# Patient Record
Sex: Male | Born: 1950 | Race: White | Hispanic: No | Marital: Married | Smoking: Former smoker
Health system: Southern US, Community
[De-identification: ages and names within clinical notes are randomized; demographics above are authoritative.]

## PROBLEM LIST (undated history)

## (undated) DIAGNOSIS — J439 Emphysema, unspecified: Secondary | ICD-10-CM

## (undated) DIAGNOSIS — I714 Abdominal aortic aneurysm, without rupture, unspecified: Secondary | ICD-10-CM

## (undated) DIAGNOSIS — H269 Unspecified cataract: Secondary | ICD-10-CM

## (undated) DIAGNOSIS — G610 Guillain-Barre syndrome: Secondary | ICD-10-CM

## (undated) HISTORY — PX: FOOT SURGERY: SHX648

## (undated) HISTORY — DX: Emphysema, unspecified: J43.9

## (undated) HISTORY — PX: KNEE ARTHROSCOPY: SHX127

## (undated) HISTORY — DX: Guillain-Barre syndrome: G61.0

## (undated) HISTORY — DX: Unspecified cataract: H26.9

## (undated) HISTORY — DX: Abdominal aortic aneurysm, without rupture, unspecified: I71.40

## (undated) HISTORY — PX: KNEE SURGERY: SHX244

## (undated) HISTORY — PX: WISDOM TOOTH EXTRACTION: SHX21

## (undated) HISTORY — PX: HERNIA REPAIR: SHX51

---

## 2020-09-01 ENCOUNTER — Other Ambulatory Visit: Payer: Self-pay

## 2020-09-02 ENCOUNTER — Ambulatory Visit (INDEPENDENT_AMBULATORY_CARE_PROVIDER_SITE_OTHER): Payer: Medicare PPO | Admitting: Family Medicine

## 2020-09-02 ENCOUNTER — Encounter: Payer: Self-pay | Admitting: Family Medicine

## 2020-09-02 VITALS — BP 144/86 | HR 69 | Temp 97.9°F | Ht 70.0 in | Wt 212.0 lb

## 2020-09-02 DIAGNOSIS — Z1211 Encounter for screening for malignant neoplasm of colon: Secondary | ICD-10-CM | POA: Insufficient documentation

## 2020-09-02 DIAGNOSIS — Z720 Tobacco use: Secondary | ICD-10-CM | POA: Diagnosis not present

## 2020-09-02 DIAGNOSIS — F17201 Nicotine dependence, unspecified, in remission: Secondary | ICD-10-CM | POA: Insufficient documentation

## 2020-09-02 DIAGNOSIS — Z0000 Encounter for general adult medical examination without abnormal findings: Secondary | ICD-10-CM

## 2020-09-02 DIAGNOSIS — Z23 Encounter for immunization: Secondary | ICD-10-CM | POA: Diagnosis not present

## 2020-09-02 DIAGNOSIS — R030 Elevated blood-pressure reading, without diagnosis of hypertension: Secondary | ICD-10-CM

## 2020-09-02 DIAGNOSIS — F17200 Nicotine dependence, unspecified, uncomplicated: Secondary | ICD-10-CM | POA: Insufficient documentation

## 2020-09-02 DIAGNOSIS — Z122 Encounter for screening for malignant neoplasm of respiratory organs: Secondary | ICD-10-CM

## 2020-09-02 LAB — URINALYSIS, ROUTINE W REFLEX MICROSCOPIC
Bilirubin Urine: NEGATIVE
Hgb urine dipstick: NEGATIVE
Ketones, ur: NEGATIVE
Leukocytes,Ua: NEGATIVE
Nitrite: NEGATIVE
RBC / HPF: NONE SEEN (ref 0–?)
Specific Gravity, Urine: 1.025 (ref 1.000–1.030)
Total Protein, Urine: NEGATIVE
Urine Glucose: NEGATIVE
Urobilinogen, UA: 0.2 (ref 0.0–1.0)
pH: 6 (ref 5.0–8.0)

## 2020-09-02 LAB — LIPID PANEL
Cholesterol: 185 mg/dL (ref 0–200)
HDL: 32.3 mg/dL — ABNORMAL LOW (ref >39.00)
NonHDL: 153.09
Total CHOL/HDL Ratio: 6
Triglycerides: 301 mg/dL — ABNORMAL HIGH (ref 0.0–149.0)
VLDL: 60.2 mg/dL — ABNORMAL HIGH (ref 0.0–40.0)

## 2020-09-02 LAB — COMPREHENSIVE METABOLIC PANEL
ALT: 29 U/L (ref 0–53)
AST: 18 U/L (ref 0–37)
Albumin: 4.4 g/dL (ref 3.5–5.2)
Alkaline Phosphatase: 51 U/L (ref 39–117)
BUN: 18 mg/dL (ref 6–23)
CO2: 25 mEq/L (ref 19–32)
Calcium: 9.2 mg/dL (ref 8.4–10.5)
Chloride: 103 mEq/L (ref 96–112)
Creatinine, Ser: 0.85 mg/dL (ref 0.40–1.50)
GFR: 88.52 mL/min (ref >60.00)
Glucose, Bld: 107 mg/dL — ABNORMAL HIGH (ref 70–99)
Potassium: 4.5 mEq/L (ref 3.5–5.1)
Sodium: 136 mEq/L (ref 135–145)
Total Bilirubin: 0.7 mg/dL (ref 0.2–1.2)
Total Protein: 6.6 g/dL (ref 6.0–8.3)

## 2020-09-02 LAB — CBC
HCT: 47.5 % (ref 39.0–52.0)
Hemoglobin: 16.1 g/dL (ref 13.0–17.0)
MCHC: 33.9 g/dL (ref 30.0–36.0)
MCV: 97.2 fl (ref 78.0–100.0)
Platelets: 231 10*3/uL (ref 150.0–400.0)
RBC: 4.89 Mil/uL (ref 4.22–5.81)
RDW: 13.5 % (ref 11.5–15.5)
WBC: 7.2 10*3/uL (ref 4.0–10.5)

## 2020-09-02 LAB — LDL CHOLESTEROL, DIRECT: Direct LDL: 104 mg/dL

## 2020-09-02 LAB — PSA: PSA: 0.8 ng/mL (ref 0.10–4.00)

## 2020-09-02 NOTE — Patient Instructions (Addendum)
Coping with Quitting Smoking  Quitting smoking is a physical and mental challenge. You will face cravings, withdrawal symptoms, and temptation. Before quitting, work with your health care provider to make a plan that can help you cope. Preparation can help you quit and keep you from giving in. How can I cope with cravings? Cravings usually last for 5-10 minutes. If you get through it, the craving will pass. Consider taking the following actions to help you cope with cravings:  Keep your mouth busy: ? Chew sugar-free gum. ? Suck on hard candies or a straw. ? Brush your teeth.  Keep your hands and body busy: ? Immediately change to a different activity when you feel a craving. ? Squeeze or play with a ball. ? Do an activity or a hobby, like making bead jewelry, practicing needlepoint, or working with wood. ? Mix up your normal routine. ? Take a short exercise break. Go for a quick walk or run up and down stairs. ? Spend time in public places where smoking is not allowed.  Focus on doing something kind or helpful for someone else.  Call a friend or family member to talk during a craving.  Join a support group.  Call a quit line, such as 1-800-QUIT-NOW.  Talk with your health care provider about medicines that might help you cope with cravings and make quitting easier for you. How can I deal with withdrawal symptoms? Your body may experience negative effects as it tries to get used to not having nicotine in the system. These effects are called withdrawal symptoms. They may include:  Feeling hungrier than normal.  Trouble concentrating.  Irritability.  Trouble sleeping.  Feeling depressed.  Restlessness and agitation.  Craving a cigarette. To manage withdrawal symptoms:  Avoid places, people, and activities that trigger your cravings.  Remember why you want to quit.  Get plenty of sleep.  Avoid coffee and other caffeinated drinks. These may worsen some of your  symptoms. How can I handle social situations? Social situations can be difficult when you are quitting smoking, especially in the first few weeks. To manage this, you can:  Avoid parties, bars, and other social situations where people might be smoking.  Avoid alcohol.  Leave right away if you have the urge to smoke.  Explain to your family and friends that you are quitting smoking. Ask for understanding and support.  Plan activities with friends or family where smoking is not an option. What are some ways I can cope with stress? Wanting to smoke may cause stress, and stress can make you want to smoke. Find ways to manage your stress. Relaxation techniques can help. For example:  Breathe slowly and deeply, in through your nose and out through your mouth.  Listen to soothing, relaxing music.  Talk with a family member or friend about your stress.  Light a candle.  Soak in a bath or take a shower.  Think about a peaceful place. What are some ways I can prevent weight gain? Be aware that many people gain weight after they quit smoking. However, not everyone does. To keep from gaining weight, have a plan in place before you quit and stick to the plan after you quit. Your plan should include:  Having healthy snacks. When you have a craving, it may help to: ? Eat plain popcorn, crunchy carrots, celery, or other cut vegetables. ? Chew sugar-free gum.  Changing how you eat: ? Eat small portion sizes at meals. ? Eat 4-6 small meals  throughout the day instead of 1-2 large meals a day. ? Be mindful when you eat. Do not watch television or do other things that might distract you as you eat.  Exercising regularly: ? Make time to exercise each day. If you do not have time for a long workout, do short bouts of exercise for 5-10 minutes several times a day. ? Do some form of strengthening exercise, like weight lifting, and some form of aerobic exercise, like running or swimming.  Drinking  plenty of water or other low-calorie or no-calorie drinks. Drink 6-8 glasses of water daily, or as much as instructed by your health care provider. Summary  Quitting smoking is a physical and mental challenge. You will face cravings, withdrawal symptoms, and temptation to smoke again. Preparation can help you as you go through these challenges.  You can cope with cravings by keeping your mouth busy (such as by chewing gum), keeping your body and hands busy, and making calls to family, friends, or a helpline for people who want to quit smoking.  You can cope with withdrawal symptoms by avoiding places where people smoke, avoiding drinks with caffeine, and getting plenty of rest.  Ask your health care provider about the different ways to prevent weight gain, avoid stress, and handle social situations. This information is not intended to replace advice given to you by your health care provider. Make sure you discuss any questions you have with your health care provider. Document Revised: 10/11/2017 Document Reviewed: 10/26/2016 Elsevier Patient Education  2020 [employer].  Health Maintenance After Age 71 After age 22, you are at a higher risk for certain long-term diseases and infections as well as injuries from falls. Falls are a major cause of broken bones and head injuries in people who are older than age 14. Getting regular preventive care can help to keep you healthy and well. Preventive care includes getting regular testing and making lifestyle changes as recommended by your health care provider. Talk with your health care provider about:  Which screenings and tests you should have. A screening is a test that checks for a disease when you have no symptoms.  A diet and exercise plan that is right for you. What should I know about screenings and tests to prevent falls? Screening and testing are the best ways to find a health problem early. Early diagnosis and treatment give you the best  chance of managing medical conditions that are common after age 3. Certain conditions and lifestyle choices may make you more likely to have a fall. Your health care provider may recommend:  Regular vision checks. Poor vision and conditions such as cataracts can make you more likely to have a fall. If you wear glasses, make sure to get your prescription updated if your vision changes.  Medicine review. Work with your health care provider to regularly review all of the medicines you are taking, including over-the-counter medicines. Ask your health care provider about any side effects that may make you more likely to have a fall. Tell your health care provider if any medicines that you take make you feel dizzy or sleepy.  Osteoporosis screening. Osteoporosis is a condition that causes the bones to get weaker. This can make the bones weak and cause them to break more easily.  Blood pressure screening. Blood pressure changes and medicines to control blood pressure can make you feel dizzy.  Strength and balance checks. Your health care provider may recommend certain tests to check your strength and  balance while standing, walking, or changing positions.  Foot health exam. Foot pain and numbness, as well as not wearing proper footwear, can make you more likely to have a fall.  Depression screening. You may be more likely to have a fall if you have a fear of falling, feel emotionally low, or feel unable to do activities that you used to do.  Alcohol use screening. Using too much alcohol can affect your balance and may make you more likely to have a fall. What actions can I take to lower my risk of falls? General instructions  Talk with your health care provider about your risks for falling. Tell your health care provider if: ? You fall. Be sure to tell your health care provider about all falls, even ones that seem minor. ? You feel dizzy, sleepy, or off-balance.  Take over-the-counter and  prescription medicines only as told by your health care provider. These include any supplements.  Eat a healthy diet and maintain a healthy weight. A healthy diet includes low-fat dairy products, low-fat (lean) meats, and fiber from whole grains, beans, and lots of fruits and vegetables. Home safety  Remove any tripping hazards, such as rugs, cords, and clutter.  Install safety equipment such as grab bars in bathrooms and safety rails on stairs.  Keep rooms and walkways well-lit. Activity   Follow a regular exercise program to stay fit. This will help you maintain your balance. Ask your health care provider what types of exercise are appropriate for you.  If you need a cane or walker, use it as recommended by your health care provider.  Wear supportive shoes that have nonskid soles. Lifestyle  Do not drink alcohol if your health care provider tells you not to drink.  If you drink alcohol, limit how much you have: ? 0-1 drink a day for women. ? 0-2 drinks a day for men.  Be aware of how much alcohol is in your drink. In the [place] one drink equals one typical bottle of beer (12 oz), one-half glass of wine (5 oz), or one shot of hard liquor (1 oz).  Do not use any products that contain nicotine or tobacco, such as cigarettes and e-cigarettes. If you need help quitting, ask your health care provider. Summary  Having a healthy lifestyle and getting preventive care can help to protect your health and wellness after age 59.  Screening and testing are the best way to find a health problem early and help you avoid having a fall. Early diagnosis and treatment give you the best chance for managing medical conditions that are more common for people who are older than age 72.  Falls are a major cause of broken bones and head injuries in people who are older than age 67. Take precautions to prevent a fall at home.  Work with your health care provider to learn what changes you can make to  improve your health and wellness and to prevent falls. This information is not intended to replace advice given to you by your health care provider. Make sure you discuss any questions you have with your health care provider. Document Revised: 02/19/2019 Document Reviewed: 09/11/2017 Elsevier Patient Education  [employer].  Preventing Hypertension Hypertension, commonly called high blood pressure, is when the force of blood pumping through the arteries is too strong. Arteries are blood vessels that carry blood from the heart throughout the body. Over time, hypertension can damage the arteries and decrease blood flow to important parts of  the body, including the brain, heart, and kidneys. Often, hypertension does not cause symptoms until blood pressure is very high. For this reason, it is important to have your blood pressure checked on a regular basis. Hypertension can often be prevented with diet and lifestyle changes. If you already have hypertension, you can control it with diet and lifestyle changes, as well as medicine. What nutrition changes can be made? Maintain a healthy diet. This includes:  Eating less salt (sodium). Ask your health care provider how much sodium is safe for you to have. The general recommendation is to consume less than 1 tsp (2,300 mg) of sodium a day. ? Do not add salt to your food. ? Choose low-sodium options when grocery shopping and eating out.  Limiting fats in your diet. You can do this by eating low-fat or fat-free dairy products and by eating less red meat.  Eating more fruits, vegetables, and whole grains. Make a goal to eat: ? 1-2 cups of fresh fruits and vegetables each day. ? 3-4 servings of whole grains each day.  Avoiding foods and beverages that have added sugars.  Eating fish that contain healthy fats (omega-3 fatty acids), such as mackerel or salmon. If you need help putting together a healthy eating plan, try the DASH diet. This diet is  high in fruits, vegetables, and whole grains. It is low in sodium, red meat, and added sugars. DASH stands for Dietary Approaches to Stop Hypertension. What lifestyle changes can be made?   Lose weight if you are overweight. Losing just 3?5% of your body weight can help prevent or control hypertension. ? For example, if your present weight is 200 lb (91 kg), a loss of 3-5% of your weight means losing 6-10 lb (2.7-4.5 kg). ? Ask your health care provider to help you with a diet and exercise plan to safely lose weight.  Get enough exercise. Do at least 150 minutes of moderate-intensity exercise each week. ? You could do this in short exercise sessions several times a day, or you could do longer exercise sessions a few times a week. For example, you could take a brisk 10-minute walk or bike ride, 3 times a day, for 5 days a week.  Find ways to reduce stress, such as exercising, meditating, listening to music, or taking a yoga class. If you need help reducing stress, ask your health care provider.  Do not smoke. This includes e-cigarettes. Chemicals in tobacco and nicotine products raise your blood pressure each time you smoke. If you need help quitting, ask your health care provider.  Avoid alcohol. If you drink alcohol, limit alcohol intake to no more than 1 drink a day for nonpregnant women and 2 drinks a day for men. One drink equals 12 oz of beer, 5 oz of wine, or 1 oz of hard liquor. Why are these changes important? Diet and lifestyle changes can help you prevent hypertension, and they may make you feel better overall and improve your quality of life. If you have hypertension, making these changes will help you control it and help prevent major complications, such as:  Hardening and narrowing of arteries that supply blood to: ? Your heart. This can cause a heart attack. ? Your brain. This can cause a stroke. ? Your kidneys. This can cause kidney failure.  Stress on your heart muscle, which  can cause heart failure. What can I do to lower my risk?  Work with your health care provider to make a hypertension prevention  plan that works for you. Follow your plan and keep all follow-up visits as told by your health care provider.  Learn how to check your blood pressure at home. Make sure that you know your personal target blood pressure, as told by your health care provider. How is this treated? In addition to diet and lifestyle changes, your health care provider may recommend medicines to help lower your blood pressure. You may need to try a few different medicines to find what works best for you. You also may need to take more than one medicine. Take over-the-counter and prescription medicines only as told by your health care provider. Where to find support Your health care provider can help you prevent hypertension and help you keep your blood pressure at a healthy level. Your local hospital or your community may also provide support services and prevention programs. The American Heart Association offers an online support network at: CheapBootlegs.com.cy Where to find more information Learn more about hypertension from:  [place], [facility]: ElectronicHangman.is  Centers for Disease Control and Prevention: https://ingram.com/  American Academy of Family Physicians: http://familydoctor.org/familydoctor/en/diseases-conditions/high-blood-pressure.printerview.all.html Learn more about the DASH diet from:  [place], and [place]: https://www.reyes.com/ Contact a health care provider if:  You think you are having a reaction to medicines you have taken.  You have recurrent headaches or feel dizzy.  You have swelling in your ankles.  You have trouble with your vision. Summary  Hypertension often does not cause any symptoms until blood pressure is  very high. It is important to get your blood pressure checked regularly.  Diet and lifestyle changes are the most important steps in preventing hypertension.  By keeping your blood pressure in a healthy range, you can prevent complications like heart attack, heart failure, stroke, and kidney failure.  Work with your health care provider to make a hypertension prevention plan that works for you. This information is not intended to replace advice given to you by your health care provider. Make sure you discuss any questions you have with your health care provider. Document Revised: 02/20/2019 Document Reviewed: 07/09/2016 Elsevier Patient Education  [place] 65 Years and Older, Male Preventive care refers to lifestyle choices and visits with your health care provider that can promote health and wellness. This includes:  A yearly physical exam. This is also called an annual well check.  Regular dental and eye exams.  Immunizations.  Screening for certain conditions.  Healthy lifestyle choices, such as diet and exercise. What can I expect for my preventive care visit? Physical exam Your health care provider will check:  Height and weight. These may be used to calculate body mass index (BMI), which is a measurement that tells if you are at a healthy weight.  Heart rate and blood pressure.  Your skin for abnormal spots. Counseling Your health care provider may ask you questions about:  Alcohol, tobacco, and drug use.  Emotional well-being.  Home and relationship well-being.  Sexual activity.  Eating habits.  History of falls.  Memory and ability to understand (cognition).  Work and work Statistician. What immunizations do I need?  Influenza (flu) vaccine  This is recommended every year. Tetanus, diphtheria, and pertussis (Tdap) vaccine  You may need a Td booster every 10 years. Varicella (chickenpox) vaccine  You may need this vaccine  if you have not already been vaccinated. Zoster (shingles) vaccine  You may need this after age 48. Pneumococcal conjugate (PCV13) vaccine  One  dose is recommended after age 38. Pneumococcal polysaccharide (PPSV23) vaccine  One dose is recommended after age 21. Measles, mumps, and rubella (MMR) vaccine  You may need at least one dose of MMR if you were born in 1957 or later. You may also need a second dose. Meningococcal conjugate (MenACWY) vaccine  You may need this if you have certain conditions. Hepatitis A vaccine  You may need this if you have certain conditions or if you travel or work in places where you may be exposed to hepatitis A. Hepatitis B vaccine  You may need this if you have certain conditions or if you travel or work in places where you may be exposed to hepatitis B. Haemophilus influenzae type b (Hib) vaccine  You may need this if you have certain conditions. You may receive vaccines as individual doses or as more than one vaccine together in one shot (combination vaccines). Talk with your health care provider about the risks and benefits of combination vaccines. What tests do I need? Blood tests  Lipid and cholesterol levels. These may be checked every 5 years, or more frequently depending on your overall health.  Hepatitis C test.  Hepatitis B test. Screening  Lung cancer screening. You may have this screening every year starting at age 68 if you have a 30-pack-year history of smoking and currently smoke or have quit within the past 15 years.  Colorectal cancer screening. All adults should have this screening starting at age 34 and continuing until age 89. Your health care provider may recommend screening at age 99 if you are at increased risk. You will have tests every 1-10 years, depending on your results and the type of screening test.  Prostate cancer screening. Recommendations will vary depending on your family history and other risks.  Diabetes  screening. This is done by checking your blood sugar (glucose) after you have not eaten for a while (fasting). You may have this done every 1-3 years.  Abdominal aortic aneurysm (AAA) screening. You may need this if you are a current or former smoker.  Sexually transmitted disease (STD) testing. Follow these instructions at home: Eating and drinking  Eat a diet that includes fresh fruits and vegetables, whole grains, lean protein, and low-fat dairy products. Limit your intake of foods with high amounts of sugar, saturated fats, and salt.  Take vitamin and mineral supplements as recommended by your health care provider.  Do not drink alcohol if your health care provider tells you not to drink.  If you drink alcohol: ? Limit how much you have to 0-2 drinks a day. ? Be aware of how much alcohol is in your drink. In the [place] one drink equals one 12 oz bottle of beer (355 mL), one 5 oz glass of wine (148 mL), or one 1 oz glass of hard liquor (44 mL). Lifestyle  Take daily care of your teeth and gums.  Stay active. Exercise for at least 30 minutes on 5 or more days each week.  Do not use any products that contain nicotine or tobacco, such as cigarettes, e-cigarettes, and chewing tobacco. If you need help quitting, ask your health care provider.  If you are sexually active, practice safe sex. Use a condom or other form of protection to prevent STIs (sexually transmitted infections).  Talk with your health care provider about taking a low-dose aspirin or statin. What's next?  Visit your health care provider once a year for a well check visit.  Ask your health care  provider how often you should have your eyes and teeth checked.  Stay up to date on all vaccines. This information is not intended to replace advice given to you by your health care provider. Make sure you discuss any questions you have with your health care provider. Document Revised: 10/23/2018 Document Reviewed:  10/23/2018 Elsevier Patient Education  2020 [employer].

## 2020-09-02 NOTE — Progress Notes (Signed)
New Patient Office Visit  Subjective:  Patient ID: Joe Greene, male    DOB: October 04, 1951  Age: 69 y.o. MRN: [id number]  CC:  Chief Complaint  Patient presents with  . Establish Care    New patient, CPE no concerns.     HPI Joe Greene presents for establishment of care and follow-up on health maintenance.  He is smoked for all his adult life.  He has tried to quit but has been unable to do so.  He has never had a colonoscopy.  He has no history of hypertension.  Urine flow is okay but does experience nocturia x1.  No problems moving bowels.  He and his wife recently moved into the area from [place].  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  History reviewed. No pertinent past medical history.  Past Surgical History:  Procedure Laterality Date  . FOOT SURGERY Right   . HERNIA REPAIR    . KNEE SURGERY      History reviewed. No pertinent family history.  Social History   Socioeconomic History  . Marital status: Married    Spouse name: Not on file  . Number of children: Not on file  . Years of education: Not on file  . Highest education level: Not on file  Occupational History  . Not on file  Tobacco Use  . Smoking status: Current Every Day Smoker  . Smokeless tobacco: Never Used  Vaping Use  . Vaping Use: Never used  Substance and Sexual Activity  . Alcohol use: Yes    Alcohol/week: 1.0 standard drink    Types: 1 Standard drinks or equivalent per week    Comment: occ  . Drug use: Never  . Sexual activity: Yes  Other Topics Concern  . Not on file  Social History Narrative  . Not on file   Social Determinants of Health   Financial Resource Strain:   . Difficulty of Paying Living Expenses: Not on file  Food Insecurity:   . Worried About Programme researcher, broadcasting/film/video in the Last Year: Not on file  . Ran Out of Food in the Last Year: Not on file  Transportation Needs:   . Lack of Transportation (Medical): Not on file  . Lack of Transportation (Non-Medical): Not on  file  Physical Activity:   . Days of Exercise per Week: Not on file  . Minutes of Exercise per Session: Not on file  Stress:   . Feeling of Stress : Not on file  Social Connections:   . Frequency of Communication with Friends and Family: Not on file  . Frequency of Social Gatherings with Friends and Family: Not on file  . Attends Religious Services: Not on file  . Active Member of Clubs or Organizations: Not on file  . Attends Banker Meetings: Not on file  . Marital Status: Not on file  Intimate Partner Violence:   . Fear of Current or Ex-Partner: Not on file  . Emotionally Abused: Not on file  . Physically Abused: Not on file  . Sexually Abused: Not on file    ROS Review of Systems  Constitutional: Negative.   HENT: Negative.   Eyes: Negative for photophobia and visual disturbance.  Respiratory: Negative for cough and shortness of breath.   Cardiovascular: Negative.   Gastrointestinal: Negative for anal bleeding and blood in stool.  Endocrine: Negative for polyphagia and polyuria.  Genitourinary: Negative for difficulty urinating, frequency and urgency.  Musculoskeletal: Negative for gait  problem and joint swelling.  Skin: Negative for pallor.  Allergic/Immunologic: Negative for immunocompromised state.  Neurological: Negative for speech difficulty and light-headedness.  Hematological: Does not bruise/bleed easily.  Psychiatric/Behavioral: Negative.     Objective:   Today's Vitals: BP (!) 144/86   Pulse 69   Temp 97.9 F (36.6 C) (Tympanic)   Ht 5\' 10"  (1.778 m)   Wt 212 lb (96.2 kg)   SpO2 94%   BMI 30.42 kg/m   Physical Exam Vitals and nursing note reviewed.  Constitutional:      General: He is not in acute distress.    Appearance: Normal appearance. He is not ill-appearing, toxic-appearing or diaphoretic.  HENT:     Head: Normocephalic and atraumatic.     Right Ear: Tympanic membrane, ear canal and external ear normal.     Left Ear: Tympanic  membrane, ear canal and external ear normal.     Mouth/Throat:     Mouth: Mucous membranes are moist.     Pharynx: Oropharynx is clear. No oropharyngeal exudate or posterior oropharyngeal erythema.  Eyes:     General: No scleral icterus.       Right eye: No discharge.        Left eye: No discharge.     Extraocular Movements: Extraocular movements intact.     Conjunctiva/sclera: Conjunctivae normal.     Pupils: Pupils are equal, round, and reactive to light.  Cardiovascular:     Rate and Rhythm: Normal rate and regular rhythm.  Pulmonary:     Effort: Pulmonary effort is normal. No respiratory distress.     Breath sounds: Normal breath sounds. No stridor. No wheezing or rhonchi.  Abdominal:     General: Bowel sounds are normal. There is no distension.     Palpations: Abdomen is soft. There is no mass.     Tenderness: There is no abdominal tenderness. There is no guarding or rebound.     Hernia: No hernia is present. There is no hernia in the left inguinal area or right inguinal area.  Genitourinary:    Penis: Circumcised. No hypospadias, erythema, tenderness, discharge, swelling or lesions.      Testes:        Right: Mass, tenderness or swelling not present. Right testis is descended.        Left: Mass, tenderness or swelling not present. Left testis is descended.     Epididymis:     Right: Not inflamed or enlarged.     Left: Not inflamed or enlarged.     Prostate: Not enlarged, not tender and no nodules present.     Rectum: Guaiac result negative. No mass, tenderness, anal fissure, external hemorrhoid or internal hemorrhoid. Normal anal tone.  Musculoskeletal:     Cervical back: No rigidity or tenderness.     Right lower leg: No edema.     Left lower leg: No edema.  Lymphadenopathy:     Cervical: No cervical adenopathy.     Lower Body: No right inguinal adenopathy. No left inguinal adenopathy.  Skin:    General: Skin is warm and dry.     Coloration: Skin is not jaundiced.   Neurological:     Mental Status: He is alert and oriented to person, place, and time.  Psychiatric:        Mood and Affect: Mood normal.        Behavior: Behavior normal.     Assessment & Plan:   Problem List Items Addressed This Visit  Other   Screen for colon cancer   Relevant Orders   Ambulatory referral to Gastroenterology   Elevated BP without diagnosis of hypertension   Relevant Orders   CBC   Comprehensive metabolic panel   Urinalysis, Routine w reflex microscopic   Tobacco use   Relevant Orders   Ambulatory Referral for Lung Cancer Scre   Need for Tdap vaccination - Primary   Relevant Orders   Tdap vaccine greater than or equal to 7yo IM (Completed)      No outpatient encounter medications on file as of 09/02/2020.   No facility-administered encounter medications on file as of 09/02/2020.    Follow-up: Return in about 3 months (around 12/03/2020), or check and record blood pressures..   Patient was given information on health maintenance and disease prevention.  He was also given information on coping with quitting smoking and encouraged to do so.  Agrees to go for screening CT of his chest.  Given information on preventing high blood pressure and asked to check and record his blood pressures periodically over the next 3 months and follow-up.  Advised patient to fluid restrict 3 to 4 hours before retiring at night.  Discussed using tamsulosin and he would like to hold off for now.  Mliss Sax, MD

## 2020-09-12 ENCOUNTER — Telehealth: Payer: Self-pay | Admitting: Family Medicine

## 2020-09-12 NOTE — Telephone Encounter (Signed)
Left message for patient to schedule Annual Wellness Visit.  Please schedule with Nurse Health Advisor Martha Stanley, RN at [facility]  °

## 2020-09-15 ENCOUNTER — Ambulatory Visit (INDEPENDENT_AMBULATORY_CARE_PROVIDER_SITE_OTHER): Payer: Medicare PPO

## 2020-09-15 VITALS — Ht 70.0 in | Wt 212.0 lb

## 2020-09-15 DIAGNOSIS — Z1211 Encounter for screening for malignant neoplasm of colon: Secondary | ICD-10-CM | POA: Diagnosis not present

## 2020-09-15 DIAGNOSIS — Z122 Encounter for screening for malignant neoplasm of respiratory organs: Secondary | ICD-10-CM

## 2020-09-15 DIAGNOSIS — Z0000 Encounter for general adult medical examination without abnormal findings: Secondary | ICD-10-CM | POA: Diagnosis not present

## 2020-09-15 NOTE — Progress Notes (Signed)
Subjective:   Joe Greene is a 69 y.o. male who presents for an Initial Medicare Annual Wellness Visit.  I connected with Joe Greene today by telephone and verified that I am speaking with the correct person using two identifiers. Location patient: home  Location provider: work Persons participating in the virtual visit: patient, Engineer, civil (consulting).    I discussed the limitations, risks, security and privacy concerns of performing an evaluation and management service by telephone and the availability of in person appointments. I also discussed with the patient that there may be a patient responsible charge related to this service. The patient expressed understanding and verbally consented to this telephonic visit.    Interactive audio and video telecommunications were attempted between this provider and patient, however failed, due to patient having technical difficulties OR patient did not have access to video capability.  We continued and completed visit with audio only.  Some vital signs may be absent or patient reported.   Time Spent with patient on telephone encounter: 20 minutes  Review of Systems     Cardiac Risk Factors include: advanced age (>74men, >7 women);male gender;obesity (BMI >30kg/m2);smoking/ tobacco exposure     Objective:    Today's Vitals   09/15/20 1245  Weight: 212 lb (96.2 kg)  Height: 5\' 10"  (1.778 m)   Body mass index is 30.42 kg/m.  Advanced Directives 09/15/2020  Does Patient Have a Medical Advance Directive? Yes  Type of 13/02/2020 of [place];Living will  Copy of Healthcare Power of Attorney in Chart? No - copy requested    Current Medications (verified) No outpatient encounter medications on file as of 09/15/2020.   No facility-administered encounter medications on file as of 09/15/2020.    Allergies (verified) Patient has no allergy information on record.   History: History reviewed. No pertinent past medical history. Past Surgical  History:  Procedure Laterality Date  . FOOT SURGERY Right   . HERNIA REPAIR    . KNEE SURGERY     History reviewed. No pertinent family history. Social History   Socioeconomic History  . Marital status: Married    Spouse name: Not on file  . Number of children: Not on file  . Years of education: Not on file  . Highest education level: Not on file  Occupational History  . Not on file  Tobacco Use  . Smoking status: Current Every Day Smoker  . Smokeless tobacco: Never Used  Vaping Use  . Vaping Use: Never used  Substance and Sexual Activity  . Alcohol use: Yes    Alcohol/week: 1.0 standard drink    Types: 1 Standard drinks or equivalent per week    Comment: occ  . Drug use: Never  . Sexual activity: Yes  Other Topics Concern  . Not on file  Social History Narrative  . Not on file   Social Determinants of Health   Financial Resource Strain: Low Risk   . Difficulty of Paying Living Expenses: Not hard at all  Food Insecurity: No Food Insecurity  . Worried About 13/02/2020 in the Last Year: Never true  . Ran Out of Food in the Last Year: Never true  Transportation Needs: No Transportation Needs  . Lack of Transportation (Medical): No  . Lack of Transportation (Non-Medical): No  Physical Activity: Sufficiently Active  . Days of Exercise per Week: 7 days  . Minutes of Exercise per Session: 60 min  Stress: No Stress Concern Present  . Feeling of Stress : Not  at all  Social Connections: Socially Integrated  . Frequency of Communication with Friends and Family: More than three times a week  . Frequency of Social Gatherings with Friends and Family: Once a week  . Attends Religious Services: More than 4 times per year  . Active Member of Clubs or Organizations: Yes  . Attends Banker Meetings: 1 to 4 times per year  . Marital Status: Married    Tobacco Counseling Ready to quit: Not Answered Counseling given: Not Answered   Clinical  Intake:  Pre-visit preparation completed: Yes  Pain : No/denies pain     Nutritional Status: BMI > 30  Obese Nutritional Risks: None Diabetes: No  How often do you need to have someone help you when you read instructions, pamphlets, or other written materials from your doctor or pharmacy?: 1 - Never What is the last grade level you completed in school?: PHD  Diabetic?No Interpreter Needed?: No  Information entered by :: Joe Sales LPN   Activities of Daily Living In your present state of health, do you have any difficulty performing the following activities: 09/15/2020  Hearing? N  Vision? N  Difficulty concentrating or making decisions? N  Walking or climbing stairs? N  Dressing or bathing? N  Doing errands, shopping? N  Preparing Food and eating ? N  Using the Toilet? N  In the past six months, have you accidently leaked urine? N  Do you have problems with loss of bowel control? N  Managing your Medications? N  Managing your Finances? N  Housekeeping or managing your Housekeeping? N    Patient Care Team: Mliss Sax, MD as PCP - General (Family Medicine)  Indicate any recent Medical Services you may have received from other than [facility] providers in the past year (date may be approximate).     Assessment:   This is a routine wellness examination for Joe Greene.  Hearing/Vision screen  Hearing Screening   125Hz  250Hz  500Hz  1000Hz  2000Hz  3000Hz  4000Hz  6000Hz  8000Hz   Right ear:           Left ear:           Comments: Mild hearing loss  Vision Screening Comments: Wears glasses Last eye exam-08/2020- Dr.  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 Patient Stated     Lose some weight      Depression Screen PHQ 2/9 Scores 09/15/2020 09/02/2020 09/02/2020  PHQ - 2 Score 0 0 0  PHQ- 9 Score - - 0    Fall Risk Fall Risk  09/15/2020 09/02/2020  Falls in the past year? 0 0  Number falls in past yr: 0 -  Injury with Fall? 0 -    Any stairs in or around the home? Yes  If so, are there any without handrails? No  Home free of loose throw rugs in walkways, pet beds, electrical cords, etc? Yes  Adequate lighting in your home to reduce risk of falls? Yes   ASSISTIVE DEVICES UTILIZED TO PREVENT FALLS:  Life alert? No  Use of a cane, walker or w/c? No  Grab bars in the bathroom? Yes  Shower chair or bench in shower? No  Elevated toilet seat or a  handicapped toilet? No   TIMED UP AND GO:  Was the test performed? No . Phone visit   Cognitive Function:No cognitive impairment noted        Immunizations Immunization History  Administered Date(s) Administered  . Influenza Whole 08/09/2020  . PFIZER SARS-COV-2 Vaccination 12/14/2019, 01/09/2020, 08/14/2020  . Pneumococcal Polysaccharide-23 09/02/2020  . Tdap 09/02/2020    TDAP status: Up to date   Flu Vaccine status: Up to date   Pneumococcal vaccine status: Up to date   Covid-19 vaccine status: Completed vaccines  Qualifies for Shingles Vaccine? Yes   Zostavax completed No   Shingrix Completed?: No.    Education has been provided regarding the importance of this vaccine. Patient has been advised to call insurance company to determine out of pocket expense if they have not yet received this vaccine. Advised may also receive vaccine at local pharmacy or Health Dept. Verbalized acceptance and understanding.  Screening Tests Health Maintenance  Topic Date Due  . Hepatitis C Screening  Never done  . COLONOSCOPY  Never done  . PNA vac Low Risk Adult (2 of 2 - PCV13) 09/02/2021  . TETANUS/TDAP  09/02/2030  . INFLUENZA VACCINE  Completed  .  COVID-19 Vaccine  Completed    Health Maintenance  Health Maintenance Due  Topic Date Due  . Hepatitis C Screening  Never done  . COLONOSCOPY  Never done    Colorectal cancer screening: Referral to GI placed today. Pt aware the office will call re: appt.  Lung Cancer Screening: (Low Dose CT Chest recommended if Age 40-80 years, 30 pack-year currently smoking OR have quit w/in 15years.) does qualify.   Lung Cancer Screening Referral: placed today  Additional Screening:  Hepatitis C Screening: does qualify; Discuss with PCP  Vision Screening: Recommended annual ophthalmology exams for early detection of glaucoma and other disorders of the eye. Is the patient up to date with their annual eye exam?  Yes  Who is the provider or what is the name of the office in which the patient attends annual eye exams? Dr. Dan Humphreys   Dental Screening: Recommended annual dental exams for proper oral hygiene  Community Resource Referral / Chronic Care Management: CRR required this visit?  No   CCM required this visit?  No      Plan:     I have personally reviewed and noted the following in the patient's chart:   . Medical and social history . Use of alcohol, tobacco or illicit drugs  . Current medications and supplements . Functional ability and status . Nutritional status . Physical activity . Advanced directives . List of other physicians . Hospitalizations, surgeries, and ER visits in previous 12 months . Vitals . Screenings to include cognitive, depression, and falls . Referrals and appointments  In addition, I have reviewed and discussed with patient certain preventive protocols, quality metrics, and best practice recommendations. A written personalized care plan for preventive services as well as general preventive health recommendations were provided to patient.   Due to this being a telephonic visit, the after visit summary with patients personalized plan was offered to patient  via mail or my-chart. per request, patient was mailed a copy of AVS.   Roanna Raider, LPN   [id number]/07/[id number]  Nurse Health Advisor  Nurse Notes: None

## 2020-09-15 NOTE — Patient Instructions (Signed)
[name] , Thank you for taking time to complete your Medicare Wellness Visit. I appreciate your ongoing commitment to your health goals. Please review the following plan we discussed and let me know if I can assist you in the future.   Screening recommendations/referrals: Colonoscopy: Referral ordered today. Someone will be calling you to schedule Recommended yearly ophthalmology/optometry visit for glaucoma screening and checkup Recommended yearly dental visit for hygiene and checkup  Vaccinations: Influenza vaccine: Up to date Pneumococcal vaccine:Up to date Prevnar-13 due 09/02/21 Tdap vaccine: Up to date- Due-09/02/2030 Shingles vaccine: Discuss with pharmacy  Covid-19: Completed vaccines  Advanced directives: Please bring a copy for your chart  Conditions/risks identified: See problem list  Next appointment: Follow up in one year for your annual wellness visit. 09/19/2021 @ 1:30pm  Preventive Care 65 Years and Older, Male Preventive care refers to lifestyle choices and visits with your health care provider that can promote health and wellness. What does preventive care include?  A yearly physical exam. This is also called an annual well check.  Dental exams once or twice a year.  Routine eye exams. Ask your health care provider how often you should have your eyes checked.  Personal lifestyle choices, including:  Daily care of your teeth and gums.  Regular physical activity.  Eating a healthy diet.  Avoiding tobacco and drug use.  Limiting alcohol use.  Practicing safe sex.  Taking low doses of aspirin every day.  Taking vitamin and mineral supplements as recommended by your health care provider. What happens during an annual well check? The services and screenings done by your health care provider during your annual well check will depend on your age, overall health, lifestyle risk factors, and family history of disease. Counseling  Your health care provider  may ask you questions about your:  Alcohol use.  Tobacco use.  Drug use.  Emotional well-being.  Home and relationship well-being.  Sexual activity.  Eating habits.  History of falls.  Memory and ability to understand (cognition).  Work and work Astronomer. Screening  You may have the following tests or measurements:  Height, weight, and BMI.  Blood pressure.  Lipid and cholesterol levels. These may be checked every 5 years, or more frequently if you are over 59 years old.  Skin check.  Lung cancer screening. You may have this screening every year starting at age 32 if you have a 30-pack-year history of smoking and currently smoke or have quit within the past 15 years.  Fecal occult blood test (FOBT) of the stool. You may have this test every year starting at age 81.  Flexible sigmoidoscopy or colonoscopy. You may have a sigmoidoscopy every 5 years or a colonoscopy every 10 years starting at age 92.  Prostate cancer screening. Recommendations will vary depending on your family history and other risks.  Hepatitis C blood test.  Hepatitis B blood test.  Sexually transmitted disease (STD) testing.  Diabetes screening. This is done by checking your blood sugar (glucose) after you have not eaten for a while (fasting). You may have this done every 1-3 years.  Abdominal aortic aneurysm (AAA) screening. You may need this if you are a current or former smoker.  Osteoporosis. You may be screened starting at age 29 if you are at high risk. Talk with your health care provider about your test results, treatment options, and if necessary, the need for more tests. Vaccines  Your health care provider may recommend certain vaccines, such as:  Influenza vaccine.  This is recommended every year.  Tetanus, diphtheria, and acellular pertussis (Tdap, Td) vaccine. You may need a Td booster every 10 years.  Zoster vaccine. You may need this after age 58.  Pneumococcal 13-valent  conjugate (PCV13) vaccine. One dose is recommended after age 4.  Pneumococcal polysaccharide (PPSV23) vaccine. One dose is recommended after age 62. Talk to your health care provider about which screenings and vaccines you need and how often you need them. This information is not intended to replace advice given to you by your health care provider. Make sure you discuss any questions you have with your health care provider. Document Released: 11/25/2015 Document Revised: 07/18/2016 Document Reviewed: 08/30/2015 Elsevier Interactive Patient Education  2017 [employer] Prevention in the Home Falls can cause injuries. They can happen to people of all ages. There are many things you can do to make your home safe and to help prevent falls. What can I do on the outside of my home?  Regularly fix the edges of walkways and driveways and fix any cracks.  Remove anything that might make you trip as you walk through a door, such as a raised step or threshold.  Trim any bushes or trees on the path to your home.  Use bright outdoor lighting.  Clear any walking paths of anything that might make someone trip, such as rocks or tools.  Regularly check to see if handrails are loose or broken. Make sure that both sides of any steps have handrails.  Any raised decks and porches should have guardrails on the edges.  Have any leaves, snow, or ice cleared regularly.  Use sand or salt on walking paths during winter.  Clean up any spills in your garage right away. This includes oil or grease spills. What can I do in the bathroom?  Use night lights.  Install grab bars by the toilet and in the tub and shower. Do not use towel bars as grab bars.  Use non-skid mats or decals in the tub or shower.  If you need to sit down in the shower, use a plastic, non-slip stool.  Keep the floor dry. Clean up any water that spills on the floor as soon as it happens.  Remove soap buildup in the tub or  shower regularly.  Attach bath mats securely with double-sided non-slip rug tape.  Do not have throw rugs and other things on the floor that can make you trip. What can I do in the bedroom?  Use night lights.  Make sure that you have a light by your bed that is easy to reach.  Do not use any sheets or blankets that are too big for your bed. They should not hang down onto the floor.  Have a firm chair that has side arms. You can use this for support while you get dressed.  Do not have throw rugs and other things on the floor that can make you trip. What can I do in the kitchen?  Clean up any spills right away.  Avoid walking on wet floors.  Keep items that you use a lot in easy-to-reach places.  If you need to reach something above you, use a strong step stool that has a grab bar.  Keep electrical cords out of the way.  Do not use floor polish or wax that makes floors slippery. If you must use wax, use non-skid floor wax.  Do not have throw rugs and other things on the floor that can make  you trip. What can I do with my stairs?  Do not leave any items on the stairs.  Make sure that there are handrails on both sides of the stairs and use them. Fix handrails that are broken or loose. Make sure that handrails are as long as the stairways.  Check any carpeting to make sure that it is firmly attached to the stairs. Fix any carpet that is loose or worn.  Avoid having throw rugs at the top or bottom of the stairs. If you do have throw rugs, attach them to the floor with carpet tape.  Make sure that you have a light switch at the top of the stairs and the bottom of the stairs. If you do not have them, ask someone to add them for you. What else can I do to help prevent falls?  Wear shoes that:  Do not have high heels.  Have rubber bottoms.  Are comfortable and fit you well.  Are closed at the toe. Do not wear sandals.  If you use a stepladder:  Make sure that it is fully  opened. Do not climb a closed stepladder.  Make sure that both sides of the stepladder are locked into place.  Ask someone to hold it for you, if possible.  Clearly mark and make sure that you can see:  Any grab bars or handrails.  First and last steps.  Where the edge of each step is.  Use tools that help you move around (mobility aids) if they are needed. These include:  Canes.  Walkers.  Scooters.  Crutches.  Turn on the lights when you go into a dark area. Replace any light bulbs as soon as they burn out.  Set up your furniture so you have a clear path. Avoid moving your furniture around.  If any of your floors are uneven, fix them.  If there are any pets around you, be aware of where they are.  Review your medicines with your doctor. Some medicines can make you feel dizzy. This can increase your chance of falling. Ask your doctor what other things that you can do to help prevent falls. This information is not intended to replace advice given to you by your health care provider. Make sure you discuss any questions you have with your health care provider. Document Released: 08/25/2009 Document Revised: 04/05/2016 Document Reviewed: 12/03/2014 Elsevier Interactive Patient Education  2017 [employer].

## 2020-10-10 ENCOUNTER — Other Ambulatory Visit: Payer: Self-pay | Admitting: *Deleted

## 2020-10-10 DIAGNOSIS — F17210 Nicotine dependence, cigarettes, uncomplicated: Secondary | ICD-10-CM

## 2020-10-10 DIAGNOSIS — Z87891 Personal history of nicotine dependence: Secondary | ICD-10-CM

## 2020-10-10 NOTE — Progress Notes (Signed)
Chest  

## 2020-11-16 ENCOUNTER — Other Ambulatory Visit: Payer: Self-pay

## 2020-11-16 ENCOUNTER — Ambulatory Visit
Admission: RE | Admit: 2020-11-16 | Discharge: 2020-11-16 | Disposition: A | Discharge status: Home / Self Care | Payer: Medicare PPO | Source: Ambulatory Visit | Attending: Acute Care | Admitting: Acute Care

## 2020-11-16 ENCOUNTER — Ambulatory Visit (INDEPENDENT_AMBULATORY_CARE_PROVIDER_SITE_OTHER): Payer: Medicare PPO | Admitting: Acute Care

## 2020-11-16 ENCOUNTER — Encounter: Payer: Self-pay | Admitting: Acute Care

## 2020-11-16 VITALS — BP 118/70 | HR 67 | Temp 98.8°F | Ht 70.0 in | Wt 212.4 lb

## 2020-11-16 DIAGNOSIS — I2510 Atherosclerotic heart disease of native coronary artery without angina pectoris: Secondary | ICD-10-CM | POA: Diagnosis not present

## 2020-11-16 DIAGNOSIS — Z87891 Personal history of nicotine dependence: Secondary | ICD-10-CM

## 2020-11-16 DIAGNOSIS — F17210 Nicotine dependence, cigarettes, uncomplicated: Secondary | ICD-10-CM

## 2020-11-16 DIAGNOSIS — I700 Atherosclerosis of aorta: Secondary | ICD-10-CM | POA: Diagnosis not present

## 2020-11-16 DIAGNOSIS — Z122 Encounter for screening for malignant neoplasm of respiratory organs: Secondary | ICD-10-CM

## 2020-11-16 DIAGNOSIS — J432 Centrilobular emphysema: Secondary | ICD-10-CM | POA: Diagnosis not present

## 2020-11-16 NOTE — Patient Instructions (Signed)
Thank you for participating in the [facility]. It was our pleasure to meet you today. We will call you with the results of your scan within the next few days. Your scan will be assigned a Lung RADS category score by the physicians reading the scans.  This Lung RADS score determines follow up scanning.  See below for description of categories, and follow up screening recommendations. We will be in touch to schedule your follow up screening annually or based on recommendations of our providers. We will fax a copy of your scan results to your Primary Care Physician, or the physician who referred you to the program, to ensure they have the results. Please call the office if you have any questions or concerns regarding your scanning experience or results.  Our office number is [phone]. Please speak with Denise Phelps, RN. She is our Lung Cancer Screening RN. If she is unavailable when you call, please have the office staff send her a message. She will return your call at her earliest convenience. Remember, if your scan is normal, we will scan you annually as long as you continue to meet the criteria for the program. (Age 55-77, Current smoker or smoker who has quit within the last 15 years). If you are a smoker, remember, quitting is the single most powerful action that you can take to decrease your risk of lung cancer and other pulmonary, breathing related problems. We know quitting is hard, and we are here to help.  Please let us know if there is anything we can do to help you meet your goal of quitting. If you are a former smoker, congratulations. We are proud of you! Remain smoke free! Remember you can refer friends or family members through the number above.  We will screen them to make sure they meet criteria for the program. Thank you for helping us take better care of you by participating in Lung Screening.  Lung RADS Categories:  Lung RADS 1: no nodules  or definitely non-concerning nodules.  Recommendation is for a repeat annual scan in 12 months.  Lung RADS 2:  nodules that are non-concerning in appearance and behavior with a very low likelihood of becoming an active cancer. Recommendation is for a repeat annual scan in 12 months.  Lung RADS 3: nodules that are probably non-concerning , includes nodules with a low likelihood of becoming an active cancer.  Recommendation is for a 6-month repeat screening scan. Often noted after an upper respiratory illness. We will be in touch to make sure you have no questions, and to schedule your 6-month scan.  Lung RADS 4 A: nodules with concerning findings, recommendation is most often for a follow up scan in 3 months or additional testing based on our provider's assessment of the scan. We will be in touch to make sure you have no questions and to schedule the recommended 3 month follow up scan.  Lung RADS 4 B:  indicates findings that are concerning. We will be in touch with you to schedule additional diagnostic testing based on our provider's  assessment of the scan.   

## 2020-11-16 NOTE — Progress Notes (Signed)
Shared Decision Making Visit Lung Cancer Screening Program [phone])   Eligibility:  Age 70 y.o.  Pack Years Smoking History Calculation 98 pack year smoking history (# packs/per year x # years smoked)  Recent History of coughing up blood  no  Unexplained weight loss? no ( >Than 15 pounds within the last 6 months )  Prior History Lung / other cancer no (Diagnosis within the last 5 years already requiring surveillance chest CT Scans).  Smoking Status Current Smoker  Former Smokers: Years since quit: NA  Quit Date: NA  Visit Components:  Discussion included one or more decision making aids. yes  Discussion included risk/benefits of screening. yes  Discussion included potential follow up diagnostic testing for abnormal scans. yes  Discussion included meaning and risk of over diagnosis. yes  Discussion included meaning and risk of False Positives. yes  Discussion included meaning of total radiation exposure. yes  Counseling Included:  Importance of adherence to annual lung cancer LDCT screening. yes  Impact of comorbidities on ability to participate in the program. yes  Ability and willingness to under diagnostic treatment. yes  Smoking Cessation Counseling:  Current Smokers:   Discussed importance of smoking cessation. yes  Information about tobacco cessation classes and interventions provided to patient. yes  Patient provided with "ticket" for LDCT Scan. yes  Symptomatic Patient. no  Counseling NA  Diagnosis Code: Tobacco Use Z72.0  Asymptomatic Patient yes  Counseling (Intermediate counseling: > three minutes counseling) F8182  Former Smokers:   Discussed the importance of maintaining cigarette abstinence. yes  Diagnosis Code: Personal History of Nicotine Dependence. X93.716  Information about tobacco cessation classes and interventions provided to patient. Yes  Patient provided with "ticket" for LDCT Scan. yes  Written Order for Lung Cancer  Screening with LDCT placed in Epic. Yes (CT Chest Lung Cancer Screening Low Dose W/O CM) [id number] Z12.2-Screening of respiratory organs Z87.891-Personal history of nicotine dependence  BP 118/70 (BP Location: Left Arm, Cuff Size: Normal)   Pulse 67   Temp 98.8 F (37.1 C) (Oral)   Ht 5\' 10"  (1.778 m)   Wt 212 lb 6.4 oz (96.3 kg)   SpO2 96%   BMI 30.48 kg/m    I have spent 25 minutes of face to face time with Joe Greene discussing the risks and benefits of lung cancer screening. We viewed a power point together that explained in detail the above noted topics. We paused at intervals to allow for questions to be asked and answered to ensure understanding.We discussed that the single most powerful action that he can take to decrease his risk of developing lung cancer is to quit smoking. We discussed whether or not he is ready to commit to setting a quit date. We discussed options for tools to aid in quitting smoking including nicotine replacement therapy, non-nicotine medications, support groups, Quit Smart classes, and behavior modification. We discussed that often times setting smaller, more achievable goals, such as eliminating 1 cigarette a day for a week and then 2 cigarettes a day for a week can be helpful in slowly decreasing the number of cigarettes smoked. This allows for a sense of accomplishment as well as providing a clinical benefit. I gave him the " Be Stronger Than Your Excuses" card with contact information for community resources, classes, free nicotine replacement therapy, and access to mobile apps, text messaging, and on-line smoking cessation help. I have also given him my card and contact information in the event he needs to contact me. We discussed  the time and location of the scan, and that either Joe Miyamoto RN or I will call with the results within 24-48 hours of receiving them. I have offered him  a copy of the power point we viewed  as a resource in the event they need  reinforcement of the concepts we discussed today in the office. The patient verbalized understanding of all of  the above and had no further questions upon leaving the office. They have my contact information in the event they have any further questions.  I spent 4 minutes counseling on smoking cessation and the health risks of continued tobacco abuse.  I explained to the patient that there has been a high incidence of coronary artery disease noted on these exams. I explained that this is a non-gated exam therefore degree or severity cannot be determined. This patient is not on statin therapy. I have asked the patient to follow-up with their PCP regarding any incidental finding of coronary artery disease and management with diet or medication as their PCP  feels is clinically indicated. The patient verbalized understanding of the above and had no further questions upon completion of the visit.      Bevelyn Ngo, NP 11/16/2020

## 2020-11-17 ENCOUNTER — Other Ambulatory Visit: Payer: Self-pay | Admitting: *Deleted

## 2020-11-17 DIAGNOSIS — Z87891 Personal history of nicotine dependence: Secondary | ICD-10-CM

## 2020-11-17 DIAGNOSIS — F17210 Nicotine dependence, cigarettes, uncomplicated: Secondary | ICD-10-CM

## 2020-11-17 NOTE — Progress Notes (Signed)
Please call patient and let them  know their  low dose Ct was read as a Lung RADS 2: nodules that are benign in appearance and behavior with a very low likelihood of becoming a clinically active cancer due to size or lack of growth. Recommendation per radiology is for a repeat LDCT in 12 months. .Please let them  know we will order and schedule their  annual screening scan for 11/2021. Please let them  know there was notation of CAD on their  scan.  Please remind the patient  that this is a non-gated exam therefore degree or severity of disease  cannot be determined. Please have them  follow up with their PCP regarding potential risk factor modification, dietary therapy or pharmacologic therapy if clinically indicated. Pt.  is not  currently on statin therapy. Please place order for annual  screening scan for  11/2021 and fax results to PCP. Thanks so much.  Please let patient know there is CAD noted on scan, and to follow up with PCP regarding need for cards consult. Thanks so much

## 2020-11-24 ENCOUNTER — Other Ambulatory Visit: Payer: Self-pay

## 2020-11-24 ENCOUNTER — Ambulatory Visit (AMBULATORY_SURGERY_CENTER): Payer: Medicare PPO | Admitting: *Deleted

## 2020-11-24 VITALS — Ht 70.0 in | Wt 205.0 lb

## 2020-11-24 DIAGNOSIS — Z1211 Encounter for screening for malignant neoplasm of colon: Secondary | ICD-10-CM

## 2020-11-24 MED ORDER — SUPREP BOWEL PREP KIT 17.5-3.13-1.6 GM/177ML PO SOLN: 1.0000 | Freq: Once | ORAL | 0 refills | Status: AC

## 2020-11-24 NOTE — Progress Notes (Signed)
Pt verified name, DOB, address and insurance during PV today. Pt mailed instruction packet to included paper to complete and mail back to [facility] with addressed and stamped envelope, Emmi video, copy of consent form to read and not return, and instructions.  PV completed over the phone. Pt encouraged to call with questions or issues   No egg or soy allergy known to patient  No issues with past sedation with any surgeries or procedures No intubation problems in the past  No FH of Malignant Hyperthermia No diet pills per patient No home 02 use per patient  No blood thinners per patient  Pt denies issues with constipation  No A fib or A flutter  EMMI video to pt or via MyChart  COVID 19 guidelines implemented in PV today with Pt and RN  Pt is fully vaccinated  for Covid   Due to the COVID-19 pandemic we are asking patients to follow certain guidelines.  Pt aware of COVID protocols and [facility] guidelines   

## 2020-12-02 ENCOUNTER — Encounter: Payer: Self-pay | Admitting: Internal Medicine

## 2020-12-05 ENCOUNTER — Ambulatory Visit: Payer: Medicare PPO | Admitting: Family Medicine

## 2020-12-08 ENCOUNTER — Other Ambulatory Visit: Payer: Self-pay

## 2020-12-08 ENCOUNTER — Ambulatory Visit (AMBULATORY_SURGERY_CENTER): Payer: Medicare PPO | Admitting: Internal Medicine

## 2020-12-08 ENCOUNTER — Encounter: Payer: Self-pay | Admitting: Internal Medicine

## 2020-12-08 VITALS — BP 130/79 | HR 57 | Temp 98.2°F | Resp 21 | Ht 70.0 in | Wt 205.0 lb

## 2020-12-08 DIAGNOSIS — Z1211 Encounter for screening for malignant neoplasm of colon: Secondary | ICD-10-CM | POA: Diagnosis not present

## 2020-12-08 MED ORDER — SODIUM CHLORIDE 0.9 % IV SOLN: 500.0000 mL | INTRAVENOUS | Status: DC

## 2020-12-08 NOTE — Progress Notes (Signed)
Pt's states no medical or surgical changes since previsit or office visit. 

## 2020-12-08 NOTE — Progress Notes (Signed)
PT taken to PACU. Monitors in place. VSS. Report given to RN. 

## 2020-12-08 NOTE — Op Note (Signed)
[facility] Patient Name: Joe Greene Procedure Date: 12/08/2020 11:42 AM MRN: [id number] Endoscopist: Beverley Fiedler , MD Age: 70 Referring MD:  Date of Birth: 1951-05-22 Gender: Male Account #: [id number] Procedure:                Colonoscopy Indications:              Screening for colorectal malignant neoplasm, This                            is the patient's first colonoscopy Medicines:                Monitored Anesthesia Care Procedure:                Pre-Anesthesia Assessment:                           - Prior to the procedure, a History and Physical                            was performed, and patient medications and                            allergies were reviewed. The patient's tolerance of                            previous anesthesia was also reviewed. The risks                            and benefits of the procedure and the sedation                            options and risks were discussed with the patient.                            All questions were answered, and informed consent                            was obtained. Prior Anticoagulants: The patient has                            taken no previous anticoagulant or antiplatelet                            agents. ASA Grade Assessment: II - A patient with                            mild systemic disease. After reviewing the risks                            and benefits, the patient was deemed in                            satisfactory condition to undergo the procedure.  After obtaining informed consent, the colonoscope                            was passed under direct vision. Throughout the                            procedure, the patient's blood pressure, pulse, and                            oxygen saturations were monitored continuously. The                            Olympus CF-HQ190L [id number]) Colonoscope was                            introduced through the anus and  advanced to the                            cecum, identified by appendiceal orifice and                            ileocecal valve. The colonoscopy was performed                            without difficulty. The patient tolerated the                            procedure well. The quality of the bowel                            preparation was good. The ileocecal valve,                            appendiceal orifice, and rectum were photographed. Scope In: 11:54:46 AM Scope Out: 12:12:38 PM Scope Withdrawal Time: 0 hours 13 minutes 30 seconds  Total Procedure Duration: 0 hours 17 minutes 52 seconds  Findings:                 The digital rectal exam was normal.                           A few small and large-mouthed diverticula were                            found in the sigmoid colon and descending colon.                           Internal hemorrhoids were found during                            retroflexion. The hemorrhoids were small.                           The exam was otherwise without abnormality. Complications:  No immediate complications. Estimated Blood Loss:     Estimated blood loss: none. Impression:               - Diverticulosis in the sigmoid colon and in the                            descending colon.                           - Small internal hemorrhoids.                           - The examination was otherwise normal.                           - No specimens collected. Recommendation:           - Patient has a contact number available for                            emergencies. The signs and symptoms of potential                            delayed complications were discussed with the                            patient. Return to normal activities tomorrow.                            Written discharge instructions were provided to the                            patient.                           - Resume previous diet.                           -  Continue present medications.                           - No repeat colonoscopy due to age at next                            screening interval (80 years) and the absence of                            colonic polyps on today's exam. Beverley Fiedler, MD 12/08/2020 12:15:15 PM This report has been signed electronically.

## 2020-12-08 NOTE — Patient Instructions (Signed)
YOU HAD AN ENDOSCOPIC PROCEDURE TODAY AT THE [facility]:   Refer to the procedure report that was given to you for any specific questions about what was found during the examination.  If the procedure report does not answer your questions, please call your gastroenterologist to clarify.  If you requested that your care partner not be given the details of your procedure findings, then the procedure report has been included in a sealed envelope for you to review at your convenience later.  YOU SHOULD EXPECT: Some feelings of bloating in the abdomen. Passage of more gas than usual.  Walking can help get rid of the air that was put into your GI tract during the procedure and reduce the bloating. If you had a lower endoscopy (such as a colonoscopy or flexible sigmoidoscopy) you may notice spotting of blood in your stool or on the toilet paper. If you underwent a bowel prep for your procedure, you may not have a normal bowel movement for a few days.  Please Note:  You might notice some irritation and congestion in your nose or some drainage.  This is from the oxygen used during your procedure.  There is no need for concern and it should clear up in a day or so.  SYMPTOMS TO REPORT IMMEDIATELY:   Following lower endoscopy (colonoscopy or flexible sigmoidoscopy):  Excessive amounts of blood in the stool  Significant tenderness or worsening of abdominal pains  Swelling of the abdomen that is new, acute  Fever of 100F or higher  For urgent or emergent issues, a gastroenterologist can be reached at any hour by calling [phone]. Do not use MyChart messaging for urgent concerns.    DIET:  We do recommend a small meal at first, but then you may proceed to your regular diet.  Drink plenty of fluids but you should avoid alcoholic beverages for 24 hours.  ACTIVITY:  You should plan to take it easy for the rest of today and you should NOT DRIVE or use heavy machinery until tomorrow (because  of the sedation medicines used during the test).    FOLLOW UP: Our staff will call the number listed on your records 48-72 hours following your procedure to check on you and address any questions or concerns that you may have regarding the information given to you following your procedure. If we do not reach you, we will leave a message.  We will attempt to reach you two times.  During this call, we will ask if you have developed any symptoms of COVID 19. If you develop any symptoms (ie: fever, flu-like symptoms, shortness of breath, cough etc.) before then, please call [phone].  If you test positive for Covid 19 in the 2 weeks post procedure, please call and report this information to us.    If any biopsies were taken you will be contacted by phone or by letter within the next 1-3 weeks.  Please call us at [phone] if you have not heard about the biopsies in 3 weeks.    SIGNATURES/CONFIDENTIALITY: You and/or your care partner have signed paperwork which will be entered into your electronic medical record.  These signatures attest to the fact that that the information above on your After Visit Summary has been reviewed and is understood.  Full responsibility of the confidentiality of this discharge information lies with you and/or your care-partner. 

## 2020-12-12 ENCOUNTER — Telehealth: Payer: Self-pay

## 2020-12-12 NOTE — Telephone Encounter (Signed)
  Follow up Call-  Call back number 12/08/2020  Post procedure Call Back phone  # [phone]  Permission to leave phone message Yes     Patient questions:  Do you have a fever, pain , or abdominal swelling? No. Pain Score  0 *  Have you tolerated food without any problems? Yes.    Have you been able to return to your normal activities? Yes.    Do you have any questions about your discharge instructions: Diet   No. Medications  No. Follow up visit  No.  Do you have questions or concerns about your Care? No.  Actions: * If pain score is 4 or above: No action needed, pain <4.  1. Have you developed a fever since your procedure? no  2.   Have you had an respiratory symptoms (SOB or cough) since your procedure? no  3.   Have you tested positive for COVID 19 since your procedure no  4.   Have you had any family members/close contacts diagnosed with the COVID 19 since your procedure?  no   If yes to any of these questions please route to Laverna Peace, RN and Karlton Lemon, RN

## 2021-01-11 ENCOUNTER — Ambulatory Visit: Payer: Medicare PPO | Admitting: Family Medicine

## 2021-01-23 ENCOUNTER — Encounter: Payer: Self-pay | Admitting: Family Medicine

## 2021-01-23 ENCOUNTER — Ambulatory Visit (INDEPENDENT_AMBULATORY_CARE_PROVIDER_SITE_OTHER): Payer: Medicare PPO | Admitting: Family Medicine

## 2021-01-23 ENCOUNTER — Other Ambulatory Visit: Payer: Self-pay

## 2021-01-23 VITALS — BP 118/70 | HR 72 | Temp 98.1°F | Ht 70.0 in | Wt 210.8 lb

## 2021-01-23 DIAGNOSIS — K5730 Diverticulosis of large intestine without perforation or abscess without bleeding: Secondary | ICD-10-CM | POA: Insufficient documentation

## 2021-01-23 DIAGNOSIS — R030 Elevated blood-pressure reading, without diagnosis of hypertension: Secondary | ICD-10-CM

## 2021-01-23 DIAGNOSIS — L309 Dermatitis, unspecified: Secondary | ICD-10-CM | POA: Insufficient documentation

## 2021-01-23 DIAGNOSIS — K648 Other hemorrhoids: Secondary | ICD-10-CM | POA: Insufficient documentation

## 2021-01-23 DIAGNOSIS — L570 Actinic keratosis: Secondary | ICD-10-CM

## 2021-01-23 DIAGNOSIS — Z720 Tobacco use: Secondary | ICD-10-CM

## 2021-01-23 MED ORDER — FLUOCINONIDE 0.05 % EX CREA: 1.0000 "application " | TOPICAL_CREAM | Freq: Two times a day (BID) | CUTANEOUS | 0 refills | Status: DC

## 2021-01-23 NOTE — Progress Notes (Signed)
[facility]IMARY CARE [facility] [address] [place] [address] Dept: [phone] Dept Fax: [phone]  Chronic Care Office Visit  Subjective:    Patient ID: Joe Greene, male    DOB: 21-Jun-1951, 70 y.o..   MRN: [id number]  Chief Complaint  Patient presents with  . Follow-up    4-5 month f/u, c/o spot on the LT ear x months and cramps under rib cage off/on with no injury.     History of Present Illness:  Patient is in today for reassessment of chronic medical issues. He has a history of an increased blood pressure. Dr. Doreene Greene had recommended follow-up to assess this. [name] notes that his wife is no longer making him bacon, but otherwise he has not changed any thing.  Since his last visit, [name] had a colonoscopy. It showed some colonic diverticula and small internal hemorrhoids.  [name] continues to smoke cigarettes. He notes that this is mostly related to habitual use, rather than addiction. He often thinks about quitting, but he has not found a way to stop his habits.   [name] has a history of a rash on his left lower leg. He was previosuly seen by a dermatologist that prescribed Lidex cream. He notes when this flares, the Lidex quickly gets it back under control. He has some pruritis associated with the patch of rash.  [name] also notes a tender growth on the top of his left ear. This has been present for some months, but been more tender recently.  Past Medical History: Patient Active Problem List   Diagnosis Date Noted  . Internal hemorrhoids 01/23/2021  . Diverticula of colon 01/23/2021  . Eczema 01/23/2021  . Actinic keratoses 01/23/2021  . Elevated BP without diagnosis of hypertension 09/02/2020  . Tobacco use 09/02/2020   Past Surgical History:  Procedure Laterality Date  . FOOT SURGERY Right   . HERNIA REPAIR    . KNEE SURGERY    . WISDOM TOOTH EXTRACTION     Family History  Problem Relation Age of Onset  .  Colon cancer Neg Hx   . Colon polyps Neg Hx   . Esophageal cancer Neg Hx   . Rectal cancer Neg Hx   . Stomach cancer Neg Hx    Outpatient Medications Prior to Visit  Medication Sig Dispense Refill  . ibuprofen (ADVIL) 200 MG tablet Take 200 mg by mouth every 6 (six) hours as needed.    . fluocinonide cream (LIDEX) 0.05 % Apply 1 application topically 2 (two) times daily. PRN     No facility-administered medications prior to visit.   No Known Allergies    Objective:   Today's Vitals   01/23/21 1324  BP: 118/70  Pulse: 72  Temp: 98.1 F (36.7 C)  TempSrc: Temporal  SpO2: 95%  Weight: 210 lb 12.8 oz (95.6 kg)  Height: 5\' 10"  (1.778 m)   Body mass index is 30.25 kg/m.   General: Well developed, well nourished. No acute distress. Skin: There is a 2-3 mm papule on the apex of the left pinna. This has a slight scale with a central punctation. Both forearms have discoloration with small scaly and crusted lesions. There is an irregular patch of erythematous papules over the medial left shin area. These have a slight scale to them Psych: Alert and oriented. Normal mood and affect.  Health Maintenance Due  Topic Date Due  . Hepatitis C Screening  Never done     Assessment &  Plan:   1. Elevated BP without diagnosis of hypertension Blood pressure is normal today. We will continue to monitor this.  2. Tobacco use I recommend that [name] stop tobacco use. We did discuss him observing his habitual patterns and try and find ways ot alter his behavior so he can eventually quit.  3. Eczema, unspecified type Continue Lidex cream PRN  - fluocinonide cream (LIDEX) 0.05 %; Apply 1 application topically 2 (two) times daily. PRN  Dispense: 30 g; Refill: 0  4. Actinic keratoses The lesions on the forearms appear consistent with sun damage and actinic keratoses. The lesion on the ear may also be AK, but I am concerned for an early basal cell carcinoma. I recommend referral to dermatology  for assessment.  - Ambulatory referral to Dermatology    Joe Mast, MD

## 2021-02-13 DIAGNOSIS — D485 Neoplasm of uncertain behavior of skin: Secondary | ICD-10-CM | POA: Diagnosis not present

## 2021-02-13 DIAGNOSIS — L84 Corns and callosities: Secondary | ICD-10-CM | POA: Diagnosis not present

## 2021-02-13 DIAGNOSIS — H61012 Acute perichondritis of left external ear: Secondary | ICD-10-CM | POA: Diagnosis not present

## 2021-03-22 DIAGNOSIS — D225 Melanocytic nevi of trunk: Secondary | ICD-10-CM | POA: Diagnosis not present

## 2021-03-22 DIAGNOSIS — L814 Other melanin hyperpigmentation: Secondary | ICD-10-CM | POA: Diagnosis not present

## 2021-03-22 DIAGNOSIS — D485 Neoplasm of uncertain behavior of skin: Secondary | ICD-10-CM | POA: Diagnosis not present

## 2021-03-22 DIAGNOSIS — L918 Other hypertrophic disorders of the skin: Secondary | ICD-10-CM | POA: Diagnosis not present

## 2021-03-22 DIAGNOSIS — L821 Other seborrheic keratosis: Secondary | ICD-10-CM | POA: Diagnosis not present

## 2021-03-22 DIAGNOSIS — L905 Scar conditions and fibrosis of skin: Secondary | ICD-10-CM | POA: Diagnosis not present

## 2021-03-22 DIAGNOSIS — B353 Tinea pedis: Secondary | ICD-10-CM | POA: Diagnosis not present

## 2021-03-22 DIAGNOSIS — D1801 Hemangioma of skin and subcutaneous tissue: Secondary | ICD-10-CM | POA: Diagnosis not present

## 2021-07-18 DIAGNOSIS — Z8042 Family history of malignant neoplasm of prostate: Secondary | ICD-10-CM | POA: Diagnosis not present

## 2021-07-18 DIAGNOSIS — F17210 Nicotine dependence, cigarettes, uncomplicated: Secondary | ICD-10-CM | POA: Diagnosis not present

## 2021-07-18 DIAGNOSIS — R030 Elevated blood-pressure reading, without diagnosis of hypertension: Secondary | ICD-10-CM | POA: Diagnosis not present

## 2021-07-25 ENCOUNTER — Other Ambulatory Visit: Payer: Self-pay

## 2021-07-26 ENCOUNTER — Encounter: Payer: Self-pay | Admitting: Family Medicine

## 2021-07-26 ENCOUNTER — Ambulatory Visit: Payer: Medicare PPO | Admitting: Family Medicine

## 2021-07-26 VITALS — BP 124/70 | HR 66 | Temp 97.7°F | Ht 70.0 in | Wt 212.2 lb

## 2021-07-26 DIAGNOSIS — I700 Atherosclerosis of aorta: Secondary | ICD-10-CM | POA: Insufficient documentation

## 2021-07-26 DIAGNOSIS — R1010 Upper abdominal pain, unspecified: Secondary | ICD-10-CM

## 2021-07-26 DIAGNOSIS — R030 Elevated blood-pressure reading, without diagnosis of hypertension: Secondary | ICD-10-CM

## 2021-07-26 DIAGNOSIS — Z716 Tobacco abuse counseling: Secondary | ICD-10-CM | POA: Diagnosis not present

## 2021-07-26 DIAGNOSIS — J439 Emphysema, unspecified: Secondary | ICD-10-CM | POA: Insufficient documentation

## 2021-07-26 LAB — COMPREHENSIVE METABOLIC PANEL
ALT: 23 U/L (ref 0–53)
AST: 15 U/L (ref 0–37)
Albumin: 4.4 g/dL (ref 3.5–5.2)
Alkaline Phosphatase: 51 U/L (ref 39–117)
BUN: 21 mg/dL (ref 6–23)
CO2: 23 mEq/L (ref 19–32)
Calcium: 9.2 mg/dL (ref 8.4–10.5)
Chloride: 105 mEq/L (ref 96–112)
Creatinine, Ser: 0.94 mg/dL (ref 0.40–1.50)
GFR: 82.06 mL/min (ref >60.00)
Glucose, Bld: 105 mg/dL — ABNORMAL HIGH (ref 70–99)
Potassium: 4.5 mEq/L (ref 3.5–5.1)
Sodium: 138 mEq/L (ref 135–145)
Total Bilirubin: 0.5 mg/dL (ref 0.2–1.2)
Total Protein: 6.8 g/dL (ref 6.0–8.3)

## 2021-07-26 LAB — LIPASE: Lipase: 27 U/L (ref 11.0–59.0)

## 2021-07-26 LAB — CBC
HCT: 43.8 % (ref 39.0–52.0)
Hemoglobin: 14.6 g/dL (ref 13.0–17.0)
MCHC: 33.4 g/dL (ref 30.0–36.0)
MCV: 97.2 fl (ref 78.0–100.0)
Platelets: 227 10*3/uL (ref 150.0–400.0)
RBC: 4.51 Mil/uL (ref 4.22–5.81)
RDW: 14.5 % (ref 11.5–15.5)
WBC: 6.7 10*3/uL (ref 4.0–10.5)

## 2021-07-26 NOTE — Patient Instructions (Signed)
*  Need either the PCV-15 or PCV-20 (pneumococcal vaccine) after 09/02/2021.

## 2021-07-26 NOTE — Progress Notes (Signed)
[facility]ARE [facility] [address] [place] [address] Dept: [phone] Dept Fax: [phone]  Transfer of Care Office Visit  Subjective:    Patient ID: Joe Greene, male    DOB: [birth year], 70 y.o..   MRN: [id number]  Chief Complaint  Patient presents with   Transitions Of Care    6 mo f/u BP. Est care.    History of Present Illness:  Patient is in today to establish care. Mr. Bruins was born in [place]. He lived in [place] [facility] [place] for about 40 years, teaching weed science at [employer] He is married and has 2 children (43, 40), who live in [place] and [place]. 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 Mr. Villalon admits to smoking 2 ppd of cigarettes for many since his early 45s. He has not seriously attempted to quit smoking. He notes he enjoys using tobacco. He admits to some morning cough, but does not otherwise have any health effects he is aware of. He drinks 4-5 alcoholic drinks a week. He denies drug use.  Mr. Haynesworth has a history of some elevated blood pressures int he past, but has not been diagnosed or treated for hypertension.  Mr. Iannello has occasional headaches for which he takes ibuprofen.  Mr. Frith has occasional outbreaks of eczema on his leg, which is well controlled with Lidex crean.  Mr. Parcell notes that over the past few months he has been experiencing bilateral upper abdominal muscle cramps. He notes these are located at the bottom of the rib cage. Typically, these occur with movement and effect the right side more than the left. He denies any heartburn, N/V/D or constipation. He cannot relate the episodes to food intake. He notes that he can relive the pain with bending over and holding his breath for a short period of time. He does not note any increased belching or  flatulence.  Past Medical History: Patient Active Problem List   Diagnosis Date Noted   Internal hemorrhoids 01/23/2021   Diverticula of colon 01/23/2021   Eczema 01/23/2021   Actinic keratoses 01/23/2021   Elevated BP without diagnosis of hypertension 09/02/2020   Tobacco use 09/02/2020   Past Surgical History:  Procedure Laterality Date   FOOT SURGERY Right    HERNIA REPAIR Right    KNEE ARTHROSCOPY Left    WISDOM TOOTH EXTRACTION     Family History  Problem Relation Age of Onset   Stroke Mother    Cancer Father        Prostate   Colon cancer Neg Hx    Colon polyps Neg Hx    Esophageal cancer Neg Hx    Rectal cancer Neg Hx    Stomach cancer Neg Hx    Outpatient Medications Prior to Visit  Medication Sig Dispense Refill   fluocinonide cream (LIDEX) 0.05 % Apply 1 application topically 2 (two) times daily. PRN 30 g 0   ibuprofen (ADVIL) 200 MG tablet Take 200 mg by mouth every 6 (six) hours as needed.     No facility-administered medications prior to visit.   No Known Allergies    Objective:   Today's Vitals   07/26/21 0938  BP: 124/70  Pulse: 66  Temp: 97.7 F (36.5 C)  TempSrc: Temporal  SpO2: 95%  Weight: 212 lb 3.2 oz (96.3 kg)  Height:  5\' 10"  (1.778 m)   Body mass index is 30.45 kg/m.   General: Well developed, well nourished. No acute distress. Lungs: Clear to auscultation bilaterally. No wheezing, rales or rhonchi. CV: RRR without murmurs or rubs. Pulses 2+ bilaterally. Abdomen: Soft. Bowel sounds positive, normal pitch and frequency. No hepatosplenomegaly. Mildly tender in the   upper right abdomen more medially. No rebound or guarding. Psych: Alert and oriented. Normal mood and affect.  Health Maintenance Due  Topic Date Due   Hepatitis C Screening  Never done   COVID-19 Vaccine (4 - Booster for Pfizer series) 11/06/2020     Imaging LDCT (11/16/2020) IMPRESSION: 1. Lung-RADS 2, benign appearance or behavior. Continue annual screening with  low-dose chest CT without contrast in 12 months. 2. Aortic atherosclerosis (ICD10-I70.0). Coronary artery calcification. 3.  Emphysema (ICD10-J43.9).  Lab Results: Lab Results  Component Value Date   CHOL 185 09/02/2020   HDL 32.30 (L) 09/02/2020   LDLDIRECT 104.0 09/02/2020   TRIG 301.0 (H) 09/02/2020   CHOLHDL 6 09/02/2020    Assessment & Plan:   1. Elevated BP without diagnosis of hypertension Blood pressure is normal at present. We will monitor this over time.  2. Encounter for smoking cessation counseling Advised the patient of my recommendation that he stop smoking. Prior CT shows emphysema. Patient not ready to quit at this point. Patient will be due for repeat LDCT for lung cancer screening in January.  3. Upper abdominal pain The pain may be a muscle cramp, as Mr. Cobern believes. However, I would prefer to evaluate for any gallbladder or liver cause for this. I will request lab testing and an Ultrasound to assess. Will determien follow-up based on results of work-up.  - Comprehensive metabolic panel - Lipase - CBC - Elyn Peers Abdomen Limited RUQ (LIVER/GB); Future  [place], MD

## 2021-08-04 ENCOUNTER — Ambulatory Visit
Admission: RE | Admit: 2021-08-04 | Discharge: 2021-08-04 | Disposition: A | Discharge status: Home / Self Care | Payer: Medicare PPO | Source: Ambulatory Visit | Attending: Family Medicine | Admitting: Family Medicine

## 2021-08-04 DIAGNOSIS — R1010 Upper abdominal pain, unspecified: Secondary | ICD-10-CM

## 2021-08-04 DIAGNOSIS — K760 Fatty (change of) liver, not elsewhere classified: Secondary | ICD-10-CM | POA: Diagnosis not present

## 2021-09-11 DIAGNOSIS — B356 Tinea cruris: Secondary | ICD-10-CM | POA: Diagnosis not present

## 2021-09-11 DIAGNOSIS — R21 Rash and other nonspecific skin eruption: Secondary | ICD-10-CM | POA: Diagnosis not present

## 2021-09-17 ENCOUNTER — Inpatient Hospital Stay (HOSPITAL_COMMUNITY)
Admission: EM | Admit: 2021-09-17 | Discharge: 2021-09-20 | DRG: 603 | Disposition: A | Discharge status: Home / Self Care | Payer: Medicare PPO | Attending: Internal Medicine | Admitting: Internal Medicine

## 2021-09-17 ENCOUNTER — Encounter (HOSPITAL_COMMUNITY): Payer: Self-pay | Admitting: *Deleted

## 2021-09-17 ENCOUNTER — Other Ambulatory Visit: Payer: Self-pay

## 2021-09-17 DIAGNOSIS — L259 Unspecified contact dermatitis, unspecified cause: Secondary | ICD-10-CM | POA: Diagnosis present

## 2021-09-17 DIAGNOSIS — L0390 Cellulitis, unspecified: Secondary | ICD-10-CM | POA: Diagnosis present

## 2021-09-17 DIAGNOSIS — B372 Candidiasis of skin and nail: Secondary | ICD-10-CM | POA: Diagnosis present

## 2021-09-17 DIAGNOSIS — Z20822 Contact with and (suspected) exposure to covid-19: Secondary | ICD-10-CM | POA: Diagnosis present

## 2021-09-17 DIAGNOSIS — Z720 Tobacco use: Secondary | ICD-10-CM | POA: Diagnosis present

## 2021-09-17 DIAGNOSIS — R030 Elevated blood-pressure reading, without diagnosis of hypertension: Secondary | ICD-10-CM | POA: Diagnosis present

## 2021-09-17 DIAGNOSIS — Z823 Family history of stroke: Secondary | ICD-10-CM | POA: Diagnosis not present

## 2021-09-17 DIAGNOSIS — L03317 Cellulitis of buttock: Secondary | ICD-10-CM | POA: Diagnosis present

## 2021-09-17 DIAGNOSIS — Z8042 Family history of malignant neoplasm of prostate: Secondary | ICD-10-CM | POA: Diagnosis not present

## 2021-09-17 DIAGNOSIS — F17201 Nicotine dependence, unspecified, in remission: Secondary | ICD-10-CM | POA: Diagnosis present

## 2021-09-17 DIAGNOSIS — F17200 Nicotine dependence, unspecified, uncomplicated: Secondary | ICD-10-CM | POA: Diagnosis present

## 2021-09-17 DIAGNOSIS — F17210 Nicotine dependence, cigarettes, uncomplicated: Secondary | ICD-10-CM | POA: Diagnosis present

## 2021-09-17 LAB — COMPREHENSIVE METABOLIC PANEL
ALT: 20 U/L (ref 0–44)
AST: 17 U/L (ref 15–41)
Albumin: 3.4 g/dL — ABNORMAL LOW (ref 3.5–5.0)
Alkaline Phosphatase: 52 U/L (ref 38–126)
Anion gap: 8 (ref 5–15)
BUN: 13 mg/dL (ref 8–23)
CO2: 24 mmol/L (ref 22–32)
Calcium: 8.5 mg/dL — ABNORMAL LOW (ref 8.9–10.3)
Chloride: 105 mmol/L (ref 98–111)
Creatinine, Ser: 1 mg/dL (ref 0.61–1.24)
GFR, Estimated: >60 mL/min (ref >60)
Glucose, Bld: 98 mg/dL (ref 70–99)
Potassium: 4.3 mmol/L (ref 3.5–5.1)
Sodium: 137 mmol/L (ref 135–145)
Total Bilirubin: 0.7 mg/dL (ref 0.3–1.2)
Total Protein: 6.1 g/dL — ABNORMAL LOW (ref 6.5–8.1)

## 2021-09-17 LAB — CBC WITH DIFFERENTIAL/PLATELET
Abs Immature Granulocytes: 0.03 10*3/uL (ref 0.00–0.07)
Basophils Absolute: 0.1 10*3/uL (ref 0.0–0.1)
Basophils Relative: 1 %
Eosinophils Absolute: 1.7 10*3/uL — ABNORMAL HIGH (ref 0.0–0.5)
Eosinophils Relative: 16 %
HCT: 42.9 % (ref 39.0–52.0)
Hemoglobin: 14.6 g/dL (ref 13.0–17.0)
Immature Granulocytes: 0 %
Lymphocytes Relative: 24 %
Lymphs Abs: 2.7 10*3/uL (ref 0.7–4.0)
MCH: 32.8 pg (ref 26.0–34.0)
MCHC: 34 g/dL (ref 30.0–36.0)
MCV: 96.4 fL (ref 80.0–100.0)
Monocytes Absolute: 1.1 10*3/uL — ABNORMAL HIGH (ref 0.1–1.0)
Monocytes Relative: 10 %
Neutro Abs: 5.5 10*3/uL (ref 1.7–7.7)
Neutrophils Relative %: 49 %
Platelets: 245 10*3/uL (ref 150–400)
RBC: 4.45 MIL/uL (ref 4.22–5.81)
RDW: 13.2 % (ref 11.5–15.5)
WBC: 11.1 10*3/uL — ABNORMAL HIGH (ref 4.0–10.5)
nRBC: 0 % (ref 0.0–0.2)

## 2021-09-17 MED ORDER — SODIUM CHLORIDE 0.9 % IV BOLUS
1000.0000 mL | Freq: Once | INTRAVENOUS | Status: AC
  Administered 2021-09-17: 1000 mL via INTRAVENOUS

## 2021-09-17 MED ORDER — CEFEPIME HCL 2 G IJ SOLR
2.0000 g | Freq: Once | INTRAMUSCULAR | Status: AC
  Administered 2021-09-17: 2 g via INTRAVENOUS
  Filled 2021-09-17: qty 2

## 2021-09-17 MED ORDER — SODIUM CHLORIDE 0.9 % IV SOLN: INTRAVENOUS | Status: DC

## 2021-09-17 MED ORDER — VANCOMYCIN HCL 1500 MG/300ML IV SOLN
1500.0000 mg | Freq: Once | INTRAVENOUS | Status: AC
  Administered 2021-09-18: 1500 mg via INTRAVENOUS
  Filled 2021-09-17: qty 300

## 2021-09-17 NOTE — ED Triage Notes (Signed)
Rash on his buttocks for 2 weeks   has a hx of some type rash elsewhere not recent

## 2021-09-17 NOTE — ED Provider Notes (Signed)
[facility] EMERGENCY DEPARTMENT Provider Note   CSN: [id number] Arrival date & time: 09/17/21  1514     History Chief Complaint  Patient presents with   Rash    Joe Greene is a 70 y.o. male.  Patient reports no chronic medical conditions.  He is here with a 2-week history of rash and redness to his buttocks and gluteal cleft.  He has been seen in urgent care 2 times.  He was for started on clindamycin as well as Lotrimin cream.  He completed 5 days of clindamycin and a stop using Lotrimin because he thought it was making the rash worse.  He seen in urgent care again 2 days ago and was placed on Keflex as well as p.o. fluconazole.  Has had these for 2 days.  Reports intermittent fevers at home over the past several days but not feeling ill.  He has more itching than pain. He has noticed some itching and bleeding.  No pain with defecation. Wife reports spreading redness on bilateral gluteal clefts and butt cheeks since this morning.  He has had chills.  No pain with defecation no pain with having a bowel movement.  No chest pain or shortness of breath No history of diabetes.  He denies having any like this in the past.  Denies any new exposures to clothing, cleaning products or hygiene products. Wife feels redness is spreading towards his scrotum and perineum.  The history is provided by the patient and the spouse.  Rash Associated symptoms: no abdominal pain, no fever, no headaches, no joint pain, no myalgias, no nausea, no shortness of breath and not vomiting       Past Medical History:  Diagnosis Date   Cataract    forming     Patient Active Problem List   Diagnosis Date Noted   Emphysema of lung ([place]) 07/26/2021   Aortic atherosclerosis ([place]) 07/26/2021   Internal hemorrhoids 01/23/2021   Diverticula of colon 01/23/2021   Eczema 01/23/2021   Actinic keratoses 01/23/2021   Elevated BP without diagnosis of hypertension 09/02/2020   Tobacco use 09/02/2020     Past Surgical History:  Procedure Laterality Date   FOOT SURGERY Right    HERNIA REPAIR Right    KNEE ARTHROSCOPY Left    WISDOM TOOTH EXTRACTION         Family History  Problem Relation Age of Onset   Stroke Mother    Cancer Father        Prostate   Colon cancer Neg Hx    Colon polyps Neg Hx    Esophageal cancer Neg Hx    Rectal cancer Neg Hx    Stomach cancer Neg Hx     Social History   Tobacco Use   Smoking status: Every Day    Packs/day: 2.00    Types: Cigarettes   Smokeless tobacco: Never  Vaping Use   Vaping Use: Never used  Substance Use Topics   Alcohol use: Yes    Alcohol/week: 5.0 standard drinks    Types: 5 Standard drinks or equivalent per week    Comment: occ   Drug use: Never    Home Medications Prior to Admission medications   Medication Sig Start Date End Date Taking? Authorizing Provider  fluocinonide cream (LIDEX) [id number] % Apply 1 application topically 2 (two) times daily. PRN 01/23/21   Haydee Salter, MD  ibuprofen (ADVIL) 200 MG tablet Take 200 mg by mouth every 6 (six) hours as needed.  [provider]    Allergies    Patient has no known allergies.  Review of Systems   Review of Systems  Constitutional:  Negative for activity change, appetite change and fever.  HENT:  Negative for congestion and rhinorrhea.   Eyes:  Negative for photophobia.  Respiratory:  Negative for cough, chest tightness and shortness of breath.   Cardiovascular:  Negative for chest pain.  Gastrointestinal:  Negative for abdominal pain, nausea and vomiting.  Genitourinary:  Negative for dysuria and hematuria.  Musculoskeletal:  Negative for arthralgias, back pain and myalgias.  Skin:  Positive for rash and wound.  Neurological:  Negative for weakness, numbness and headaches.   all other systems are negative except as noted in the HPI and PMH.   Physical Exam Updated Vital Signs BP 140/82 (BP Location: Right Arm)   Pulse 71   Temp 97.9 F  (36.6 C)   Resp 17   Ht 5\' 10"  (1.778 m)   Wt 96.3 kg   SpO2 98%   BMI 30.46 kg/m   Physical Exam Vitals and nursing note reviewed.  Constitutional:      General: He is not in acute distress.    Appearance: He is well-developed.     Comments: Well-appearing, nontoxic  HENT:     Head: Normocephalic and atraumatic.     Mouth/Throat:     Pharynx: No oropharyngeal exudate.  Eyes:     Conjunctiva/sclera: Conjunctivae normal.     Pupils: Pupils are equal, round, and reactive to light.  Neck:     Comments: No meningismus. Cardiovascular:     Rate and Rhythm: Normal rate and regular rhythm.     Heart sounds: Normal heart sounds. No murmur heard. Pulmonary:     Effort: Pulmonary effort is normal. No respiratory distress.     Breath sounds: Normal breath sounds.  Abdominal:     Palpations: Abdomen is soft.     Tenderness: There is no abdominal tenderness. There is no guarding or rebound.  Genitourinary:    Comments: Diffuse redness and erythema involving gluteal cleft spreading to buttocks bilaterally extending to perineum.  There is mild surrounding peeling and erythema.  No induration or fluctuance.  No crepitus.  Scrotum nontender Musculoskeletal:        General: No tenderness. Normal range of motion.     Cervical back: Normal range of motion and neck supple.  Skin:    General: Skin is warm.  Neurological:     Mental Status: He is alert and oriented to person, place, and time.     Cranial Nerves: No cranial nerve deficit.     Motor: No abnormal muscle tone.     Coordination: Coordination normal.     Comments:  5/5 strength throughout. CN 2-12 intact.Equal grip strength.   Psychiatric:        Behavior: Behavior normal.    ED Results / Procedures / Treatments   Labs (all labs ordered are listed, but only abnormal results are displayed) Labs Reviewed  CBC WITH DIFFERENTIAL/PLATELET - Abnormal; Notable for the following components:      Result Value   WBC 11.1 (*)     Monocytes Absolute 1.1 (*)    Eosinophils Absolute 1.7 (*)    All other components within normal limits  COMPREHENSIVE METABOLIC PANEL - Abnormal; Notable for the following components:   Calcium 8.5 (*)    Total Protein 6.1 (*)    Albumin 3.4 (*)    All other components within normal  limits  C-REACTIVE PROTEIN - Abnormal; Notable for the following components:   CRP 2.7 (*)    All other components within normal limits  CULTURE, BLOOD (ROUTINE X 2)  CULTURE, BLOOD (ROUTINE X 2)  RESP PANEL BY RT-PCR (FLU A&B, COVID) ARPGX2  HIV ANTIBODY (ROUTINE TESTING W REFLEX)  BASIC METABOLIC PANEL  CBC    EKG None  Radiology No results found.  Procedures Procedures   Medications Ordered in ED Medications  sodium chloride 0.9 % bolus 1,000 mL (has no administration in time range)    And  0.9 %  sodium chloride infusion (has no administration in time range)  vancomycin (VANCOREADY) IVPB 1500 mg/300 mL (has no administration in time range)  ceFEPIme (MAXIPIME) 2 g in sodium chloride 0.9 % 100 mL IVPB (has no administration in time range)    ED Course  I have reviewed the triage vital signs and the nursing notes.  Pertinent labs & imaging results that were available during my care of the patient were reviewed by me and considered in my medical decision making (see chart for details).    MDM Rules/Calculators/A&P                          Redness and erythema involving gluteal cleft and buttocks. Vitals stable. No distress.  Appears consistent with cellulitis.  Has been on multiple rounds of outpatient antibiotics and not improving.  Also treated for fungal infection.  Patient appears well.  No crepitus.  Low suspicion for Fournier's gangrene at this time.  Patient appears well, nontoxic and nonseptic.  Low suspicion for abscess or necrotizing infection. Given failure of outpatient antibiotics will admit for IV antibiotics and continue oral fluconazole.  Discussed with Dr.  Antionette Char.   Final Clinical Impression(s) / ED Diagnoses Final diagnoses:  Cellulitis of buttock    Rx / DC Orders ED Discharge Orders     None        Grey Schlauch, Jeannett Senior, MD 09/18/21 [phone]

## 2021-09-17 NOTE — ED Provider Notes (Signed)
Emergency Medicine Provider Triage Evaluation Note  Mikaele Stecher , a 70 y.o. male  was evaluated in triage.  Pt complains of rash to his buttock area for the past 2 weeks.  He states that it is painful and itching.  He went to an urgent care and was provided prescription for clindamycin as well as fluconazole oral and Lotrimin cream.  He states he did not have any improvement in his symptoms and went to another urgent care yesterday.  He was switched from clindamycin to Keflex with additional prescription for fluconazole.  He states that he discontinued the Lotrimin cream as he felt like it was making his symptoms worse.  He states the redness has since spread.  He denies any fevers however states he has had some chills.  He denies any pain with defecation and is not having any issues with having a bowel movement.  No abdominal pain, nausea, vomiting.  He is not a diabetic.  Review of Systems  Positive: + rash, chills Negative: - abd pain, nausea, vomiting  Physical Exam  BP (!) 149/89 (BP Location: Right Arm)   Pulse 84   Temp 98.6 F (37 C) (Oral)   Resp 16   SpO2 97%  Gen:   Awake, no distress   Resp:  Normal effort  MSK:   Moves extremities without difficulty  Other:  Chaperone present for GU exam. Large area of erythema and scaling to buttocks bilaterally extending into the perineum  Medical Decision Making  Medically screening exam initiated at 3:37 PM.  Appropriate orders placed.  Barak Bialecki was informed that the remainder of the evaluation will be completed by another provider, this initial triage assessment does not replace that evaluation, and the importance of remaining in the ED until their evaluation is complete.     Tanda Rockers, PA-C 09/17/21 1539    Rolan Bucco, MD 09/17/21 [phone]

## 2021-09-18 ENCOUNTER — Encounter (HOSPITAL_COMMUNITY): Payer: Self-pay | Admitting: Family Medicine

## 2021-09-18 DIAGNOSIS — L03317 Cellulitis of buttock: Principal | ICD-10-CM

## 2021-09-18 DIAGNOSIS — L259 Unspecified contact dermatitis, unspecified cause: Secondary | ICD-10-CM | POA: Diagnosis present

## 2021-09-18 DIAGNOSIS — R030 Elevated blood-pressure reading, without diagnosis of hypertension: Secondary | ICD-10-CM

## 2021-09-18 DIAGNOSIS — Z720 Tobacco use: Secondary | ICD-10-CM

## 2021-09-18 DIAGNOSIS — B372 Candidiasis of skin and nail: Secondary | ICD-10-CM | POA: Insufficient documentation

## 2021-09-18 DIAGNOSIS — Z8042 Family history of malignant neoplasm of prostate: Secondary | ICD-10-CM | POA: Diagnosis not present

## 2021-09-18 DIAGNOSIS — L0390 Cellulitis, unspecified: Secondary | ICD-10-CM | POA: Diagnosis present

## 2021-09-18 DIAGNOSIS — Z20822 Contact with and (suspected) exposure to covid-19: Secondary | ICD-10-CM | POA: Diagnosis present

## 2021-09-18 DIAGNOSIS — Z823 Family history of stroke: Secondary | ICD-10-CM | POA: Diagnosis not present

## 2021-09-18 DIAGNOSIS — F17210 Nicotine dependence, cigarettes, uncomplicated: Secondary | ICD-10-CM | POA: Diagnosis present

## 2021-09-18 LAB — CBC
HCT: 37.8 % — ABNORMAL LOW (ref 39.0–52.0)
Hemoglobin: 13.1 g/dL (ref 13.0–17.0)
MCH: 33.2 pg (ref 26.0–34.0)
MCHC: 34.7 g/dL (ref 30.0–36.0)
MCV: 95.7 fL (ref 80.0–100.0)
Platelets: 213 10*3/uL (ref 150–400)
RBC: 3.95 MIL/uL — ABNORMAL LOW (ref 4.22–5.81)
RDW: 13.2 % (ref 11.5–15.5)
WBC: 10 10*3/uL (ref 4.0–10.5)
nRBC: 0 % (ref 0.0–0.2)

## 2021-09-18 LAB — BASIC METABOLIC PANEL
Anion gap: 8 (ref 5–15)
BUN: 12 mg/dL (ref 8–23)
CO2: 21 mmol/L — ABNORMAL LOW (ref 22–32)
Calcium: 7.9 mg/dL — ABNORMAL LOW (ref 8.9–10.3)
Chloride: 107 mmol/L (ref 98–111)
Creatinine, Ser: 0.86 mg/dL (ref 0.61–1.24)
GFR, Estimated: >60 mL/min (ref >60)
Glucose, Bld: 130 mg/dL — ABNORMAL HIGH (ref 70–99)
Potassium: 3.7 mmol/L (ref 3.5–5.1)
Sodium: 136 mmol/L (ref 135–145)

## 2021-09-18 LAB — RESP PANEL BY RT-PCR (FLU A&B, COVID) ARPGX2
Influenza A by PCR: NEGATIVE
Influenza B by PCR: NEGATIVE
SARS Coronavirus 2 by RT PCR: NEGATIVE

## 2021-09-18 LAB — C-REACTIVE PROTEIN: CRP: 2.7 mg/dL — ABNORMAL HIGH (ref <1.0)

## 2021-09-18 LAB — HIV ANTIBODY (ROUTINE TESTING W REFLEX): HIV Screen 4th Generation wRfx: NONREACTIVE

## 2021-09-18 MED ORDER — HYDROCODONE-ACETAMINOPHEN 5-325 MG PO TABS: 1.0000 | ORAL_TABLET | Freq: Four times a day (QID) | ORAL | Status: DC | PRN

## 2021-09-18 MED ORDER — ACETAMINOPHEN 650 MG RE SUPP: 650.0000 mg | Freq: Four times a day (QID) | RECTAL | Status: DC | PRN

## 2021-09-18 MED ORDER — ONDANSETRON HCL 4 MG/2ML IJ SOLN: 4.0000 mg | Freq: Four times a day (QID) | INTRAMUSCULAR | Status: DC | PRN

## 2021-09-18 MED ORDER — ENOXAPARIN SODIUM 40 MG/0.4ML IJ SOSY
40.0000 mg | PREFILLED_SYRINGE | INTRAMUSCULAR | Status: DC
  Administered 2021-09-18 – 2021-09-20 (×3): 40 mg via SUBCUTANEOUS
  Filled 2021-09-18 (×3): qty 0.4

## 2021-09-18 MED ORDER — PHENYLEPHRINE IN HARD FAT 0.25 % RE SUPP
1.0000 | Freq: Two times a day (BID) | RECTAL | Status: DC | PRN
  Administered 2021-09-18: 1 via RECTAL
  Filled 2021-09-18: qty 1

## 2021-09-18 MED ORDER — FLUCONAZOLE 150 MG PO TABS
150.0000 mg | ORAL_TABLET | ORAL | Status: DC
  Administered 2021-09-19: 150 mg via ORAL
  Filled 2021-09-18: qty 1

## 2021-09-18 MED ORDER — ZINC OXIDE 40 % EX OINT
TOPICAL_OINTMENT | Freq: Two times a day (BID) | CUTANEOUS | Status: DC
  Filled 2021-09-18: qty 57

## 2021-09-18 MED ORDER — ONDANSETRON HCL 4 MG PO TABS: 4.0000 mg | ORAL_TABLET | Freq: Four times a day (QID) | ORAL | Status: DC | PRN

## 2021-09-18 MED ORDER — ACETAMINOPHEN 325 MG PO TABS: 650.0000 mg | ORAL_TABLET | Freq: Four times a day (QID) | ORAL | Status: DC | PRN

## 2021-09-18 MED ORDER — CEFTRIAXONE SODIUM 1 G IJ SOLR
1.0000 g | INTRAMUSCULAR | Status: DC
  Administered 2021-09-18 – 2021-09-20 (×3): 1 g via INTRAVENOUS
  Filled 2021-09-18 (×3): qty 10

## 2021-09-18 MED ORDER — NICOTINE POLACRILEX 2 MG MT GUM
2.0000 mg | CHEWING_GUM | OROMUCOSAL | Status: DC | PRN
  Filled 2021-09-18: qty 1

## 2021-09-18 MED ORDER — HYDRALAZINE HCL 25 MG PO TABS: 25.0000 mg | ORAL_TABLET | Freq: Four times a day (QID) | ORAL | Status: DC | PRN

## 2021-09-18 MED ORDER — SENNOSIDES-DOCUSATE SODIUM 8.6-50 MG PO TABS: 1.0000 | ORAL_TABLET | Freq: Every evening | ORAL | Status: DC | PRN

## 2021-09-18 NOTE — Progress Notes (Signed)
PROGRESS NOTE    Joe Greene  FAO:[id number] DOB: 1950/11/23 DOA: 09/17/2021 PCP: Loyola Mast, MD    Brief Narrative:  Mr. Minus was admitted to the hospital with the working diagnosis of candida intertrigo with secondary bacterial cellulitis at the gluteal region.   70 year old male past medical history for tobacco abuse who presented to the hospital with redness and tenderness involving his buttocks.  Ongoing symptoms for 2 weeks, positive serous weeping, as an outpatient he was prescribed clindamycin and topical antifungal that he took for about a week.  Despite outpatient therapy his symptoms continue to worsen.  2 days before his hospitalization his antibiotics was changed to cephalexin.  On his initial physical examination his blood pressure was 144/80, heart rate 98, respiratory 17, temperature 97.9, oxygenation 98%, his lungs are clear to auscultation bilaterally, heart S1-S2, present, rhythmic, soft abdomen, no lower extremity edema. Positive erythema bilateral gluteal region at the medial aspect of each gluteus.   Sodium 137, potassium 4.3, chloride 105, bicarb 24, glucose 98, BUN 13, creatinine 1.0, white count 11.1, hemoglobin 14.6, hematocrit 42.9, platelets 245. SARS COVID-19 negative.  Assessment & Plan:   Principal Problem:   Cellulitis Active Problems:   Elevated BP without diagnosis of hypertension   Tobacco use  Gluteal cellulitis, fungal with bacterial over-infection. Wbc is down to 10 from 11, patient has been afebrile and cultures continue with no growth.  Clinically rash has improved but not completely resolved, compared to pictures taken at home.   Plan to continue antibiotic therapy with IV ceftriaxone and oral fluconazole. Add topical zinc oxide  Plan to follow up cell count, temperature curve and cultures.  Continue pain control with as needed hydrocodone Discontinue IV fluids.   2.  Tobacco abuse disorder.  Continue smoking cessation counseling Nicotine  patch.  Patient continue to be at high risk for worsening cellulitis.   Status is: Inpatient  Remains inpatient appropriate because: IV antibiotic therapy   DVT prophylaxis: Enoxaparin   Code Status:    full  Family Communication:   I spoke with patient's wife at the bedside, we talked in detail about patient's condition, plan of care and prognosis and all questions were addressed.     Antimicrobials:  Ceftriaxone and fluconazole     Subjective: Patient with no nausea or vomiting, no chest pain or dyspnea, rash has been improving but not yet back to baseline.   Objective: Vitals:   09/18/21 1200 09/18/21 1215 09/18/21 1230 09/18/21 1400  BP: 129/71   132/75  Pulse: 69 65 69 81  Resp: 18 (!) 22 (!) 22 (!) 24  Temp:      TempSrc:      SpO2: 97% 96% 97% 96%  Weight:      Height:        Intake/Output Summary (Last 24 hours) at 09/18/2021 1533 Last data filed at 09/18/2021 0240 Gross per 24 hour  Intake 1500 ml  Output --  Net 1500 ml   Filed Weights   09/17/21 1539  Weight: 96.3 kg    Examination:   General: Not in pain or dyspnea Neurology: Awake and alert, non focal  E ENT: no pallor, no icterus, oral mucosa moist Cardiovascular: No JVD. S1-S2 present, rhythmic, no gallops, rubs, or murmurs. No lower extremity edema. Pulmonary: vesicular breath sounds bilaterally, adequate air movement, no wheezing, rhonchi or rales. Gastrointestinal. Abdomen soft and non tender Skin. Gluteal rash inner aspect of each gluteus, ill defined margins, no purulence or open wound.  Musculoskeletal: no joint deformities     Data Reviewed: I have personally reviewed following labs and imaging studies  CBC: Recent Labs  Lab 09/17/21 1543 09/18/21 0221  WBC 11.1* 10.0  NEUTROABS 5.5  --   HGB 14.6 13.1  HCT 42.9 37.8*  MCV 96.4 95.7  PLT 245 [id number]   Basic Metabolic Panel: Recent Labs  Lab 09/17/21 1543 09/18/21 0221  NA 137 136  K 4.3 3.7  CL 105 107  CO2 24 21*   GLUCOSE 98 130*  BUN 13 12  CREATININE 1.00 0.86  CALCIUM 8.5* 7.9*   GFR: Estimated Creatinine Clearance: 93 mL/min (by C-G formula based on SCr of 0.86 mg/dL). Liver Function Tests: Recent Labs  Lab 09/17/21 1543  AST 17  ALT 20  ALKPHOS 52  BILITOT 0.7  PROT 6.1*  ALBUMIN 3.4*   No results for input(s): LIPASE, AMYLASE in the last 168 hours. No results for input(s): AMMONIA in the last 168 hours. Coagulation Profile: No results for input(s): INR, PROTIME in the last 168 hours. Cardiac Enzymes: No results for input(s): CKTOTAL, CKMB, CKMBINDEX, TROPONINI in the last 168 hours. BNP (last 3 results) No results for input(s): PROBNP in the last 8760 hours. HbA1C: No results for input(s): HGBA1C in the last 72 hours. CBG: No results for input(s): GLUCAP in the last 168 hours. Lipid Profile: No results for input(s): CHOL, HDL, LDLCALC, TRIG, CHOLHDL, LDLDIRECT in the last 72 hours. Thyroid Function Tests: No results for input(s): TSH, T4TOTAL, FREET4, T3FREE, THYROIDAB in the last 72 hours. Anemia Panel: No results for input(s): VITAMINB12, FOLATE, FERRITIN, TIBC, IRON, RETICCTPCT in the last 72 hours.    Radiology Studies: I have reviewed all of the imaging during this hospital visit personally     Scheduled Meds:  enoxaparin (LOVENOX) injection  40 mg Subcutaneous Q24H   [START ON 09/19/2021] fluconazole  150 mg Oral Weekly   Continuous Infusions:  sodium chloride 125 mL/hr at 09/18/21 1205   cefTRIAXone (ROCEPHIN)  IV Stopped (09/18/21 0218)     LOS: 0 days        Gabreil [place] Gerome Apley, MD

## 2021-09-18 NOTE — H&P (Signed)
History and Physical    Joe Greene NAT:[id number] DOB: 07-19-1951 DOA: 09/17/2021  PCP: Loyola Mast, MD   Patient coming from: Home   Chief Complaint: Redness and tenderness involving buttock despite outpatient treatment   HPI: Joe Greene is a pleasant 70 y.o. male with medical history significant for 2 pack/day smoking habit, now presenting to the emergency department for evaluation of redness involving his buttock despite outpatient treatments.  Patient reports developing some erythema in the gluteal cleft approximately 2 weeks ago, began to have some serous weeping from the site, was then evaluated at an urgent care 1 week ago where he was started on oral clindamycin and topical antifungal.  He continued to worsen, thought that it might be due to the topical agent which he stopped, but continued to have increasing erythema despite this which prompted him to seek evaluation at another urgent care on 09/16/2021.  He was started on Keflex at the second urgent care visit, but erythema has continued to spread along the buttock bilaterally as well as into the perineum and scrotum.  He denies any fevers or purulent drainage.  ED Course: Upon arrival to the ED, patient is found to be afebrile, saturating well on room air, and with blood pressure 146/107.  Chemistry panel is unremarkable and CBC features a mild leukocytosis.  Patient was given a liter of saline in the ED, blood cultures were ordered, and vancomycin and cefepime started.  Review of Systems:  All other systems reviewed and apart from HPI, are negative.  Past Medical History:  Diagnosis Date   Cataract    forming     Past Surgical History:  Procedure Laterality Date   FOOT SURGERY Right    HERNIA REPAIR Right    KNEE ARTHROSCOPY Left    WISDOM TOOTH EXTRACTION      Social History:   reports that he has been smoking cigarettes. He has been smoking an average of 2 packs per day. He has never used smokeless tobacco. He reports  current alcohol use of about 5.0 standard drinks per week. He reports that he does not use drugs.  No Known Allergies  Family History  Problem Relation Age of Onset   Stroke Mother    Cancer Father        Prostate   Colon cancer Neg Hx    Colon polyps Neg Hx    Esophageal cancer Neg Hx    Rectal cancer Neg Hx    Stomach cancer Neg Hx      Prior to Admission medications   Medication Sig Start Date End Date Taking? Authorizing Provider  fluocinonide cream (LIDEX) 0.05 % Apply 1 application topically 2 (two) times daily. PRN 01/23/21   Loyola Mast, MD  ibuprofen (ADVIL) 200 MG tablet Take 200 mg by mouth every 6 (six) hours as needed.    [provider]    Physical Exam: Vitals:   09/17/21 1539 09/17/21 1959 09/17/21 2153 09/17/21 2300  BP:  (!) 144/80 140/82 (!) 146/107  Pulse:  98 71 76  Resp:  17 17 18   Temp:  97.9 F (36.6 C)    TempSrc:      SpO2:  98% 98% 97%  Weight: 96.3 kg     Height: 5\' 10"  (1.778 m)       Constitutional: NAD, calm  Eyes: PERTLA, lids and conjunctivae normal ENMT: Mucous membranes are moist. Posterior pharynx clear of any exudate or lesions.   Neck: supple, no masses  Respiratory:  no wheezing, no crackles. No accessory muscle use.  Cardiovascular: S1 & S2 heard, regular rate and rhythm. No extremity edema.   Abdomen: No distension, no tenderness, soft. Bowel sounds active.  Musculoskeletal: no clubbing / cyanosis. No joint deformity upper and lower extremities.   Skin: Erythema and induration involving bilateral buttock symmetrically and extending to perineum and scrotum without fluctuance or crepitus. Skin otherwise, warm, dry, well-perfused. Neurologic: CN 2-12 grossly intact. Moving all extremities. Alert and oriented.  Psychiatric: Calm. Cooperative.    Labs and Imaging on Admission: I have personally reviewed following labs and imaging studies  CBC: Recent Labs  Lab 09/17/21 1543  WBC 11.1*  NEUTROABS 5.5  HGB 14.6   HCT 42.9  MCV 96.4  PLT 245   Basic Metabolic Panel: Recent Labs  Lab 09/17/21 1543  NA 137  K 4.3  CL 105  CO2 24  GLUCOSE 98  BUN 13  CREATININE 1.00  CALCIUM 8.5*   GFR: Estimated Creatinine Clearance: 80 mL/min (by C-G formula based on SCr of 1 mg/dL). Liver Function Tests: Recent Labs  Lab 09/17/21 1543  AST 17  ALT 20  ALKPHOS 52  BILITOT 0.7  PROT 6.1*  ALBUMIN 3.4*   No results for input(s): LIPASE, AMYLASE in the last 168 hours. No results for input(s): AMMONIA in the last 168 hours. Coagulation Profile: No results for input(s): INR, PROTIME in the last 168 hours. Cardiac Enzymes: No results for input(s): CKTOTAL, CKMB, CKMBINDEX, TROPONINI in the last 168 hours. BNP (last 3 results) No results for input(s): PROBNP in the last 8760 hours. HbA1C: No results for input(s): HGBA1C in the last 72 hours. CBG: No results for input(s): GLUCAP in the last 168 hours. Lipid Profile: No results for input(s): CHOL, HDL, LDLCALC, TRIG, CHOLHDL, LDLDIRECT in the last 72 hours. Thyroid Function Tests: No results for input(s): TSH, T4TOTAL, FREET4, T3FREE, THYROIDAB in the last 72 hours. Anemia Panel: No results for input(s): VITAMINB12, FOLATE, FERRITIN, TIBC, IRON, RETICCTPCT in the last 72 hours. Urine analysis:    Component Value Date/Time   COLORURINE YELLOW 09/02/2020 1112   APPEARANCEUR CLEAR 09/02/2020 1112   LABSPEC 1.025 09/02/2020 1112   PHURINE 6.0 09/02/2020 1112   GLUCOSEU NEGATIVE 09/02/2020 1112   HGBUR NEGATIVE 09/02/2020 1112   BILIRUBINUR NEGATIVE 09/02/2020 1112   KETONESUR NEGATIVE 09/02/2020 1112   UROBILINOGEN 0.2 09/02/2020 1112   NITRITE NEGATIVE 09/02/2020 1112   LEUKOCYTESUR NEGATIVE 09/02/2020 1112   Sepsis Labs: @LABRCNTIP (procalcitonin:4,lacticidven:4) )No results found for this or any previous visit (from the past 240 hour(s)).   Radiological Exams on Admission: No results found.  Assessment/Plan   1. Candidal intertrigo  with secondary bacterial cellulitis  - Presents with progressive erythema and induration of buttock and perineum despite oral antibiotics and antifungals  - Mild leukocytosis on presentation but not septic and no evidence for abscess or necrotizing infection  - Start IV Rocephin, continue oral fluconazole, consider imaging if continues to worsen or fails to improve in 24-48 hrs   2. Elevated BP  - Not on antihypertensives at home  - Treat as-needed for now    3. Smoker  - Not ready to quit, will use nicotine gum while in hospital     DVT prophylaxis: Lovenox  Code Status: Full  Level of Care: Level of care: Med-Surg Family Communication: Wife updated at bedside Disposition Plan:  Patient is from: home  Anticipated d/c is to: Home  Anticipated d/c date is: 11/8 or 09/20/21 Patient currently: Pending  improvement in cellulitis  Consults called: none  Admission status: Observation      Briscoe Deutscher, MD [facility]  09/18/2021, 12:37 AM

## 2021-09-18 NOTE — Plan of Care (Signed)

## 2021-09-19 ENCOUNTER — Ambulatory Visit: Payer: Medicare PPO

## 2021-09-19 DIAGNOSIS — R030 Elevated blood-pressure reading, without diagnosis of hypertension: Secondary | ICD-10-CM | POA: Diagnosis not present

## 2021-09-19 DIAGNOSIS — Z720 Tobacco use: Secondary | ICD-10-CM | POA: Diagnosis not present

## 2021-09-19 DIAGNOSIS — L03317 Cellulitis of buttock: Secondary | ICD-10-CM | POA: Diagnosis not present

## 2021-09-19 LAB — CBC
HCT: 38.9 % — ABNORMAL LOW (ref 39.0–52.0)
Hemoglobin: 12.8 g/dL — ABNORMAL LOW (ref 13.0–17.0)
MCH: 32.5 pg (ref 26.0–34.0)
MCHC: 32.9 g/dL (ref 30.0–36.0)
MCV: 98.7 fL (ref 80.0–100.0)
Platelets: 208 10*3/uL (ref 150–400)
RBC: 3.94 MIL/uL — ABNORMAL LOW (ref 4.22–5.81)
RDW: 13.2 % (ref 11.5–15.5)
WBC: 9.1 10*3/uL (ref 4.0–10.5)
nRBC: 0 % (ref 0.0–0.2)

## 2021-09-19 LAB — BASIC METABOLIC PANEL
Anion gap: 8 (ref 5–15)
BUN: 12 mg/dL (ref 8–23)
CO2: 22 mmol/L (ref 22–32)
Calcium: 8.4 mg/dL — ABNORMAL LOW (ref 8.9–10.3)
Chloride: 107 mmol/L (ref 98–111)
Creatinine, Ser: 0.94 mg/dL (ref 0.61–1.24)
GFR, Estimated: >60 mL/min (ref >60)
Glucose, Bld: 111 mg/dL — ABNORMAL HIGH (ref 70–99)
Potassium: 4.4 mmol/L (ref 3.5–5.1)
Sodium: 137 mmol/L (ref 135–145)

## 2021-09-19 MED ORDER — HYDROCORTISONE 0.5 % EX CREA
1.0000 "application " | TOPICAL_CREAM | Freq: Two times a day (BID) | CUTANEOUS | Status: AC
  Administered 2021-09-19 (×2): 1 via TOPICAL
  Filled 2021-09-19 (×2): qty 28.35

## 2021-09-19 MED ORDER — DIPHENHYDRAMINE HCL 25 MG PO CAPS: 25.0000 mg | ORAL_CAPSULE | Freq: Four times a day (QID) | ORAL | Status: DC | PRN

## 2021-09-19 NOTE — Plan of Care (Signed)

## 2021-09-19 NOTE — Progress Notes (Signed)
PROGRESS NOTE    Joe Greene  [id number] DOB: Dec 23, 1950 DOA: 09/17/2021 PCP: Haydee Salter, MD    Brief Narrative:  Joe Greene was admitted to the hospital with the working diagnosis of candida intertrigo with secondary bacterial cellulitis at the gluteal region.    70 year old male past medical history for tobacco abuse who presented to the hospital with redness and tenderness involving his buttocks.  Ongoing symptoms for 2 weeks, positive serous weeping, as an outpatient he was prescribed clindamycin and topical antifungal that he took for about a week.  Despite outpatient therapy his symptoms continue to worsen.  2 days before his hospitalization his antibiotics was changed to cephalexin.  On his initial physical examination his blood pressure was 144/80, heart rate 98, respiratory 17, temperature 97.9, oxygenation 98%, his lungs are clear to auscultation bilaterally, heart S1-S2, present, rhythmic, soft abdomen, no lower extremity edema. Positive erythema bilateral gluteal region at the medial aspect of each gluteus.    Sodium 137, potassium 4.3, chloride 105, bicarb 24, glucose 98, BUN 13, creatinine 1.0, white count 11.1, hemoglobin 14.6, hematocrit 42.9, platelets 245. SARS COVID-19 negative.  Patient has been placed on IV antibiotic therapy with ceftriaxone and oral antifungal with fluconazole.   Assessment & Plan:   Principal Problem:   Cellulitis Active Problems:   Elevated BP without diagnosis of hypertension   Tobacco use   Gluteal cellulitis, fungal with bacterial over-infection.  Compared to yesterday his rash continue to improve. Today with no leukocytosis and he has been afebrile. Positive pruritus. Rash not affecting perineum.    Antibiotic therapy with IV ceftriaxone and oral fluconazole. Continue with topical zinc oxide  Add benadryl as needed for itching.  Out of bed to chair tid with meals and ambulate in the hallway. Follow cell count in am, if clinically  continue to improve, plan for possible dc home tomorrow.    2.  Tobacco abuse disorder.  Smoking cessation counseling Nicotine patch.  3. Contact dermatitis. Posterior base of the neck with local irritation, small papules,positive local pruritus.  Add topical steroids bid.    Status is: Inpatient  Remains inpatient appropriate because: IV antibiotic therapy    DVT prophylaxis: Enoxaparin   Code Status:    full  Family Communication:   I spoke with patient's wife at the bedside, we talked in detail about patient's condition, plan of care and prognosis and all questions were addressed.    Antimicrobials:  Fluconazole Ceftriaxone     Subjective: Patient with improvement in his rash, no pain, no nausea or vomiting, continue to have pruritus,   Objective: Vitals:   09/18/21 1400 09/18/21 1643 09/18/21 2016 09/19/21 0859  BP: 132/75 132/73 111/90 123/68  Pulse: 81 83 78 65  Resp: (!) 24 17 16 20   Temp:  98.5 F (36.9 C) 98.2 F (36.8 C) 97.7 F (36.5 C)  TempSrc:  Oral Oral Oral  SpO2: 96% 97% 96% 95%  Weight:      Height:       No intake or output data in the 24 hours ending 09/19/21 1205 Filed Weights   09/17/21 1539  Weight: 96.3 kg    Examination:   General: Not in pain or dyspnea  Neurology: Awake and alert, non focal  E ENT: no pallor, no icterus, oral mucosa moist Cardiovascular: No JVD. S1-S2 present, rhythmic, no gallops, rubs, or murmurs. No lower extremity edema. Pulmonary: positive breath sounds bilaterally, adequate air movement, no wheezing, rhonchi or rales. Gastrointestinal. Abdomen soft and  non tender Skin. Gluteal rash continue to improve, decrease erythema intensity and extension.  Musculoskeletal: no joint deformities     Data Reviewed: I have personally reviewed following labs and imaging studies  CBC: Recent Labs  Lab 09/17/21 1543 09/18/21 0221 09/19/21 0500  WBC 11.1* 10.0 9.1  NEUTROABS 5.5  --   --   HGB 14.6 13.1 12.8*   HCT 42.9 37.8* 38.9*  MCV 96.4 95.7 98.7  PLT 245 213 208   Basic Metabolic Panel: Recent Labs  Lab 09/17/21 1543 09/18/21 0221 09/19/21 0500  NA 137 136 137  K 4.3 3.7 4.4  CL 105 107 107  CO2 24 21* 22  GLUCOSE 98 130* 111*  BUN 13 12 12   CREATININE 1.00 0.86 0.94  CALCIUM 8.5* 7.9* 8.4*   GFR: Estimated Creatinine Clearance: 85.1 mL/min (by C-G formula based on SCr of 0.94 mg/dL). Liver Function Tests: Recent Labs  Lab 09/17/21 1543  AST 17  ALT 20  ALKPHOS 52  BILITOT 0.7  PROT 6.1*  ALBUMIN 3.4*   No results for input(s): LIPASE, AMYLASE in the last 168 hours. No results for input(s): AMMONIA in the last 168 hours. Coagulation Profile: No results for input(s): INR, PROTIME in the last 168 hours. Cardiac Enzymes: No results for input(s): CKTOTAL, CKMB, CKMBINDEX, TROPONINI in the last 168 hours. BNP (last 3 results) No results for input(s): PROBNP in the last 8760 hours. HbA1C: No results for input(s): HGBA1C in the last 72 hours. CBG: No results for input(s): GLUCAP in the last 168 hours. Lipid Profile: No results for input(s): CHOL, HDL, LDLCALC, TRIG, CHOLHDL, LDLDIRECT in the last 72 hours. Thyroid Function Tests: No results for input(s): TSH, T4TOTAL, FREET4, T3FREE, THYROIDAB in the last 72 hours. Anemia Panel: No results for input(s): VITAMINB12, FOLATE, FERRITIN, TIBC, IRON, RETICCTPCT in the last 72 hours.    Radiology Studies: I have reviewed all of the imaging during this hospital visit personally     Scheduled Meds:  enoxaparin (LOVENOX) injection  40 mg Subcutaneous Q24H   fluconazole  150 mg Oral Weekly   liver oil-zinc oxide   Topical BID   Continuous Infusions:  cefTRIAXone (ROCEPHIN)  IV 1 g (09/19/21 0132)     LOS: 1 day        Joe Greene 13/08/22, MD

## 2021-09-20 ENCOUNTER — Telehealth: Payer: Self-pay

## 2021-09-20 DIAGNOSIS — R030 Elevated blood-pressure reading, without diagnosis of hypertension: Secondary | ICD-10-CM | POA: Diagnosis not present

## 2021-09-20 DIAGNOSIS — Z720 Tobacco use: Secondary | ICD-10-CM | POA: Diagnosis not present

## 2021-09-20 DIAGNOSIS — L03317 Cellulitis of buttock: Secondary | ICD-10-CM | POA: Diagnosis not present

## 2021-09-20 MED ORDER — AMOXICILLIN-POT CLAVULANATE 875-125 MG PO TABS
1.0000 | ORAL_TABLET | Freq: Two times a day (BID) | ORAL | Status: DC
Start: 2021-09-20 — End: 2021-09-20
  Administered 2021-09-20: 1 via ORAL
  Filled 2021-09-20: qty 1

## 2021-09-20 MED ORDER — ZINC OXIDE 40 % EX OINT: TOPICAL_OINTMENT | Freq: Two times a day (BID) | CUTANEOUS | 0 refills | Status: AC

## 2021-09-20 MED ORDER — AMOXICILLIN-POT CLAVULANATE 875-125 MG PO TABS: 1.0000 | ORAL_TABLET | Freq: Two times a day (BID) | ORAL | 0 refills | Status: AC

## 2021-09-20 MED ORDER — ACETAMINOPHEN 325 MG PO TABS: 650.0000 mg | ORAL_TABLET | Freq: Four times a day (QID) | ORAL | Status: DC | PRN

## 2021-09-20 NOTE — Progress Notes (Signed)
Patient and spouse provided with discharge education and materials. Verbalized understanding. IV access removed, no issue noted. Patient discharged from unit with all belongings.

## 2021-09-20 NOTE — Discharge Summary (Signed)
Physician Discharge Summary  Lucian Schuenke [id number] DOB: May 29, 1951 DOA: 09/17/2021  PCP: Haydee Salter, MD  Admit date: 09/17/2021 Discharge date: 09/20/2021  Admitted From: home  Disposition: home   Recommendations for Outpatient Follow-up and new medication changes:  Follow up with Dr. Gena Fray in 7 to 10 days.  Continue antibiotic therapy with Augmentin for 3 more days  Topical zinc oxide   Home Health: no   Equipment/Devices: no    Discharge Condition: stable  CODE STATUS: full  Diet recommendation:  heart healthy   Brief/Interim Summary: Mr. Walcott was admitted to the hospital with the working diagnosis of candida intertrigo with secondary bacterial cellulitis at the gluteal region.    70 year old male past medical history for tobacco abuse who presented to the hospital with redness and tenderness involving his buttocks.  Ongoing symptoms for 2 weeks, positive serous weeping, as an outpatient he was prescribed clindamycin and topical antifungal that he took for about a week.  Despite outpatient therapy his symptoms continue to worsen.  2 days before his hospitalization his antibiotics was changed to cephalexin.  On his initial physical examination his blood pressure was 144/80, heart rate 98, respiratory rate 17, temperature 97.9, oxygenation 98%, his lungs were clear to auscultation bilaterally, heart S1-S2, present, rhythmic, soft abdomen, no lower extremity edema. Positive erythema bilateral gluteal region at the medial aspect of each gluteus.    Sodium 137, potassium 4.3, chloride 105, bicarb 24, glucose 98, BUN 13, creatinine 1.0, white count 11.1, hemoglobin 14.6, hematocrit 42.9, platelets 245. SARS COVID-19 negative.   Patient has been placed on IV antibiotic therapy with ceftriaxone and oral antifungal with fluconazole.   Significant improvement in gluteal rash along with leukocytosis. Plan to continue oral antibiotic therapy for 5 more days.   Gluteal cellulitis, fungal  with bacterial over infection.  Patient was admitted to the medical ward, he did received antibiotic therapy with intravenous ceftriaxone and antifungal therapy with oral fluconazole. Topical zinc oxide  For pruritus he received diphenhydramine.  Patient's rash has significantly improved, his discharge white cell count is 9.1. The perineum area has no rash or signs of inflammation.  Patient will continue taking Augmentin for 3 more days, continue topical zinc oxide.  Follow-up with primary care within 7 days.  2.  Tobacco use disorder. Smoking cessation counseling, patient had nicotine patch during his hospitalization.  3.  Contact dermatitis.  Upper back rash consistent with contact dermatitis.  Patient received 2 doses a topical corticosteroid with significant improvement.   Discharge Diagnoses:  Principal Problem:   Cellulitis Active Problems:   Elevated BP without diagnosis of hypertension   Tobacco use    Discharge Instructions   Allergies as of 09/20/2021   No Known Allergies      Medication List     STOP taking these medications    cephALEXin 500 MG capsule Commonly known as: KEFLEX   fluconazole 100 MG tablet Commonly known as: DIFLUCAN   fluocinonide cream 0.05 % Commonly known as: LIDEX       TAKE these medications    acetaminophen 325 MG tablet Commonly known as: TYLENOL Take 2 tablets (650 mg total) by mouth every 6 (six) hours as needed for mild pain or moderate pain (or Fever >/= 101).   amoxicillin-clavulanate 875-125 MG tablet Commonly known as: AUGMENTIN Take 1 tablet by mouth every 12 (twelve) hours for 3 days.   ibuprofen 200 MG tablet Commonly known as: ADVIL Take 400 mg by mouth every 6 (six) hours  as needed for headache or mild pain.   liver oil-zinc oxide 40 % ointment Commonly known as: DESITIN Apply topically 2 (two) times daily for 5 days.        No Known Allergies     Procedures/Studies: No results  found.    Subjective: Patient is feeling better, continue to have pruritus but no significant pain, no nausea or vomiting.   Discharge Exam: Vitals:   09/20/21 0502 09/20/21 0757  BP: 131/89 130/66  Pulse: (!) 54 64  Resp: 17 17  Temp: 97.9 F (36.6 C) 98.8 F (37.1 C)  SpO2: 98% 93%   Vitals:   09/19/21 1240 09/19/21 2041 09/20/21 0502 09/20/21 0757  BP: 134/83 131/73 131/89 130/66  Pulse: 63 68 (!) 54 64  Resp:  18 17 17   Temp: 97.7 F (36.5 C) 98.1 F (36.7 C) 97.9 F (36.6 C) 98.8 F (37.1 C)  TempSrc:   Oral Oral  SpO2: 95% 92% 98% 93%  Weight:      Height:        General: Not in pain or dyspnea  Neurology: Awake and alert, non focal  E ENT: no pallor, no icterus, oral mucosa moist Cardiovascular: No JVD. S1-S2 present, rhythmic, no gallops, rubs, or murmurs. No lower extremity edema. Pulmonary: positive breath sounds bilaterally, adequate air movement, no wheezing, rhonchi or rales. Gastrointestinal. Abdomen soft and non tender Skin. Gluteal region, medical aspect bilaterally with fading rash, no open wound or drainage. Perineum with no erythema or edema.  Musculoskeletal: no joint deformities   The results of significant diagnostics from this hospitalization (including imaging, microbiology, ancillary and laboratory) are listed below for reference.     Microbiology: Recent Results (from the past 240 hour(s))  Blood culture (routine x 2)     Status: None (Preliminary result)   Collection Time: 09/17/21 11:08 PM   Specimen: BLOOD  Result Value Ref Range Status   Specimen Description BLOOD LEFT ANTECUBITAL  Final   Special Requests   Final    BOTTLES DRAWN AEROBIC AND ANAEROBIC Blood Culture results may not be optimal due to an excessive volume of blood received in culture bottles   Culture   Final    NO GROWTH 2 DAYS Performed at [facility], [place] [address], [place], [place] [address]    Report Status PENDING  Incomplete  Blood culture (routine  x 2)     Status: None (Preliminary result)   Collection Time: 09/17/21 11:13 PM   Specimen: BLOOD LEFT FOREARM  Result Value Ref Range Status   Specimen Description BLOOD LEFT FOREARM  Final   Special Requests   Final    BOTTLES DRAWN AEROBIC AND ANAEROBIC Blood Culture results may not be optimal due to an excessive volume of blood received in culture bottles   Culture   Final    NO GROWTH 2 DAYS Performed at [facility], [address] [place], [place] [address]    Report Status PENDING  Incomplete  Resp Panel by RT-PCR (Flu A&B, Covid) Nasopharyngeal Swab     Status: None   Collection Time: 09/17/21 11:53 PM   Specimen: Nasopharyngeal Swab; Nasopharyngeal(NP) swabs in vial transport medium  Result Value Ref Range Status   SARS Coronavirus 2 by RT PCR NEGATIVE NEGATIVE Final    Comment: (NOTE) SARS-CoV-2 target nucleic acids are NOT DETECTED.  The SARS-CoV-2 RNA is generally detectable in upper respiratory specimens during the acute phase of infection. The lowest concentration of SARS-CoV-2 viral  copies this assay can detect is 138 copies/mL. A negative result does not preclude SARS-Cov-2 infection and should not be used as the sole basis for treatment or other patient management decisions. A negative result may occur with  improper specimen collection/handling, submission of specimen other than nasopharyngeal swab, presence of viral mutation(s) within the areas targeted by this assay, and inadequate number of viral copies(<138 copies/mL). A negative result must be combined with clinical observations, patient history, and epidemiological information. The expected result is Negative.  Fact Sheet for Patients:  EntrepreneurPulse.com.au  Fact Sheet for Healthcare Providers:  IncredibleEmployment.be  This test is no t yet approved or cleared by the [place] FDA and  has been authorized for detection and/or diagnosis of SARS-CoV-2  by FDA under an Emergency Use Authorization (EUA). This EUA will remain  in effect (meaning this test can be used) for the duration of the COVID-19 declaration under Section 564(b)(1) of the Act, 21 U.S.C.section 360bbb-3(b)(1), unless the authorization is terminated  or revoked sooner.       Influenza A by PCR NEGATIVE NEGATIVE Final   Influenza B by PCR NEGATIVE NEGATIVE Final    Comment: (NOTE) The Xpert Xpress SARS-CoV-2/FLU/RSV plus assay is intended as an aid in the diagnosis of influenza from Nasopharyngeal swab specimens and should not be used as a sole basis for treatment. Nasal washings and aspirates are unacceptable for Xpert Xpress SARS-CoV-2/FLU/RSV testing.  Fact Sheet for Patients: EntrepreneurPulse.com.au  Fact Sheet for Healthcare Providers: IncredibleEmployment.be  This test is not yet approved or cleared by the [place] FDA and has been authorized for detection and/or diagnosis of SARS-CoV-2 by FDA under an Emergency Use Authorization (EUA). This EUA will remain in effect (meaning this test can be used) for the duration of the COVID-19 declaration under Section 564(b)(1) of the Act, 21 U.S.C. section 360bbb-3(b)(1), unless the authorization is terminated or revoked.  Performed at [facility], [place] [address], [place], [place] [address]      Labs: BNP (last 3 results) No results for input(s): BNP in the last 8760 hours. Basic Metabolic Panel: Recent Labs  Lab 09/17/21 1543 09/18/21 0221 09/19/21 0500  NA 137 136 137  K 4.3 3.7 4.4  CL 105 107 107  CO2 24 21* 22  GLUCOSE 98 130* 111*  BUN 13 12 12   CREATININE 1.00 0.86 0.94  CALCIUM 8.5* 7.9* 8.4*   Liver Function Tests: Recent Labs  Lab 09/17/21 1543  AST 17  ALT 20  ALKPHOS 52  BILITOT 0.7  PROT 6.1*  ALBUMIN 3.4*   No results for input(s): LIPASE, AMYLASE in the last 168 hours. No results for input(s): AMMONIA in the last 168  hours. CBC: Recent Labs  Lab 09/17/21 1543 09/18/21 0221 09/19/21 0500  WBC 11.1* 10.0 9.1  NEUTROABS 5.5  --   --   HGB 14.6 13.1 12.8*  HCT 42.9 37.8* 38.9*  MCV 96.4 95.7 98.7  PLT 245 213 208   Cardiac Enzymes: No results for input(s): CKTOTAL, CKMB, CKMBINDEX, TROPONINI in the last 168 hours. BNP: Invalid input(s): POCBNP CBG: No results for input(s): GLUCAP in the last 168 hours. D-Dimer No results for input(s): DDIMER in the last 72 hours. Hgb A1c No results for input(s): HGBA1C in the last 72 hours. Lipid Profile No results for input(s): CHOL, HDL, LDLCALC, TRIG, CHOLHDL, LDLDIRECT in the last 72 hours. Thyroid function studies No results for input(s): TSH, T4TOTAL, T3FREE, THYROIDAB in the last 72 hours.  Invalid input(s):  FREET3 Anemia work up No results for input(s): VITAMINB12, FOLATE, FERRITIN, TIBC, IRON, RETICCTPCT in the last 72 hours. Urinalysis    Component Value Date/Time   COLORURINE YELLOW 09/02/2020 1112   APPEARANCEUR CLEAR 09/02/2020 1112   LABSPEC 1.025 09/02/2020 1112   PHURINE 6.0 09/02/2020 1112   GLUCOSEU NEGATIVE 09/02/2020 1112   HGBUR NEGATIVE 09/02/2020 1112   BILIRUBINUR NEGATIVE 09/02/2020 1112   KETONESUR NEGATIVE 09/02/2020 1112   UROBILINOGEN 0.2 09/02/2020 1112   NITRITE NEGATIVE 09/02/2020 1112   LEUKOCYTESUR NEGATIVE 09/02/2020 1112   Sepsis Labs Invalid input(s): PROCALCITONIN,  WBC,  LACTICIDVEN Microbiology Recent Results (from the past 240 hour(s))  Blood culture (routine x 2)     Status: None (Preliminary result)   Collection Time: 09/17/21 11:08 PM   Specimen: BLOOD  Result Value Ref Range Status   Specimen Description BLOOD LEFT ANTECUBITAL  Final   Special Requests   Final    BOTTLES DRAWN AEROBIC AND ANAEROBIC Blood Culture results may not be optimal due to an excessive volume of blood received in culture bottles   Culture   Final    NO GROWTH 2 DAYS Performed at [facility], [address],  [place], [place] [address]    Report Status PENDING  Incomplete  Blood culture (routine x 2)     Status: None (Preliminary result)   Collection Time: 09/17/21 11:13 PM   Specimen: BLOOD LEFT FOREARM  Result Value Ref Range Status   Specimen Description BLOOD LEFT FOREARM  Final   Special Requests   Final    BOTTLES DRAWN AEROBIC AND ANAEROBIC Blood Culture results may not be optimal due to an excessive volume of blood received in culture bottles   Culture   Final    NO GROWTH 2 DAYS Performed at [facility], [address] [place], [place] [address]    Report Status PENDING  Incomplete  Resp Panel by RT-PCR (Flu A&B, Covid) Nasopharyngeal Swab     Status: None   Collection Time: 09/17/21 11:53 PM   Specimen: Nasopharyngeal Swab; Nasopharyngeal(NP) swabs in vial transport medium  Result Value Ref Range Status   SARS Coronavirus 2 by RT PCR NEGATIVE NEGATIVE Final    Comment: (NOTE) SARS-CoV-2 target nucleic acids are NOT DETECTED.  The SARS-CoV-2 RNA is generally detectable in upper respiratory specimens during the acute phase of infection. The lowest concentration of SARS-CoV-2 viral copies this assay can detect is 138 copies/mL. A negative result does not preclude SARS-Cov-2 infection and should not be used as the sole basis for treatment or other patient management decisions. A negative result may occur with  improper specimen collection/handling, submission of specimen other than nasopharyngeal swab, presence of viral mutation(s) within the areas targeted by this assay, and inadequate number of viral copies(<138 copies/mL). A negative result must be combined with clinical observations, patient history, and epidemiological information. The expected result is Negative.  Fact Sheet for Patients:  BloggerCourse.com  Fact Sheet for Healthcare Providers:  SeriousBroker.it  This test is no t yet approved or cleared by the [place] FDA and  has been authorized for detection and/or diagnosis of SARS-CoV-2 by FDA under an Emergency Use Authorization (EUA). This EUA will remain  in effect (meaning this test can be used) for the duration of the COVID-19 declaration under Section 564(b)(1) of the Act, 21 U.S.C.section 360bbb-3(b)(1), unless the authorization is terminated  or revoked sooner.       Influenza A by PCR NEGATIVE NEGATIVE  Final   Influenza B by PCR NEGATIVE NEGATIVE Final    Comment: (NOTE) The Xpert Xpress SARS-CoV-2/FLU/RSV plus assay is intended as an aid in the diagnosis of influenza from Nasopharyngeal swab specimens and should not be used as a sole basis for treatment. Nasal washings and aspirates are unacceptable for Xpert Xpress SARS-CoV-2/FLU/RSV testing.  Fact Sheet for Patients: EntrepreneurPulse.com.au  Fact Sheet for Healthcare Providers: IncredibleEmployment.be  This test is not yet approved or cleared by the [place] FDA and has been authorized for detection and/or diagnosis of SARS-CoV-2 by FDA under an Emergency Use Authorization (EUA). This EUA will remain in effect (meaning this test can be used) for the duration of the COVID-19 declaration under Section 564(b)(1) of the Act, 21 U.S.C. section 360bbb-3(b)(1), unless the authorization is terminated or revoked.  Performed at [facility], [place] [address] [place], [place] [address]      Time coordinating discharge: 45 minutes  SIGNED:   Tawni Millers, MD  [facility] 09/20/2021, 10:31 AM

## 2021-09-20 NOTE — Telephone Encounter (Signed)
Hospital follow up appointment scheduled 10/04/2021  11230 am with Dr. Veto Kemps    L.Tatem Fesler,LPN

## 2021-09-20 NOTE — Telephone Encounter (Signed)
Transition Care Management Unsuccessful Follow-up Telephone Call  Date of discharge and from where:  [facility] 09/20/2021  Attempts:  1st Attempt  Reason for unsuccessful TCM follow-up call:  Left voice message   Patient needs 1 week follow up appointment for Dr. Veto Kemps

## 2021-09-20 NOTE — Telephone Encounter (Signed)
Hospital follow up appointment with Dr. Veto Kemps  10/04/2021  1130 am confirmed with patient   L.Rhia Blatchford,LPN

## 2021-09-20 NOTE — Plan of Care (Signed)

## 2021-09-20 NOTE — Telephone Encounter (Signed)
Transition Care Management Follow-up Telephone Call Date of discharge and from where: [facility] 09/20/2021 How have you been since you were released from the hospital? [facility]  Any questions or concerns? No  Items Reviewed: Did the pt receive and understand the discharge instructions provided? Yes  Medications obtained and verified? Yes  Other?  N/a Any new allergies since your discharge? No  Dietary orders reviewed? Yes Do you have support at home? Yes   Home Care and Equipment/Supplies: Were home health services ordered? not applicable If so, what is the name of the agency? N/a  Has the agency set up a time to come to the patient's home? not applicable Were any new equipment or medical supplies ordered?  No What is the name of the medical supply agency? N/a Were you able to get the supplies/equipment? not applicable Do you have any questions related to the use of the equipment or supplies? No  Functional Questionnaire: (I = Independent and D = Dependent) ADLs: I  Bathing/Dressing- i  Meal Prep- i  Eating- i  Maintaining continence- i  Transferring/Ambulation- i  Managing Meds- i  Follow up appointments reviewed:  PCP Hospital f/u appt confirmed? Yes  Scheduled to see Dr.Rudd   Specialist Hospital f/u appt confirmed? No   Are transportation arrangements needed? No  If their condition worsens, is the pt aware to call PCP or go to the Emergency Dept.? Yes Was the patient provided with contact information for the PCP's office or ED? Yes Was to pt encouraged to call back with questions or concerns? Yes

## 2021-09-23 LAB — CULTURE, BLOOD (ROUTINE X 2)
Culture: NO GROWTH
Culture: NO GROWTH

## 2021-10-03 ENCOUNTER — Other Ambulatory Visit: Payer: Self-pay

## 2021-10-04 ENCOUNTER — Encounter: Payer: Self-pay | Admitting: Family Medicine

## 2021-10-04 ENCOUNTER — Ambulatory Visit: Payer: Medicare PPO | Admitting: Family Medicine

## 2021-10-04 VITALS — BP 122/74 | HR 78 | Temp 97.9°F | Ht 70.0 in | Wt 211.6 lb

## 2021-10-04 DIAGNOSIS — L03317 Cellulitis of buttock: Secondary | ICD-10-CM | POA: Diagnosis not present

## 2021-10-04 DIAGNOSIS — L309 Dermatitis, unspecified: Secondary | ICD-10-CM | POA: Diagnosis not present

## 2021-10-04 DIAGNOSIS — Z720 Tobacco use: Secondary | ICD-10-CM

## 2021-10-04 DIAGNOSIS — L0231 Cutaneous abscess of buttock: Secondary | ICD-10-CM | POA: Diagnosis not present

## 2021-10-04 MED ORDER — TRIAMCINOLONE ACETONIDE 0.1 % EX CREA: 1.0000 "application " | TOPICAL_CREAM | Freq: Two times a day (BID) | CUTANEOUS | 2 refills | Status: DC

## 2021-10-04 MED ORDER — HYDROXYZINE PAMOATE 25 MG PO CAPS: 25.0000 mg | ORAL_CAPSULE | Freq: Three times a day (TID) | ORAL | 0 refills | Status: DC | PRN

## 2021-10-04 NOTE — Progress Notes (Signed)
[facility] [address] [place] [address] Dept: [phone] Dept Fax: [phone]  Office Visit  Subjective:    Patient ID: [facility], male    DOB: [birth year], 70 y.o..   MRN: [id number]  Chief Complaint  Patient presents with   Hospitalization Follow-up    Hospital f/u for Cellulitis on Buttocks on 09/17/21.  He reports feeling better.     History of Present Illness:  Patient is in today for follow-up from his recent hospitalization for a cellulitis of the buttocks. He was admitted at [facility] 11/6-11/9 due to progressive cellulitis around the upper gluteal cleft. He has completed his antibiotics and feels the infection has resolved. He still has mild itching in the gluteal area, but also some mild generalized itching. He notes that he devloped a generalized rash while int he hospital that he relates to lying on a plastic pillow while in the ER for many hours.  Joe Greene continues to smoke. He states that he is really not interested in quitting. He denies any significant respiratory issues.  Past Medical History: Patient Active Problem List   Diagnosis Date Noted   Candidal intertrigo    Emphysema of lung (HCC) 07/26/2021   Aortic atherosclerosis (HCC) 07/26/2021   Internal hemorrhoids 01/23/2021   Diverticula of colon 01/23/2021   Eczema 01/23/2021   Actinic keratoses 01/23/2021   Tobacco use 09/02/2020   Past Surgical History:  Procedure Laterality Date   FOOT SURGERY Right    HERNIA REPAIR Right    KNEE ARTHROSCOPY Left    WISDOM TOOTH EXTRACTION     Family History  Problem Relation Age of Onset   Stroke Mother    Cancer Father        Prostate   Colon cancer Neg Hx    Colon polyps Neg Hx    Esophageal cancer Neg Hx    Rectal cancer Neg Hx    Stomach cancer Neg Hx    Outpatient Medications Prior to Visit  Medication Sig Dispense Refill   acetaminophen (TYLENOL) 325 MG tablet Take 2 tablets (650 mg total)  by mouth every 6 (six) hours as needed for mild pain or moderate pain (or Fever >/= 101).     ibuprofen (ADVIL) 200 MG tablet Take 400 mg by mouth every 6 (six) hours as needed for headache or mild pain.     No facility-administered medications prior to visit.   No Known Allergies    Objective:   Today's Vitals   10/04/21 1129  BP: 122/74  Pulse: 78  Temp: 97.9 F (36.6 C)  TempSrc: Temporal  SpO2: 96%  Weight: 211 lb 9.6 oz (96 kg)  Height: 5\' 10"  (1.778 m)   Body mass index is 30.36 kg/m.   General: Well developed, well nourished. No acute distress. Buttocks: Skin is intact and shows no sign of redness. There are small flat papules down the   upper gluteal cleft. These are skin-colored and show no redness. Psych: Alert and oriented. Normal mood and affect.  Health Maintenance Due  Topic Date Due   Hepatitis C Screening  Never done   Pneumonia Vaccine 28+ Years old (2 - PCV) 09/02/2021     Assessment & Plan:   1. Cellulitis of buttock Reviewed hospital discharge summary. Resolved.  2. Eczema, unspecified type Joe Greene has a history of eczema. This may be what is causing a generalized itchiness. I will provide some hydroxyzine for itching. I will have him use TAC  cream in the gluteal cleft to help with the localized issue.  - hydrOXYzine (VISTARIL) 25 MG capsule; Take 1 capsule (25 mg total) by mouth every 8 (eight) hours as needed.  Dispense: 30 capsule; Refill: 0 - triamcinolone cream (KENALOG) 0.1 %; Apply 1 application topically 2 (two) times daily.  Dispense: 30 g; Refill: 2  3. Tobacco use Patient is in precontemplative stage of smoking cessation. I offered assistance when he is ready to make a commitment to topping tobacco use.  Loyola Mast, MD

## 2021-10-25 ENCOUNTER — Other Ambulatory Visit: Payer: Self-pay | Admitting: Family Medicine

## 2021-10-25 DIAGNOSIS — N529 Male erectile dysfunction, unspecified: Secondary | ICD-10-CM

## 2021-10-25 MED ORDER — SILDENAFIL CITRATE 100 MG PO TABS
50.0000 mg | ORAL_TABLET | Freq: Every day | ORAL | 11 refills | Status: DC | PRN
Start: 2021-10-25 — End: 2022-11-02

## 2021-10-25 NOTE — Progress Notes (Signed)
[facility] Greene wife was in for an appointment today. She raised that her husband had recently been having issues with difficulty in achieving an erection. She notes he asks about a prescription for Viagra.

## 2021-11-14 ENCOUNTER — Ambulatory Visit (INDEPENDENT_AMBULATORY_CARE_PROVIDER_SITE_OTHER): Payer: Medicare PPO

## 2021-11-14 DIAGNOSIS — Z0000 Encounter for general adult medical examination without abnormal findings: Secondary | ICD-10-CM | POA: Diagnosis not present

## 2021-11-14 NOTE — Progress Notes (Signed)
Subjective:   Joe Greene is a 71 y.o. male who presents for an Subsequent  Medicare Annual Wellness Visit.  I connected with Joe Greene today by telephone and verified that I am speaking with the correct person using two identifiers. Location patient: home Location provider: work Persons participating in the virtual visit: patient, provider.   I discussed the limitations, risks, security and privacy concerns of performing an evaluation and management service by telephone and the availability of in person appointments. I also discussed with the patient that there may be a patient responsible charge related to this service. The patient expressed understanding and verbally consented to this telephonic visit.    Interactive audio and video telecommunications were attempted between this provider and patient, however failed, due to patient having technical difficulties OR patient did not have access to video capability.  We continued and completed visit with audio only.    Review of Systems    N/a Cardiac Risk Factors include: advanced age (>3655men, 41>65 women);male gender     Objective:    Today's Vitals   There is no height or weight on file to calculate BMI.  Advanced Directives 11/14/2021 09/18/2021 09/17/2021 09/15/2020  Does Patient Have a Medical Advance Directive? Yes - No Yes  Type of Estate agentAdvance Directive Healthcare Power of [place];Living will - - Healthcare Power of [place];Living will  Copy of Healthcare Power of Attorney in Chart? No - copy requested - - No - copy requested  Would patient like information on creating a medical advance directive? - No - Guardian declined - -    Current Medications (verified) Outpatient Encounter Medications as of 11/14/2021  Medication Sig   acetaminophen (TYLENOL) 325 MG tablet Take 2 tablets (650 mg total) by mouth every 6 (six) hours as needed for mild pain or moderate pain (or Fever >/= 101).   ibuprofen (ADVIL) 200 MG tablet Take 400 mg by mouth  every 6 (six) hours as needed for headache or mild pain.   sildenafil (VIAGRA) 100 MG tablet Take 0.5-1 tablets (50-100 mg total) by mouth daily as needed for erectile dysfunction.   hydrOXYzine (VISTARIL) 25 MG capsule Take 1 capsule (25 mg total) by mouth every 8 (eight) hours as needed.   triamcinolone cream (KENALOG) 0.1 % Apply 1 application topically 2 (two) times daily.   No facility-administered encounter medications on file as of 11/14/2021.    Allergies (verified) Patient has no known allergies.   History: Past Medical History:  Diagnosis Date   Cataract    forming    Past Surgical History:  Procedure Laterality Date   FOOT SURGERY Right    HERNIA REPAIR Right    KNEE ARTHROSCOPY Left    WISDOM TOOTH EXTRACTION     Family History  Problem Relation Age of Onset   Stroke Mother    Cancer Father        Prostate   Colon cancer Neg Hx    Colon polyps Neg Hx    Esophageal cancer Neg Hx    Rectal cancer Neg Hx    Stomach cancer Neg Hx    Social History   Socioeconomic History   Marital status: Married    Spouse name: Not on file   Number of children: 2   Years of education: Not on file   Highest education level: Not on file  Occupational History   Occupation: Retired    Comment: Scientist, 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- [employer]  Tobacco Use   Smoking status: Every Day  Packs/day: 2.00    Types: Cigarettes   Smokeless tobacco: Never  Vaping Use   Vaping Use: Never used  Substance and Sexual Activity   Alcohol use: Yes    Alcohol/week: 5.0 standard drinks    Types: 5 Standard drinks or equivalent per week    Comment: occ   Drug use: Never   Sexual activity: Yes  Other Topics Concern   Not on file  Social History Narrative   Not on file   Social Determinants of Health   Financial Resource Strain: Low Risk    Difficulty of Paying Living Expenses: Not hard at all  Food Insecurity: No Food Insecurity   Worried About Programme researcher, broadcasting/film/video in the Last Year: Never true    Ran Out of Food in the Last Year: Never true  Transportation Needs: No Transportation Needs   Lack of Transportation (Medical): No   Lack of Transportation (Non-Medical): No  Physical Activity: Sufficiently Active   Days of Exercise per Week: 6 days   Minutes of Exercise per Session: 60 min  Stress: No Stress Concern Present   Feeling of Stress : Not at all  Social Connections: Moderately Integrated   Frequency of Communication with Friends and Family: Twice a week   Frequency of Social Gatherings with Friends and Family: Twice a week   Attends Religious Services: More than 4 times per year   Active Member of [employer] or Organizations: No   Attends Engineer, structural: Never   Marital Status: Married    Tobacco Counseling Ready to quit: Not Answered Counseling given: Not Answered   Clinical Intake:  Pre-visit preparation completed: Yes  Pain : No/denies pain     Nutritional Risks: None Diabetes: No  How often do you need to have someone help you when you read instructions, pamphlets, or other written materials from your doctor or pharmacy?: 1 - Never What is the last grade level you completed in school?: PHD  Diabetic?no   Interpreter Needed?: No  Information entered by :: L.Ruston Fedora,LPN   Activities of Daily Living In your present state of health, do you have any difficulty performing the following activities: 11/14/2021 11/14/2021  Hearing? N N  Vision? N N  Difficulty concentrating or making decisions? N N  Walking or climbing stairs? N N  Dressing or bathing? N N  Doing errands, shopping? N N  Preparing Food and eating ? N N  Using the Toilet? N N  In the past six months, have you accidently leaked urine? N N  Do you have problems with loss of bowel control? N N  Managing your Medications? N N  Managing your Finances? N N  Housekeeping or managing your Housekeeping? N N  Some recent data might be hidden    Patient Care Team: Loyola Mast, MD as  PCP - General (Family Medicine)  Indicate any recent Medical Services you may have received from other than [facility] providers in the past year (date may be approximate).     Assessment:   This is a routine wellness examination for Joe Greene.  Hearing/Vision screen Vision Screening - Comments:: Annual eye exams wear glasses   Dietary issues and exercise activities discussed: Current Exercise Habits: Home exercise routine, Type of exercise: walking, Time (Minutes): 60, Frequency (Times/Week): 5, Weekly Exercise (Minutes/Week): 300, Intensity: Mild, Exercise limited by: None identified   Goals Addressed   None    Depression Screen PHQ 2/9 Scores 11/14/2021 11/14/2021 10/04/2021 09/15/2020 09/02/2020 09/02/2020  PHQ -  2 Score 0 0 0 0 0 0  PHQ- 9 Score - - - - - 0    Fall Risk Fall Risk  11/14/2021 11/14/2021 11/13/2021 10/04/2021 09/15/2020  Falls in the past year? 0 0 0 0 0  Number falls in past yr: 0 0 0 0 0  Injury with Fall? 0 0 0 0 0  Risk for fall due to : - - - No Fall Risks -  Follow up Falls evaluation completed - - Falls evaluation completed -    FALL RISK PREVENTION PERTAINING TO THE HOME:  Any stairs in or around the home? Yes  If so, are there any without handrails? No  Home free of loose throw rugs in walkways, pet beds, electrical cords, etc? Yes  Adequate lighting in your home to reduce risk of falls? Yes   ASSISTIVE DEVICES UTILIZED TO PREVENT FALLS:  Life alert? No  Use of a cane, walker or w/c? No  Grab bars in the bathroom? No  Shower chair or bench in shower? No  Elevated toilet seat or a handicapped toilet? No    Cognitive Function:  Normal cognitive status assessed by direct observation by this Nurse Health Advisor. No abnormalities found.        Immunizations Immunization History  Administered Date(s) Administered   Influenza Whole 08/09/2020   Influenza-Unspecified 08/14/2021   PFIZER(Purple Top)SARS-COV-2 Vaccination 12/14/2019, 01/09/2020, 08/14/2020    Pfizer Covid-19 Vaccine Bivalent Booster 26yrs & up 08/12/2021   Pneumococcal Polysaccharide-23 09/02/2020   Tdap 09/02/2020    TDAP status: Up to date  Flu Vaccine status: Up to date  Pneumococcal vaccine status: Up to date  Covid-19 vaccine status: Completed vaccines  Qualifies for Shingles Vaccine? Yes   Zostavax completed No   Shingrix Completed?: No.    Education has been provided regarding the importance of this vaccine. Patient has been advised to call insurance company to determine out of pocket expense if they have not yet received this vaccine. Advised may also receive vaccine at local pharmacy or Health Dept. Verbalized acceptance and understanding.  Screening Tests Health Maintenance  Topic Date Due   Hepatitis C Screening  Never done   Zoster Vaccines- Shingrix (1 of 2) Never done   Pneumonia Vaccine 2+ Years old (2 - PCV) 09/02/2021   TETANUS/TDAP  09/02/2030   COLONOSCOPY (Pts 45-30yrs Insurance coverage will need to be confirmed)  12/08/2030   INFLUENZA VACCINE  Completed   COVID-19 Vaccine  Completed   HPV VACCINES  Aged Out    Health Maintenance  Health Maintenance Due  Topic Date Due   Hepatitis C Screening  Never done   Zoster Vaccines- Shingrix (1 of 2) Never done   Pneumonia Vaccine 57+ Years old (2 - PCV) 09/02/2021    Colorectal cancer screening: Type of screening: Colonoscopy. Completed 12/08/2020. Repeat every 10 years  Lung Cancer Screening: (Low Dose CT Chest recommended if Age 28-80 years, 30 pack-year currently smoking OR have quit w/in 15years.) does not qualify.   Lung Cancer Screening Referral: n/a  Additional Screening:  Hepatitis C Screening: does not qualify;   Vision Screening: Recommended annual ophthalmology exams for early detection of glaucoma and other disorders of the eye. Is the patient up to date with their annual eye exam?  Yes  Who is the provider or what is the name of the office in which the patient attends annual  eye exams? [facility]. If pt is not established with a provider, would they like to  be referred to a provider to establish care? No .   Dental Screening: Recommended annual dental exams for proper oral hygiene  Community Resource Referral / Chronic Care Management: CRR required this visit?  No   CCM required this visit?  No      Plan:     I have personally reviewed and noted the following in the patients chart:   Medical and social history Use of alcohol, tobacco or illicit drugs  Current medications and supplements including opioid prescriptions. Patient is not currently taking opioid prescriptions. Functional ability and status Nutritional status Physical activity Advanced directives List of other physicians Hospitalizations, surgeries, and ER visits in previous 12 months Vitals Screenings to include cognitive, depression, and falls Referrals and appointments  In addition, I have reviewed and discussed with patient certain preventive protocols, quality metrics, and best practice recommendations. A written personalized care plan for preventive services as well as general preventive health recommendations were provided to patient.     March RummageLaura Irais Mottram, LPN   1/6/10961/01/2022   Nurse Notes: none

## 2021-11-14 NOTE — Patient Instructions (Signed)
[name] , Thank you for taking time to come for your Medicare Wellness Visit. I appreciate your ongoing commitment to your health goals. Please review the following plan we discussed and let me know if I can assist you in the future.   Screening recommendations/referrals: Colonoscopy: 12/08/2020 Recommended yearly ophthalmology/optometry visit for glaucoma screening and checkup Recommended yearly dental visit for hygiene and checkup  Vaccinations: Influenza vaccine: completed  Pneumococcal vaccine: completed  Tdap vaccine: 09/02/2020 Shingles vaccine: will consider     Advanced directives: yes   Conditions/risks identified: none   Next appointment: none   Preventive Care 65 Years and Older, Male Preventive care refers to lifestyle choices and visits with your health care provider that can promote health and wellness. What does preventive care include? A yearly physical exam. This is also called an annual well check. Dental exams once or twice a year. Routine eye exams. Ask your health care provider how often you should have your eyes checked. Personal lifestyle choices, including: Daily care of your teeth and gums. Regular physical activity. Eating a healthy diet. Avoiding tobacco and drug use. Limiting alcohol use. Practicing safe sex. Taking low doses of aspirin every day. Taking vitamin and mineral supplements as recommended by your health care provider. What happens during an annual well check? The services and screenings done by your health care provider during your annual well check will depend on your age, overall health, lifestyle risk factors, and family history of disease. Counseling  Your health care provider may ask you questions about your: Alcohol use. Tobacco use. Drug use. Emotional well-being. Home and relationship well-being. Sexual activity. Eating habits. History of falls. Memory and ability to understand (cognition). Work and work  Astronomer. Screening  You may have the following tests or measurements: Height, weight, and BMI. Blood pressure. Lipid and cholesterol levels. These may be checked every 5 years, or more frequently if you are over 31 years old. Skin check. Lung cancer screening. You may have this screening every year starting at age 3 if you have a 30-pack-year history of smoking and currently smoke or have quit within the past 15 years. Fecal occult blood test (FOBT) of the stool. You may have this test every year starting at age 93. Flexible sigmoidoscopy or colonoscopy. You may have a sigmoidoscopy every 5 years or a colonoscopy every 10 years starting at age 56. Prostate cancer screening. Recommendations will vary depending on your family history and other risks. Hepatitis C blood test. Hepatitis B blood test. Sexually transmitted disease (STD) testing. Diabetes screening. This is done by checking your blood sugar (glucose) after you have not eaten for a while (fasting). You may have this done every 1-3 years. Abdominal aortic aneurysm (AAA) screening. You may need this if you are a current or former smoker. Osteoporosis. You may be screened starting at age 60 if you are at high risk. Talk with your health care provider about your test results, treatment options, and if necessary, the need for more tests. Vaccines  Your health care provider may recommend certain vaccines, such as: Influenza vaccine. This is recommended every year. Tetanus, diphtheria, and acellular pertussis (Tdap, Td) vaccine. You may need a Td booster every 10 years. Zoster vaccine. You may need this after age 3. Pneumococcal 13-valent conjugate (PCV13) vaccine. One dose is recommended after age 70. Pneumococcal polysaccharide (PPSV23) vaccine. One dose is recommended after age 32. Talk to your health care provider about which screenings and vaccines you need and how often you  need them. This information is not intended to replace  advice given to you by your health care provider. Make sure you discuss any questions you have with your health care provider. Document Released: 11/25/2015 Document Revised: 07/18/2016 Document Reviewed: 08/30/2015 Elsevier Interactive Patient Education  2017 [place] Prevention in the Home Falls can cause injuries. They can happen to people of all ages. There are many things you can do to make your home safe and to help prevent falls. What can I do on the outside of my home? Regularly fix the edges of walkways and driveways and fix any cracks. Remove anything that might make you trip as you walk through a door, such as a raised step or threshold. Trim any bushes or trees on the path to your home. Use bright outdoor lighting. Clear any walking paths of anything that might make someone trip, such as rocks or tools. Regularly check to see if handrails are loose or broken. Make sure that both sides of any steps have handrails. Any raised decks and porches should have guardrails on the edges. Have any leaves, snow, or ice cleared regularly. Use sand or salt on walking paths during winter. Clean up any spills in your garage right away. This includes oil or grease spills. What can I do in the bathroom? Use night lights. Install grab bars by the toilet and in the tub and shower. Do not use towel bars as grab bars. Use non-skid mats or decals in the tub or shower. If you need to sit down in the shower, use a plastic, non-slip stool. Keep the floor dry. Clean up any water that spills on the floor as soon as it happens. Remove soap buildup in the tub or shower regularly. Attach bath mats securely with double-sided non-slip rug tape. Do not have throw rugs and other things on the floor that can make you trip. What can I do in the bedroom? Use night lights. Make sure that you have a light by your bed that is easy to reach. Do not use any sheets or blankets that are too big for your bed.  They should not hang down onto the floor. Have a firm chair that has side arms. You can use this for support while you get dressed. Do not have throw rugs and other things on the floor that can make you trip. What can I do in the kitchen? Clean up any spills right away. Avoid walking on wet floors. Keep items that you use a lot in easy-to-reach places. If you need to reach something above you, use a strong step stool that has a grab bar. Keep electrical cords out of the way. Do not use floor polish or wax that makes floors slippery. If you must use wax, use non-skid floor wax. Do not have throw rugs and other things on the floor that can make you trip. What can I do with my stairs? Do not leave any items on the stairs. Make sure that there are handrails on both sides of the stairs and use them. Fix handrails that are broken or loose. Make sure that handrails are as long as the stairways. Check any carpeting to make sure that it is firmly attached to the stairs. Fix any carpet that is loose or worn. Avoid having throw rugs at the top or bottom of the stairs. If you do have throw rugs, attach them to the floor with carpet tape. Make sure that you have a light switch  at the top of the stairs and the bottom of the stairs. If you do not have them, ask someone to add them for you. What else can I do to help prevent falls? Wear shoes that: Do not have high heels. Have rubber bottoms. Are comfortable and fit you well. Are closed at the toe. Do not wear sandals. If you use a stepladder: Make sure that it is fully opened. Do not climb a closed stepladder. Make sure that both sides of the stepladder are locked into place. Ask someone to hold it for you, if possible. Clearly mark and make sure that you can see: Any grab bars or handrails. First and last steps. Where the edge of each step is. Use tools that help you move around (mobility aids) if they are needed. These  include: Canes. Walkers. Scooters. Crutches. Turn on the lights when you go into a dark area. Replace any light bulbs as soon as they burn out. Set up your furniture so you have a clear path. Avoid moving your furniture around. If any of your floors are uneven, fix them. If there are any pets around you, be aware of where they are. Review your medicines with your doctor. Some medicines can make you feel dizzy. This can increase your chance of falling. Ask your doctor what other things that you can do to help prevent falls. This information is not intended to replace advice given to you by your health care provider. Make sure you discuss any questions you have with your health care provider. Document Released: 08/25/2009 Document Revised: 04/05/2016 Document Reviewed: 12/03/2014 Elsevier Interactive Patient Education  2017 [employer].

## 2021-11-30 DIAGNOSIS — R030 Elevated blood-pressure reading, without diagnosis of hypertension: Secondary | ICD-10-CM | POA: Diagnosis not present

## 2021-11-30 DIAGNOSIS — Z8042 Family history of malignant neoplasm of prostate: Secondary | ICD-10-CM | POA: Diagnosis not present

## 2021-11-30 DIAGNOSIS — H3530 Unspecified macular degeneration: Secondary | ICD-10-CM | POA: Diagnosis not present

## 2021-11-30 DIAGNOSIS — Z818 Family history of other mental and behavioral disorders: Secondary | ICD-10-CM | POA: Diagnosis not present

## 2021-11-30 DIAGNOSIS — F17210 Nicotine dependence, cigarettes, uncomplicated: Secondary | ICD-10-CM | POA: Diagnosis not present

## 2021-11-30 DIAGNOSIS — Z8249 Family history of ischemic heart disease and other diseases of the circulatory system: Secondary | ICD-10-CM | POA: Diagnosis not present

## 2021-11-30 DIAGNOSIS — Z823 Family history of stroke: Secondary | ICD-10-CM | POA: Diagnosis not present

## 2022-01-02 ENCOUNTER — Other Ambulatory Visit: Payer: Self-pay | Admitting: *Deleted

## 2022-01-02 DIAGNOSIS — Z87891 Personal history of nicotine dependence: Secondary | ICD-10-CM

## 2022-01-02 DIAGNOSIS — F17210 Nicotine dependence, cigarettes, uncomplicated: Secondary | ICD-10-CM

## 2022-01-17 ENCOUNTER — Ambulatory Visit
Admission: RE | Admit: 2022-01-17 | Discharge: 2022-01-17 | Disposition: A | Discharge status: Home / Self Care | Payer: Medicare PPO | Source: Ambulatory Visit | Attending: Acute Care | Admitting: Acute Care

## 2022-01-17 DIAGNOSIS — Z87891 Personal history of nicotine dependence: Secondary | ICD-10-CM

## 2022-01-17 DIAGNOSIS — F17210 Nicotine dependence, cigarettes, uncomplicated: Secondary | ICD-10-CM | POA: Diagnosis not present

## 2022-01-18 ENCOUNTER — Other Ambulatory Visit: Payer: Self-pay

## 2022-01-18 ENCOUNTER — Encounter: Payer: Self-pay | Admitting: Family Medicine

## 2022-01-18 DIAGNOSIS — I2584 Coronary atherosclerosis due to calcified coronary lesion: Secondary | ICD-10-CM | POA: Insufficient documentation

## 2022-01-18 DIAGNOSIS — Z87891 Personal history of nicotine dependence: Secondary | ICD-10-CM

## 2022-01-18 DIAGNOSIS — F17210 Nicotine dependence, cigarettes, uncomplicated: Secondary | ICD-10-CM

## 2022-01-18 DIAGNOSIS — I2510 Atherosclerotic heart disease of native coronary artery without angina pectoris: Secondary | ICD-10-CM | POA: Insufficient documentation

## 2022-01-23 ENCOUNTER — Ambulatory Visit: Payer: Medicare PPO | Admitting: Family Medicine

## 2022-03-16 ENCOUNTER — Ambulatory Visit: Payer: Medicare PPO | Admitting: Family Medicine

## 2022-03-16 ENCOUNTER — Encounter: Payer: Self-pay | Admitting: Family Medicine

## 2022-03-16 VITALS — BP 138/80 | HR 72 | Temp 97.8°F | Ht 70.0 in | Wt 213.6 lb

## 2022-03-16 DIAGNOSIS — L291 Pruritus scroti: Secondary | ICD-10-CM | POA: Diagnosis not present

## 2022-03-16 MED ORDER — HYDROXYZINE PAMOATE 25 MG PO CAPS: 25.0000 mg | ORAL_CAPSULE | Freq: Three times a day (TID) | ORAL | 2 refills | Status: DC | PRN

## 2022-03-16 NOTE — Patient Instructions (Signed)
Use hydrophilic ointment on the scrotum twice a day ?Avoid scratching the scrotum ?Avoid using soap on the scrotum. ?Pat dry after baths/showers ?

## 2022-03-16 NOTE — Progress Notes (Signed)
?[facility] ?[facility] ?[address] ?[place] [address] ?Dept: [phone] ?Dept Fax: [phone] ? ?Office Visit ? ?Subjective:  ? ? Patient ID: Joe Greene, male    DOB: December 26, 1950, 71 y.o..   MRN: [id number] ? ?Chief Complaint  ?Patient presents with  ? Acute Visit  ?  C/o having some itching in the scrotum x 4 days.  He has been using Zinc Oxide cream with little relief.    ? ? ?History of Present Illness: ? ?Patient is in today for with a 4-day complaint of scrotal itching. He has not been able to identify any rash. He is uncertain of the cause. He has been applying zinc oxide, but is not sure this is helping. He had a cellulitis last year, so is nervous about that recurring. ? ?Past Medical History: ?Patient Active Problem List  ? Diagnosis Date Noted  ? Coronary artery calcification 01/18/2022  ? Candidal intertrigo   ? Emphysema of lung (HCC) 07/26/2021  ? Aortic atherosclerosis (HCC) 07/26/2021  ? Internal hemorrhoids 01/23/2021  ? Diverticula of colon 01/23/2021  ? Eczema 01/23/2021  ? Actinic keratoses 01/23/2021  ? Tobacco use 09/02/2020  ? ?Past Surgical History:  ?Procedure Laterality Date  ? FOOT SURGERY Right   ? HERNIA REPAIR Right   ? KNEE ARTHROSCOPY Left   ? WISDOM TOOTH EXTRACTION    ? ?Family History  ?Problem Relation Age of Onset  ? Stroke Mother   ? Cancer Father   ?     Prostate  ? Colon cancer Neg Hx   ? Colon polyps Neg Hx   ? Esophageal cancer Neg Hx   ? Rectal cancer Neg Hx   ? Stomach cancer Neg Hx   ? ?Outpatient Medications Prior to Visit  ?Medication Sig Dispense Refill  ? acetaminophen (TYLENOL) 325 MG tablet Take 2 tablets (650 mg total) by mouth every 6 (six) hours as needed for mild pain or moderate pain (or Fever >/= 101).    ? ibuprofen (ADVIL) 200 MG tablet Take 400 mg by mouth every 6 (six) hours as needed for headache or mild pain.    ? sildenafil (VIAGRA) 100 MG tablet Take 0.5-1 tablets (50-100 mg total) by mouth daily as  needed for erectile dysfunction. 5 tablet 11  ? hydrOXYzine (VISTARIL) 25 MG capsule Take 1 capsule (25 mg total) by mouth every 8 (eight) hours as needed. 30 capsule 0  ? triamcinolone cream (KENALOG) 0.1 % Apply 1 application topically 2 (two) times daily. 30 g 2  ? ?No facility-administered medications prior to visit.  ? ?No Known Allergies ?   ?Objective:  ? ?Today's Vitals  ? 03/16/22 1059  ?BP: 138/80  ?Pulse: 72  ?Temp: 97.8 ?F (36.6 ?C)  ?TempSrc: Temporal  ?SpO2: 98%  ?Weight: 213 lb 9.6 oz (96.9 kg)  ?Height: 5\' 10"  (1.778 m)  ? ?Body mass index is 30.65 kg/m?.  ? ?General: Well developed, well nourished. No acute distress. ?GU: Normal male external genitalia. The scrotum appears normal. There is no rash noted. ?Psych: Alert and oriented. Normal mood and affect. ? ?Health Maintenance Due  ?Topic Date Due  ? Hepatitis C Screening  Never done  ? Zoster Vaccines- Shingrix (1 of 2) Never done  ? Pneumonia Vaccine 70+ Years old (2 - PCV) 09/02/2021  ?   ?Assessment & Plan:  ? ?1. Scrotal itching ?Use hydrophilic ointment on the scrotum twice a day ?Avoid scratching the scrotum ?Avoid using soap on the scrotum. ?  Pat dry after baths/showers ? ?- hydrOXYzine (VISTARIL) 25 MG capsule; Take 1 capsule (25 mg total) by mouth every 8 (eight) hours as needed.  Dispense: 30 capsule; Refill: 2 ? ? ?Return if symptoms worsen or fail to improve.  ? ?Loyola Mast, MD ?

## 2022-04-03 ENCOUNTER — Ambulatory Visit: Payer: Medicare PPO | Admitting: Family Medicine

## 2022-04-03 ENCOUNTER — Encounter: Payer: Self-pay | Admitting: Family Medicine

## 2022-04-03 VITALS — BP 124/76 | HR 62 | Temp 97.4°F | Ht 70.0 in | Wt 213.2 lb

## 2022-04-03 DIAGNOSIS — Z125 Encounter for screening for malignant neoplasm of prostate: Secondary | ICD-10-CM | POA: Diagnosis not present

## 2022-04-03 DIAGNOSIS — D649 Anemia, unspecified: Secondary | ICD-10-CM

## 2022-04-03 DIAGNOSIS — Z23 Encounter for immunization: Secondary | ICD-10-CM

## 2022-04-03 DIAGNOSIS — E781 Pure hyperglyceridemia: Secondary | ICD-10-CM | POA: Insufficient documentation

## 2022-04-03 LAB — LIPID PANEL
Cholesterol: 173 mg/dL (ref 0–200)
HDL: 32.3 mg/dL — ABNORMAL LOW (ref >39.00)
NonHDL: 140.57
Total CHOL/HDL Ratio: 5
Triglycerides: 315 mg/dL — ABNORMAL HIGH (ref 0.0–149.0)
VLDL: 63 mg/dL — ABNORMAL HIGH (ref 0.0–40.0)

## 2022-04-03 LAB — RETICULOCYTES
ABS Retic: 84320 cells/uL (ref 25000–90000)
Retic Ct Pct: 1.7 %

## 2022-04-03 LAB — PSA: PSA: 0.8 ng/mL (ref 0.10–4.00)

## 2022-04-03 LAB — CBC
HCT: 45.8 % (ref 39.0–52.0)
Hemoglobin: 15.5 g/dL (ref 13.0–17.0)
MCHC: 33.9 g/dL (ref 30.0–36.0)
MCV: 95.4 fl (ref 78.0–100.0)
Platelets: 186 10*3/uL (ref 150.0–400.0)
RBC: 4.8 Mil/uL (ref 4.22–5.81)
RDW: 13.8 % (ref 11.5–15.5)
WBC: 9.5 10*3/uL (ref 4.0–10.5)

## 2022-04-03 LAB — LDL CHOLESTEROL, DIRECT: Direct LDL: 86 mg/dL

## 2022-04-03 NOTE — Progress Notes (Signed)
[facility] [address] [place] [address] Dept: [phone] Dept Fax: [phone]  Chronic Care Office Visit  Subjective:    Patient ID: Joe Greene, male    DOB: 05-16-1951, 71 y.o..   MRN: [id number]  Chief Complaint  Patient presents with   Follow-up    6 month f/u.  Fasting today.   Had low hemoglobin when trying to give blood.     History of Present Illness:  Patient is in today for reassessment of chronic medical issues.  Joe Greene has a history of hypertriglyceridemia. He continues to smoke and is not interested in cessation. He does have an annual LDCT for lung cancer screening.   Joe Greene notes he donates blood about every 3 months. At his last attempt, he was told his Hgb was 12, which was down from his usual of 16. He denies any other sources of blood loss other than donation of blood. He has had no rectal bleeding or melana. He had a colonoscopy in 1/ 2022. It showed a few diverticula and small internal hemorrhoids.  Past Medical History: Patient Active Problem List   Diagnosis Date Noted   Hypertriglyceridemia 04/03/2022   Coronary artery calcification 01/18/2022   Candidal intertrigo    Emphysema of lung (HCC) 07/26/2021   Aortic atherosclerosis (HCC) 07/26/2021   Internal hemorrhoids 01/23/2021   Diverticula of colon 01/23/2021   Eczema 01/23/2021   Actinic keratoses 01/23/2021   Tobacco use 09/02/2020   Past Surgical History:  Procedure Laterality Date   FOOT SURGERY Right    HERNIA REPAIR Right    KNEE ARTHROSCOPY Left    WISDOM TOOTH EXTRACTION     Family History  Problem Relation Age of Onset   Stroke Mother    Cancer Father        Prostate   Colon cancer Neg Hx    Colon polyps Neg Hx    Esophageal cancer Neg Hx    Rectal cancer Neg Hx    Stomach cancer Neg Hx    Outpatient Medications Prior to Visit  Medication Sig Dispense Refill   hydrOXYzine (VISTARIL) 25 MG capsule Take 1  capsule (25 mg total) by mouth every 8 (eight) hours as needed. 30 capsule 2   ibuprofen (ADVIL) 200 MG tablet Take 400 mg by mouth every 6 (six) hours as needed for headache or mild pain.     sildenafil (VIAGRA) 100 MG tablet Take 0.5-1 tablets (50-100 mg total) by mouth daily as needed for erectile dysfunction. 5 tablet 11   acetaminophen (TYLENOL) 325 MG tablet Take 2 tablets (650 mg total) by mouth every 6 (six) hours as needed for mild pain or moderate pain (or Fever >/= 101).     No facility-administered medications prior to visit.   No Known Allergies    Objective:   Today's Vitals   04/03/22 0925  BP: 124/76  Pulse: 62  Temp: (!) 97.4 F (36.3 C)  TempSrc: Temporal  SpO2: 95%  Weight: 213 lb 3.2 oz (96.7 kg)  Height: 5\' 10"  (1.778 m)   Body mass index is 30.59 kg/m.   General: Well developed, well nourished. No acute distress. Psych: Alert and oriented. Normal mood and affect.  Health Maintenance Due  Topic Date Due   Hepatitis C Screening  Never done   Zoster Vaccines- Shingrix (1 of 2) Never done   Pneumonia Vaccine 63+ Years old (2 - PCV) 09/02/2021   Lab Results    Latest Ref  Rng & Units 09/19/2021    5:00 AM 09/18/2021    2:21 AM 09/17/2021    3:43 PM  CBC  WBC 4.0 - 10.5 K/uL 9.1   10.0   11.1    Hemoglobin 13.0 - 17.0 g/dL 03.4   74.2   59.5    Hematocrit 39.0 - 52.0 % 38.9   37.8   42.9    Platelets 150 - 400 K/uL 208   213   245     Lab Results  Component Value Date   CHOL 185 09/02/2020   HDL 32.30 (L) 09/02/2020   LDLDIRECT 104.0 09/02/2020   TRIG 301.0 (H) 09/02/2020   CHOLHDL 6 09/02/2020     Imaging: CT Chest Lung Cancer Screening (01/17/2022) IMPRESSION: 1. Lung-RADS 2, benign appearance or behavior. Continue annual screening with low-dose chest CT without contrast in 12 months. 2. Tiny left renal stones.  3. Aortic atherosclerosis (ICD10-I70.0). Coronary artery calcification. 4. Enlarged pulmonic trunk, indicative of pulmonary arterial  hypertension. 5.  Emphysema (ICD10-J43.9).  Assessment & Plan:   1. Hypertriglyceridemia It has been 18 months since Joe Greene last lipids. His triglycerides have been high, As he continues to smoke, I want to make sure the lipids remain acceptable.  - Lipid panel  2. Anemia, unspecified type I will check a CBC. It is possible that routine blood donation has led to some iron deficiency. I will check an iron profile and a reticulocyte count.  - CBC - Iron, TIBC and Ferritin Panel - Reticulocytes; Future  3. Screening for prostate cancer  - PSA  4. Need for pneumococcal vaccination  - Pneumococcal conjugate vaccine 13-valent IM  Return in about 6 months (around 10/04/2022) for Reassessment.   Loyola Mast, MD

## 2022-04-04 ENCOUNTER — Encounter: Payer: Self-pay | Admitting: Family Medicine

## 2022-04-04 DIAGNOSIS — E611 Iron deficiency: Secondary | ICD-10-CM | POA: Insufficient documentation

## 2022-04-04 LAB — IRON,TIBC AND FERRITIN PANEL
Ferritin: 32 ng/mL (ref 24–380)
Iron: <10 ug/dL — ABNORMAL LOW (ref 50–180)

## 2022-05-16 DIAGNOSIS — L570 Actinic keratosis: Secondary | ICD-10-CM | POA: Diagnosis not present

## 2022-05-16 DIAGNOSIS — L821 Other seborrheic keratosis: Secondary | ICD-10-CM | POA: Diagnosis not present

## 2022-05-16 DIAGNOSIS — L84 Corns and callosities: Secondary | ICD-10-CM | POA: Diagnosis not present

## 2022-05-16 DIAGNOSIS — L814 Other melanin hyperpigmentation: Secondary | ICD-10-CM | POA: Diagnosis not present

## 2022-05-16 DIAGNOSIS — D225 Melanocytic nevi of trunk: Secondary | ICD-10-CM | POA: Diagnosis not present

## 2022-05-16 DIAGNOSIS — T07XXXA Unspecified multiple injuries, initial encounter: Secondary | ICD-10-CM | POA: Diagnosis not present

## 2022-09-21 ENCOUNTER — Ambulatory Visit: Payer: Medicare PPO | Admitting: Family Medicine

## 2022-09-21 ENCOUNTER — Emergency Department (HOSPITAL_BASED_OUTPATIENT_CLINIC_OR_DEPARTMENT_OTHER): Payer: Medicare PPO

## 2022-09-21 ENCOUNTER — Encounter (HOSPITAL_BASED_OUTPATIENT_CLINIC_OR_DEPARTMENT_OTHER): Payer: Self-pay | Admitting: Emergency Medicine

## 2022-09-21 ENCOUNTER — Other Ambulatory Visit: Payer: Self-pay

## 2022-09-21 ENCOUNTER — Emergency Department (HOSPITAL_BASED_OUTPATIENT_CLINIC_OR_DEPARTMENT_OTHER)
Admission: EM | Admit: 2022-09-21 | Discharge: 2022-09-21 | Disposition: A | Discharge status: Home / Self Care | Payer: Medicare PPO | Attending: Emergency Medicine | Admitting: Emergency Medicine

## 2022-09-21 ENCOUNTER — Telehealth: Payer: Self-pay

## 2022-09-21 VITALS — BP 162/92 | HR 95 | Temp 97.8°F | Wt 218.6 lb

## 2022-09-21 DIAGNOSIS — R6883 Chills (without fever): Secondary | ICD-10-CM

## 2022-09-21 DIAGNOSIS — R52 Pain, unspecified: Secondary | ICD-10-CM | POA: Diagnosis not present

## 2022-09-21 DIAGNOSIS — G6289 Other specified polyneuropathies: Secondary | ICD-10-CM | POA: Insufficient documentation

## 2022-09-21 DIAGNOSIS — R519 Headache, unspecified: Secondary | ICD-10-CM | POA: Insufficient documentation

## 2022-09-21 DIAGNOSIS — R209 Unspecified disturbances of skin sensation: Secondary | ICD-10-CM | POA: Insufficient documentation

## 2022-09-21 DIAGNOSIS — R059 Cough, unspecified: Secondary | ICD-10-CM

## 2022-09-21 DIAGNOSIS — R0602 Shortness of breath: Secondary | ICD-10-CM | POA: Diagnosis not present

## 2022-09-21 DIAGNOSIS — R42 Dizziness and giddiness: Secondary | ICD-10-CM | POA: Diagnosis not present

## 2022-09-21 DIAGNOSIS — Z760 Encounter for issue of repeat prescription: Secondary | ICD-10-CM | POA: Diagnosis not present

## 2022-09-21 LAB — URINALYSIS, ROUTINE W REFLEX MICROSCOPIC
Bilirubin Urine: NEGATIVE
Glucose, UA: NEGATIVE mg/dL
Hgb urine dipstick: NEGATIVE
Ketones, ur: NEGATIVE mg/dL
Leukocytes,Ua: NEGATIVE
Nitrite: NEGATIVE
Protein, ur: 30 mg/dL — AB
Specific Gravity, Urine: 1.025 (ref 1.005–1.030)
pH: 7 (ref 5.0–8.0)

## 2022-09-21 LAB — URINALYSIS, MICROSCOPIC (REFLEX)

## 2022-09-21 LAB — CBC
HCT: 46.9 % (ref 39.0–52.0)
Hemoglobin: 16.3 g/dL (ref 13.0–17.0)
MCH: 33.6 pg (ref 26.0–34.0)
MCHC: 34.8 g/dL (ref 30.0–36.0)
MCV: 96.7 fL (ref 80.0–100.0)
Platelets: 235 10*3/uL (ref 150–400)
RBC: 4.85 MIL/uL (ref 4.22–5.81)
RDW: 13 % (ref 11.5–15.5)
WBC: 7.2 10*3/uL (ref 4.0–10.5)
nRBC: 0 % (ref 0.0–0.2)

## 2022-09-21 LAB — BASIC METABOLIC PANEL
Anion gap: 10 (ref 5–15)
BUN: 19 mg/dL (ref 8–23)
CO2: 22 mmol/L (ref 22–32)
Calcium: 8.9 mg/dL (ref 8.9–10.3)
Chloride: 104 mmol/L (ref 98–111)
Creatinine, Ser: 1.02 mg/dL (ref 0.61–1.24)
GFR, Estimated: >60 mL/min (ref >60)
Glucose, Bld: 131 mg/dL — ABNORMAL HIGH (ref 70–99)
Potassium: 4 mmol/L (ref 3.5–5.1)
Sodium: 136 mmol/L (ref 135–145)

## 2022-09-21 LAB — POCT INFLUENZA A/B
Influenza A, POC: NEGATIVE
Influenza B, POC: NEGATIVE

## 2022-09-21 LAB — POC COVID19 BINAXNOW: SARS Coronavirus 2 Ag: NEGATIVE

## 2022-09-21 NOTE — Patient Instructions (Signed)
Please go to   [facility] Department  [address], [address], [place], [place] [address]

## 2022-09-21 NOTE — ED Notes (Signed)
Pt left before receiving discharge instructions.

## 2022-09-21 NOTE — Progress Notes (Signed)
Assessment/Plan:   Problem List Items Addressed This Visit   None Visit Diagnoses     Body aches    -  Primary   Relevant Orders   POC COVID-19 (Completed)   POCT Influenza A/B (Completed)   Intractable headache, unspecified chronicity pattern, unspecified headache type          Patient with progressively worsening illness.  Does have a history of emphysema and wheezing on exam.  Concern for possible bronchitis versus pneumonia.  However, most worrisome symptoms are patient's rapidly worsening headache with sudden onset dizziness, and change in sensation in limbs Although alert, normal neuro exam, patient's dizziness and difficulty ambulating along with hypertension and progressively worsening headache concerning for intra cranial hemorrhage versus CVA Recommend patient go to the emergency department, they agree to go, but declined emergency transport, patient's wife is driving him, they are referred to [facility]    Subjective:  HPI:  Joe Greene is a 71 y.o. male who has Tobacco use; Internal hemorrhoids; Diverticula of colon; Eczema; Actinic keratoses; Emphysema of lung ([place]); Aortic atherosclerosis ([place]); Candidal intertrigo; Coronary artery calcification; Hypertriglyceridemia; and Iron deficiency on their problem list..   He  has a past medical history of Cataract.Marland Kitchen   He presents with chief complaint of Headache (Headache x 1 week. Finger tingling and limb numbness x 1 day. B/p has been elevated. Sinus pressure. ) .   Patient presents with 1 week of worsening cough, sinus pressure on the right side, chills, and headache.  Over the last 24 hours patient has had significantly worsening headache, shortness of breath, and dizziness.  He finds it difficult to walk without assistance.   In the past 24 hours he states that he has also developed numbness in his fingers and that all of his limbs feel "heavy" and are very painful and weak.  He has also had shortness of breath,  particularly last night.  Patient reports that he has had significantly elevated blood pressures at home in the 160s over 90s.  He denies any blurry vision, facial numbness, dysarthria, confusion, loss of conscious, seizure, chest pain.  Patient has been taking ibuprofen "like candy" without any improvement in headache.  Patient's wife is with him in the room and states "that he is not like himself ".  She is very concerned.  Patient tested and flu negative.  Past Surgical History:  Procedure Laterality Date   FOOT SURGERY Right    HERNIA REPAIR Right    KNEE ARTHROSCOPY Left    WISDOM TOOTH EXTRACTION      Outpatient Medications Prior to Visit  Medication Sig Dispense Refill   ibuprofen (ADVIL) 200 MG tablet Take 400 mg by mouth every 6 (six) hours as needed for headache or mild pain.     sildenafil (VIAGRA) 100 MG tablet Take 0.5-1 tablets (50-100 mg total) by mouth daily as needed for erectile dysfunction. 5 tablet 11   UNABLE TO FIND Med Name: IRON OTC     hydrOXYzine (VISTARIL) 25 MG capsule Take 1 capsule (25 mg total) by mouth every 8 (eight) hours as needed. (Patient not taking: Reported on 09/21/2022) 30 capsule 2   No facility-administered medications prior to visit.    Family History  Problem Relation Age of Onset   Stroke Mother    Cancer Father        Prostate   Colon cancer Neg Hx    Colon polyps Neg Hx    Esophageal cancer Neg Hx  Rectal cancer Neg Hx    Stomach cancer Neg Hx     Social History   Socioeconomic History   Marital status: Married    Spouse name: Not on file   Number of children: 2   Years of education: Not on file   Highest education level: Not on file  Occupational History   Occupation: Retired    Comment: Scientist, research (medical)- [employer]  Tobacco Use   Smoking status: Every Day    Packs/day: 2.00    Types: Cigarettes   Smokeless tobacco: Never  Vaping Use   Vaping Use: Never used  Substance and Sexual Activity   Alcohol use: Yes     Alcohol/week: 5.0 standard drinks of alcohol    Types: 5 Standard drinks or equivalent per week    Comment: occ   Drug use: Never   Sexual activity: Yes  Other Topics Concern   Not on file  Social History Narrative   Not on file   Social Determinants of Health   Financial Resource Strain: Low Risk  (11/14/2021)   Overall Financial Resource Strain (CARDIA)    Difficulty of Paying Living Expenses: Not hard at all  Food Insecurity: No Food Insecurity (11/14/2021)   Hunger Vital Sign    Worried About Running Out of Food in the Last Year: Never true    Ran Out of Food in the Last Year: Never true  Transportation Needs: No Transportation Needs (11/14/2021)   PRAPARE - Administrator, Civil Service (Medical): No    Lack of Transportation (Non-Medical): No  Physical Activity: Sufficiently Active (11/14/2021)   Exercise Vital Sign    Days of Exercise per Week: 6 days    Minutes of Exercise per Session: 60 min  Stress: No Stress Concern Present (11/14/2021)   [employer] of Occupational Health - Occupational Stress Questionnaire    Feeling of Stress : Not at all  Social Connections: Moderately Integrated (11/14/2021)   Social Connection and Isolation Panel [NHANES]    Frequency of Communication with Friends and Family: Twice a week    Frequency of Social Gatherings with Friends and Family: Twice a week    Attends Religious Services: More than 4 times per year    Active Member of [employer] or Organizations: No    Attends Banker Meetings: Never    Marital Status: Married  Catering manager Violence: Not At Risk (11/14/2021)   Humiliation, Afraid, Rape, and Kick questionnaire    Fear of Current or Ex-Partner: No    Emotionally Abused: No    Physically Abused: No    Sexually Abused: No                                                                                                 Objective:  Physical Exam: BP (!) 162/92 (BP Location: Left Arm, Patient Position:  Sitting, Cuff Size: Large)   Pulse 95   Temp 97.8 F (36.6 C) (Temporal)   Wt 218 lb 9.6 oz (99.2 kg)   SpO2 97%   BMI 31.37 kg/m    General: Appears  ill Eyes: Normal conjunctiva, anicteric.  PERRLA, EOMI, no nystagmus ENT: Hearing grossly intact. No nasal discharge.  Bilateral tympanic membranes pearly without effusion, mobile tongue Neck: Neck is supple. No masses or thyromegaly.  Respiratory: Wheeze heard in left mid and lower lung fields Skin: Warm. No rashes or ulcers.  Psych: Alert and oriented.  Normal judgment.  CV: RRR, no MRG MSK: Has difficulty ambulating from chair to exam table a few feet away due to dizziness, 5 of 5 strength in upper and lower extremities Neuro: Sensation intact throughout face, and upper and lower extremities, no tremor, and CN II-XII grossly normal.        Alesia Banda, MD, MS

## 2022-09-21 NOTE — ED Notes (Signed)
Patient transported to CT 

## 2022-09-21 NOTE — Discharge Instructions (Signed)
Today you were seen in the emergency department for your headache and numbness and tingling of your hands and feet.    In the emergency department you had a CT scan and lab work that was reassuring.    At home, please take Tylenol and ibuprofen as needed for your headache.  Do not take more than prescribed.  Check your MyChart online for the results of any tests that had not resulted by the time you left the emergency department.   Follow-up with your primary doctor in 2-3 days regarding your visit.  Follow-up with neurology as soon as possible to discuss your symptoms as well.  Return immediately to the emergency department if you experience any of the following: Vision changes, weakness of your arms or legs, or any other concerning symptoms.    Thank you for visiting our Emergency Department. It was a pleasure taking care of you today.

## 2022-09-21 NOTE — ED Triage Notes (Signed)
Mild headache, BLLE body aches, BL hand tingling, elevated BP x 1 week. Dizziness started yesterday morning. Seen at pcp and sent here for head CT.

## 2022-09-21 NOTE — Telephone Encounter (Signed)
Advised patient's wife that Dr. Janee Morn wanted patient to be seen in the ED due to his severe symptoms for imaging and treatment. Spoke with ED staff and they verbalized understanding.

## 2022-09-21 NOTE — ED Provider Notes (Signed)
[facility] EMERGENCY DEPARTMENT Provider Note   CSN: [id number] Arrival date & time: 09/21/22  1647     History {Add pertinent medical, surgical, social history, OB history to HPI:1} Chief Complaint  Patient presents with   Headache   Dizziness    Joe Greene is a 71 y.o. male.  71 year old male with a history of tobacco use who presents emergency department with headache.  3 weeks ago had URI that resolved but afterwards started experiencing a headache in his bilateral temples.  Says that it is also in his occiput.  Says it is very mild and saw his primary care doctor today after he started having dizziness with standing and walking around who referred him to the emergency department for additional evaluation.  Has also been having numbness and tingling of his hands and feet which is new for him.  No new medications.  No new exposures to chemicals.  No vision changes, no jaw claudication, no proximal muscle weakness or soreness.  Believes the headache was gradual in onset.  No fevers recently.       Home Medications Prior to Admission medications   Medication Sig Start Date End Date Taking? Authorizing Provider  hydrOXYzine (VISTARIL) 25 MG capsule Take 1 capsule (25 mg total) by mouth every 8 (eight) hours as needed. Patient not taking: Reported on 09/21/2022 03/16/22   Loyola Mast, MD  ibuprofen (ADVIL) 200 MG tablet Take 400 mg by mouth every 6 (six) hours as needed for headache or mild pain.    [provider]  sildenafil (VIAGRA) 100 MG tablet Take 0.5-1 tablets (50-100 mg total) by mouth daily as needed for erectile dysfunction. 10/25/21   Loyola Mast, MD  UNABLE TO FIND Med Name: IRON OTC    [provider]      Allergies    Patient has no known allergies.    Review of Systems   Review of Systems  Physical Exam Updated Vital Signs BP (!) 162/84   Pulse 67   Temp 98.2 F (36.8 C) (Oral)   Resp 16   Ht 5\' 10"  (1.778 m)   Wt 99.3  kg   SpO2 95%   BMI 31.42 kg/m  Physical Exam Vitals and nursing note reviewed.  Constitutional:      General: He is not in acute distress.    Appearance: He is well-developed.  HENT:     Head: Normocephalic and atraumatic.     Right Ear: External ear normal.     Left Ear: External ear normal.     Nose: Nose normal.  Eyes:     Extraocular Movements: Extraocular movements intact.     Conjunctiva/sclera: Conjunctivae normal.     Pupils: Pupils are equal, round, and reactive to light.  Cardiovascular:     Rate and Rhythm: Normal rate and regular rhythm.     Heart sounds: Normal heart sounds.  Pulmonary:     Effort: Pulmonary effort is normal. No respiratory distress.     Breath sounds: Normal breath sounds.  Musculoskeletal:        General: No swelling.     Cervical back: Normal range of motion and neck supple.     Right lower leg: No edema.     Left lower leg: No edema.  Skin:    General: Skin is warm and dry.     Capillary Refill: Capillary refill takes less than 2 seconds.  Neurological:     Mental Status: He is alert and  oriented to person, place, and time. Mental status is at baseline.     Comments: MENTAL STATUS: AAOx3 CRANIAL NERVES: II: Pupils equal and reactive 3 mm BL, no RAPD, no VF deficits III, IV, VI: EOM intact, no gaze preference or deviation, no nystagmus. V: normal sensation to light touch in V1, V2, and V3 segments bilaterally VII: no facial weakness or asymmetry, no nasolabial fold flattening VIII: normal hearing to speech and finger friction IX, X: normal palatal elevation, no uvular deviation XI: 5/5 head turn and 5/5 shoulder shrug bilaterally XII: midline tongue protrusion MOTOR: 5/5 strength in R shoulder flexion, elbow flexion and extension, and grip strength. 5/5 strength in L shoulder flexion, elbow flexion and extension, and grip strength.  5/5 strength in R hip and knee flexion, knee extension, ankle plantar and dorsiflexion. 5/5 strength in L  hip and knee flexion, knee extension, ankle plantar and dorsiflexion. SENSORY: Normal sensation to light touch in all extremities COORD: Normal finger to nose and heel to shin, no tremor, no dysmetria STATION: normal stance, no truncal ataxia GAIT: Normal   Psychiatric:        Mood and Affect: Mood normal.        Behavior: Behavior normal.     ED Results / Procedures / Treatments   Labs (all labs ordered are listed, but only abnormal results are displayed) Labs Reviewed  BASIC METABOLIC PANEL - Abnormal; Notable for the following components:      Result Value   Glucose, Bld 131 (*)    All other components within normal limits  URINALYSIS, ROUTINE W REFLEX MICROSCOPIC - Abnormal; Notable for the following components:   Protein, ur 30 (*)    All other components within normal limits  URINALYSIS, MICROSCOPIC (REFLEX) - Abnormal; Notable for the following components:   Bacteria, UA RARE (*)    All other components within normal limits  RESP PANEL BY RT-PCR (FLU A&B, COVID) [id number]  CBC    EKG None  Radiology CT HEAD WO CONTRAST  Result Date: 09/21/2022 CLINICAL DATA:  Headache EXAM: CT HEAD WITHOUT CONTRAST TECHNIQUE: Contiguous axial images were obtained from the base of the skull through the vertex without intravenous contrast. RADIATION DOSE REDUCTION: This exam was performed according to the departmental dose-optimization program which includes automated exposure control, adjustment of the mA and/or kV according to patient size and/or use of iterative reconstruction technique. COMPARISON:  None Available. FINDINGS: Brain: No acute territorial infarction, hemorrhage or intracranial mass. Mild atrophy. Minimal chronic small vessel ischemic changes the white matter. Mildly prominent ventricles felt secondary to atrophy. Vascular: No hyperdense vessels. Vertebral and carotid vascular calcification Skull: Normal. Negative for fracture or focal lesion. Sinuses/Orbits: Mild mucosal  thickening in the ethmoid sinuses Other: None IMPRESSION: 1. No CT evidence for acute intracranial abnormality. 2. Mild atrophy and minimal chronic small vessel ischemic changes of white matter. Electronically Signed   By: Jasmine Pang M.D.   On: 09/21/2022 17:26    Procedures Procedures   Medications Ordered in ED Medications - No data to display  ED Course/ Medical Decision Making/ A&P                           Medical Decision Making Amount and/or Complexity of Data Reviewed Labs: ordered. Radiology: ordered.   Gradyn Shein is a 72 y.o. male with comorbidities that complicate the patient evaluation including *** who presents with chief complaint of ***.  This patient presents to the  ED for concern of complaints listed in HPI, this involves an extensive number of treatment options, and is a complaint that carries with it a high risk of complications and morbidity. Disposition including potential need for admission considered.   Initial Ddx:  ***   MDM:  ***  Plan:  ***  ED Summary/Re-evaluation:  ***  Dispo: {Disposition:28069}   Additional history obtained from {Additional History:28067} Records reviewed {Records Reviewed:28068} The following labs were independently interpreted: {labs interpreted:28064} and show {lab findings:28250} I independently reviewed the following imaging with scope of interpretation limited to determining acute life threatening conditions related to emergency care: {imaging interpreted:28065}, which revealed {No acute abnormality:28066}  I personally reviewed and interpreted cardiac monitoring: {cardiac monitoring:28251} I personally reviewed and interpreted the pt's EKG: see above for interpretation  I have reviewed the patients home medications and made adjustments as needed Consults: {Consultants:28063} Social Determinants of health:  ***  {Document critical care time when appropriate:1} {Document POCUS if Performed:1}   {Document  critical care time when appropriate:1} {Document review of labs and clinical decision tools ie heart score, Chads2Vasc2 etc:1}  {Document your independent review of radiology images, and any outside records:1} {Document your discussion with family members, caretakers, and with consultants:1} {Document social determinants of health affecting pt's care:1} {Document your decision making why or why not admission, treatments were needed:1} Final Clinical Impression(s) / ED Diagnoses Final diagnoses:  None    Rx / DC Orders ED Discharge Orders     None

## 2022-09-22 ENCOUNTER — Encounter: Payer: Self-pay | Admitting: Family Medicine

## 2022-09-24 ENCOUNTER — Emergency Department (HOSPITAL_BASED_OUTPATIENT_CLINIC_OR_DEPARTMENT_OTHER): Payer: Medicare PPO

## 2022-09-24 ENCOUNTER — Ambulatory Visit (INDEPENDENT_AMBULATORY_CARE_PROVIDER_SITE_OTHER): Payer: Medicare PPO | Admitting: Family Medicine

## 2022-09-24 ENCOUNTER — Ambulatory Visit (INDEPENDENT_AMBULATORY_CARE_PROVIDER_SITE_OTHER): Payer: Medicare PPO

## 2022-09-24 ENCOUNTER — Encounter (HOSPITAL_BASED_OUTPATIENT_CLINIC_OR_DEPARTMENT_OTHER): Payer: Self-pay | Admitting: Pediatrics

## 2022-09-24 ENCOUNTER — Inpatient Hospital Stay (HOSPITAL_BASED_OUTPATIENT_CLINIC_OR_DEPARTMENT_OTHER)
Admission: EM | Admit: 2022-09-24 | Discharge: 2022-10-01 | DRG: 096 | Disposition: A | Discharge status: Home Health | Payer: Medicare PPO | Attending: Internal Medicine | Admitting: Internal Medicine

## 2022-09-24 ENCOUNTER — Other Ambulatory Visit: Payer: Self-pay

## 2022-09-24 ENCOUNTER — Telehealth: Payer: Self-pay

## 2022-09-24 ENCOUNTER — Encounter (HOSPITAL_COMMUNITY): Payer: Self-pay

## 2022-09-24 ENCOUNTER — Encounter: Payer: Self-pay | Admitting: Family Medicine

## 2022-09-24 VITALS — BP 156/92 | HR 78 | Temp 97.6°F

## 2022-09-24 DIAGNOSIS — R42 Dizziness and giddiness: Secondary | ICD-10-CM

## 2022-09-24 DIAGNOSIS — I714 Abdominal aortic aneurysm, without rupture, unspecified: Secondary | ICD-10-CM | POA: Diagnosis present

## 2022-09-24 DIAGNOSIS — I713 Abdominal aortic aneurysm, ruptured, unspecified: Secondary | ICD-10-CM

## 2022-09-24 DIAGNOSIS — Z823 Family history of stroke: Secondary | ICD-10-CM | POA: Diagnosis not present

## 2022-09-24 DIAGNOSIS — R519 Headache, unspecified: Secondary | ICD-10-CM | POA: Diagnosis not present

## 2022-09-24 DIAGNOSIS — R202 Paresthesia of skin: Secondary | ICD-10-CM

## 2022-09-24 DIAGNOSIS — R030 Elevated blood-pressure reading, without diagnosis of hypertension: Secondary | ICD-10-CM | POA: Diagnosis present

## 2022-09-24 DIAGNOSIS — R29818 Other symptoms and signs involving the nervous system: Secondary | ICD-10-CM | POA: Diagnosis not present

## 2022-09-24 DIAGNOSIS — I7143 Infrarenal abdominal aortic aneurysm, without rupture: Secondary | ICD-10-CM | POA: Diagnosis present

## 2022-09-24 DIAGNOSIS — R432 Parageusia: Secondary | ICD-10-CM | POA: Diagnosis not present

## 2022-09-24 DIAGNOSIS — I10 Essential (primary) hypertension: Secondary | ICD-10-CM | POA: Diagnosis present

## 2022-09-24 DIAGNOSIS — M47812 Spondylosis without myelopathy or radiculopathy, cervical region: Secondary | ICD-10-CM | POA: Diagnosis not present

## 2022-09-24 DIAGNOSIS — M62838 Other muscle spasm: Secondary | ICD-10-CM | POA: Diagnosis present

## 2022-09-24 DIAGNOSIS — K5900 Constipation, unspecified: Secondary | ICD-10-CM | POA: Diagnosis not present

## 2022-09-24 DIAGNOSIS — F17210 Nicotine dependence, cigarettes, uncomplicated: Secondary | ICD-10-CM | POA: Diagnosis present

## 2022-09-24 DIAGNOSIS — R7303 Prediabetes: Secondary | ICD-10-CM | POA: Diagnosis present

## 2022-09-24 DIAGNOSIS — M79609 Pain in unspecified limb: Secondary | ICD-10-CM

## 2022-09-24 DIAGNOSIS — R0602 Shortness of breath: Secondary | ICD-10-CM

## 2022-09-24 DIAGNOSIS — F17200 Nicotine dependence, unspecified, uncomplicated: Secondary | ICD-10-CM | POA: Diagnosis present

## 2022-09-24 DIAGNOSIS — R2990 Unspecified symptoms and signs involving the nervous system: Secondary | ICD-10-CM | POA: Diagnosis present

## 2022-09-24 DIAGNOSIS — R200 Anesthesia of skin: Secondary | ICD-10-CM | POA: Diagnosis not present

## 2022-09-24 DIAGNOSIS — R29898 Other symptoms and signs involving the musculoskeletal system: Secondary | ICD-10-CM

## 2022-09-24 DIAGNOSIS — M542 Cervicalgia: Secondary | ICD-10-CM | POA: Diagnosis not present

## 2022-09-24 DIAGNOSIS — Z8042 Family history of malignant neoplasm of prostate: Secondary | ICD-10-CM

## 2022-09-24 DIAGNOSIS — K5730 Diverticulosis of large intestine without perforation or abscess without bleeding: Secondary | ICD-10-CM | POA: Diagnosis not present

## 2022-09-24 DIAGNOSIS — R531 Weakness: Secondary | ICD-10-CM | POA: Diagnosis not present

## 2022-09-24 DIAGNOSIS — G610 Guillain-Barre syndrome: Secondary | ICD-10-CM | POA: Diagnosis present

## 2022-09-24 DIAGNOSIS — M545 Low back pain, unspecified: Secondary | ICD-10-CM | POA: Diagnosis not present

## 2022-09-24 DIAGNOSIS — G319 Degenerative disease of nervous system, unspecified: Secondary | ICD-10-CM | POA: Diagnosis not present

## 2022-09-24 DIAGNOSIS — F17201 Nicotine dependence, unspecified, in remission: Secondary | ICD-10-CM | POA: Diagnosis present

## 2022-09-24 DIAGNOSIS — Z79899 Other long term (current) drug therapy: Secondary | ICD-10-CM

## 2022-09-24 DIAGNOSIS — M47816 Spondylosis without myelopathy or radiculopathy, lumbar region: Secondary | ICD-10-CM | POA: Diagnosis not present

## 2022-09-24 DIAGNOSIS — E781 Pure hyperglyceridemia: Secondary | ICD-10-CM | POA: Diagnosis present

## 2022-09-24 DIAGNOSIS — Z720 Tobacco use: Secondary | ICD-10-CM | POA: Diagnosis present

## 2022-09-24 LAB — CBC WITH DIFFERENTIAL/PLATELET
Abs Immature Granulocytes: 0.03 10*3/uL (ref 0.00–0.07)
Basophils Absolute: 0.1 10*3/uL (ref 0.0–0.1)
Basophils Relative: 1 %
Eosinophils Absolute: 0.4 10*3/uL (ref 0.0–0.5)
Eosinophils Relative: 5 %
HCT: 49.3 % (ref 39.0–52.0)
Hemoglobin: 17.1 g/dL — ABNORMAL HIGH (ref 13.0–17.0)
Immature Granulocytes: 0 %
Lymphocytes Relative: 28 %
Lymphs Abs: 2.1 10*3/uL (ref 0.7–4.0)
MCH: 33.5 pg (ref 26.0–34.0)
MCHC: 34.7 g/dL (ref 30.0–36.0)
MCV: 96.5 fL (ref 80.0–100.0)
Monocytes Absolute: 0.7 10*3/uL (ref 0.1–1.0)
Monocytes Relative: 9 %
Neutro Abs: 4.2 10*3/uL (ref 1.7–7.7)
Neutrophils Relative %: 57 %
Platelets: 222 10*3/uL (ref 150–400)
RBC: 5.11 MIL/uL (ref 4.22–5.81)
RDW: 12.9 % (ref 11.5–15.5)
WBC: 7.5 10*3/uL (ref 4.0–10.5)
nRBC: 0 % (ref 0.0–0.2)

## 2022-09-24 LAB — COMPREHENSIVE METABOLIC PANEL
ALT: 45 U/L — ABNORMAL HIGH (ref 0–44)
AST: 28 U/L (ref 15–41)
Albumin: 4 g/dL (ref 3.5–5.0)
Alkaline Phosphatase: 50 U/L (ref 38–126)
Anion gap: 10 (ref 5–15)
BUN: 22 mg/dL (ref 8–23)
CO2: 23 mmol/L (ref 22–32)
Calcium: 8.8 mg/dL — ABNORMAL LOW (ref 8.9–10.3)
Chloride: 104 mmol/L (ref 98–111)
Creatinine, Ser: 0.98 mg/dL (ref 0.61–1.24)
GFR, Estimated: >60 mL/min (ref >60)
Glucose, Bld: 172 mg/dL — ABNORMAL HIGH (ref 70–99)
Potassium: 4 mmol/L (ref 3.5–5.1)
Sodium: 137 mmol/L (ref 135–145)
Total Bilirubin: 0.6 mg/dL (ref 0.3–1.2)
Total Protein: 7.6 g/dL (ref 6.5–8.1)

## 2022-09-24 LAB — B12 AND FOLATE PANEL
Folate: 17.2 ng/mL (ref >5.9)
Vitamin B-12: 196 pg/mL — ABNORMAL LOW (ref 211–911)

## 2022-09-24 LAB — URINALYSIS, ROUTINE W REFLEX MICROSCOPIC
Bilirubin Urine: NEGATIVE
Glucose, UA: 250 mg/dL — AB
Hgb urine dipstick: NEGATIVE
Ketones, ur: NEGATIVE mg/dL
Leukocytes,Ua: NEGATIVE
Nitrite: NEGATIVE
Protein, ur: NEGATIVE mg/dL
Specific Gravity, Urine: 1.025 (ref 1.005–1.030)
pH: 6 (ref 5.0–8.0)

## 2022-09-24 LAB — THYROID PANEL WITH TSH
Free Thyroxine Index: 1.8 (ref 1.4–3.8)
T3 Uptake: 27 % (ref 22–35)
T4, Total: 6.7 ug/dL (ref 4.9–10.5)
TSH: 6.47 mIU/L — ABNORMAL HIGH (ref 0.40–4.50)

## 2022-09-24 LAB — RAPID URINE DRUG SCREEN, HOSP PERFORMED
Amphetamines: NOT DETECTED
Barbiturates: NOT DETECTED
Benzodiazepines: NOT DETECTED
Cocaine: NOT DETECTED
Opiates: NOT DETECTED
Tetrahydrocannabinol: NOT DETECTED

## 2022-09-24 LAB — HEMOGLOBIN A1C: Hgb A1c MFr Bld: 6.5 % (ref 4.6–6.5)

## 2022-09-24 LAB — C-REACTIVE PROTEIN: CRP: <1 mg/dL (ref 0.5–20.0)

## 2022-09-24 LAB — TROPONIN I (HIGH SENSITIVITY): Troponin I (High Sensitivity): 3 ng/L (ref <18)

## 2022-09-24 LAB — BRAIN NATRIURETIC PEPTIDE: Pro B Natriuretic peptide (BNP): 12 pg/mL (ref 0.0–100.0)

## 2022-09-24 LAB — LIPASE, BLOOD: Lipase: 41 U/L (ref 11–51)

## 2022-09-24 LAB — SEDIMENTATION RATE: Sed Rate: 24 mm/hr — ABNORMAL HIGH (ref 0–20)

## 2022-09-24 LAB — MAGNESIUM: Magnesium: 2.1 mg/dL (ref 1.7–2.4)

## 2022-09-24 MED ORDER — DIPHENHYDRAMINE HCL 50 MG/ML IJ SOLN
12.5000 mg | Freq: Once | INTRAMUSCULAR | Status: AC
  Administered 2022-09-24: 12.5 mg via INTRAVENOUS
  Filled 2022-09-24: qty 1

## 2022-09-24 MED ORDER — ACETAMINOPHEN 650 MG RE SUPP: 650.0000 mg | RECTAL | Status: DC | PRN

## 2022-09-24 MED ORDER — SENNOSIDES-DOCUSATE SODIUM 8.6-50 MG PO TABS
1.0000 | ORAL_TABLET | Freq: Every evening | ORAL | Status: DC | PRN
  Administered 2022-09-25 – 2022-09-29 (×4): 1 via ORAL
  Filled 2022-09-24 (×5): qty 1

## 2022-09-24 MED ORDER — LORAZEPAM 2 MG/ML IJ SOLN
1.0000 mg | Freq: Once | INTRAMUSCULAR | Status: AC | PRN
  Administered 2022-09-25: 1 mg via INTRAVENOUS
  Filled 2022-09-24: qty 1

## 2022-09-24 MED ORDER — PREDNISONE 50 MG PO TABS: ORAL_TABLET | ORAL | 0 refills | Status: DC

## 2022-09-24 MED ORDER — PROCHLORPERAZINE EDISYLATE 10 MG/2ML IJ SOLN
5.0000 mg | Freq: Once | INTRAMUSCULAR | Status: AC
  Administered 2022-09-24: 5 mg via INTRAVENOUS
  Filled 2022-09-24: qty 2

## 2022-09-24 MED ORDER — ASPIRIN 300 MG RE SUPP
300.0000 mg | Freq: Every day | RECTAL | Status: DC
  Filled 2022-09-24: qty 1

## 2022-09-24 MED ORDER — SODIUM CHLORIDE 0.9 % IV BOLUS
1000.0000 mL | Freq: Once | INTRAVENOUS | Status: AC
  Administered 2022-09-24: 1000 mL via INTRAVENOUS

## 2022-09-24 MED ORDER — IOHEXOL 350 MG/ML SOLN
100.0000 mL | Freq: Once | INTRAVENOUS | Status: AC | PRN
  Administered 2022-09-24: 100 mL via INTRAVENOUS

## 2022-09-24 MED ORDER — ENOXAPARIN SODIUM 40 MG/0.4ML IJ SOSY
40.0000 mg | PREFILLED_SYRINGE | INTRAMUSCULAR | Status: DC
  Administered 2022-09-24 – 2022-09-30 (×7): 40 mg via SUBCUTANEOUS
  Filled 2022-09-24 (×7): qty 0.4

## 2022-09-24 MED ORDER — ACETAMINOPHEN 325 MG PO TABS
650.0000 mg | ORAL_TABLET | ORAL | Status: DC | PRN
  Administered 2022-09-25 – 2022-10-01 (×13): 650 mg via ORAL
  Filled 2022-09-24 (×13): qty 2

## 2022-09-24 MED ORDER — STROKE: EARLY STAGES OF RECOVERY BOOK
Freq: Once | Status: DC
  Filled 2022-09-24: qty 1

## 2022-09-24 MED ORDER — ONDANSETRON HCL 4 MG/2ML IJ SOLN
4.0000 mg | Freq: Once | INTRAMUSCULAR | Status: AC
  Administered 2022-09-24: 4 mg via INTRAVENOUS
  Filled 2022-09-24: qty 2

## 2022-09-24 MED ORDER — SODIUM CHLORIDE 0.9 % IV BOLUS: 1000.0000 mL | Freq: Once | INTRAVENOUS | Status: DC

## 2022-09-24 MED ORDER — ONDANSETRON HCL 4 MG/2ML IJ SOLN: 4.0000 mg | Freq: Four times a day (QID) | INTRAMUSCULAR | Status: DC | PRN

## 2022-09-24 MED ORDER — ACETAMINOPHEN 160 MG/5ML PO SOLN: 650.0000 mg | ORAL | Status: DC | PRN

## 2022-09-24 MED ORDER — NICOTINE 21 MG/24HR TD PT24
21.0000 mg | MEDICATED_PATCH | Freq: Every day | TRANSDERMAL | Status: DC
  Administered 2022-09-24 – 2022-10-01 (×8): 21 mg via TRANSDERMAL
  Filled 2022-09-24 (×8): qty 1

## 2022-09-24 MED ORDER — SODIUM CHLORIDE 0.9 % IV SOLN: INTRAVENOUS | Status: DC

## 2022-09-24 MED ORDER — ALBUTEROL SULFATE HFA 108 (90 BASE) MCG/ACT IN AERS: 2.0000 | INHALATION_SPRAY | Freq: Four times a day (QID) | RESPIRATORY_TRACT | 0 refills | Status: DC | PRN

## 2022-09-24 MED ORDER — ASPIRIN 325 MG PO TABS
325.0000 mg | ORAL_TABLET | Freq: Every day | ORAL | Status: DC
  Administered 2022-09-24 – 2022-10-01 (×8): 325 mg via ORAL
  Filled 2022-09-24 (×8): qty 1

## 2022-09-24 MED ORDER — MORPHINE SULFATE (PF) 4 MG/ML IV SOLN
4.0000 mg | Freq: Once | INTRAVENOUS | Status: AC
  Administered 2022-09-24: 4 mg via INTRAVENOUS
  Filled 2022-09-24: qty 1

## 2022-09-24 NOTE — ED Notes (Signed)
BP on left arm

## 2022-09-24 NOTE — Telephone Encounter (Signed)
Patient seen in office today with Dr. Janee Morn and discussed concerns

## 2022-09-24 NOTE — Assessment & Plan Note (Signed)
-  Encourage cessation.   -Heavy use! -This was discussed with the patient and should be reviewed on an ongoing basis.   -Patch ordered

## 2022-09-24 NOTE — Consult Note (Signed)
Initial Consultation Note   Patient: Joe Greene Joe Greene DOB: 07/23/1951 PCP: Joe Mast, MD DOA: 09/24/2022 DOS: the patient was seen and examined on 09/24/2022 Primary service: Joe Blue, MD  Referring physician: Particia Greene - ER Reason for consult: Here on 11/10 with dizziness and headache.  Still very dizzy, difficulty walking.  Concern for CVA.  Also with RLE weakness.  Had back pain, large aneurysm on [place].  CT C/A/P reassuring, aneurysm can be followed as an outpatient.  D/w neurology, needs MRI but not acutely.      Assessment and Plan: * Dizziness -Patient with elevated BP, dizziness, and neurologic symptoms -Concerning for CVA -Negative head CT on 11/10 -Aspirin has been given to reduce stroke mortality and decrease morbidity -Will place in observation status for TIA evaluation -Telemetry monitoring -MRI/MRA -Echo -Risk stratification with FLP; will also check UDS -Neurology consult if MRI is positive -PT/OT/Nutrition Consults  Elevated blood-pressure reading without diagnosis of hypertension -No diagnosis of HTN -Will follow -He may benefit from treatment of HTN  AAA (abdominal aortic aneurysm) (HCC) -CTA ordered due to concern raised prior -No apparent dissection, appears smaller than expected -Likely ok for outpatient vascular f/u  Tobacco use -Encourage cessation.   -Heavy use! -This was discussed with the patient and should be reviewed on an ongoing basis.   -Patch ordered    FULL CODE - confirmed with patient at the time of the visit.   HPI: Joe Greene is a 71 y.o. male without significant past medical history presenting with RLE weakness.  He was seen in the ER on 11/10 for an intractable headache; head CT was negative and he improved and so was discharged.  3 weeks ago he started with an intractable heache.  He started with numbness in his fingers and dizzy on Thursday.  Friday it was worse with tingling in his fingers and feet and his BP was  very high.  Periodic SOB.  They saw his PCP and was sent to the ER.  He had a negative CT and he went home.  Saturday was no better and Sunday was bad.  He went back to the PCP and was sent to the ER.  Concern for an aneurysm.  He is still having numb B fingers and feet (R > L), dizziness.  Review of Systems: As mentioned in the history of present illness. All other systems reviewed and are negative. Past Medical History:  Diagnosis Date   Cataract    forming    Past Surgical History:  Procedure Laterality Date   FOOT SURGERY Right    HERNIA REPAIR Right    KNEE ARTHROSCOPY Left    WISDOM TOOTH EXTRACTION     Social History:  reports that he has been smoking cigarettes. He has a 100.00 pack-year smoking history. He has never used smokeless tobacco. He reports current alcohol use of about 5.0 standard drinks of alcohol per week. He reports that he does not use drugs.  No Known Allergies  Family History  Problem Relation Age of Onset   Stroke Mother 62   AAA (abdominal aortic aneurysm) Mother    Cancer Father        Prostate   Colon cancer Neg Hx    Colon polyps Neg Hx    Esophageal cancer Neg Hx    Rectal cancer Neg Hx    Stomach cancer Neg Hx     Prior to Admission medications   Medication Sig Start Date End Date Taking? Authorizing Provider  albuterol (  VENTOLIN HFA) 108 (90 Base) MCG/ACT inhaler Inhale 2 puffs into the lungs every 6 (six) hours as needed for wheezing or shortness of breath. 09/24/22   Garnette Gunner, MD  hydrOXYzine (VISTARIL) 25 MG capsule Take 1 capsule (25 mg total) by mouth every 8 (eight) hours as needed. Patient not taking: Reported on 09/21/2022 03/16/22   Joe Mast, MD  ibuprofen (ADVIL) 200 MG tablet Take 400 mg by mouth every 6 (six) hours as needed for headache or mild pain.    [provider]  predniSONE (DELTASONE) 50 MG tablet Take 1 tablet daily for 5 days. 09/24/22   Garnette Gunner, MD  sildenafil (VIAGRA) 100 MG tablet Take  0.5-1 tablets (50-100 mg total) by mouth daily as needed for erectile dysfunction. 10/25/21   Joe Mast, MD  UNABLE TO FIND Med Name: IRON OTC    [provider]    Physical Exam: Vitals:   09/24/22 1445 09/24/22 1530 09/24/22 1600 09/24/22 1607  BP: (!) 154/92 (!) 158/84 (!) 165/81   Pulse: 81 71 63   Resp: (!) 23 15 (!) 22   Temp:    98.2 F (36.8 C)  TempSrc:    Oral  SpO2: 99% 96% 91%   Weight:      Height:       General:  Appears calm and comfortable and is in NAD Eyes:  PERRL, EOMI, normal lids, iris ENT:  grossly normal hearing, lips & tongue, mmm; appropriate dentition Neck:  no LAD, masses or thyromegaly Cardiovascular:  RRR, no m/r/g. No LE edema.  Respiratory:   CTA bilaterally with no wheezes/rales/rhonchi.  Normal respiratory effort. Abdomen:  soft, NT, ND Skin:  no rash or induration seen on limited exam Musculoskeletal:  grossly normal tone BUE/BLE, good ROM, no bony abnormality Psychiatric:  grossly normal mood and affect, speech fluent and appropriate, AOx3 Neurologic:  CN 2-12 grossly intact, moves all extremities in coordinated fashion   Radiological Exams on Admission: Independently reviewed - see discussion in A/P where applicable  DG Cervical Spine Complete  Result Date: 09/24/2022 CLINICAL DATA:  Neck pain, upper arm numbness EXAM: CERVICAL SPINE - COMPLETE 4+ VIEW COMPARISON:  None Available. FINDINGS: No recent fracture is seen. Lower cervical spine is not optimally visualized in the lateral view related to patient's body habitus. In the AP and oblique views, no fracture is seen in lower cervical spine. Degenerative changes are noted with bony spurs and facet hypertrophy at multiple levels. There is encroachment neural foramina from C3-C7 levels, more so at C5-C6 level. IMPRESSION: No recent fracture is seen. Cervical spondylosis with encroachment of neural foramina from C3-C7 levels, more severe at C5-C6 level. Electronically Signed   By:  Ernie Avena M.D.   On: 09/24/2022 10:06   DG Lumbar Spine Complete  Result Date: 09/24/2022 CLINICAL DATA:  Back pain, numbness right leg EXAM: LUMBAR SPINE - COMPLETE 4+ VIEW COMPARISON:  None Available. FINDINGS: No recent fracture is seen. Alignment of posterior margins of vertebral bodies appears normal. Degenerative changes are noted with anterior bony spurs and facet hypertrophy. There is no significant disc space narrowing. In the lateral view, there is evidence of aneurysmal dilation of abdominal aorta measuring 6.1 cm in AP diameter. IMPRESSION: No recent fracture is seen in the lumbar spine. Lumbar spondylosis with anterior bony spurs and facet hypertrophy. There is aneurysmal dilation of abdominal aorta measuring 6.1 cm in AP diameter. Follow-up contrast enhanced CT should be considered for further evaluation. These  results will be called to the ordering clinician or representative by the Radiologist Assistant, and communication documented in the PACS or [employer]. Electronically Signed   By: Ernie Avena M.D.   On: 09/24/2022 10:04   DG Chest 2 View  Result Date: 09/24/2022 CLINICAL DATA:  Shortness of breath EXAM: CHEST - 2 VIEW COMPARISON:  CT chest done on 01/17/2022 FINDINGS: Cardiac size is within normal limits. Thoracic aorta is tortuous and ectatic. There is increase in interstitial markings in both parahilar regions and both lower lung fields. There is no focal pulmonary consolidation. There is no pleural effusion or pneumothorax. IMPRESSION: Increased interstitial markings are seen in parahilar regions and lower lung fields on both sides suggesting possible chronic interstitial lung disease with scarring. Possibility of interstitial pneumonia is not excluded. There is no focal consolidation. There is no pleural effusion or pneumothorax. Electronically Signed   By: Ernie Avena M.D.   On: 09/24/2022 10:01    EKG: Independently reviewed.  NSR with rate 76;  LAFB;  no evidence of acute ischemia   Labs on Admission: I have personally reviewed the available labs and imaging studies at the time of the admission.  Pertinent labs:    Glucose 172 AST 28/ALT 45 HS troponin 3 Unremarkable CBC UA: 250 glucose   Family Communication: Wife was present throughout televisit  Primary team communication: Patient was discussed with EDP at the time of the consult  Thank you very much for involving [place] in the care of your patient.  Author: Jonah Blue, MD 09/24/2022 5:34 PM  For on call review [email or web address].

## 2022-09-24 NOTE — ED Provider Notes (Signed)
[facility] EMERGENCY DEPARTMENT Provider Note   CSN: [id number] Arrival date & time: 09/24/22  1200     History  Chief Complaint  Patient presents with   Numbness    Joe Greene is a 71 y.o. male.  71 yo M with a chief complaints of feeling dizzy.  This has been going on for few days now.  He says he started feeling it on Thursday and had developed a headache and was seen in the ER on Friday.  Symptoms got much worse on Sunday and he went to see his family doctor this morning.  He had a x-ray performed and was called back and told to come immediately to the emergency department for evaluation.  He has had some shortness of breath on exertion this been ongoing.  Has also had some abdominal pain that seems to catch him at certain times that severe and feels like an abdominal cramp and he stretches that he gets better.  Has been going on for about 4 or 5 years.  He denies trauma.  He is currently getting over an upper respiratory illness.  Says been coughing congested for at least 3 weeks or so.  Has been prescribed Flonase which he had been taking.  Today when he saw his family doctor was going to be started on prednisone and albuterol inhaler which she has not yet filled.        Home Medications Prior to Admission medications   Medication Sig Start Date End Date Taking? Authorizing Provider  albuterol (VENTOLIN HFA) 108 (90 Base) MCG/ACT inhaler Inhale 2 puffs into the lungs every 6 (six) hours as needed for wheezing or shortness of breath. 09/24/22   Garnette Gunner, MD  hydrOXYzine (VISTARIL) 25 MG capsule Take 1 capsule (25 mg total) by mouth every 8 (eight) hours as needed. Patient not taking: Reported on 09/21/2022 03/16/22   Loyola Mast, MD  ibuprofen (ADVIL) 200 MG tablet Take 400 mg by mouth every 6 (six) hours as needed for headache or mild pain.    [provider]  predniSONE (DELTASONE) 50 MG tablet Take 1 tablet daily for 5 days. 09/24/22   Garnette Gunner, MD  sildenafil (VIAGRA) 100 MG tablet Take 0.5-1 tablets (50-100 mg total) by mouth daily as needed for erectile dysfunction. 10/25/21   Loyola Mast, MD  UNABLE TO FIND Med Name: IRON OTC    [provider]      Allergies    Patient has no known allergies.    Review of Systems   Review of Systems  Physical Exam Updated Vital Signs BP (!) 158/84   Pulse 71   Temp 98.5 F (36.9 C) (Axillary)   Resp 15   Ht 5\' 10"  (1.778 m)   Wt 96.2 kg   SpO2 96%   BMI 30.42 kg/m  Physical Exam Vitals and nursing note reviewed.  Constitutional:      Appearance: He is well-developed.  HENT:     Head: Normocephalic and atraumatic.  Eyes:     Pupils: Pupils are equal, round, and reactive to light.  Neck:     Vascular: No JVD.  Cardiovascular:     Rate and Rhythm: Normal rate and regular rhythm.     Heart sounds: No murmur heard.    No friction rub. No gallop.  Pulmonary:     Effort: No respiratory distress.     Breath sounds: No wheezing.  Abdominal:     General: There  is no distension.     Tenderness: There is no abdominal tenderness. There is no guarding or rebound.  Musculoskeletal:        General: Normal range of motion.     Cervical back: Normal range of motion and neck supple.  Skin:    Coloration: Skin is not pale.     Findings: No rash.  Neurological:     Mental Status: He is alert and oriented to person, place, and time.     GCS: GCS eye subscore is 4. GCS verbal subscore is 5. GCS motor subscore is 6.     Cranial Nerves: Cranial nerves 2-12 are intact.     Sensory: Sensation is intact.     Motor: Motor function is intact.     Coordination: Coordination is intact.     Gait: Gait abnormal.     Comments: Patient has trouble maintaining his balance tells me that he is very dizzy and has to hold onto things as he walks about the room.  Appears to have some difficulty with the right lower extremity.  Tells me its not painful.  Tells me he feels like it is  going to give out.  Perhaps 4/5 weakness to RLE which is overcome with increased effort.   Psychiatric:        Behavior: Behavior normal.     ED Results / Procedures / Treatments   Labs (all labs ordered are listed, but only abnormal results are displayed) Labs Reviewed  CBC WITH DIFFERENTIAL/PLATELET - Abnormal; Notable for the following components:      Result Value   Hemoglobin 17.1 (*)    All other components within normal limits  COMPREHENSIVE METABOLIC PANEL - Abnormal; Notable for the following components:   Glucose, Bld 172 (*)    Calcium 8.8 (*)    ALT 45 (*)    All other components within normal limits  URINALYSIS, ROUTINE W REFLEX MICROSCOPIC - Abnormal; Notable for the following components:   Glucose, UA 250 (*)    All other components within normal limits  LIPASE, BLOOD  MAGNESIUM  TROPONIN I (HIGH SENSITIVITY)    EKG None  Radiology DG Cervical Spine Complete  Result Date: 09/24/2022 CLINICAL DATA:  Neck pain, upper arm numbness EXAM: CERVICAL SPINE - COMPLETE 4+ VIEW COMPARISON:  None Available. FINDINGS: No recent fracture is seen. Lower cervical spine is not optimally visualized in the lateral view related to patient's body habitus. In the AP and oblique views, no fracture is seen in lower cervical spine. Degenerative changes are noted with bony spurs and facet hypertrophy at multiple levels. There is encroachment neural foramina from C3-C7 levels, more so at C5-C6 level. IMPRESSION: No recent fracture is seen. Cervical spondylosis with encroachment of neural foramina from C3-C7 levels, more severe at C5-C6 level. Electronically Signed   By: Ernie Avena M.D.   On: 09/24/2022 10:06   DG Lumbar Spine Complete  Result Date: 09/24/2022 CLINICAL DATA:  Back pain, numbness right leg EXAM: LUMBAR SPINE - COMPLETE 4+ VIEW COMPARISON:  None Available. FINDINGS: No recent fracture is seen. Alignment of posterior margins of vertebral bodies appears normal.  Degenerative changes are noted with anterior bony spurs and facet hypertrophy. There is no significant disc space narrowing. In the lateral view, there is evidence of aneurysmal dilation of abdominal aorta measuring 6.1 cm in AP diameter. IMPRESSION: No recent fracture is seen in the lumbar spine. Lumbar spondylosis with anterior bony spurs and facet hypertrophy. There is aneurysmal dilation of  abdominal aorta measuring 6.1 cm in AP diameter. Follow-up contrast enhanced CT should be considered for further evaluation. These results will be called to the ordering clinician or representative by the Radiologist Assistant, and communication documented in the PACS or [employer]. Electronically Signed   By: Ernie Avena M.D.   On: 09/24/2022 10:04   DG Chest 2 View  Result Date: 09/24/2022 CLINICAL DATA:  Shortness of breath EXAM: CHEST - 2 VIEW COMPARISON:  CT chest done on 01/17/2022 FINDINGS: Cardiac size is within normal limits. Thoracic aorta is tortuous and ectatic. There is increase in interstitial markings in both parahilar regions and both lower lung fields. There is no focal pulmonary consolidation. There is no pleural effusion or pneumothorax. IMPRESSION: Increased interstitial markings are seen in parahilar regions and lower lung fields on both sides suggesting possible chronic interstitial lung disease with scarring. Possibility of interstitial pneumonia is not excluded. There is no focal consolidation. There is no pleural effusion or pneumothorax. Electronically Signed   By: Ernie Avena M.D.   On: 09/24/2022 10:01    Procedures Procedures    Medications Ordered in ED Medications  morphine (PF) 4 MG/ML injection 4 mg (has no administration in time range)  ondansetron (ZOFRAN) injection 4 mg (has no administration in time range)  sodium chloride 0.9 % bolus 1,000 mL (1,000 mLs Intravenous New Bag/Given 09/24/22 1244)  prochlorperazine (COMPAZINE) injection 5 mg (5 mg  Intravenous Given 09/24/22 1247)  diphenhydrAMINE (BENADRYL) injection 12.5 mg (12.5 mg Intravenous Given 09/24/22 1248)  iohexol (OMNIPAQUE) 350 MG/ML injection 100 mL (100 mLs Intravenous Contrast Given 09/24/22 1330)    ED Course/ Medical Decision Making/ A&P                           Medical Decision Making Amount and/or Complexity of Data Reviewed Labs: ordered. Radiology: ordered.  Risk Prescription drug management.   71 yo M with a chief complaints of dizziness headache and feeling unsteady when he walks.  He describes diffuse tingling across his arms and legs that seems to come and go.  Has been going on for about 4 to 5 days now.  Has had a bit of a headache with this as well.  He was seen in the emergency department at the onset of his symptoms with an unremarkable work-up.  Has been battling with an upper respiratory illness for the past month and saw his family doctor again this morning who ordered a very thorough work-up including a plain film of the L-spine which the radiologist read is concerning for a 6.1 cm AAA which she was then called back and told to come to the emergency department for evaluation.  He initially had told me he did not have any abdominal pain and then tells me that he has had some off and on for about 4 or 5 years.  Seems to grab him like a cramp when he bends over and stretches and it tends to get better.  He thinks this is maybe a little bit worse over the past week or so.  We will obtain a CT angiogram of the chest abdomen pelvis.  Blood work.  Bolus of IV fluids.  Reassess.  The patients results and plan were reviewed and discussed.   Any x-rays performed were independently reviewed by myself.   Differential diagnosis were considered with the presenting HPI.  Medications  morphine (PF) 4 MG/ML injection 4 mg (has no administration in time  range)  ondansetron (ZOFRAN) injection 4 mg (has no administration in time range)  sodium chloride 0.9 % bolus  1,000 mL (1,000 mLs Intravenous New Bag/Given 09/24/22 1244)  prochlorperazine (COMPAZINE) injection 5 mg (5 mg Intravenous Given 09/24/22 1247)  diphenhydrAMINE (BENADRYL) injection 12.5 mg (12.5 mg Intravenous Given 09/24/22 1248)  iohexol (OMNIPAQUE) 350 MG/ML injection 100 mL (100 mLs Intravenous Contrast Given 09/24/22 1330)    Vitals:   09/24/22 1415 09/24/22 1430 09/24/22 1445 09/24/22 1530  BP: 137/87 139/78 (!) 154/92 (!) 158/84  Pulse: 62 63 81 71  Resp: 20 17 (!) 23 15  Temp:      TempSrc:      SpO2: 98% 97% 99% 96%  Weight:      Height:        Final diagnoses:  Right leg weakness  Dizziness          Final Clinical Impression(s) / ED Diagnoses Final diagnoses:  Right leg weakness  Dizziness    Rx / DC Orders ED Discharge Orders     None         Melene PlanFloyd, Caasi Giglia, DO 09/24/22 1536

## 2022-09-24 NOTE — ED Notes (Signed)
Face time with dr Ophelia Charter

## 2022-09-24 NOTE — ED Notes (Signed)
Wife in room with pt 

## 2022-09-24 NOTE — Telephone Encounter (Signed)
Caller: [facility]   Receiver: Ameir Faria L.   Time: 10:27 am   Diane called to inform us of Mr.Allen's stat X-ray being resulted  Stating that DG showed Aneurysmal dilation of abdominal aorta. 6.1 cm   Provider and CMA aware.

## 2022-09-24 NOTE — ED Triage Notes (Addendum)
Reported was sent by PCP urgently due to concerns for aortic aneurysm, stated has had some numbness on bilateral hands and legs along with progressive weakness.

## 2022-09-24 NOTE — Assessment & Plan Note (Signed)
-  No diagnosis of HTN -Will follow -He may benefit from treatment of HTN

## 2022-09-24 NOTE — ED Notes (Signed)
BP on right arm

## 2022-09-24 NOTE — Progress Notes (Unsigned)
F/u visit, RT leg numbness No injury Current smoker Hx of emphysema

## 2022-09-24 NOTE — Progress Notes (Signed)
Assessment/Plan:   Problem List Items Addressed This Visit   None Visit Diagnoses     Dizziness    -  Primary   Relevant Orders   MR Brain W Wo Contrast   ECHOCARDIOGRAM COMPLETE   Thyroid Panel With TSH   Ambulatory referral to Neurology   Sedimentation rate   C-reactive protein   Shortness of breath       Relevant Medications   predniSONE (DELTASONE) 50 MG tablet   albuterol (VENTOLIN HFA) 108 (90 Base) MCG/ACT inhaler   Other Relevant Orders   DG Chest 2 View (Completed)   ECHOCARDIOGRAM COMPLETE   B Nat Peptide   Thyroid Panel With TSH   Sedimentation rate   C-reactive protein   Right leg weakness       Relevant Orders   DG Lumbar Spine Complete (Completed)   B12 and Folate Panel   Ambulatory referral to Neurology   Sedimentation rate   C-reactive protein   Paresthesia and pain of extremity       Relevant Orders   MR Brain W Wo Contrast   Thyroid Panel With TSH   B12 and Folate Panel   Hemoglobin A1C   Ambulatory referral to Neurology   DG Cervical Spine Complete (Completed)   Intractable headache, unspecified chronicity pattern, unspecified headache type       Relevant Orders   MR Brain W Wo Contrast   Dysgeusia       Relevant Orders   Ambulatory referral to Neurology   Brain atrophy [facility])       Relevant Orders   MR Brain W Wo Contrast   Abdominal aortic aneurysm (AAA) without rupture, unspecified part (HCC)       Relevant Orders   CT ANGIO ABDOMEN PELVIS  W &/OR WO CONTRAST   Ruptured abdominal aortic aneurysm (AAA), unspecified part (HCC)       Relevant Orders   CT ANGIO ABDOMEN PELVIS  W &/OR WO CONTRAST      Update.  To plan.  AAA found on lumbar spine.  Diameter 6.1 cm.  Given lower extremity symptoms, concern for ischemia or Leriche syndrome.  Discussed risk of rupture and high fatality rate.  Patient called and advised to go to emergency department.  Patient amenable to this and stated he would go.  Initial A&P Progressively worsening  illness in headache, shortness of breath, dizziness, paresthesias in upper and lower extremities, lower extremity pain and weakness, and dysgeusia.    Recent ED work-up was grossly normal including for COVID/flu, CT head, EKG, CBC, CMP, UA.   Significant concern for rapid onset neurodegenerative disease such as ALS or MS.  Described dangers of this and counseled patient patient encouraged to go to emergency department.  Patient hesitant to do this given recent work-up at ED was negative.   Patient would like to see neurology, preferences [facility].  Urgent referral to neurology placed.  For ongoing headache, given the negative head CT, MRI was ordered to assess for missed cranial injury.  Given I am going lower extremity pain and numbness, will get stat lumbar spine to grossly assess for disc disease.  Low threshold to get MRI.  For dyspnea, BNP, chest x-ray, echocardiogram ordered to rule out possible heart failure or other causes respiratory disease.  For ongoing paresthesia, thyroid, B12 and hemoglobin A1c were ordered to assess for causes for peripheral neuropathy.     Subjective:  HPI:  Joe Greene is a 71 y.o. male who has  Tobacco use; Internal hemorrhoids; Diverticula of colon; Eczema; Actinic keratoses; Emphysema of lung (HCC); Aortic atherosclerosis (HCC); Candidal intertrigo; Coronary artery calcification; Hypertriglyceridemia; and Iron deficiency on their problem list..   He  has a past medical history of Cataract.Marland Kitchen   He presents with chief complaint of Hospitalization Follow-up (SOB, feet and hands are numb, thigh pain, and lower back pain. ) .   Update 09/24/22  Patient presents for follow-up from 09/21/2022.  Patient reports worsening dizziness, headache, shortness of breath, bilateral upper numbness, lower back pain and right lower leg numbness and weakness.  Patient reports that his shortness of breath was not worse last night.  He did not sleep well.  Patient went to  the emergency department 09/21/2022.  Patient with CT head that was unremarkable for acute etiology, did show mild atrophy and small vessel disease.  Patient lab work grossly unremarkable including BMP, CBC, urinalysis, COVID and flu.  EKG was also unremarkable for acute STEMI or other urgent cardiac issues.  Patient states that he is tolerating food well, but fluids taste very abnormal.  States that he is only able to drink hot chocolate.  Denies any bowel or bladder ischemia. He denies any chest pain.   09/21/22 Patient presents with 1 week of worsening cough, sinus pressure on the right side, chills, and headache.  Over the last 24 hours patient has had significantly worsening headache, shortness of breath, and dizziness.  He finds it difficult to walk without assistance.   In the past 24 hours he states that he has also developed numbness in his fingers and that all of his limbs feel "heavy" and are very painful and weak.  He has also had shortness of breath, particularly last night.  Patient reports that he has had significantly elevated blood pressures at home in the 160s over 90s.  He denies any blurry vision, facial numbness, dysarthria, confusion, loss of conscious, seizure, chest pain.  Patient has been taking ibuprofen "like candy" without any improvement in headache.  Patient's wife is with him in the room and states "that he is not like himself ".  She is very concerned.  Patient tested and flu negative.  Past Surgical History:  Procedure Laterality Date   FOOT SURGERY Right    HERNIA REPAIR Right    KNEE ARTHROSCOPY Left    WISDOM TOOTH EXTRACTION      Outpatient Medications Prior to Visit  Medication Sig Dispense Refill   ibuprofen (ADVIL) 200 MG tablet Take 400 mg by mouth every 6 (six) hours as needed for headache or mild pain.     sildenafil (VIAGRA) 100 MG tablet Take 0.5-1 tablets (50-100 mg total) by mouth daily as needed for erectile dysfunction. 5 tablet 11   UNABLE TO FIND  Med Name: IRON OTC     hydrOXYzine (VISTARIL) 25 MG capsule Take 1 capsule (25 mg total) by mouth every 8 (eight) hours as needed. (Patient not taking: Reported on 09/21/2022) 30 capsule 2   No facility-administered medications prior to visit.    Family History  Problem Relation Age of Onset   Stroke Mother    Cancer Father        Prostate   Colon cancer Neg Hx    Colon polyps Neg Hx    Esophageal cancer Neg Hx    Rectal cancer Neg Hx    Stomach cancer Neg Hx     Social History   Socioeconomic History   Marital status: Married    Spouse name:  Not on file   Number of children: 2   Years of education: Not on file   Highest education level: Not on file  Occupational History   Occupation: Retired    Comment: Scientist, research (medical)- [employer]  Tobacco Use   Smoking status: Every Day    Packs/day: 2.00    Types: Cigarettes   Smokeless tobacco: Never  Vaping Use   Vaping Use: Never used  Substance and Sexual Activity   Alcohol use: Yes    Alcohol/week: 5.0 standard drinks of alcohol    Types: 5 Standard drinks or equivalent per week    Comment: occ   Drug use: Never   Sexual activity: Yes  Other Topics Concern   Not on file  Social History Narrative   Not on file   Social Determinants of Health   Greene Resource Strain: Low Risk  (11/14/2021)   Overall Greene Resource Strain (CARDIA)    Difficulty of Paying Living Expenses: Not hard at all  Food Insecurity: No Food Insecurity (11/14/2021)   Hunger Vital Sign    Worried About Running Out of Food in the Last Year: Never true    Ran Out of Food in the Last Year: Never true  Transportation Needs: No Transportation Needs (11/14/2021)   PRAPARE - Administrator, Civil Service (Medical): No    Lack of Transportation (Non-Medical): No  Physical Activity: Sufficiently Active (11/14/2021)   Exercise Vital Sign    Days of Exercise per Week: 6 days    Minutes of Exercise per Session: 60 min  Stress: No Stress  Concern Present (11/14/2021)   Joe Greene    Feeling of Stress : Not at all  Social Connections: Moderately Integrated (11/14/2021)   Social Connection and Isolation Panel [NHANES]    Frequency of Communication with Friends and Family: Twice a week    Frequency of Social Gatherings with Friends and Family: Twice a week    Attends Religious Services: More than 4 times per year    Active Member of Joe Greene or Organizations: No    Attends Banker Meetings: Never    Marital Status: Married  Catering manager Violence: Not At Risk (11/14/2021)   Humiliation, Afraid, Rape, and Kick Greene    Fear of Current or Ex-Partner: No    Emotionally Abused: No    Physically Abused: No    Sexually Abused: No                                                                                                 Objective:  Physical Exam: BP (!) 156/92 (BP Location: Left Arm, Patient Position: Sitting, Cuff Size: Large)   Pulse 78   Temp 97.6 F (36.4 C) (Temporal)   SpO2 95%    General: Appears ill, in wheelchair because he says he cannot walk, A and O x3 Eyes: Normal conjunctiva, anicteric.  PERRLA, EOMI, no nystagmus ENT: Hearing grossly intact. No nasal discharge.   Neck: Neck is supple. No masses or thyromegaly.  Respiratory: CTA B  Skin: Warm.  No rashes or ulcers.  Psych: Alert and oriented.  Normal judgment.  CV: RRR, no MRG ABD: Nontender, nondistended MSK: Has difficulty ambulating from chair to exam table a few feet away due to dizziness, 5 of 5 strength in upper and lower extremities Neuro: Sensation intact throughout face, and upper and lower extremities, no tremor, and CN II-XII grossly normal.        Garner NashAaron Bradley Zakariya Knickerbocker, MD, MS

## 2022-09-24 NOTE — Progress Notes (Unsigned)
F/u visit for upper arm numbness, dizziness, sob, Hx of emphysema and current smoker

## 2022-09-24 NOTE — Assessment & Plan Note (Signed)
-  CTA ordered due to concern raised prior -No apparent dissection, appears smaller than expected -Likely ok for outpatient vascular f/u

## 2022-09-24 NOTE — Patient Instructions (Addendum)
For headache dizziness and numbness, We are ordering MRI of brain and lumbar spine as well as blood. We are referring you urgently to [facility].   For shortness of breath, we are starting steroids and inhaler. We are ordering chest xray and heart ultrasound.   If there is a lag on these tests, you get worsening shortness of breath, chest pain, weakness, or any other worrisome symptoms, go to ED

## 2022-09-24 NOTE — Assessment & Plan Note (Signed)
-  Patient with elevated BP, dizziness, and neurologic symptoms -Concerning for CVA -Negative head CT on 11/10 -Aspirin has been given to reduce stroke mortality and decrease morbidity -Will place in observation status for TIA evaluation -Telemetry monitoring -MRI/MRA -Echo -Risk stratification with FLP; will also check UDS -Neurology consult if MRI is positive -PT/OT/Nutrition Consults

## 2022-09-24 NOTE — ED Provider Notes (Signed)
Patient signed out by Dr. Adela Lank awaiting call back from neuro.  I did speak with Neuro (Dr. Otelia Limes).  As sx have been going on for a few days, he does not need to go ED to ED for an emergent MRI.  He can be admitted by the hospitalist service and get a MRI when he gets a bed at [facility].  Pt d/w Dr. Ophelia Charter (triad) who will admit.   Jacalyn Lefevre, MD 09/24/22 1625

## 2022-09-25 ENCOUNTER — Observation Stay (HOSPITAL_COMMUNITY): Payer: Medicare PPO

## 2022-09-25 DIAGNOSIS — R2990 Unspecified symptoms and signs involving the nervous system: Secondary | ICD-10-CM | POA: Diagnosis not present

## 2022-09-25 DIAGNOSIS — Z823 Family history of stroke: Secondary | ICD-10-CM | POA: Diagnosis not present

## 2022-09-25 DIAGNOSIS — Z79899 Other long term (current) drug therapy: Secondary | ICD-10-CM | POA: Diagnosis not present

## 2022-09-25 DIAGNOSIS — F17210 Nicotine dependence, cigarettes, uncomplicated: Secondary | ICD-10-CM | POA: Diagnosis present

## 2022-09-25 DIAGNOSIS — R7303 Prediabetes: Secondary | ICD-10-CM | POA: Diagnosis present

## 2022-09-25 DIAGNOSIS — I7143 Infrarenal abdominal aortic aneurysm, without rupture: Secondary | ICD-10-CM | POA: Diagnosis present

## 2022-09-25 DIAGNOSIS — R030 Elevated blood-pressure reading, without diagnosis of hypertension: Secondary | ICD-10-CM | POA: Diagnosis present

## 2022-09-25 DIAGNOSIS — Z8042 Family history of malignant neoplasm of prostate: Secondary | ICD-10-CM | POA: Diagnosis not present

## 2022-09-25 DIAGNOSIS — M62838 Other muscle spasm: Secondary | ICD-10-CM | POA: Diagnosis present

## 2022-09-25 DIAGNOSIS — E781 Pure hyperglyceridemia: Secondary | ICD-10-CM | POA: Diagnosis present

## 2022-09-25 DIAGNOSIS — R42 Dizziness and giddiness: Secondary | ICD-10-CM | POA: Diagnosis present

## 2022-09-25 DIAGNOSIS — R29818 Other symptoms and signs involving the nervous system: Secondary | ICD-10-CM | POA: Diagnosis not present

## 2022-09-25 DIAGNOSIS — K5900 Constipation, unspecified: Secondary | ICD-10-CM | POA: Diagnosis not present

## 2022-09-25 DIAGNOSIS — G610 Guillain-Barre syndrome: Secondary | ICD-10-CM | POA: Diagnosis present

## 2022-09-25 LAB — MENINGITIS/ENCEPHALITIS PANEL (CSF)

## 2022-09-25 LAB — CSF CELL COUNT WITH DIFFERENTIAL
RBC Count, CSF: 0 /mm3
RBC Count, CSF: 91 /mm3 — ABNORMAL HIGH
Tube #: 1
Tube #: 4
WBC, CSF: 4 /mm3 (ref 0–5)
WBC, CSF: 9 /mm3 — ABNORMAL HIGH (ref 0–5)

## 2022-09-25 LAB — PROTEIN AND GLUCOSE, CSF
Glucose, CSF: 80 mg/dL — ABNORMAL HIGH (ref 40–70)
Total  Protein, CSF: 89 mg/dL — ABNORMAL HIGH (ref 15–45)

## 2022-09-25 LAB — GRAM STAIN

## 2022-09-25 LAB — LIPID PANEL
Cholesterol: 200 mg/dL (ref 0–200)
HDL: 29 mg/dL — ABNORMAL LOW (ref >40)
LDL Cholesterol: UNDETERMINED mg/dL (ref 0–99)
Total CHOL/HDL Ratio: 6.9 RATIO
Triglycerides: 499 mg/dL — ABNORMAL HIGH (ref <150)
VLDL: UNDETERMINED mg/dL (ref 0–40)

## 2022-09-25 LAB — LDL CHOLESTEROL, DIRECT: Direct LDL: 103 mg/dL — ABNORMAL HIGH (ref 0–99)

## 2022-09-25 MED ORDER — SODIUM CHLORIDE 0.9% FLUSH
3.0000 mL | Freq: Two times a day (BID) | INTRAVENOUS | Status: DC
  Administered 2022-09-25 – 2022-10-01 (×11): 3 mL via INTRAVENOUS

## 2022-09-25 MED ORDER — IMMUNE GLOBULIN (HUMAN) 10 GM/100ML IV SOLN
400.0000 mg/kg | INTRAVENOUS | Status: AC
  Administered 2022-09-25 – 2022-09-29 (×5): 35 g via INTRAVENOUS
  Filled 2022-09-25 (×8): qty 350

## 2022-09-25 MED ORDER — STROKE: EARLY STAGES OF RECOVERY BOOK
Freq: Once | Status: AC
  Filled 2022-09-25 (×2): qty 1

## 2022-09-25 MED ORDER — LIDOCAINE HCL 2 % IJ SOLN
INTRAMUSCULAR | Status: AC
  Administered 2022-09-25: 400 mg
  Filled 2022-09-25: qty 20

## 2022-09-25 NOTE — ED Notes (Signed)
Patient assisted to restroom with wheelchair per request.

## 2022-09-25 NOTE — Progress Notes (Addendum)
Inpatient Rehab Admissions Coordinator:  ? ?Per therapy recommendations,  patient was screened for CIR candidacy by Charlette Hennings, MS, CCC-SLP. At this time, Pt. Appears to be a a potential candidate for CIR. I will place   order for rehab consult per protocol for full assessment. Please contact me any with questions. ? ?Lang Zingg, MS, CCC-SLP ?Rehab Admissions Coordinator  ?[phone] (celll) ?[phone] (office) ? ?

## 2022-09-25 NOTE — Progress Notes (Signed)
Pt able to perform  VC of  1.7L and NIF of -24 with good effort.

## 2022-09-25 NOTE — ED Notes (Signed)
Patient is currently receiving IVIG, no adverse reactions noted. Patient is A&Ox4 without confusion. VSS. Will continue to monitor

## 2022-09-25 NOTE — Progress Notes (Signed)
Patient did NIF -35 and FVC 1.2L with good effort.

## 2022-09-25 NOTE — ED Provider Notes (Signed)
Patient boarding in ED overnight pending admission and workup for TIA. He has been boarding here for >10 hours and per protocol will initiate ED to ED transfer pending same. No issues overnight. Dr. Madilyn Hook accepting.   Vitals:   09/25/22 0300 09/25/22 0500  BP: (!) 153/94 (!) 160/93  Pulse: 72 72  Resp: 16 16  Temp:    SpO2: 97% 98%      Adama Ivins, Barbara Cower, MD 09/25/22 [phone]

## 2022-09-25 NOTE — Assessment & Plan Note (Signed)
-  Mildly elevated glucose, A1c 6.5 -Needs heart healthy diet -Nutrition consult requested

## 2022-09-25 NOTE — H&P (Addendum)
History and Physical    Patient: Joe Greene ZOX:[id number] DOB: 18-May-1951 DOA: 09/24/2022 DOS: the patient was seen and examined on 09/25/2022 PCP: Loyola Mast, MD  Patient coming from: Home - lives with wife; NOK: Wife, Raiden Haydu, [phone]   Chief Complaint: RLE weakness  HPI: Joe Greene is a 71 y.o. male without significant past medical history presenting with RLE weakness.  He was seen in the ER on 11/10 for intractable headache (negative head CT) and in teleconsultation yesterday and reported 3 weeks ago he started with an intractable heache.  He started with numbness in his fingers and dizzy on Thursday.  Friday it was worse with tingling in his fingers and feet and his BP was very high.  Periodic SOB.  They saw his PCP and was sent to the ER.  He had a negative CT and he went home.  Saturday was no better and Sunday was bad.  He went back to the PCP yesterday AM and was sent to the ER after L-spine imaging showed concern for a 6.1 cm aneurysm.    This AM, he reports improvement in dizziness, headache, and SOB.  His residual symptoms are B hand and feet tingling/numbness and reported inability to bear weight on the RLE due to medial calf weakness.  He also reports sporadic abdominal wall muscle spasms that are terribly painful in nature.    ER Course:  Teleconsult done last evening.  Remained at [facility] at all night following televisit and transported ER:ER this AM.     Review of Systems: As mentioned in the history of present illness. All other systems reviewed and are negative. Past Medical History:  Diagnosis Date   Cataract    forming    Past Surgical History:  Procedure Laterality Date   FOOT SURGERY Right    HERNIA REPAIR Right    KNEE ARTHROSCOPY Left    WISDOM TOOTH EXTRACTION     Social History:  reports that he has been smoking cigarettes. He has a 100.00 pack-year smoking history. He has never used smokeless tobacco. He reports current alcohol use of about  5.0 standard drinks of alcohol per week. He reports that he does not use drugs.  No Known Allergies  Family History  Problem Relation Age of Onset   Stroke Mother 58   AAA (abdominal aortic aneurysm) Mother    Cancer Father        Prostate   Colon cancer Neg Hx    Colon polyps Neg Hx    Esophageal cancer Neg Hx    Rectal cancer Neg Hx    Stomach cancer Neg Hx     Prior to Admission medications   Medication Sig Start Date End Date Taking? Authorizing Provider  calcium carbonate (TUMS EX) 750 MG chewable tablet Chew 1,500 mg by mouth 2 (two) times daily as needed for heartburn.   Yes [provider]  ferrous sulfate 325 (65 FE) MG EC tablet Take 325 mg by mouth daily. When able to remember   Yes [provider]  fluticasone (FLONASE) 50 MCG/ACT nasal spray Place 2 sprays into both nostrils 2 (two) times daily as needed for allergies or rhinitis.   Yes [provider]  ibuprofen (ADVIL) 200 MG tablet Take 400 mg by mouth 3 (three) times daily as needed for headache or mild pain.   Yes [provider]  MAGNESIUM PO Take 1 tablet by mouth daily. When able to remember   Yes [provider]  Omega-3  Fatty Acids (FISH OIL PO) Take 1 capsule by mouth daily. When able to remember   Yes [provider]  sildenafil (VIAGRA) 100 MG tablet Take 0.5-1 tablets (50-100 mg total) by mouth daily as needed for erectile dysfunction. Patient taking differently: Take 50 mg by mouth daily as needed for erectile dysfunction. 10/25/21  Yes Loyola Mast, MD  albuterol (VENTOLIN HFA) 108 (90 Base) MCG/ACT inhaler Inhale 2 puffs into the lungs every 6 (six) hours as needed for wheezing or shortness of breath. Patient not taking: Reported on 09/25/2022 09/24/22   Garnette Gunner, MD  hydrOXYzine (VISTARIL) 25 MG capsule Take 1 capsule (25 mg total) by mouth every 8 (eight) hours as needed. Patient not taking: Reported on 09/21/2022 03/16/22   Loyola Mast,  MD  predniSONE (DELTASONE) 50 MG tablet Take 1 tablet daily for 5 days. Patient not taking: Reported on 09/25/2022 09/24/22   Garnette Gunner, MD    Physical Exam: Vitals:   09/25/22 0643 09/25/22 0738 09/25/22 1138 09/25/22 1315  BP:  (!) 143/91 (!) 161/93 (!) 140/95  Pulse:  73 81 79  Resp:  (!) Temp: 98 F (36.7 C)  97.9 F (36.6 C)   TempSrc: Oral  Oral   SpO2:  95% 96% 96%  Weight:      Height:       General:  Appears calm and comfortable and is in NAD Eyes:  PERRL, EOMI, normal lids, iris ENT:  grossly normal hearing, lips & tongue, mmm; appropriate dentition Neck:  no LAD, masses or thyromegaly Cardiovascular:  RRR, no m/r/g. No LE edema.  Respiratory:   CTA bilaterally with no wheezes/rales/rhonchi.  Normal respiratory effort. Abdomen:  soft, NT, ND Skin:  no rash or induration seen on limited exam Musculoskeletal:  grossly normal tone BUE/BLE, good ROM, no bony abnormality Psychiatric:  grossly normal mood and affect, speech fluent and appropriate, AOx3 Neurologic:  CN 2-12 grossly intact, moves all extremities in coordinated fashion, sensation intact   Radiological Exams on Admission: Independently reviewed - see discussion in A/P where applicable  MR BRAIN WO CONTRAST  Result Date: 09/25/2022 CLINICAL DATA:  Neuro deficit, acute, stroke suspected. Balance disturbance. EXAM: MRI HEAD WITHOUT CONTRAST MRA HEAD WITHOUT CONTRAST TECHNIQUE: Multiplanar, multi-echo pulse sequences of the brain and surrounding structures were acquired without intravenous contrast. Angiographic images of the Circle of Willis were acquired using MRA technique without intravenous contrast. COMPARISON:  Head CT 09/21/2022 FINDINGS: MRI HEAD FINDINGS Brain: Diffusion imaging does not show any acute or subacute infarction. No focal insult affects the cerebellum. Minimal chronic small-vessel ischemic change of the pons. Cerebral hemispheres show mild chronic small-vessel ischemic  changes of the white matter. No cortical or large vessel territory infarction. No mass lesion, hemorrhage, hydrocephalus or extra-axial collection. Vascular: Major vessels at the base of the brain show flow. Skull and upper cervical spine: Negative Sinuses/Orbits: Paranasal sinuses are clear. Orbits are normal. There is a mastoid effusion on the left. Other: None MRA HEAD FINDINGS Anterior circulation: Both internal carotid arteries are widely patent through the skull base and siphon regions. The anterior and middle cerebral vessels are normal. No large vessel occlusion or proximal stenosis. No aneurysm or vascular malformation. Posterior circulation: Both vertebral arteries widely patent to the basilar artery. No basilar stenosis. Posterior circulation branch vessels are patent. Artifactual signal loss in the right PCA secondary to tortuosity. No true stenosis suspected. Anatomic variants: None IMPRESSION: 1. No acute brain finding. Mild  chronic small-vessel ischemic change of the pons and cerebral hemispheric white matter. 2. Negative intracranial MR angiography of the large and medium size vessels. 3. Left mastoid effusion. Electronically Signed   By: Paulina Fusi M.D.   On: 09/25/2022 10:40   MR ANGIO HEAD WO CONTRAST  Result Date: 09/25/2022 CLINICAL DATA:  Neuro deficit, acute, stroke suspected. Balance disturbance. EXAM: MRI HEAD WITHOUT CONTRAST MRA HEAD WITHOUT CONTRAST TECHNIQUE: Multiplanar, multi-echo pulse sequences of the brain and surrounding structures were acquired without intravenous contrast. Angiographic images of the Circle of Willis were acquired using MRA technique without intravenous contrast. COMPARISON:  Head CT 09/21/2022 FINDINGS: MRI HEAD FINDINGS Brain: Diffusion imaging does not show any acute or subacute infarction. No focal insult affects the cerebellum. Minimal chronic small-vessel ischemic change of the pons. Cerebral hemispheres show mild chronic small-vessel ischemic changes  of the white matter. No cortical or large vessel territory infarction. No mass lesion, hemorrhage, hydrocephalus or extra-axial collection. Vascular: Major vessels at the base of the brain show flow. Skull and upper cervical spine: Negative Sinuses/Orbits: Paranasal sinuses are clear. Orbits are normal. There is a mastoid effusion on the left. Other: None MRA HEAD FINDINGS Anterior circulation: Both internal carotid arteries are widely patent through the skull base and siphon regions. The anterior and middle cerebral vessels are normal. No large vessel occlusion or proximal stenosis. No aneurysm or vascular malformation. Posterior circulation: Both vertebral arteries widely patent to the basilar artery. No basilar stenosis. Posterior circulation branch vessels are patent. Artifactual signal loss in the right PCA secondary to tortuosity. No true stenosis suspected. Anatomic variants: None IMPRESSION: 1. No acute brain finding. Mild chronic small-vessel ischemic change of the pons and cerebral hemispheric white matter. 2. Negative intracranial MR angiography of the large and medium size vessels. 3. Left mastoid effusion. Electronically Signed   By: Paulina Fusi M.D.   On: 09/25/2022 10:40   CT Angio Chest/Abd/Pel for Dissection W and/or Wo Contrast  Result Date: 09/24/2022 CLINICAL DATA:  Bilateral arm and leg numbness for 4 days. Bilateral leg weakness. Dizziness. EXAM: CT ANGIOGRAPHY CHEST, ABDOMEN AND PELVIS TECHNIQUE: Multidetector CT imaging through the chest, abdomen and pelvis was performed using the standard protocol during bolus administration of intravenous contrast. Multiplanar reconstructed images and MIPs were obtained and reviewed to evaluate the vascular anatomy. RADIATION DOSE REDUCTION: This exam was performed according to the departmental dose-optimization program which includes automated exposure control, adjustment of the mA and/or kV according to patient size and/or use of iterative  reconstruction technique. CONTRAST:  OMNIPAQUE IOHEXOL 350 MG/ML SOLN COMPARISON:  Chest two views and lumbar spine radiographs 09/24/2022; CT chest 01/17/2022; right upper quadrant abdominal ultrasound 08/04/2021 FINDINGS: CTA CHEST FINDINGS Cardiovascular: On precontrast images, no hyperdensity is seen to indicate an intramural hematoma within the thoracic aorta. There are mild-to-moderate atherosclerotic calcifications. Dense coronary artery calcifications. The ascending aorta measures up to 3.6 cm in caliber, not aneurysmal. No thoracic aortic dissection is seen. No central pulmonary embolism is seen. Mediastinum/Nodes: No axillary, mediastinal, or hilar pathologically enlarged lymph nodes by CT criteria. The visualized thyroid is unremarkable. The esophagus follows a normal course and normal caliber. Lungs/Pleura: The central airways are patent. Mild-to-moderate centrilobular and paraseptal emphysematous changes are again seen, greatest within the upper lungs and not significantly changed from prior. No suspicious pulmonary nodule is seen. No acute airspace opacity. No pleural effusion or pneumothorax. Musculoskeletal: Moderate to severe C7-T1 disc space narrowing. Mild multilevel thoracic spine disc space narrowing with lower thoracic  spine anterior endplate osteophytes. No aggressive lytic or blastic osseous lesions. Review of the MIP images confirms the above findings. CTA ABDOMEN AND PELVIS FINDINGS VASCULAR Aorta: There is an infrarenal abdominal aortic aneurysm measuring up to 4.6 x 4.6 cm (transverse by AP) and involving an approximate 6.9 cm length of the abdominal aorta. Moderate posterior right and mild-to-moderate anterior right noncalcified intramural plaque. No dissection is seen. Celiac: Patent without evidence of aneurysm, dissection, vasculitis or significant stenosis. SMA: Patent without evidence of aneurysm, dissection, vasculitis or significant stenosis. Renals: Both renal arteries are  patent without evidence of aneurysm, dissection, vasculitis, fibromuscular dysplasia or significant stenosis. IMA: Takeoff just above the abdominal aortic aneurysm. Patent without evidence of aneurysm, dissection, vasculitis or significant stenosis. Inflow: Just proximal to the common iliac bifurcations, the right common iliac artery measures up to 1.6 cm in the left common iliac artery measures up to 1.6 cm in caliber mildly aneurysmal. Veins: No obvious venous abnormality within the limitations of this arterial phase study. Review of the MIP images confirms the above findings. NON-VASCULAR Hepatobiliary: Smooth liver contours. No gross arterial phase hepatic lesion is seen. The gallbladder is unremarkable. No intrahepatic or extrahepatic biliary ductal dilatation is seen. Pancreas: Unremarkable. No pancreatic ductal dilatation or surrounding inflammatory changes. Spleen: Normal in size without focal abnormality. Incidental note of 6 mm splenule just anterior to the spleen (axial series 6, image 102). Adrenals/Urinary Tract: Normal adrenals. The kidneys enhance uniformly and are symmetric in size without hydronephrosis. No definite focal urinary bladder wall thickening is seen. Stomach/Bowel: Mild to moderate descending colon diverticulosis. Moderate stool throughout the colon. Mild stool within the terminal ileum suggesting chronic slow bowel transit. Normal appendix (axial series 6 images 163 through 169). No dilated loops of bowel to indicate bowel obstruction. Lymphatic: No mesenteric, retroperitoneal, or pelvic lymphadenopathy. Reproductive: The prostate and seminal vesicles are grossly unremarkable. Other: Moderate right and mild left fat containing inguinal hernias. Bilateral vasectomy clips. No free air or free fluid is seen within the abdomen or pelvis. Musculoskeletal: Minimal 2 mm retrolisthesis of L2 on L3. Mild multilevel lumbar spine endplate spurring without significant disc space narrowing. No  aggressive lytic or blastic osseous lesion is seen. Review of the MIP images confirms the above findings. IMPRESSION: 1. Infrarenal abdominal aortic aneurysm measuring up to 4.6 x 4.6 cm. Recommend follow-up CT/MR every 6 months and vascular consultationThis recommendation follows ACR consensus guidelines: White Paper of the ACR Incidental Findings Committee II on Vascular Findings. J Am Coll Radiol 2013; 10:789-794. 2. No thoracic or abdominal aortic dissection. 3. Mild-to-moderate descending colon diverticulosis. 4. Moderate right and mild left fat containing inguinal hernias. Aortic Atherosclerosis (ICD10-I70.0) and Emphysema (ICD10-J43.9). Electronically Signed   By: Neita Garnet M.D.   On: 09/24/2022 15:09   DG Cervical Spine Complete  Result Date: 09/24/2022 CLINICAL DATA:  Neck pain, upper arm numbness EXAM: CERVICAL SPINE - COMPLETE 4+ VIEW COMPARISON:  None Available. FINDINGS: No recent fracture is seen. Lower cervical spine is not optimally visualized in the lateral view related to patient's body habitus. In the AP and oblique views, no fracture is seen in lower cervical spine. Degenerative changes are noted with bony spurs and facet hypertrophy at multiple levels. There is encroachment neural foramina from C3-C7 levels, more so at C5-C6 level. IMPRESSION: No recent fracture is seen. Cervical spondylosis with encroachment of neural foramina from C3-C7 levels, more severe at C5-C6 level. Electronically Signed   By: Ernie Avena M.D.   On: 09/24/2022 10:06  DG Lumbar Spine Complete  Result Date: 09/24/2022 CLINICAL DATA:  Back pain, numbness right leg EXAM: LUMBAR SPINE - COMPLETE 4+ VIEW COMPARISON:  None Available. FINDINGS: No recent fracture is seen. Alignment of posterior margins of vertebral bodies appears normal. Degenerative changes are noted with anterior bony spurs and facet hypertrophy. There is no significant disc space narrowing. In the lateral view, there is evidence of  aneurysmal dilation of abdominal aorta measuring 6.1 cm in AP diameter. IMPRESSION: No recent fracture is seen in the lumbar spine. Lumbar spondylosis with anterior bony spurs and facet hypertrophy. There is aneurysmal dilation of abdominal aorta measuring 6.1 cm in AP diameter. Follow-up contrast enhanced CT should be considered for further evaluation. These results will be called to the ordering clinician or representative by the Radiologist Assistant, and communication documented in the PACS or [employer]. Electronically Signed   By: Ernie AvenaPalani  Rathinasamy M.D.   On: 09/24/2022 10:04   DG Chest 2 View  Result Date: 09/24/2022 CLINICAL DATA:  Shortness of breath EXAM: CHEST - 2 VIEW COMPARISON:  CT chest done on 01/17/2022 FINDINGS: Cardiac size is within normal limits. Thoracic aorta is tortuous and ectatic. There is increase in interstitial markings in both parahilar regions and both lower lung fields. There is no focal pulmonary consolidation. There is no pleural effusion or pneumothorax. IMPRESSION: Increased interstitial markings are seen in parahilar regions and lower lung fields on both sides suggesting possible chronic interstitial lung disease with scarring. Possibility of interstitial pneumonia is not excluded. There is no focal consolidation. There is no pleural effusion or pneumothorax. Electronically Signed   By: Ernie AvenaPalani  Rathinasamy M.D.   On: 09/24/2022 10:01    EKG: Independently reviewed.  NSR with rate 76; LAFB;  no evidence of acute ischemia    Labs on Admission: I have personally reviewed the available labs and imaging studies at the time of the admission.  Pertinent labs:    Glucose 172 Lipids:  200/29/LDL roughly 70/499 Unremarkable CBC Normal UA other than 30 protein Negative UDS A1c 6.5 TSH 6.47, normal T4 and free T3   Assessment and Plan: Principal Problem:   Stroke-like symptoms Active Problems:   Tobacco use   Hypertriglyceridemia   AAA (abdominal aortic  aneurysm) (HCC)   Elevated blood-pressure reading without diagnosis of hypertension   Pre-diabetes    Assessment and Plan: * Stroke-like symptoms -Patient with elevated BP, dizziness, and neurologic symptoms on presentation -Most symptoms are improved but he has persistent B hand and feet numbness/tingling as well as RLE weakness in the medial calf muscle -Concerning for CVA but MRI was negative -Aspirin has been given to reduce stroke mortality and decrease morbidity -MRI was negative for acute ischemia -Patient placed in observation status for overnight monitoring but will convert to admission at this time -Telemetry monitoring -PT/OT Consults -Neurology consult -With negative MRI, alternative etiologies (like GBS, AIDP/CIDP) need to be considered  Pre-diabetes -Mildly elevated glucose, A1c 6.5 -Needs heart healthy diet -Nutrition consult requested  Elevated blood-pressure reading without diagnosis of hypertension -No diagnosis of HTN -Will follow -He may benefit from treatment of HTN  AAA (abdominal aortic aneurysm) (HCC) -CTA ordered due to concern raised on L-sine X-ray -Infrarenal abdominal aortic aneurysm measuring up to 4.6 x 4.6 cm.  -No evidence of dissection -Recommend follow-up CT/MR every 6 months and vascular consultation as an outpatient  Hypertriglyceridemia -This has been uptrending -Needs medication - fish oil vs. Niacin -Will defer to PCP -Has appt with PCP scheduled for next  week  Tobacco use -Encourage cessation.   -Heavy use! -This was discussed with the patient and should be reviewed on an ongoing basis.   -Patch ordered       Advance Care Planning:   Code Status: Full Code   Consults: Neurology; PT/OT; nutrition  DVT Prophylaxis: Lovenox  Family Communication: Wife was present throughout evaluation  Severity of Illness: Admit - It is my clinical opinion that admission to INPATIENT is reasonable and necessary because of the  expectation that this patient will require hospital care that crosses at least 2 midnights to treat this condition based on the medical complexity of the problems presented.  Given the aforementioned information, the predictability of an adverse outcome is felt to be significant.   Author: Jonah Blue, MD 09/25/2022 2:25 PM  For on call review [email or web address].

## 2022-09-25 NOTE — Evaluation (Signed)
Occupational Therapy Evaluation Patient Details Name: Joe Greene MRN: [id number] DOB: 07-Jan-1951 Today's Date: 09/25/2022   History of Present Illness Pt is a 71 y.o. M who presents 09/24/2022 with dizziness, HA, RLE weakness, numbness in his fingers. MRI negative for acute stroke. Significant PMH: none.   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 Pt presents now with overall 5/5 strength though significant deficits in sensation, standing balance and overall coordination. Pt requires Mod Ax 2 for mobility with and without AD, unaware of where B feet were when ambulating placing pt at high risk for falls. Pt requires Setup for UB ADL and up to Mod A for LB ADLs due to deficits. Pt/spouse not comfortable with DC home at this time due to deficits. Based on pt being significantly below baseline, rec AIR level therapies prior to DC home.       Recommendations for follow up therapy are one component of a multi-disciplinary discharge planning process, led by the attending physician.  Recommendations may be updated based on patient status, additional functional criteria and insurance authorization.   Follow Up Recommendations  Acute inpatient rehab (3hours/day)     Assistance Recommended at Discharge Frequent or constant Supervision/Assistance  Patient can return home with the following A lot of help with walking and/or transfers;A lot of help with bathing/dressing/bathroom    Functional Status Assessment  Patient has had a recent decline in their functional status and demonstrates the ability to make significant improvements in function in a reasonable and predictable amount of time.  Equipment Recommendations  Other (comment) (TBD pending progress)    Recommendations for Other Services Rehab consult     Precautions / Restrictions Precautions Precautions: Fall;Other (comment) Precaution Comments: impaired  sensation BLE R>L Restrictions Weight Bearing Restrictions: No      Mobility Bed Mobility Overal bed mobility: Modified Independent             General bed mobility comments: from stretcher    Transfers Overall transfer level: Needs assistance Equipment used: Rolling walker (2 wheels), None Transfers: Sit to/from Stand Sit to Stand: Min guard           General transfer comment: Min guard for safety, increased A/P sway initially      Balance Overall balance assessment: Needs assistance Sitting-balance support: Feet supported Sitting balance-Leahy Scale: Good     Standing balance support: No upper extremity supported, During functional activity Standing balance-Leahy Scale: Poor                             ADL either performed or assessed with clinical judgement   ADL Overall ADL's : Needs assistance/impaired Eating/Feeding: Set up   Grooming: Min guard;Standing   Upper Body Bathing: Set up;Sitting   Lower Body Bathing: Moderate assistance;Sit to/from stand   Upper Body Dressing : Set up;Sitting   Lower Body Dressing: Moderate assistance;Sit to/from stand   Toilet Transfer: Minimal assistance;+2 for physical assistance;+2 for safety/equipment;Ambulation;Rolling walker (2 wheels)   Toileting- Clothing Manipulation and Hygiene: Moderate assistance;Sitting/lateral lean;Sit to/from stand       Functional mobility during ADLs: Minimal assistance;Moderate assistance;+2 for safety/equipment;+2 for physical assistance;Rolling walker (2 wheels) General ADL Comments: Pt with impaired sensation in all extremities, LE > UE with noted proprioception deficits with mobility and frequent LOB/incoordinated steps. Improved with RW trial though still at high risk for falls and below active baseline  Vision Baseline Vision/History: 1 Wears glasses Ability to See in Adequate Light: 0 Adequate Patient Visual Report: No change from baseline Vision  Assessment?: Vision impaired- to be further tested in functional context;No apparent visual deficits     Perception     Praxis      Pertinent Vitals/Pain Pain Assessment Pain Assessment: Faces Faces Pain Scale: Hurts little more Pain Location: RLE Pain Descriptors / Indicators: Discomfort Pain Intervention(s): Monitored during session     Hand Dominance Right   Extremity/Trunk Assessment Upper Extremity Assessment Upper Extremity Assessment: RUE deficits/detail;LUE deficits/detail RUE Deficits / Details: strength BUE 5/5, coordination WFL though difficulty holding to items due to impaired sensation; numbness and tingling   Lower Extremity Assessment Lower Extremity Assessment: Defer to PT evaluation RLE Deficits / Details: Strength 5/5, pt reporting numbness in feet, intact to light touch upon testing. Pt reports increased numbness in R foot compared to L RLE Coordination: decreased gross motor LLE Deficits / Details: Strength 5/5, pt reporting numbness in feet, intact to light touch upon testing   Cervical / Trunk Assessment Cervical / Trunk Assessment: Normal   Communication Communication Communication: No difficulties   Cognition Arousal/Alertness: Awake/alert Behavior During Therapy: WFL for tasks assessed/performed Overall Cognitive Status: Within Functional Limits for tasks assessed                                       General Comments  Wife present    Exercises     Shoulder Instructions      Home Living Family/patient expects to be discharged to:: Private residence Living Arrangements: Spouse/significant other Available Help at Discharge: Family;Available 24 hours/day Type of Home: House Home Access: Stairs to enter [employer] of Steps: 1   Home Layout: Able to live on main level with bedroom/bathroom     Bathroom Shower/Tub: Producer, television/film/video: Standard Bathroom Accessibility: Yes   Home Equipment: Grab  bars - tub/shower;Shower Counsellor (2 wheels)   Additional Comments: DME was wife's after prior hospitalization      Prior Functioning/Environment Prior Level of Function : Independent/Modified Independent             Mobility Comments: Manages farm; provides maintenance          OT Problem List: Impaired balance (sitting and/or standing);Decreased coordination;Impaired sensation;Impaired UE functional use;Decreased knowledge of use of DME or AE      OT Treatment/Interventions: Therapeutic exercise;Self-care/ADL training;DME and/or AE instruction;Energy conservation;Therapeutic activities;Patient/family education    OT Goals(Current goals can be found in the care plan section) Acute Rehab OT Goals Patient Stated Goal: figure out what is wrong, have neuro assessment, not leave today OT Goal Formulation: With patient/family Time For Goal Achievement: 10/09/22 Potential to Achieve Goals: Good  OT Frequency: Min 2X/week    Co-evaluation PT/OT/SLP Co-Evaluation/Treatment: Yes Reason for Co-Treatment: For patient/therapist safety;Other (comment);To address functional/ADL transfers (imminent DC order) PT goals addressed during session: Mobility/safety with mobility OT goals addressed during session: ADL's and self-care;Strengthening/ROM      AM-PAC OT "6 Clicks" Daily Activity     Outcome Measure Help from another person eating meals?: A Little Help from another person taking care of personal grooming?: A Little Help from another person toileting, which includes using toliet, bedpan, or urinal?: A Lot Help from another person bathing (including washing, rinsing, drying)?: A Lot Help from another person to put on and taking off regular upper  body clothing?: A Little Help from another person to put on and taking off regular lower body clothing?: A Lot 6 Click Score: 15   End of Session Equipment Utilized During Treatment: Gait belt;Rolling walker (2  wheels)  Activity Tolerance: Patient tolerated treatment well Patient left: in bed;with call bell/phone within reach;with family/visitor present  OT Visit Diagnosis: Unsteadiness on feet (R26.81);Other abnormalities of gait and mobility (R26.89)                Time: 1771-1657 OT Time Calculation (min): 17 min Charges:  OT General Charges $OT Visit: 1 Visit OT Evaluation $OT Eval Moderate Complexity: 1 Mod  Bradd Canary, OTR/L Acute Rehab Services Office: [phone]   Lorre Munroe 09/25/2022, 12:26 PM

## 2022-09-25 NOTE — Procedures (Signed)
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 Wilford Corner Assistant: Marisue Ivan Delatorre  Risks, benefits and alternatives of the procedure were dicussed with the patient including but not limited to post-LP headache, bleeding, infection, weakness/numbness of legs(radiculopathy), death.    Consent obtained from: Patient   Procedure Note Procedure performed sitting position. The patient was prepped and draped, and using sterile technique a 20 gauge quinke spinal needle was inserted in the L4-5 space.   Opening pressure not measured due to sitting position. Approximately 17 cc of CSF were obtained and sent for analysis.  Patient tolerated the procedure well and blood loss was minimal.    -- Milon Dikes, MD Neurologist [facility] Pager: [phone]

## 2022-09-25 NOTE — ED Notes (Signed)
Patient is currently receiving IVIG, unable to continue to be at bedside due to care of other patients. Charge nurse made aware. Will continue to do hourly checks on patient until done with IVIG. Will continue to monitor

## 2022-09-25 NOTE — Evaluation (Signed)
Physical Therapy Evaluation Patient Details Name: Joe Greene MRN: [id number] DOB: 07/05/1951 Today's Date: 09/25/2022  History of Present Illness  Pt is a 71 y.o. M who presents 09/24/2022 with dizziness, HA, RLE weakness, numbness in his fingers. MRI negative for acute stroke. Significant PMH: none.  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 Pt presents with a gross change from his baseline, reporting numbness in bilateral hands/feet (more so in R foot in comparison to L foot) and demonstrated decreased proprioception, coordination, standing balance, and gait abnormalities. However, pt has 5/5 strength in BLE's with manual muscle testing. Pt requiring moderate assist (+2 safety) for ambulation with and without a walker x 30 ft. Demonstrates erratic RLE step length and width and knee instability. Suspect good progress given PLOF, motivation and family support. Based on deficits, pt would benefit from post acute rehab to address and maximize functional independence. Presents as a high fall risk based on above.     Recommendations for follow up therapy are one component of a multi-disciplinary discharge planning process, led by the attending physician.  Recommendations may be updated based on patient status, additional functional criteria and insurance authorization.  Follow Up Recommendations Acute inpatient rehab (3hours/day)      Assistance Recommended at Discharge Frequent or constant Supervision/Assistance  Patient can return home with the following  A lot of help with walking and/or transfers;Assistance with cooking/housework;Assist for transportation;Help with stairs or ramp for entrance    Equipment Recommendations None recommended by PT (pt has RW)  Recommendations for Other Services  Rehab consult    Functional Status Assessment Patient has had a recent decline in their functional status and demonstrates  the ability to make significant improvements in function in a reasonable and predictable amount of time.     Precautions / Restrictions Precautions Precautions: Fall Restrictions Weight Bearing Restrictions: No      Mobility  Bed Mobility Overal bed mobility: Modified Independent             General bed mobility comments: from stretcher    Transfers Overall transfer level: Needs assistance Equipment used: Rolling walker (2 wheels), None Transfers: Sit to/from Stand Sit to Stand: Min guard           General transfer comment: Min guard for safety, increased A/P sway initially    Ambulation/Gait Ambulation/Gait assistance: Mod assist, +2 safety/equipment Gait Distance (Feet): 30 Feet Assistive device: Rolling walker (2 wheels), None Gait Pattern/deviations: Step-through pattern, Decreased step length - left, Decreased dorsiflexion - right, Decreased weight shift to right Gait velocity: decreased     General Gait Details: Pt with erratic R step length and width, knee instability, and decreased R heel strike at initial contact. Cues for smaller R step length for increased control. Requiring up to modA (+2 safety) for balance with no AD, improved somewhat with use of RW.  Stairs            Wheelchair Mobility    Modified Rankin (Stroke Patients Only) Modified Rankin (Stroke Patients Only) Pre-Morbid Rankin Score: No symptoms Modified Rankin: Moderately severe disability     Balance Overall balance assessment: Needs assistance Sitting-balance support: Feet supported Sitting balance-Leahy Scale: Good     Standing balance support: No upper extremity supported, During functional activity Standing balance-Leahy Scale: Poor   Single Leg Stance - Right Leg: 0 Single Leg Stance - Left Leg: 0  Pertinent Vitals/Pain Pain Assessment Pain Assessment: Faces Faces Pain Scale: Hurts little more Pain Location: RLE Pain  Descriptors / Indicators: Discomfort Pain Intervention(s): Monitored during session    Home Living Family/patient expects to be discharged to:: Private residence Living Arrangements: Spouse/significant other Available Help at Discharge: Family Type of Home: House Home Access: Stairs to enter   Secretary/administrator of Steps: 1   Home Layout: Able to live on main level with bedroom/bathroom Home Equipment: Grab bars - tub/shower;Shower Counsellor (2 wheels)      Prior Function Prior Level of Function : Independent/Modified Independent             Mobility Comments: Manages farm; provides maintenance       Hand Dominance   Dominant Hand: Right    Extremity/Trunk Assessment   Upper Extremity Assessment Upper Extremity Assessment: Defer to OT evaluation    Lower Extremity Assessment Lower Extremity Assessment: RLE deficits/detail;LLE deficits/detail RLE Deficits / Details: Strength 5/5, pt reporting numbness in feet, intact to light touch upon testing. Pt reports increased numbness in R foot compared to L RLE Coordination: decreased gross motor LLE Deficits / Details: Strength 5/5, pt reporting numbness in feet, intact to light touch upon testing    Cervical / Trunk Assessment Cervical / Trunk Assessment: Normal  Communication   Communication: No difficulties  Cognition Arousal/Alertness: Awake/alert Behavior During Therapy: WFL for tasks assessed/performed Overall Cognitive Status: Within Functional Limits for tasks assessed                                          General Comments      Exercises     Assessment/Plan    PT Assessment Patient needs continued PT services  PT Problem List Decreased strength;Decreased balance;Decreased activity tolerance;Decreased mobility;Decreased coordination;Impaired sensation       PT Treatment Interventions DME instruction;Gait training;Stair training;Functional mobility training;Therapeutic  activities;Therapeutic exercise;Balance training;Patient/family education    PT Goals (Current goals can be found in the Care Plan section)  Acute Rehab PT Goals Patient Stated Goal: for symptoms to resolve PT Goal Formulation: With patient/family Time For Goal Achievement: 10/09/22 Potential to Achieve Goals: Good    Frequency Min 4X/week     Co-evaluation PT/OT/SLP Co-Evaluation/Treatment: Yes Reason for Co-Treatment: For patient/therapist safety;To address functional/ADL transfers PT goals addressed during session: Mobility/safety with mobility         AM-PAC PT "6 Clicks" Mobility  Outcome Measure Help needed turning from your back to your side while in a flat bed without using bedrails?: None Help needed moving from lying on your back to sitting on the side of a flat bed without using bedrails?: None Help needed moving to and from a bed to a chair (including a wheelchair)?: A Little Help needed standing up from a chair using your arms (e.g., wheelchair or bedside chair)?: A Little Help needed to walk in hospital room?: A Lot Help needed climbing 3-5 steps with a railing? : A Lot 6 Click Score: 18    End of Session Equipment Utilized During Treatment: Gait belt Activity Tolerance: Patient tolerated treatment well Patient left: in bed;with call bell/phone within reach;with family/visitor present Nurse Communication: Mobility status PT Visit Diagnosis: Unsteadiness on feet (R26.81);Other abnormalities of gait and mobility (R26.89);Difficulty in walking, not elsewhere classified (R26.2)    Time: 7322-0254 PT Time Calculation (min) (ACUTE ONLY): 17 min   Charges:   PT  Evaluation $PT Eval Low Complexity: 1 Low          [name], PT, DPT Acute Rehabilitation Services Office [phone]   [name] 09/25/2022, 11:57 AM

## 2022-09-25 NOTE — Progress Notes (Signed)
At bedside for IVIG assist with primary RN. Educated EDRN on proper administration, dosage, and calculation for this pt. IVIG administration started per protocol with D5W as a carrier fluid. All pump settings correct.

## 2022-09-25 NOTE — ED Notes (Signed)
Wilford Corner MD at bedside discussing GBS and IVIG w/ pt at this time. Yates MD at bedside as well.

## 2022-09-25 NOTE — Consult Note (Addendum)
Neurology Consultation  Reason for Consult: Right lower extremity weakness and numbness to her hands and feet Referring Physician: Dr. Ophelia Charter  CC: Numbness in bilateral hands and feet and lower extremity weakness worse on right than left  History is obtained from: Patient and chart  HPI: Joe Greene is a 71 y.o. male without significant past medical history who presents with worsening right lower extremity weakness preventing him from standing and walking.  About 2 to 3 weeks ago, patient had an upper respiratory infection and then began having headaches.  On Thursday he noticed numbness in his fingers which worsened on Friday and spread to his feet.  Saturday and Sunday he began to notice lower extremity weakness worse on right than left.  He came to the ED on Monday after an outpatient CT scan showed a 6.1 cm abdominal aortic aneurysm.  MRI brain was negative for acute abnormality.  He reports intermittent shortness of breath sometimes waking up at night unable to catch his breath, as well as intermittent abdominal cramps which have been ongoing.  ROS: A complete ROS was performed and is negative except as noted in the HPI.   Past Medical History:  Diagnosis Date   Cataract    forming      Family History  Problem Relation Age of Onset   Stroke Mother 53   AAA (abdominal aortic aneurysm) Mother    Cancer Father        Prostate   Colon cancer Neg Hx    Colon polyps Neg Hx    Esophageal cancer Neg Hx    Rectal cancer Neg Hx    Stomach cancer Neg Hx      Social History:   reports that he has been smoking cigarettes. He has a 100.00 pack-year smoking history. He has never used smokeless tobacco. He reports current alcohol use of about 5.0 standard drinks of alcohol per week. He reports that he does not use drugs.  Medications  Current Facility-Administered Medications:     stroke: early stages of recovery book, , Does not apply, Once, Jonah Blue, MD   0.9 %  sodium chloride  infusion, , Intravenous, Continuous, Jonah Blue, MD, Last Rate: 50 mL/hr at 09/24/22 2232, New Bag at 09/24/22 2232   acetaminophen (TYLENOL) tablet 650 mg, 650 mg, Oral, Q4H PRN, 650 mg at 09/25/22 0550 **OR** acetaminophen (TYLENOL) 160 MG/5ML solution 650 mg, 650 mg, Per Tube, Q4H PRN **OR** acetaminophen (TYLENOL) suppository 650 mg, 650 mg, Rectal, Q4H PRN, Jonah Blue, MD   aspirin suppository 300 mg, 300 mg, Rectal, Daily **OR** aspirin tablet 325 mg, 325 mg, Oral, Daily, Jonah Blue, MD, 325 mg at 09/25/22 1141   enoxaparin (LOVENOX) injection 40 mg, 40 mg, Subcutaneous, Q24H, Jonah Blue, MD, 40 mg at 09/24/22 2228   lidocaine (XYLOCAINE) 2 % (with pres) injection, , , ,    nicotine (NICODERM CQ - dosed in mg/24 hours) patch 21 mg, 21 mg, Transdermal, Daily, Jonah Blue, MD, 21 mg at 09/25/22 1140   ondansetron (ZOFRAN) injection 4 mg, 4 mg, Intravenous, Q6H PRN, Jonah Blue, MD   senna-docusate (Senokot-S) tablet 1 tablet, 1 tablet, Oral, QHS PRN, Jonah Blue, MD, 1 tablet at 09/25/22 1143  Current Outpatient Medications:    calcium carbonate (TUMS EX) 750 MG chewable tablet, Chew 1,500 mg by mouth 2 (two) times daily as needed for heartburn., Disp: , Rfl:    ferrous sulfate 325 (65 FE) MG EC tablet, Take 325 mg by mouth daily. When able  to remember, Disp: , Rfl:    fluticasone (FLONASE) 50 MCG/ACT nasal spray, Place 2 sprays into both nostrils 2 (two) times daily as needed for allergies or rhinitis., Disp: , Rfl:    ibuprofen (ADVIL) 200 MG tablet, Take 400 mg by mouth 3 (three) times daily as needed for headache or mild pain., Disp: , Rfl:    MAGNESIUM PO, Take 1 tablet by mouth daily. When able to remember, Disp: , Rfl:    Omega-3 Fatty Acids (FISH OIL PO), Take 1 capsule by mouth daily. When able to remember, Disp: , Rfl:    sildenafil (VIAGRA) 100 MG tablet, Take 0.5-1 tablets (50-100 mg total) by mouth daily as needed for erectile dysfunction. (Patient  taking differently: Take 50 mg by mouth daily as needed for erectile dysfunction.), Disp: 5 tablet, Rfl: 11   albuterol (VENTOLIN HFA) 108 (90 Base) MCG/ACT inhaler, Inhale 2 puffs into the lungs every 6 (six) hours as needed for wheezing or shortness of breath. (Patient not taking: Reported on 09/25/2022), Disp: 8 g, Rfl: 0   hydrOXYzine (VISTARIL) 25 MG capsule, Take 1 capsule (25 mg total) by mouth every 8 (eight) hours as needed. (Patient not taking: Reported on 09/21/2022), Disp: 30 capsule, Rfl: 2   predniSONE (DELTASONE) 50 MG tablet, Take 1 tablet daily for 5 days. (Patient not taking: Reported on 09/25/2022), Disp: 5 tablet, Rfl: 0   Exam: Current vital signs: BP (!) 161/93 (BP Location: Right Arm)   Pulse 81   Temp 97.9 F (36.6 C) (Oral)   Resp 20   Ht 5\' 10"  (1.778 m)   Wt 96.2 kg   SpO2 96%   BMI 30.42 kg/m  Vital signs in last 24 hours: Temp:  [97.9 F (36.6 C)-98.2 F (36.8 C)] 97.9 F (36.6 C) (11/14 1138) Pulse Rate:  [62-81] 81 (11/14 1138) Resp:  [15-25] 20 (11/14 1138) BP: (135-165)/(73-106) 161/93 (11/14 1138) SpO2:  [91 %-99 %] 96 % (11/14 1138)  GENERAL: Awake, alert, in no acute distress Psych: Affect appropriate for situation, patient is calm and cooperative with examination Head: Normocephalic and atraumatic, without obvious abnormality EENT: Normal conjunctivae, dry mucous membranes, no OP obstruction LUNGS: Normal respiratory effort. Non-labored breathing on room air CV: Regular rate and rhythm on telemetry Extremities: warm, well perfused, without obvious deformity  NEURO:  Mental Status: Awake, alert, and oriented to person, place, time, and situation. He is able to provide a clear and coherent history of present illness. Speech/Language: speech is clear and fluent.   Fluency, and comprehension intact without aphasia  No neglect is noted Cranial Nerves:  II: PERRL  III, IV, VI: EOMI. Lid elevation symmetric and full.  V: Sensation is intact  to light touch and symmetrical to face.   VII: Face is symmetric resting and smiling.  VIII: Hearing intact to voice IX, X: Palate elevation is symmetric. Phonation normal.  XI: Normal sternocleidomastoid and trapezius muscle strength XII: Tongue protrudes midline without fasciculations.   Motor: 5/5 strength to bilateral upper extremities throughout, 4 out of 4 hip flexion on right 4+ out of 4 hip flexion on left, 5 out of 5 knee extension, knee flexion dorsiflexion and plantarflexion bilaterally. Tone is normal. Bulk is normal.  Sensation: Diminished vibration a stocking pattern up to the knees.  Subjective decrease in the fingers but no objective decrease in vibratory and pinprick sensation in the fingers DTRs: Unable to elicit Gait: Deferred   Labs I have reviewed labs in epic and the results pertinent  to this consultation are:   CBC    Component Value Date/Time   WBC 7.5 09/24/2022 1240   RBC 5.11 09/24/2022 1240   HGB 17.1 (H) 09/24/2022 1240   HCT 49.3 09/24/2022 1240   PLT 222 09/24/2022 1240   MCV 96.5 09/24/2022 1240   MCH 33.5 09/24/2022 1240   MCHC 34.7 09/24/2022 1240   RDW 12.9 09/24/2022 1240   LYMPHSABS 2.1 09/24/2022 1240   MONOABS 0.7 09/24/2022 1240   EOSABS 0.4 09/24/2022 1240   BASOSABS 0.1 09/24/2022 1240    CMP     Component Value Date/Time   NA 137 09/24/2022 1240   K 4.0 09/24/2022 1240   CL 104 09/24/2022 1240   CO2 23 09/24/2022 1240   GLUCOSE 172 (H) 09/24/2022 1240   BUN 22 09/24/2022 1240   CREATININE 0.98 09/24/2022 1240   CALCIUM 8.8 (L) 09/24/2022 1240   PROT 7.6 09/24/2022 1240   ALBUMIN 4.0 09/24/2022 1240   AST 28 09/24/2022 1240   ALT 45 (H) 09/24/2022 1240   ALKPHOS 50 09/24/2022 1240   BILITOT 0.6 09/24/2022 1240   GFRNONAA >60 09/24/2022 1240    Lipid Panel     Component Value Date/Time   CHOL 200 09/25/2022 0522   TRIG 499 (H) 09/25/2022 0522   HDL 29 (L) 09/25/2022 0522   CHOLHDL 6.9 09/25/2022 0522   VLDL UNABLE  TO CALCULATE IF TRIGLYCERIDE OVER 400 mg/dL 64/33/2951 8841   LDLCALC UNABLE TO CALCULATE IF TRIGLYCERIDE OVER 400 mg/dL 66/04/3015 0109   LDLDIRECT 86.0 04/03/2022 0956     Imaging I have reviewed the images obtained:  CT-scan of the brain: No acute abnormality  MRI examination of the brain: No acute abnormality, mild chronic small vessel ischemic changes  Assessment: 71 year old patient with no significant past medical history presents with numbness in the hands and feet which started on Thursday and is worsening as well as weakness in the lower extremities, right greater than left.  He had a viral illness about 2 weeks ago.  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  Lumbar puncture performed and study sent for CSF protein as well as cell count.  CT head and MRI brain were negative for acute abnormalities.  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 Note/plan to be edited by MD as needed.  Cortney E Ernestina Columbia , MSN, AGACNP-BC [facility] See Amion for schedule and pager information 09/25/2022 1:04 PM  Attending Neurohospitalist Addendum Patient seen and examined with APP/Resident. Agree with the history and physical as documented above. Agree with the plan as documented, which I helped formulate. I have independently reviewed the chart, obtained history, review of systems and examined the patient.I have personally reviewed pertinent head/neck/spine imaging (CT/MRI). Please feel free to call with any questions.   ADDENDUM CSF shows protein 89. RBC 0 & 91. WBC 9 and 4. CSF glucose 80. Consistent  with albumin cytological dissociation. Recommend 5 days of IVIG. Plan relayed to Dr. Ophelia Charter.  -- Milon Dikes, MD Neurologist [facility] Pager: [phone]

## 2022-09-25 NOTE — ED Notes (Signed)
Pt transported via WC to bathroom.  Pt continues to feel his balance is "off"  Feels like his rt. Legg wants to "give out" Spouse at bedside

## 2022-09-25 NOTE — ED Notes (Signed)
Patient endorses mild headache, tylenol given per [facility].

## 2022-09-25 NOTE — ED Notes (Signed)
Patient A&Ox4, no confusion. VSS,. N/S of any adverse reactions noted. Will continue to monitor

## 2022-09-25 NOTE — Assessment & Plan Note (Signed)
-  This has been uptrending -Needs medication - fish oil vs. Niacin -Will defer to PCP -Has appt with PCP scheduled for next week

## 2022-09-25 NOTE — ED Notes (Signed)
Pt agreeable to IVIG infusion

## 2022-09-26 DIAGNOSIS — G610 Guillain-Barre syndrome: Principal | ICD-10-CM

## 2022-09-26 MED ORDER — LOSARTAN POTASSIUM 25 MG PO TABS
25.0000 mg | ORAL_TABLET | Freq: Every day | ORAL | Status: DC
  Administered 2022-09-26 – 2022-10-01 (×6): 25 mg via ORAL
  Filled 2022-09-26 (×6): qty 1

## 2022-09-26 MED ORDER — DEXTROSE 5 % IV SOLN: Freq: Once | INTRAVENOUS | Status: AC

## 2022-09-26 MED ORDER — MAGNESIUM CITRATE PO SOLN
1.0000 | Freq: Once | ORAL | Status: AC
  Administered 2022-09-26: 1 via ORAL
  Filled 2022-09-26: qty 296

## 2022-09-26 MED ORDER — DIPHENHYDRAMINE HCL 25 MG PO CAPS
25.0000 mg | ORAL_CAPSULE | Freq: Four times a day (QID) | ORAL | Status: DC | PRN
  Administered 2022-09-26 – 2022-09-29 (×3): 25 mg via ORAL
  Filled 2022-09-26 (×3): qty 1

## 2022-09-26 MED ORDER — ADULT MULTIVITAMIN W/MINERALS CH
1.0000 | ORAL_TABLET | Freq: Every day | ORAL | Status: DC
  Administered 2022-09-26 – 2022-10-01 (×6): 1 via ORAL
  Filled 2022-09-26 (×7): qty 1

## 2022-09-26 NOTE — Progress Notes (Signed)
Physical Therapy Treatment Patient Details Name: Joe Greene MRN: [id number] DOB: November 05, 1951 Today's Date: 09/26/2022   History of Present Illness Pt is a 71 y.o. M who presents 09/24/2022 with dizziness, HA, RLE weakness, numbness in his fingers. MRI negative for acute stroke. Significant PMH: none.    PT Comments    Pt making progress towards goals. Pt continues to require up to mod assist during transfers and gait to maintain balance secondary to impaired and decreased sensation of BLE, R>L. Pt with erratic step lengths throughout gait training with focus on smaller stride lengths and decreased gait speed for increased safety. Encouraged pt to complete LE exercises throughout day with focus on eccentric movement for increased control of Les. Pt continues to be highly motivated to return to PLOF and current plan remains appropriate to address deficits and maximize functional independence and decrease caregiver burden. Pt continues to benefit from skilled PT services to progress toward functional mobility goals.    Recommendations for follow up therapy are one component of a multi-disciplinary discharge planning process, led by the attending physician.  Recommendations may be updated based on patient status, additional functional criteria and insurance authorization.  Follow Up Recommendations  Acute inpatient rehab (3hours/day)     Assistance Recommended at Discharge Frequent or constant Supervision/Assistance  Patient can return home with the following A lot of help with walking and/or transfers;Assistance with cooking/housework;Assist for transportation;Help with stairs or ramp for entrance   Equipment Recommendations  None recommended by PT (pt has RW)    Recommendations for Other Services       Precautions / Restrictions Precautions Precautions: Fall;Other (comment) Precaution Comments: impaired sensation BLE R>L Restrictions Weight Bearing Restrictions: No     Mobility  Bed  Mobility Overal bed mobility: Modified Independent                  Transfers Overall transfer level: Needs assistance Equipment used: Rolling walker (2 wheels), None Transfers: Sit to/from Stand Sit to Stand: Min guard, Mod assist           General transfer comment: Min guard for safety, increased A/P sway initially, mod assist to maintain balance without AD    Ambulation/Gait Ambulation/Gait assistance: Mod assist Gait Distance (Feet): 100 Feet (x 2) Assistive device: Rolling walker (2 wheels), None Gait Pattern/deviations: Step-through pattern, Decreased step length - left, Decreased dorsiflexion - right, Decreased weight shift to right Gait velocity: decreased     General Gait Details: Pt with erratic step length and width, knee instability, and decreased heel strike at initial contact. Cues for smaller step length for increased control theruoghout and generally slower gait speed. mod a with RW to maintain balance and prevent falling with intermittent knee buckling   Stairs             Wheelchair Mobility    Modified Rankin (Stroke Patients Only) Modified Rankin (Stroke Patients Only) Pre-Morbid Rankin Score: No symptoms Modified Rankin: Moderately severe disability     Balance Overall balance assessment: Needs assistance Sitting-balance support: Feet supported Sitting balance-Leahy Scale: Good     Standing balance support: No upper extremity supported, During functional activity Standing balance-Leahy Scale: Poor                              Cognition Arousal/Alertness: Awake/alert Behavior During Therapy: WFL for tasks assessed/performed Overall Cognitive Status: Within Functional Limits for tasks assessed  Exercises General Exercises - Lower Extremity Long Arc Quad: AROM, Right, Left, 20 reps, Seated (with concentration on eccentric control) Hip Flexion/Marching: AROM,  Right, Left, 20 reps, Seated (with concentration on eccentric control) Heel Raises: AROM, Right, Left, 20 reps, Seated    General Comments General comments (skin integrity, edema, etc.): wife present and supportive throughout      Pertinent Vitals/Pain Pain Assessment Pain Assessment: Faces Faces Pain Scale: Hurts a little bit Pain Location: back rash Pain Descriptors / Indicators: Discomfort, Other (Comment) (itching)    Home Living                          Prior Function            PT Goals (current goals can now be found in the care plan section) Acute Rehab PT Goals Patient Stated Goal: to get better PT Goal Formulation: With patient/family Time For Goal Achievement: 10/09/22    Frequency    Min 4X/week      PT Plan      Co-evaluation              AM-PAC PT "6 Clicks" Mobility   Outcome Measure  Help needed turning from your back to your side while in a flat bed without using bedrails?: None Help needed moving from lying on your back to sitting on the side of a flat bed without using bedrails?: None Help needed moving to and from a bed to a chair (including a wheelchair)?: A Little Help needed standing up from a chair using your arms (e.g., wheelchair or bedside chair)?: A Little Help needed to walk in hospital room?: A Lot Help needed climbing 3-5 steps with a railing? : A Lot 6 Click Score: 18    End of Session Equipment Utilized During Treatment: Gait belt Activity Tolerance: Patient tolerated treatment well Patient left: in bed;with call bell/phone within reach;with family/visitor present Nurse Communication: Mobility status PT Visit Diagnosis: Unsteadiness on feet (R26.81);Other abnormalities of gait and mobility (R26.89);Difficulty in walking, not elsewhere classified (R26.2)     Time: [id number] PT Time Calculation (min) (ACUTE ONLY): 23 min  Charges:  $Gait Training: 8-22 mins $Therapeutic Exercise: 8-22 mins                      Joe Greene R. PTA Acute Rehabilitation Services Office: [place] [facility] 09/26/2022, 3:31 PM

## 2022-09-26 NOTE — Progress Notes (Signed)
Initial Nutrition Assessment  DOCUMENTATION CODES:  Not applicable  INTERVENTION:  Liberalize diet to regular to promote good PO intake Adjust to ordering assistance MVI with minerals daily  NUTRITION DIAGNOSIS:  Inadequate oral intake related to decreased appetite as evidenced by per patient/family report.  GOAL:  Patient will meet greater than or equal to 90% of their needs  MONITOR:   PO intake, Labs  REASON FOR ASSESSMENT:  Consult Assessment of nutrition requirement/status  ASSESSMENT:  Pt with no significant medical hx presented to ED with RLE weakness and headache x 2 weeks  Neurology performed LP 11/14 and pt confirmed to GBS. IVIG treatment initiated 11/14.  Pt resting in bed at the time of assistance. Reports that he did not receive breakfast this AM. Discussed room service option for his current hospital unit and educated on ordering process. Provided with menu, assisted with ordering lunch and also adjusted to ordering assistance per pt request.  Pt reports that appetite had been normal PTA aside from the few days preceding ER presentation. Did adjust pt to regular diet to promote PO intake.     Nutritionally Relevant Medications: PRN Meds: diphenhydrAMINE, ondansetron, senna-docusate  Labs Reviewed  NUTRITION - FOCUSED PHYSICAL EXAM: Flowsheet Row Most Recent Value  Orbital Region No depletion  Upper Arm Region No depletion  Thoracic and Lumbar Region No depletion  Buccal Region No depletion  Temple Region No depletion  Clavicle Bone Region No depletion  Clavicle and Acromion Bone Region No depletion  Scapular Bone Region No depletion  Dorsal Hand No depletion  Patellar Region No depletion  Anterior Thigh Region No depletion  Posterior Calf Region No depletion  Edema (RD Assessment) None  Hair Reviewed  Eyes Reviewed  Mouth Reviewed  Skin Reviewed  Nails Reviewed    Diet Order:   Diet Order             Diet regular Room service appropriate?  Yes with Assist; Fluid consistency: Thin  Diet effective now                   EDUCATION NEEDS:  No education needs have been identified at this time  Skin:  Skin Assessment: Reviewed RN Assessment  Last BM:  11/12  Height:  Ht Readings from Last 1 Encounters:  09/25/22 5\' 10"  (1.778 m)    Weight:  Wt Readings from Last 1 Encounters:  09/25/22 98.4 kg    Ideal Body Weight:  75.5 kg  BMI:  Body mass index is 31.13 kg/m.  Estimated Nutritional Needs:  Kcal:  1900-2100 kcal/d Protein:  95-110 g/d Fluid:  >/=2L/d    09/27/22, RD, LDN Clinical Dietitian RD pager # available in AMION  After hours/weekend pager # available in [facility]

## 2022-09-26 NOTE — Progress Notes (Signed)
NIF = -28 with excellent effort

## 2022-09-26 NOTE — Progress Notes (Signed)
  Inpatient Rehabilitation Admissions Coordinator   Met with patient and wife at bedside for rehab assessment. We discussed goals and expectations of a possible CIR admit. Noted has received one dose IVIG to date. I await progress with therapy to determine rehab dispo.  Please call me with any questions.   Danne Baxter, RN, MSN Rehab Admissions Coordinator [phone]

## 2022-09-26 NOTE — Plan of Care (Signed)

## 2022-09-26 NOTE — Progress Notes (Signed)
PROGRESS NOTE    Joe Greene  [id number] DOB: Apr 06, 1951 DOA: 09/24/2022 PCP: Haydee Salter, MD   Brief Narrative:  Joe Greene is a 71 y.o. male without significant past medical history presenting with RLE weakness.  He was seen in the ER on 11/10 for intractable headache (negative head CT) and in teleconsultation yesterday and reported 3 weeks ago he started with an intractable heache.  Patient began having ascending numbness in his fingers and toes subsequently involving his feet and hands.  He reports profoundly elevated blood pressure at home as well.  Admitted to hospital service, neurology consulted  Assessment & Plan:   Principal Problem:   Stroke-like symptoms Active Problems:   Tobacco use   Hypertriglyceridemia   AAA (abdominal aortic aneurysm) (HCC)   Elevated blood-pressure reading without diagnosis of hypertension   Pre-diabetes   Bilateral parasthesias Rule out Guillain Barre -MRI negative -LP performed 14th - results pending -Neuro follownig -continue IVIG x5 days   Pre-diabetes -Mildly elevated glucose, A1c 6.5 -Nutrition consult requested   Elevated blood-pressure reading without diagnosis of hypertension -No previous diagnosis of HTN -Start low-dose losartan 25 mg daily  AAA (abdominal aortic aneurysm) (HCC) -CTA ordered due to concern raised on L-sine X-ray -Infrarenal abdominal aortic aneurysm measuring up to 4.6 x 4.6 cm.  -No evidence of dissection -Recommend follow-up CT/MR every 6 months and vascular consultation as an outpatient   Hypertriglyceridemia -This has been uptrending -Needs medication - fish oil vs. Niacin -Will defer to PCP -Has appt with PCP scheduled for next week   Tobacco use -Patch provided   DVT prophylaxis: Lovenox Code Status: Full Family Communication: None present  Status is: Inpatient  Dispo: The patient is from: Home              Anticipated d/c is to: To be determined              Anticipated d/c date is:  24 to 48 hours              Patient currently not medically stable for discharge  Consultants:  Neurology  Procedures:  LP  Antimicrobials:  None  Subjective: No acute issues or events overnight denies nausea vomiting diarrhea constipation headache fevers chills or chest pain.  Paresthesias appear to be similar to previous.  Objective: Vitals:   09/25/22 2130 09/25/22 2134 09/25/22 2308 09/26/22 0410  BP: (!) 150/94  (!) 162/92 (!) 154/89  Pulse: 92  78 66  Resp: 18  16 17   Temp: 98.2 F (36.8 C)  98.2 F (36.8 C) 98 F (36.7 C)  TempSrc: Oral  Oral Oral  SpO2: 96%  95% 93%  Weight:  98.4 kg    Height:  5\' 10"  (1.778 m)      Intake/Output Summary (Last 24 hours) at 09/26/2022 0651 Last data filed at 09/26/2022 0556 Gross per 24 hour  Intake 650 ml  Output 600 ml  Net 50 ml   Filed Weights   09/24/22 1207 09/25/22 2134  Weight: 96.2 kg 98.4 kg    Examination:  General:  Pleasantly resting in bed, No acute distress. HEENT:  Normocephalic atraumatic.  Sclerae nonicteric, noninjected.  Extraocular movements intact bilaterally. Neck:  Without mass or deformity.  Trachea is midline. Lungs:  Clear to auscultate bilaterally without rhonchi, wheeze, or rales. Heart:  Regular rate and rhythm.  Without murmurs, rubs, or gallops. Abdomen:  Soft, nontender, nondistended.  Without guarding or rebound. Extremities: Without cyanosis, clubbing, edema, or obvious deformity.  Decreased sensation distal fingertips and feet. Vascular:  Dorsalis pedis and posterior tibial pulses palpable bilaterally. Skin:  Warm and dry, no erythema, no ulcerations.   Data Reviewed: I have personally reviewed following labs and imaging studies  CBC: Recent Labs  Lab 09/21/22 1705 09/24/22 1240  WBC 7.2 7.5  NEUTROABS  --  4.2  HGB 16.3 17.1*  HCT 46.9 49.3  MCV 96.7 96.5  PLT 235 222   Basic Metabolic Panel: Recent Labs  Lab 09/21/22 1705 09/24/22 1240  NA 136 137  K 4.0 4.0  CL  104 104  CO2 22 23  GLUCOSE 131* 172*  BUN 19 22  CREATININE 1.02 0.98  CALCIUM 8.9 8.8*  MG  --  2.1   GFR: Estimated Creatinine Clearance: 81.4 mL/min (by C-G formula based on SCr of 0.98 mg/dL). Liver Function Tests: Recent Labs  Lab 09/24/22 1240  AST 28  ALT 45*  ALKPHOS 50  BILITOT 0.6  PROT 7.6  ALBUMIN 4.0   Recent Labs  Lab 09/24/22 1240  LIPASE 41   No results for input(s): "AMMONIA" in the last 168 hours. Coagulation Profile: No results for input(s): "INR", "PROTIME" in the last 168 hours. Cardiac Enzymes: No results for input(s): "CKTOTAL", "CKMB", "CKMBINDEX", "TROPONINI" in the last 168 hours. BNP (last 3 results) Recent Labs    09/24/22 0840  PROBNP 12.0   HbA1C: Recent Labs    09/24/22 0840  HGBA1C 6.5   CBG: No results for input(s): "GLUCAP" in the last 168 hours. Lipid Profile: Recent Labs    09/25/22 0522  CHOL 200  HDL 29*  LDLCALC UNABLE TO CALCULATE IF TRIGLYCERIDE OVER 400 mg/dL  TRIG 193*  CHOLHDL 6.9  LDLDIRECT 103*   Thyroid Function Tests: Recent Labs    09/24/22 0840  TSH 6.47*  T4TOTAL 6.7   Anemia Panel: Recent Labs    09/24/22 0840  VITAMINB12 196*  FOLATE 17.2   Sepsis Labs: No results for input(s): "PROCALCITON", "LATICACIDVEN" in the last 168 hours.  Recent Results (from the past 240 hour(s))  Gram stain     Status: None   Collection Time: 09/25/22 12:54 PM   Specimen: CSF; Cerebrospinal Fluid  Result Value Ref Range Status   Specimen Description CSF  Final   Special Requests NONE  Final   Gram Stain   Final    WBC PRESENT, PREDOMINANTLY MONONUCLEAR NO ORGANISMS SEEN CYTOSPIN SMEAR Performed at [facility], [address], [place], [place] [address]    Report Status 09/25/2022 FINAL  Final         Radiology Studies: MR BRAIN WO CONTRAST  Result Date: 09/25/2022 CLINICAL DATA:  Neuro deficit, acute, stroke suspected. Balance disturbance. EXAM: MRI HEAD WITHOUT CONTRAST MRA HEAD  WITHOUT CONTRAST TECHNIQUE: Multiplanar, multi-echo pulse sequences of the brain and surrounding structures were acquired without intravenous contrast. Angiographic images of the Circle of Willis were acquired using MRA technique without intravenous contrast. COMPARISON:  Head CT 09/21/2022 FINDINGS: MRI HEAD FINDINGS Brain: Diffusion imaging does not show any acute or subacute infarction. No focal insult affects the cerebellum. Minimal chronic small-vessel ischemic change of the pons. Cerebral hemispheres show mild chronic small-vessel ischemic changes of the white matter. No cortical or large vessel territory infarction. No mass lesion, hemorrhage, hydrocephalus or extra-axial collection. Vascular: Major vessels at the base of the brain show flow. Skull and upper cervical spine: Negative Sinuses/Orbits: Paranasal sinuses are clear. Orbits are normal. There is a mastoid effusion on the left. Other:  None MRA HEAD FINDINGS Anterior circulation: Both internal carotid arteries are widely patent through the skull base and siphon regions. The anterior and middle cerebral vessels are normal. No large vessel occlusion or proximal stenosis. No aneurysm or vascular malformation. Posterior circulation: Both vertebral arteries widely patent to the basilar artery. No basilar stenosis. Posterior circulation branch vessels are patent. Artifactual signal loss in the right PCA secondary to tortuosity. No true stenosis suspected. Anatomic variants: None IMPRESSION: 1. No acute brain finding. Mild chronic small-vessel ischemic change of the pons and cerebral hemispheric white matter. 2. Negative intracranial MR angiography of the large and medium size vessels. 3. Left mastoid effusion. Electronically Signed   By: Nelson Chimes M.D.   On: 09/25/2022 10:40   MR ANGIO HEAD WO CONTRAST  Result Date: 09/25/2022 CLINICAL DATA:  Neuro deficit, acute, stroke suspected. Balance disturbance. EXAM: MRI HEAD WITHOUT CONTRAST MRA HEAD WITHOUT  CONTRAST TECHNIQUE: Multiplanar, multi-echo pulse sequences of the brain and surrounding structures were acquired without intravenous contrast. Angiographic images of the Circle of Willis were acquired using MRA technique without intravenous contrast. COMPARISON:  Head CT 09/21/2022 FINDINGS: MRI HEAD FINDINGS Brain: Diffusion imaging does not show any acute or subacute infarction. No focal insult affects the cerebellum. Minimal chronic small-vessel ischemic change of the pons. Cerebral hemispheres show mild chronic small-vessel ischemic changes of the white matter. No cortical or large vessel territory infarction. No mass lesion, hemorrhage, hydrocephalus or extra-axial collection. Vascular: Major vessels at the base of the brain show flow. Skull and upper cervical spine: Negative Sinuses/Orbits: Paranasal sinuses are clear. Orbits are normal. There is a mastoid effusion on the left. Other: None MRA HEAD FINDINGS Anterior circulation: Both internal carotid arteries are widely patent through the skull base and siphon regions. The anterior and middle cerebral vessels are normal. No large vessel occlusion or proximal stenosis. No aneurysm or vascular malformation. Posterior circulation: Both vertebral arteries widely patent to the basilar artery. No basilar stenosis. Posterior circulation branch vessels are patent. Artifactual signal loss in the right PCA secondary to tortuosity. No true stenosis suspected. Anatomic variants: None IMPRESSION: 1. No acute brain finding. Mild chronic small-vessel ischemic change of the pons and cerebral hemispheric white matter. 2. Negative intracranial MR angiography of the large and medium size vessels. 3. Left mastoid effusion. Electronically Signed   By: Nelson Chimes M.D.   On: 09/25/2022 10:40   CT Angio Chest/Abd/Pel for Dissection W and/or Wo Contrast  Result Date: 09/24/2022 CLINICAL DATA:  Bilateral arm and leg numbness for 4 days. Bilateral leg weakness. Dizziness. EXAM:  CT ANGIOGRAPHY CHEST, ABDOMEN AND PELVIS TECHNIQUE: Multidetector CT imaging through the chest, abdomen and pelvis was performed using the standard protocol during bolus administration of intravenous contrast. Multiplanar reconstructed images and MIPs were obtained and reviewed to evaluate the vascular anatomy. RADIATION DOSE REDUCTION: This exam was performed according to the departmental dose-optimization program which includes automated exposure control, adjustment of the mA and/or kV according to patient size and/or use of iterative reconstruction technique. CONTRAST:  140mL OMNIPAQUE IOHEXOL 350 MG/ML SOLN COMPARISON:  Chest two views and lumbar spine radiographs 09/24/2022; CT chest 01/17/2022; right upper quadrant abdominal ultrasound 08/04/2021 FINDINGS: CTA CHEST FINDINGS Cardiovascular: On precontrast images, no hyperdensity is seen to indicate an intramural hematoma within the thoracic aorta. There are mild-to-moderate atherosclerotic calcifications. Dense coronary artery calcifications. The ascending aorta measures up to 3.6 cm in caliber, not aneurysmal. No thoracic aortic dissection is seen. No central pulmonary embolism is seen. Mediastinum/Nodes: No  axillary, mediastinal, or hilar pathologically enlarged lymph nodes by CT criteria. The visualized thyroid is unremarkable. The esophagus follows a normal course and normal caliber. Lungs/Pleura: The central airways are patent. Mild-to-moderate centrilobular and paraseptal emphysematous changes are again seen, greatest within the upper lungs and not significantly changed from prior. No suspicious pulmonary nodule is seen. No acute airspace opacity. No pleural effusion or pneumothorax. Musculoskeletal: Moderate to severe C7-T1 disc space narrowing. Mild multilevel thoracic spine disc space narrowing with lower thoracic spine anterior endplate osteophytes. No aggressive lytic or blastic osseous lesions. Review of the MIP images confirms the above findings.  CTA ABDOMEN AND PELVIS FINDINGS VASCULAR Aorta: There is an infrarenal abdominal aortic aneurysm measuring up to 4.6 x 4.6 cm (transverse by AP) and involving an approximate 6.9 cm length of the abdominal aorta. Moderate posterior right and mild-to-moderate anterior right noncalcified intramural plaque. No dissection is seen. Celiac: Patent without evidence of aneurysm, dissection, vasculitis or significant stenosis. SMA: Patent without evidence of aneurysm, dissection, vasculitis or significant stenosis. Renals: Both renal arteries are patent without evidence of aneurysm, dissection, vasculitis, fibromuscular dysplasia or significant stenosis. IMA: Takeoff just above the abdominal aortic aneurysm. Patent without evidence of aneurysm, dissection, vasculitis or significant stenosis. Inflow: Just proximal to the common iliac bifurcations, the right common iliac artery measures up to 1.6 cm in the left common iliac artery measures up to 1.6 cm in caliber mildly aneurysmal. Veins: No obvious venous abnormality within the limitations of this arterial phase study. Review of the MIP images confirms the above findings. NON-VASCULAR Hepatobiliary: Smooth liver contours. No gross arterial phase hepatic lesion is seen. The gallbladder is unremarkable. No intrahepatic or extrahepatic biliary ductal dilatation is seen. Pancreas: Unremarkable. No pancreatic ductal dilatation or surrounding inflammatory changes. Spleen: Normal in size without focal abnormality. Incidental note of 6 mm splenule just anterior to the spleen (axial series 6, image 102). Adrenals/Urinary Tract: Normal adrenals. The kidneys enhance uniformly and are symmetric in size without hydronephrosis. No definite focal urinary bladder wall thickening is seen. Stomach/Bowel: Mild to moderate descending colon diverticulosis. Moderate stool throughout the colon. Mild stool within the terminal ileum suggesting chronic slow bowel transit. Normal appendix (axial series  6 images 163 through 169). No dilated loops of bowel to indicate bowel obstruction. Lymphatic: No mesenteric, retroperitoneal, or pelvic lymphadenopathy. Reproductive: The prostate and seminal vesicles are grossly unremarkable. Other: Moderate right and mild left fat containing inguinal hernias. Bilateral vasectomy clips. No free air or free fluid is seen within the abdomen or pelvis. Musculoskeletal: Minimal 2 mm retrolisthesis of L2 on L3. Mild multilevel lumbar spine endplate spurring without significant disc space narrowing. No aggressive lytic or blastic osseous lesion is seen. Review of the MIP images confirms the above findings. IMPRESSION: 1. Infrarenal abdominal aortic aneurysm measuring up to 4.6 x 4.6 cm. Recommend follow-up CT/MR every 6 months and vascular consultationThis recommendation follows ACR consensus guidelines: White Paper of the ACR Incidental Findings Committee II on Vascular Findings. J Am Coll Radiol 2013; 10:789-794. 2. No thoracic or abdominal aortic dissection. 3. Mild-to-moderate descending colon diverticulosis. 4. Moderate right and mild left fat containing inguinal hernias. Aortic Atherosclerosis (ICD10-I70.0) and Emphysema (ICD10-J43.9). Electronically Signed   By: Yvonne Kendall M.D.   On: 09/24/2022 15:09   DG Cervical Spine Complete  Result Date: 09/24/2022 CLINICAL DATA:  Neck pain, upper arm numbness EXAM: CERVICAL SPINE - COMPLETE 4+ VIEW COMPARISON:  None Available. FINDINGS: No recent fracture is seen. Lower cervical spine is not optimally visualized  in the lateral view related to patient's body habitus. In the AP and oblique views, no fracture is seen in lower cervical spine. Degenerative changes are noted with bony spurs and facet hypertrophy at multiple levels. There is encroachment neural foramina from C3-C7 levels, more so at C5-C6 level. IMPRESSION: No recent fracture is seen. Cervical spondylosis with encroachment of neural foramina from C3-C7 levels, more severe  at C5-C6 level. Electronically Signed   By: Elmer Picker M.D.   On: 09/24/2022 10:06   DG Lumbar Spine Complete  Result Date: 09/24/2022 CLINICAL DATA:  Back pain, numbness right leg EXAM: LUMBAR SPINE - COMPLETE 4+ VIEW COMPARISON:  None Available. FINDINGS: No recent fracture is seen. Alignment of posterior margins of vertebral bodies appears normal. Degenerative changes are noted with anterior bony spurs and facet hypertrophy. There is no significant disc space narrowing. In the lateral view, there is evidence of aneurysmal dilation of abdominal aorta measuring 6.1 cm in AP diameter. IMPRESSION: No recent fracture is seen in the lumbar spine. Lumbar spondylosis with anterior bony spurs and facet hypertrophy. There is aneurysmal dilation of abdominal aorta measuring 6.1 cm in AP diameter. Follow-up contrast enhanced CT should be considered for further evaluation. These results will be called to the ordering clinician or representative by the Radiologist Assistant, and communication documented in the PACS or [employer]. Electronically Signed   By: Elmer Picker M.D.   On: 09/24/2022 10:04   DG Chest 2 View  Result Date: 09/24/2022 CLINICAL DATA:  Shortness of breath EXAM: CHEST - 2 VIEW COMPARISON:  CT chest done on 01/17/2022 FINDINGS: Cardiac size is within normal limits. Thoracic aorta is tortuous and ectatic. There is increase in interstitial markings in both parahilar regions and both lower lung fields. There is no focal pulmonary consolidation. There is no pleural effusion or pneumothorax. IMPRESSION: Increased interstitial markings are seen in parahilar regions and lower lung fields on both sides suggesting possible chronic interstitial lung disease with scarring. Possibility of interstitial pneumonia is not excluded. There is no focal consolidation. There is no pleural effusion or pneumothorax. Electronically Signed   By: Elmer Picker M.D.   On: 09/24/2022 10:01         Scheduled Meds:  aspirin  300 mg Rectal Daily   Or   aspirin  325 mg Oral Daily   enoxaparin (LOVENOX) injection  40 mg Subcutaneous Q24H   nicotine  21 mg Transdermal Daily   sodium chloride flush  3 mL Intravenous Q12H   Continuous Infusions:  Immune Globulin 10% Stopped (09/25/22 2020)     LOS: 1 day    Time spent: 30min  Stephie Xu C Aeson Sawyers, DO [facility]  If 7PM-7AM, please contact night-coverage [email or web address]  09/26/2022, 6:51 AM

## 2022-09-26 NOTE — Progress Notes (Signed)
Neurology Progress Note   S:// Seen and examined Reported some difficulty breathing for which she was put on supplemental oxygen NIF- -28.   O:// Current vital signs: BP (!) 155/93 (BP Location: Left Arm)   Pulse 67   Temp 97.7 F (36.5 C) (Oral)   Resp 18   Ht 5\' 10"  (1.778 m)   Wt 98.4 kg   SpO2 93%   BMI 31.13 kg/m  Vital signs in last 24 hours: Temp:  [97.4 F (36.3 C)-98.6 F (37 C)] 97.7 F (36.5 C) (11/15 0716) Pulse Rate:  [66-103] 67 (11/15 0716) Resp:  [16-26] 18 (11/15 0716) BP: (140-167)/(83-98) 155/93 (11/15 0716) SpO2:  [93 %-98 %] 93 % (11/15 0716) Weight:  [98.4 kg] 98.4 kg (11/14 2134) GENERAL: Awake, alert in NAD HEENT: - Normocephalic and atraumatic, dry mm, no LN++, no Thyromegally LUNGS - Clear to auscultation bilaterally with no wheezes CV - S1S2 RRR, no m/r/g, equal pulses bilaterally. ABDOMEN - Soft, nontender, nondistended with normoactive BS Ext: warm, well perfused, intact peripheral pulses,  Neurologic examination Awake alert oriented x3 No dysarthria No aphasia Cranial nerves II to XII intact Motor examination with mild flexor weakness but otherwise intact. Sensation with stocking type decrease in the legs up to the knee.  Subjective decreased sensation in the fingertips but on objectively testing with pinprick and tuning fork, feels about the same in upper extremities. Coordination with no dysmetria DTRs: Absent  Medications  Current Facility-Administered Medications:    acetaminophen (TYLENOL) tablet 650 mg, 650 mg, Oral, Q4H PRN, 650 mg at 09/26/22 0848 **OR** acetaminophen (TYLENOL) 160 MG/5ML solution 650 mg, 650 mg, Per Tube, Q4H PRN **OR** acetaminophen (TYLENOL) suppository 650 mg, 650 mg, Rectal, Q4H PRN, 09/28/22, MD   aspirin suppository 300 mg, 300 mg, Rectal, Daily **OR** aspirin tablet 325 mg, 325 mg, Oral, Daily, Jonah Blue, MD, 325 mg at 09/26/22 0848   diphenhydrAMINE (BENADRYL) capsule 25 mg, 25 mg, Oral,  Q6H PRN, Mansy, Jan A, MD   enoxaparin (LOVENOX) injection 40 mg, 40 mg, Subcutaneous, Q24H, Feb, MD, 40 mg at 09/25/22 2224   Immune Globulin 10% (PRIVIGEN) IV infusion 35 g, 400 mg/kg (Adjusted), Intravenous, Q24 Hr x 5, 2225, MD, Stopped at 09/25/22 2020   nicotine (NICODERM CQ - dosed in mg/24 hours) patch 21 mg, 21 mg, Transdermal, Daily, 09/27/22, MD, 21 mg at 09/26/22 0850   ondansetron (ZOFRAN) injection 4 mg, 4 mg, Intravenous, Q6H PRN, 09/28/22, MD   senna-docusate (Senokot-S) tablet 1 tablet, 1 tablet, Oral, QHS PRN, Jonah Blue, MD, 1 tablet at 09/25/22 1143   sodium chloride flush (NS) 0.9 % injection 3 mL, 3 mL, Intravenous, Q12H, 09/27/22, MD, 3 mL at 09/26/22 0909 Labs CBC    Component Value Date/Time   WBC 7.5 09/24/2022 1240   RBC 5.11 09/24/2022 1240   HGB 17.1 (H) 09/24/2022 1240   HCT 49.3 09/24/2022 1240   PLT 222 09/24/2022 1240   MCV 96.5 09/24/2022 1240   MCH 33.5 09/24/2022 1240   MCHC 34.7 09/24/2022 1240   RDW 12.9 09/24/2022 1240   LYMPHSABS 2.1 09/24/2022 1240   MONOABS 0.7 09/24/2022 1240   EOSABS 0.4 09/24/2022 1240   BASOSABS 0.1 09/24/2022 1240    CMP     Component Value Date/Time   NA 137 09/24/2022 1240   K 4.0 09/24/2022 1240   CL 104 09/24/2022 1240   CO2 23 09/24/2022 1240   GLUCOSE 172 (H) 09/24/2022 1240  BUN 22 09/24/2022 1240   CREATININE 0.98 09/24/2022 1240   CALCIUM 8.8 (L) 09/24/2022 1240   PROT 7.6 09/24/2022 1240   ALBUMIN 4.0 09/24/2022 1240   AST 28 09/24/2022 1240   ALT 45 (H) 09/24/2022 1240   ALKPHOS 50 09/24/2022 1240   BILITOT 0.6 09/24/2022 1240   GFRNONAA >60 09/24/2022 1240    Imaging I have reviewed images in epic and the results pertinent to this consultation are: MR brain negative for acute stroke  LP done-CSF with protein 89, glucose 80, RBC 0 and 91, WBC 9 and 4.   Assessment: 71 year old with no significant past medical history presenting with numbness  in hands and feet that started a few days ago and worsening weakness in the lower extremities with the preceding viral illness about a few weeks ago who has lack of reflexes and and a sending type sensory loss with CSF showing albumin cytological dissociation. 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 Started on IVIG yesterday.  Impression: Guillain-Barr syndrome  Recommendations: Continue 5 days of IVIG-first dose was 09/25/2022.  Aspirin while he is on IVIG DVT prophylaxis Counseled on smoking cessation Monitor breathing carefully-with NIF and FVCs - OK to do q12 hours. If dropping, do more frequent checks. We will continue to follow D/W Dr Natale Milch  -- Milon Dikes, MD Neurologist [facility] Pager: [phone]

## 2022-09-26 NOTE — Progress Notes (Signed)
  Transition of Care [facility]) Screening Note   Patient Details  Name: Joe Greene Date of Birth: 01/26/1951   Transition of Care [facility]) CM/SW Contact:    Kermit Balo, RN Phone Number: 09/26/2022, 2:23 PM   Pt is from home with spouse. Currently receiving IVIG. CIR following. Transition of Care Department [facility]) has reviewed patient. We will continue to monitor patient advancement through interdisciplinary progression rounds. If new patient transition needs arise, please place a TOC consult.

## 2022-09-26 NOTE — Progress Notes (Signed)
NIF -40 great effort.

## 2022-09-27 MED ORDER — DIPHENHYDRAMINE HCL 50 MG/ML IJ SOLN
25.0000 mg | Freq: Once | INTRAMUSCULAR | Status: AC
  Administered 2022-09-27: 25 mg via INTRAVENOUS
  Filled 2022-09-27: qty 1

## 2022-09-27 NOTE — Progress Notes (Signed)
PROGRESS NOTE    Joe Greene  WUJ:[id number] DOB: 1951-05-14 DOA: 09/24/2022 PCP: Loyola Mast, MD   Brief Narrative:  Joe Greene is a 71 y.o. male without significant past medical history presenting with RLE weakness.  He was seen in the ER on 11/10 for intractable headache (negative head CT) and in teleconsultation yesterday and reported 3 weeks ago he started with an intractable heache.  Patient began having ascending numbness in his fingers and toes subsequently involving his feet and hands.  He reports profoundly elevated blood pressure at home as well.  Admitted to hospital service, neurology consulted  Assessment & Plan:   Principal Problem:   Stroke-like symptoms Active Problems:   Tobacco use   Hypertriglyceridemia   AAA (abdominal aortic aneurysm) (HCC)   Elevated blood-pressure reading without diagnosis of hypertension   Pre-diabetes   Bilateral parasthesias Rule out Guillain Barre -MRI negative -LP performed 14th - results pending -Neuro follownig -continue IVIG x5 days -last dose 09/29/2022 -Symptoms slowly improving   Pre-diabetes -Mildly elevated glucose, A1c 6.5 -Nutrition consult requested   Elevated blood-pressure reading without diagnosis of hypertension -No previous diagnosis of HTN -Continue low-dose losartan 25 mg daily  AAA (abdominal aortic aneurysm) (HCC) -CTA ordered due to concern raised on L-sine X-ray -Infrarenal abdominal aortic aneurysm measuring up to 4.6 x 4.6 cm.  -No evidence of dissection -Recommend follow-up CT/MR every 6 months and vascular consultation as an outpatient   Hypertriglyceridemia -This has been uptrending -Needs medication - fish oil vs. Niacin -Will defer to PCP -Has appt with PCP scheduled for next week   Tobacco use -Patch provided   DVT prophylaxis: Lovenox Code Status: Full Family Communication: Wife at bedside  Status is: Inpatient  Dispo: The patient is from: Home              Anticipated d/c is to:  To be determined              Anticipated d/c date is: 24 to 48 hours              Patient currently not medically stable for discharge  Consultants:  Neurology  Procedures:  LP  Antimicrobials:  None  Subjective: No acute issues or events overnight denies nausea vomiting diarrhea constipation headache fevers chills or chest pain.  Improving dexterity/tactile feeling in hands, paresthesias minimally improving.  Objective: Vitals:   09/26/22 2147 09/26/22 2217 09/26/22 2315 09/27/22 0312  BP: (!) 149/101 (!) 162/100 (!) 148/95 (!) 144/91  Pulse: 78 72 78 76  Resp:   18 18  Temp: 98.3 F (36.8 C)  98.2 F (36.8 C) 98 F (36.7 C)  TempSrc: Oral  Oral Oral  SpO2: 97% 96% 96% 96%  Weight:      Height:        Intake/Output Summary (Last 24 hours) at 09/27/2022 0719 Last data filed at 09/26/2022 2300 Gross per 24 hour  Intake 103.86 ml  Output 400 ml  Net -296.14 ml    Filed Weights   09/24/22 1207 09/25/22 2134  Weight: 96.2 kg 98.4 kg    Examination:  General:  Pleasantly resting in bed, No acute distress. HEENT:  Normocephalic atraumatic.  Sclerae nonicteric, noninjected.  Extraocular movements intact bilaterally. Neck:  Without mass or deformity.  Trachea is midline. Lungs:  Clear to auscultate bilaterally without rhonchi, wheeze, or rales. Heart:  Regular rate and rhythm.  Without murmurs, rubs, or gallops. Abdomen:  Soft, nontender, nondistended.  Without guarding or rebound. Extremities: Without  cyanosis, clubbing, edema, or obvious deformity.  Decreased sensation distal fingertips and feet. Vascular:  Dorsalis pedis and posterior tibial pulses palpable bilaterally. Skin:  Warm and dry, no erythema, no ulcerations.   Data Reviewed: I have personally reviewed following labs and imaging studies  CBC: Recent Labs  Lab 09/21/22 1705 09/24/22 1240  WBC 7.2 7.5  NEUTROABS  --  4.2  HGB 16.3 17.1*  HCT 46.9 49.3  MCV 96.7 96.5  PLT 235 222    Basic  Metabolic Panel: Recent Labs  Lab 09/21/22 1705 09/24/22 1240  NA 136 137  K 4.0 4.0  CL 104 104  CO2 22 23  GLUCOSE 131* 172*  BUN 19 22  CREATININE 1.02 0.98  CALCIUM 8.9 8.8*  MG  --  2.1    GFR: Estimated Creatinine Clearance: 81.4 mL/min (by C-G formula based on SCr of 0.98 mg/dL). Liver Function Tests: Recent Labs  Lab 09/24/22 1240  AST 28  ALT 45*  ALKPHOS 50  BILITOT 0.6  PROT 7.6  ALBUMIN 4.0    Recent Labs  Lab 09/24/22 1240  LIPASE 41    No results for input(s): "AMMONIA" in the last 168 hours. Coagulation Profile: No results for input(s): "INR", "PROTIME" in the last 168 hours. Cardiac Enzymes: No results for input(s): "CKTOTAL", "CKMB", "CKMBINDEX", "TROPONINI" in the last 168 hours. BNP (last 3 results) Recent Labs    09/24/22 0840  PROBNP 12.0    HbA1C: Recent Labs    09/24/22 0840  HGBA1C 6.5    CBG: No results for input(s): "GLUCAP" in the last 168 hours. Lipid Profile: Recent Labs    09/25/22 0522  CHOL 200  HDL 29*  LDLCALC UNABLE TO CALCULATE IF TRIGLYCERIDE OVER 400 mg/dL  TRIG 789*  CHOLHDL 6.9  LDLDIRECT 103*    Thyroid Function Tests: Recent Labs    09/24/22 0840  TSH 6.47*  T4TOTAL 6.7    Anemia Panel: Recent Labs    09/24/22 0840  VITAMINB12 196*  FOLATE 17.2    Sepsis Labs: No results for input(s): "PROCALCITON", "LATICACIDVEN" in the last 168 hours.  Recent Results (from the past 240 hour(s))  Gram stain     Status: None   Collection Time: 09/25/22 12:54 PM   Specimen: CSF; Cerebrospinal Fluid  Result Value Ref Range Status   Specimen Description CSF  Final   Special Requests NONE  Final   Gram Stain   Final    WBC PRESENT, PREDOMINANTLY MONONUCLEAR NO ORGANISMS SEEN CYTOSPIN SMEAR Performed at [facility], [address] [place], [place] [address]    Report Status 09/25/2022 FINAL  Final         Radiology Studies: MR BRAIN WO CONTRAST  Result Date: 09/25/2022 CLINICAL  DATA:  Neuro deficit, acute, stroke suspected. Balance disturbance. EXAM: MRI HEAD WITHOUT CONTRAST MRA HEAD WITHOUT CONTRAST TECHNIQUE: Multiplanar, multi-echo pulse sequences of the brain and surrounding structures were acquired without intravenous contrast. Angiographic images of the Circle of Willis were acquired using MRA technique without intravenous contrast. COMPARISON:  Head CT 09/21/2022 FINDINGS: MRI HEAD FINDINGS Brain: Diffusion imaging does not show any acute or subacute infarction. No focal insult affects the cerebellum. Minimal chronic small-vessel ischemic change of the pons. Cerebral hemispheres show mild chronic small-vessel ischemic changes of the white matter. No cortical or large vessel territory infarction. No mass lesion, hemorrhage, hydrocephalus or extra-axial collection. Vascular: Major vessels at the base of the brain show flow. Skull and upper cervical spine: Negative Sinuses/Orbits:  Paranasal sinuses are clear. Orbits are normal. There is a mastoid effusion on the left. Other: None MRA HEAD FINDINGS Anterior circulation: Both internal carotid arteries are widely patent through the skull base and siphon regions. The anterior and middle cerebral vessels are normal. No large vessel occlusion or proximal stenosis. No aneurysm or vascular malformation. Posterior circulation: Both vertebral arteries widely patent to the basilar artery. No basilar stenosis. Posterior circulation branch vessels are patent. Artifactual signal loss in the right PCA secondary to tortuosity. No true stenosis suspected. Anatomic variants: None IMPRESSION: 1. No acute brain finding. Mild chronic small-vessel ischemic change of the pons and cerebral hemispheric white matter. 2. Negative intracranial MR angiography of the large and medium size vessels. 3. Left mastoid effusion. Electronically Signed   By: Paulina Fusi M.D.   On: 09/25/2022 10:40   MR ANGIO HEAD WO CONTRAST  Result Date: 09/25/2022 CLINICAL DATA:   Neuro deficit, acute, stroke suspected. Balance disturbance. EXAM: MRI HEAD WITHOUT CONTRAST MRA HEAD WITHOUT CONTRAST TECHNIQUE: Multiplanar, multi-echo pulse sequences of the brain and surrounding structures were acquired without intravenous contrast. Angiographic images of the Circle of Willis were acquired using MRA technique without intravenous contrast. COMPARISON:  Head CT 09/21/2022 FINDINGS: MRI HEAD FINDINGS Brain: Diffusion imaging does not show any acute or subacute infarction. No focal insult affects the cerebellum. Minimal chronic small-vessel ischemic change of the pons. Cerebral hemispheres show mild chronic small-vessel ischemic changes of the white matter. No cortical or large vessel territory infarction. No mass lesion, hemorrhage, hydrocephalus or extra-axial collection. Vascular: Major vessels at the base of the brain show flow. Skull and upper cervical spine: Negative Sinuses/Orbits: Paranasal sinuses are clear. Orbits are normal. There is a mastoid effusion on the left. Other: None MRA HEAD FINDINGS Anterior circulation: Both internal carotid arteries are widely patent through the skull base and siphon regions. The anterior and middle cerebral vessels are normal. No large vessel occlusion or proximal stenosis. No aneurysm or vascular malformation. Posterior circulation: Both vertebral arteries widely patent to the basilar artery. No basilar stenosis. Posterior circulation branch vessels are patent. Artifactual signal loss in the right PCA secondary to tortuosity. No true stenosis suspected. Anatomic variants: None IMPRESSION: 1. No acute brain finding. Mild chronic small-vessel ischemic change of the pons and cerebral hemispheric white matter. 2. Negative intracranial MR angiography of the large and medium size vessels. 3. Left mastoid effusion. Electronically Signed   By: Paulina Fusi M.D.   On: 09/25/2022 10:40        Scheduled Meds:  aspirin  300 mg Rectal Daily   Or   aspirin  325  mg Oral Daily   enoxaparin (LOVENOX) injection  40 mg Subcutaneous Q24H   losartan  25 mg Oral Daily   multivitamin with minerals  1 tablet Oral Daily   nicotine  21 mg Transdermal Daily   sodium chloride flush  3 mL Intravenous Q12H   Continuous Infusions:  Immune Globulin 10% Stopped (09/26/22 2146)     LOS: 2 days    Time spent:  Azucena Fallen, DO [facility]  If 7PM-7AM, please contact night-coverage [email or web address]  09/27/2022, 7:19 AM

## 2022-09-27 NOTE — Progress Notes (Signed)
NIF >-40 FVC 2.49

## 2022-09-27 NOTE — Progress Notes (Signed)
Occupational Therapy Treatment Patient Details Name: Joe Greene MRN: [id number] DOB: 08-Jul-1951 Today's Date: 09/27/2022   History of present illness Pt is a 71 y.o. M who presents 09/24/2022 with dizziness, HA, RLE weakness, numbness in his fingers. MRI negative for acute stroke. Significant PMH: none.   OT comments  Pt progressing towards OT goals this session. Min guard A for ambulation with RW for toilet transfer. Reports sensation in forearms is now tingling. Continues to be able to perform fine motor dexterity, but during functional tasks like opening containers for grooming/eating requiring assist - decreased sensation continues to impact BUE during functional tasks. Pt continues to remain motivated and benefits from skilled OT in the acute setting and afterwards at the AIR level. Good support from family. Next session bring sensory deficits/changes handout.   Recommendations for follow up therapy are one component of a multi-disciplinary discharge planning process, led by the attending physician.  Recommendations may be updated based on patient status, additional functional criteria and insurance authorization.    Follow Up Recommendations  Acute inpatient rehab (3hours/day)     Assistance Recommended at Discharge Frequent or constant Supervision/Assistance  Patient can return home with the following  A little help with walking and/or transfers;A lot of help with bathing/dressing/bathroom;Assistance with feeding   Equipment Recommendations  Other (comment) (defer to next venue of care)    Recommendations for Other Services      Precautions / Restrictions Precautions Precautions: Fall;Other (comment) Precaution Comments: impaired sensation R>L Restrictions Weight Bearing Restrictions: No       Mobility Bed Mobility               General bed mobility comments: OOB in recliner at beginning and end of session    Transfers Overall transfer level: Needs  assistance Equipment used: Rolling walker (2 wheels), None Transfers: Sit to/from Stand Sit to Stand: Min guard           General transfer comment: Min A for safety and balance     Balance Overall balance assessment: Needs assistance Sitting-balance support: Feet supported Sitting balance-Leahy Scale: Good     Standing balance support: No upper extremity supported, During functional activity Standing balance-Leahy Scale: Poor Standing balance comment: leans against sink for support                           ADL either performed or assessed with clinical judgement   ADL Overall ADL's : Needs assistance/impaired     Grooming: Moderate assistance;Oral care;Wash/dry hands;Wash/dry face;Brushing hair;Standing Grooming Details (indicate cue type and reason): Pt has ROM of motion, but due to decreased sensation required assist for opening containers,                 Toilet Transfer: Min guard;Minimal assistance;Rolling walker (2 wheels) Toilet Transfer Details (indicate cue type and reason): use of grab bars Toileting- Clothing Manipulation and Hygiene: Moderate assistance;Sit to/from stand Toileting - Clothing Manipulation Details (indicate cue type and reason): for thoroughness due to decreased sensation     Functional mobility during ADLs: Minimal assistance;Min guard;Rolling walker (2 wheels)      Extremity/Trunk Assessment Upper Extremity Assessment Upper Extremity Assessment: Generalized weakness (strength BUE 5/5, coordination WFL though difficulty holding to items due to impaired sensation; numbness and tingling)            Vision       Perception     Praxis      Cognition Arousal/Alertness: Awake/alert Behavior  During Therapy: WFL for tasks assessed/performed Overall Cognitive Status: Within Functional Limits for tasks assessed                                          Exercises      Shoulder Instructions        General Comments      Pertinent Vitals/ Pain       Pain Assessment Pain Assessment: Faces Faces Pain Scale: Hurts a little bit Pain Location: R heel Pain Descriptors / Indicators: Discomfort, Other (Comment), Sore (itching) Pain Intervention(s): Monitored during session, Repositioned  Home Living                                          Prior Functioning/Environment              Frequency  Min 2X/week        Progress Toward Goals  OT Goals(current goals can now be found in the care plan section)  Progress towards OT goals: Progressing toward goals  Acute Rehab OT Goals Patient Stated Goal: get to inpatient rehabilitation OT Goal Formulation: With patient Time For Goal Achievement: 10/09/22 Potential to Achieve Goals: Good  Plan Discharge plan remains appropriate;Frequency remains appropriate    Co-evaluation                 AM-PAC OT "6 Clicks" Daily Activity     Outcome Measure   Help from another person eating meals?: A Little Help from another person taking care of personal grooming?: A Little Help from another person toileting, which includes using toliet, bedpan, or urinal?: A Lot Help from another person bathing (including washing, rinsing, drying)?: A Lot Help from another person to put on and taking off regular upper body clothing?: A Little Help from another person to put on and taking off regular lower body clothing?: A Lot 6 Click Score: 15    End of Session Equipment Utilized During Treatment: Gait belt;Rolling walker (2 wheels)  OT Visit Diagnosis: Unsteadiness on feet (R26.81);Other abnormalities of gait and mobility (R26.89);Other (comment) (sensory deficits)   Activity Tolerance Patient tolerated treatment well   Patient Left in chair;with call bell/phone within reach;with chair alarm set   Nurse Communication Mobility status;Precautions        Time: [id number] OT Time Calculation (min): 22 min  Charges: OT  General Charges $OT Visit: 1 Visit OT Treatments $Self Care/Home Management : 8-22 mins  Jesse Sans OTR/L Acute Rehabilitation Services Office: [place] 09/27/2022, 1:54 PM

## 2022-09-27 NOTE — Progress Notes (Signed)
Neurology Progress Note   S:// Seen and examined Rash on the back - new. Not itching. Also complains of weird taste in mouth and dislike for liquids, which started with the tingling and numbness. Reports tingling now in forearms but finger dexterity is better and so is coordination on feet.  O:// Current vital signs: BP (!) 144/91 (BP Location: Right Arm)   Pulse 76   Temp 98 F (36.7 C) (Oral)   Resp 18   Ht 5\' 10"  (1.778 m)   Wt 98.4 kg   SpO2 96%   BMI 31.13 kg/m  Vital signs in last 24 hours: Temp:  [97.5 F (36.4 C)-98.3 F (36.8 C)] 98 F (36.7 C) (11/16 0312) Pulse Rate:  [72-92] 76 (11/16 0312) Resp:  [17-18] 18 (11/16 0312) BP: (133-168)/(90-109) 144/91 (11/16 0312) SpO2:  [94 %-99 %] 96 % (11/16 0312) GENERAL: Awake, alert in NAD HEENT: - Normocephalic and atraumatic, dry mm, no LN++, no Thyromegally LUNGS - Clear to auscultation bilaterally with no wheezes CV - S1S2 RRR, no m/r/g, equal pulses bilaterally. ABDOMEN - Soft, nontender, nondistended with normoactive BS Ext: warm, well perfused, intact peripheral pulses,  Skin-rash on his back - says stable since yesterday, non tender, no pus or drainage, not itchy Neurologic examination Awake alert oriented x3 No dysarthria No aphasia Cranial nerves II to XII intact Motor examination with mild flexor weakness but otherwise intact. Dexterity somewhat improved - was not able to pick up coins from a table 3 days ago, but today is able to. Sensation with stocking type decrease in the legs up to the knee.  Subjective decreased sensation in the fingertips but on objectively testing with pinprick and tuning fork, feels about the same in upper extremities. Coordination with no dysmetria DTRs: Absent  Medications  Current Facility-Administered Medications:    acetaminophen (TYLENOL) tablet 650 mg, 650 mg, Oral, Q4H PRN, 650 mg at 09/26/22 0848 **OR** acetaminophen (TYLENOL) 160 MG/5ML solution 650 mg, 650 mg, Per Tube,  Q4H PRN **OR** acetaminophen (TYLENOL) suppository 650 mg, 650 mg, Rectal, Q4H PRN, 09/28/22, MD   aspirin suppository 300 mg, 300 mg, Rectal, Daily **OR** aspirin tablet 325 mg, 325 mg, Oral, Daily, Jonah Blue, MD, 325 mg at 09/26/22 0848   diphenhydrAMINE (BENADRYL) capsule 25 mg, 25 mg, Oral, Q6H PRN, Mansy, Jan A, MD, 25 mg at 09/26/22 2241   diphenhydrAMINE (BENADRYL) injection 25 mg, 25 mg, Intravenous, Once, 2242, MD   enoxaparin (LOVENOX) injection 40 mg, 40 mg, Subcutaneous, Q24H, Milon Dikes, MD, 40 mg at 09/26/22 2118   Immune Globulin 10% (PRIVIGEN) IV infusion 35 g, 400 mg/kg (Adjusted), Intravenous, Q24 Hr x 5, 2119, MD, Stopped at 09/26/22 2146   losartan (COZAAR) tablet 25 mg, 25 mg, Oral, Daily, 2147, MD, 25 mg at 09/26/22 1837   multivitamin with minerals tablet 1 tablet, 1 tablet, Oral, Daily, 09/28/22, MD, 1 tablet at 09/26/22 1837   nicotine (NICODERM CQ - dosed in mg/24 hours) patch 21 mg, 21 mg, Transdermal, Daily, 09/28/22, MD, 21 mg at 09/26/22 0850   ondansetron (ZOFRAN) injection 4 mg, 4 mg, Intravenous, Q6H PRN, 09/28/22, MD   senna-docusate (Senokot-S) tablet 1 tablet, 1 tablet, Oral, QHS PRN, Jonah Blue, MD, 1 tablet at 09/26/22 1420   sodium chloride flush (NS) 0.9 % injection 3 mL, 3 mL, Intravenous, Q12H, 09/28/22, MD, 3 mL at 09/26/22 2220 Labs CBC    Component Value Date/Time   WBC 7.5 09/24/2022  1240   RBC 5.11 09/24/2022 1240   HGB 17.1 (H) 09/24/2022 1240   HCT 49.3 09/24/2022 1240   PLT 222 09/24/2022 1240   MCV 96.5 09/24/2022 1240   MCH 33.5 09/24/2022 1240   MCHC 34.7 09/24/2022 1240   RDW 12.9 09/24/2022 1240   LYMPHSABS 2.1 09/24/2022 1240   MONOABS 0.7 09/24/2022 1240   EOSABS 0.4 09/24/2022 1240   BASOSABS 0.1 09/24/2022 1240    CMP     Component Value Date/Time   NA 137 09/24/2022 1240   K 4.0 09/24/2022 1240   CL 104 09/24/2022 1240   CO2 23  09/24/2022 1240   GLUCOSE 172 (H) 09/24/2022 1240   BUN 22 09/24/2022 1240   CREATININE 0.98 09/24/2022 1240   CALCIUM 8.8 (L) 09/24/2022 1240   PROT 7.6 09/24/2022 1240   ALBUMIN 4.0 09/24/2022 1240   AST 28 09/24/2022 1240   ALT 45 (H) 09/24/2022 1240   ALKPHOS 50 09/24/2022 1240   BILITOT 0.6 09/24/2022 1240   GFRNONAA >60 09/24/2022 1240    Imaging I have reviewed images in epic and the results pertinent to this consultation are: MR brain negative for acute stroke  LP done-CSF with protein 89, glucose 80, RBC 0 and 91, WBC 9 and 4.   Assessment: 71 year old with no significant past medical history presenting with numbness in hands and feet that started a few days ago and worsening weakness in the lower extremities with the preceding viral illness about a few weeks ago who has lack of reflexes and and a sending type sensory loss with CSF showing albumin cytological dissociation. 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 Started on IVIG 11/14 Has a rash on the back-no itching, not spread since last 24h-not sure if IVIG related.   Impression: Guillain-Barr syndrome  Recommendations: Continue 5 days of IVIG-first dose was 09/25/2022.  Aspirin while he is on IVIG DVT prophylaxis Counseled on smoking cessation Monitor breathing carefully-with NIF and FVCs - OK to do q12 hours. If dropping, do more frequent checks. One time IV benadryl. Continue PRN PO benadryl. Monitor for spread or change. We will continue to follow D/W Dr Natale Milch  -- Milon Dikes, MD Neurologist [facility] Pager: [phone]

## 2022-09-27 NOTE — Plan of Care (Signed)
  Problem: Education: Goal: Knowledge of disease or condition will improve 09/27/2022 0622 by Angelene Giovanni, RN Outcome: Progressing IVIG administered with IVT RN as resource.  Tolerated without distress. 09/27/2022 4008 by Angelene Giovanni, RN Outcome: Progressing Goal: Knowledge of secondary prevention will improve (MUST DOCUMENT ALL) 09/27/2022 0622 by Angelene Giovanni, RN Outcome: Progressing 09/27/2022 0621 by Angelene Giovanni, RN Outcome: Progressing Goal: Knowledge of patient specific risk factors will improve Loraine Leriche N/A or DELETE if not current risk factor) Outcome: Progressing   Problem: Coping: Goal: Will verbalize positive feelings about self Outcome: Progressing   Problem: Self-Care: Goal: Ability to participate in self-care as condition permits will improve Outcome: Progressing Ambulated in hallway with 1 assist and gait belt/walker to maintain balance   Problem: Nutrition: Goal: Risk of aspiration will decrease Outcome: Progressing Tolerated hot chocolate this shift.  Reports "food/water tastes different" Goal: Dietary intake will improve Outcome: Progressing   Problem: Clinical Measurements: Goal: Diagnostic test results will improve Outcome: Progressing B/P elevated at times this shift.  Continue to monitor.   Problem: Elimination: Goal: Will not experience complications related to bowel motility Outcome: Progressing Reports of constipation and feeling bloated.  Mag Citrate given as ordered.  Passing flatus afterwards.  Mobility encouraged.

## 2022-09-27 NOTE — Progress Notes (Signed)
Negative Inspiratory Force and Vital Capacity as follows NIF <-40 VC 3.7 Liters Great effort and no signs of respiratory distress.

## 2022-09-27 NOTE — Progress Notes (Signed)
Inpatient Rehabilitation Admissions Coordinator   I met with patient and wife at bedside. He has progressed well with therapy after 2 doses of IVIG. He may progress well to be able to do outpatient therapy after 5 doses of IVIG., but we will follow his progress.  Danne Baxter, RN, MSN Rehab Admissions Coordinator [phone] 09/27/2022 12:47 PM

## 2022-09-27 NOTE — Progress Notes (Signed)
Physical Therapy Treatment Patient Details Name: Joe Greene MRN: [id number] DOB: 01-22-1951 Today's Date: 09/27/2022   History of Present Illness Pt is a 71 y.o. M who presents 09/24/2022 with dizziness, HA, RLE weakness, numbness in his fingers. MRI negative for acute stroke. Significant PMH: none.    PT Comments    Pt continues to require assist for all mobility secondary to decreased sensation and impaired balance, however pt with improved gait mechanics, stride length, and gait speed this date. Pt needing min assist to for transfers to RW with minimal anterior/posterior sway. Pt needing ~25% VC's and min assist throughout gait training with RW for stability and safety as pt with continued intermittent erratic stride length and R knee buckling. Continued to encourage OOB and ambulation with staff as tolerated. Current plan remains appropriate to address deficits and maximize functional independence and decrease caregiver burden. Pt continues to benefit from skilled PT services to progress toward functional mobility goals.    Recommendations for follow up therapy are one component of a multi-disciplinary discharge planning process, led by the attending physician.  Recommendations may be updated based on patient status, additional functional criteria and insurance authorization.  Follow Up Recommendations  Acute inpatient rehab (3hours/day)     Assistance Recommended at Discharge Frequent or constant Supervision/Assistance  Patient can return home with the following A lot of help with walking and/or transfers;Assistance with cooking/housework;Assist for transportation;Help with stairs or ramp for entrance   Equipment Recommendations  None recommended by PT (pt has RW)    Recommendations for Other Services       Precautions / Restrictions Precautions Precautions: Fall;Other (comment) Precaution Comments: impaired sensation BLE R>L Restrictions Weight Bearing Restrictions: No      Mobility  Bed Mobility Overal bed mobility: Modified Independent                  Transfers Overall transfer level: Needs assistance Equipment used: Rolling walker (2 wheels), None Transfers: Sit to/from Stand Sit to Stand: Min guard           General transfer comment: Min guard for safety, improved stability on rise, minimal A/P sway    Ambulation/Gait Ambulation/Gait assistance: Min guard Gait Distance (Feet): 100 Feet (x2) Assistive device: Rolling walker (2 wheels), None Gait Pattern/deviations: Step-through pattern, Decreased step length - left, Decreased dorsiflexion - right, Decreased weight shift to right Gait velocity: variable     General Gait Details: improved step length and heel strike initially, 25% VCs to shorten and slow stride as pt speeding up to unsafe and long stride as fatigue increasing, intermittent R knee buckling   Stairs             Wheelchair Mobility    Modified Rankin (Stroke Patients Only) Modified Rankin (Stroke Patients Only) Pre-Morbid Rankin Score: No symptoms Modified Rankin: Moderately severe disability     Balance Overall balance assessment: Needs assistance Sitting-balance support: Feet supported Sitting balance-Leahy Scale: Good     Standing balance support: No upper extremity supported, During functional activity Standing balance-Leahy Scale: Poor                              Cognition Arousal/Alertness: Awake/alert Behavior During Therapy: WFL for tasks assessed/performed Overall Cognitive Status: Within Functional Limits for tasks assessed  Exercises      General Comments        Pertinent Vitals/Pain Pain Assessment Pain Assessment: Faces Faces Pain Scale: Hurts a little bit Pain Location: R heel Pain Descriptors / Indicators: Discomfort, Other (Comment), Sore (itching) Pain Intervention(s): Monitored during session,  Limited activity within patient's tolerance    Home Living                          Prior Function            PT Goals (current goals can now be found in the care plan section) Acute Rehab PT Goals PT Goal Formulation: With patient/family Time For Goal Achievement: 10/09/22    Frequency    Min 4X/week      PT Plan      Co-evaluation              AM-PAC PT "6 Clicks" Mobility   Outcome Measure  Help needed turning from your back to your side while in a flat bed without using bedrails?: None Help needed moving from lying on your back to sitting on the side of a flat bed without using bedrails?: None Help needed moving to and from a bed to a chair (including a wheelchair)?: A Little Help needed standing up from a chair using your arms (e.g., wheelchair or bedside chair)?: A Little Help needed to walk in hospital room?: A Lot Help needed climbing 3-5 steps with a railing? : A Lot 6 Click Score: 18    End of Session Equipment Utilized During Treatment: Gait belt Activity Tolerance: Patient tolerated treatment well Patient left: with call bell/phone within reach;with family/visitor present;in chair;with nursing/sitter in room Nurse Communication: Mobility status PT Visit Diagnosis: Unsteadiness on feet (R26.81);Other abnormalities of gait and mobility (R26.89);Difficulty in walking, not elsewhere classified (R26.2)     Time: 6967-8938 PT Time Calculation (min) (ACUTE ONLY): 16 min  Charges:  $Gait Training: 8-22 mins                     Felesha Moncrieffe R. PTA Acute Rehabilitation Services Office: [phone]    Catalina Antigua 09/27/2022, 11:11 AM

## 2022-09-27 NOTE — PMR Pre-admission (Shared)
PMR Admission Coordinator Pre-Admission Assessment  Patient: Joe Greene is an 71 y.o., male MRN: [id number] DOB: 1951/05/27 Height: _0  (177.8 cm) Weight: 98.4 kg  Insurance Information HMO:     PPO:      PCP:      IPA:      80/20:   OTHER:  PRIMARY: [employer]      Policy#: [id number]      Subscriber: pt CM Name: ***      Phone#: ***     Fax#: *** Pre-Cert#: ***      Employer: *** Benefits:  Phone #: [phone]     Name: *** Eff. Date: ***     Deduct: ***      Out of Pocket Max: ***      Life Max: *** CIR: ***      SNF: *** Outpatient: ***     Co-Pay: *** Home Health: ***      Co-Pay: *** DME: ***     Co-Pay: *** Providers: ***  SECONDARY: none      Policy#:      Phone#:   Financial Counselor:       Phone#:   The "Data Collection Information Summary" for patients in Inpatient Rehabilitation Facilities with attached "Privacy Act [place] Records" was provided and verbally reviewed with: Patient and Family  Emergency Contact Information Contact Information     Name Relation Home Work Mobile   Joe Greene Spouse   [phone]      Current Medical History  Patient Admitting Diagnosis: GBS  History of Present Illness: 71 year old male without significant past medical history.  Presented  on 09/24/22 with RLE weakness and Intractable headache. His weakness began with numbness with his fingers and dizziness and high BP and periodic SOB. Saw his PCP who sent him to ER. Negative CT scan in ER and sent home. He returned to PCP and again sent to ER. [facility] ER transferred him to [facility] 11 14/23 for Neurology consultation.   MRI negative for CVA. CTA with infrarenal abdominal aortic aneurysm measuring up to 4.6 X 4.6 cm. No evidence of dissection. Recommend follow up monitor and consultation with vascular as an outpatient.   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  Began IVIG for 5 days with last dose to be 09/29/22.  Symptoms slowly improving. Noted after second dose rash on his back and itching. Not sure if IVIG related. Gave one dose Benadryl and plan for prn. Monitor for spread or change. Monitoring breathing carefully with NIF and FVCs. Asa while on IVIG. Lovenox  for DVT prophylaxis.   No previous diagnosis of HTN. Started on low dose losartan.   Patient's medical record from [facility] has been reviewed by the rehabilitation admission coordinator and physician.  Past Medical History  Past Medical History:  Diagnosis Date   Cataract    forming    Has the patient had major surgery during 100 days prior to admission? No  Family History   family history includes AAA (abdominal aortic aneurysm) in his mother; Cancer in his father; Stroke (age of onset: 12) in his mother.  Current Medications  Current Facility-Administered Medications:    acetaminophen (TYLENOL) tablet 650 mg, 650 mg, Oral, Q4H PRN, 650 mg at 09/27/22 1021 **OR** acetaminophen (TYLENOL) 160 MG/5ML solution 650 mg, 650 mg, Per Tube, Q4H PRN **OR** acetaminophen (TYLENOL) suppository 650 mg, 650 mg, Rectal, Q4H PRN, Karmen Bongo, MD   aspirin suppository 300 mg,  300 mg, Rectal, Daily **OR** aspirin tablet 325 mg, 325 mg, Oral, Daily, Karmen Bongo, MD, 325 mg at 09/27/22 1016   diphenhydrAMINE (BENADRYL) capsule 25 mg, 25 mg, Oral, Q6H PRN, Mansy, Jan A, MD, 25 mg at 09/26/22 2241   enoxaparin (LOVENOX) injection 40 mg, 40 mg, Subcutaneous, Q24H, Karmen Bongo, MD, 40 mg at 09/26/22 2118   Immune Globulin 10% (PRIVIGEN) IV infusion 35 g, 400 mg/kg (Adjusted), Intravenous, Q24 Hr x 5, Amie Portland, MD, Stopped at 09/26/22 2146   losartan (COZAAR) tablet 25 mg, 25 mg, Oral, Daily, Little Ishikawa, MD, 25 mg at 09/27/22 1016   multivitamin with minerals tablet 1 tablet, 1 tablet, Oral, Daily, Little Ishikawa, MD, 1 tablet at 09/27/22 1016   nicotine (NICODERM CQ - dosed in mg/24 hours) patch 21 mg, 21 mg,  Transdermal, Daily, Karmen Bongo, MD, 21 mg at 09/27/22 1016   ondansetron (ZOFRAN) injection 4 mg, 4 mg, Intravenous, Q6H PRN, Karmen Bongo, MD   senna-docusate (Senokot-S) tablet 1 tablet, 1 tablet, Oral, QHS PRN, Karmen Bongo, MD, 1 tablet at 09/26/22 1420   sodium chloride flush (NS) 0.9 % injection 3 mL, 3 mL, Intravenous, Q12H, Karmen Bongo, MD, 3 mL at 09/27/22 1016  Patients Current Diet:  Diet Order             Diet regular Room service appropriate? Yes with Assist; Fluid consistency: Thin  Diet effective now                  Precautions / Restrictions Precautions Precautions: Fall, Other (comment) Precaution Comments: impaired sensation R>L Restrictions Weight Bearing Restrictions: No   Has the patient had 2 or more falls or a fall with injury in the past year? No  Prior Activity Level Community (5-7x/wk): independent, working, driving  Prior Functional Level Self Care: Did the patient need help bathing, dressing, using the toilet or eating? Independent  Indoor Mobility: Did the patient need assistance with walking from room to room (with or without device)? Independent  Stairs: Did the patient need assistance with internal or external stairs (with or without device)? Independent  Functional Cognition: Did the patient need help planning regular tasks such as shopping or remembering to take medications? Independent  Patient Information Are you of Hispanic, Latino/a,or Spanish origin?: A. No, not of Hispanic, Latino/a, or Spanish origin What is your race?: A. White Do you need or want an interpreter to communicate with a doctor or health care staff?: 0. No  Patient's Response To:  Health Literacy and Transportation Is the patient able to respond to health literacy and transportation needs?: Yes Health Literacy - How often do you need to have someone help you when you read instructions, pamphlets, or other written material from your doctor or  pharmacy?: Never In the past 12 months, has lack of transportation kept you from medical appointments or from getting medications?: No In the past 12 months, has lack of transportation kept you from meetings, work, or from getting things needed for daily living?: No  Development worker, international aid / [place] Devices/Equipment: None Home Equipment: Grab bars - tub/shower, Shower seat, Conservation officer, nature (2 wheels)  Prior Device Use: Indicate devices/aids used by the patient prior to current illness, exacerbation or injury? None of the above  Current Functional Level Cognition  Overall Cognitive Status: Within Functional Limits for tasks assessed Orientation Level: Oriented X4    Extremity Assessment (includes Sensation/Coordination)  Upper Extremity Assessment: Generalized weakness (strength BUE 5/5, coordination [facility]  though difficulty holding to items due to impaired sensation; numbness and tingling) RUE Deficits / Details: strength BUE 5/5, coordination WFL though difficulty holding to items due to impaired sensation; numbness and tingling  Lower Extremity Assessment: Defer to PT evaluation RLE Deficits / Details: Strength 5/5, pt reporting numbness in feet, intact to light touch upon testing. Pt reports increased numbness in R foot compared to L RLE Coordination: decreased gross motor LLE Deficits / Details: Strength 5/5, pt reporting numbness in feet, intact to light touch upon testing    ADLs  Overall ADL's : Needs assistance/impaired Eating/Feeding: Set up Grooming: Moderate assistance, Oral care, Wash/dry hands, Wash/dry face, Brushing hair, Standing Grooming Details (indicate cue type and reason): Pt has ROM of motion, but due to decreased sensation required assist for opening containers, Upper Body Bathing: Set up, Sitting Lower Body Bathing: Moderate assistance, Sit to/from stand Upper Body Dressing : Set up, Sitting Lower Body Dressing: Moderate assistance, Sit to/from  stand Toilet Transfer: Min guard, Minimal assistance, Rolling walker (2 wheels) Toilet Transfer Details (indicate cue type and reason): use of grab bars Toileting- Clothing Manipulation and Hygiene: Moderate assistance, Sit to/from stand Toileting - Clothing Manipulation Details (indicate cue type and reason): for thoroughness due to decreased sensation Functional mobility during ADLs: Minimal assistance, Min guard, Rolling walker (2 wheels) General ADL Comments: Pt with impaired sensation in all extremities, LE > UE with noted proprioception deficits with mobility and frequent LOB/incoordinated steps. Improved with RW trial though still at high risk for falls and below active baseline    Mobility  Overal bed mobility: Modified Independent General bed mobility comments: OOB in recliner at beginning and end of session    Transfers  Overall transfer level: Needs assistance Equipment used: Rolling walker (2 wheels), None Transfers: Sit to/from Stand Sit to Stand: Min guard General transfer comment: Min A for safety and balance    Ambulation / Gait / Stairs / Wheelchair Mobility  Ambulation/Gait Ambulation/Gait assistance: Counsellor (Feet): 100 Feet (x2) Assistive device: Rolling walker (2 wheels), None Gait Pattern/deviations: Step-through pattern, Decreased step length - left, Decreased dorsiflexion - right, Decreased weight shift to right General Gait Details: improved step length and heel strike initially, 25% VCs to shorten and slow stride as pt speeding up to unsafe and long stride as fatigue increasing, intermittent R knee buckling Gait velocity: variable    Posture / Balance Static Standing Balance Single Leg Stance - Right Leg: 0 Single Leg Stance - Left Leg: 0 Balance Overall balance assessment: Needs assistance Sitting-balance support: Feet supported Sitting balance-Leahy Scale: Good Standing balance support: No upper extremity supported, During functional  activity Standing balance-Leahy Scale: Poor Standing balance comment: leans against sink for support Single Leg Stance - Right Leg: 0 Single Leg Stance - Left Leg: 0    Special needs/care consideration  Hgb A1c 6.5 New diagnosis of HTN    Previous Home Environment  Living Arrangements: Spouse/significant other  Lives With: Spouse Available Help at Discharge: Family, Available 24 hours/day Type of Home: House Home Layout: Able to live on main level with bedroom/bathroom Home Access: Stairs to enter Technical brewer of Steps: 1 Bathroom Shower/Tub: Multimedia programmer: Associate Professor Accessibility: Yes Home Care Services: No Additional Comments: DME was wife's after prior hospitalization  Discharge Living Setting Plans for Discharge Living Setting: Patient's home, Lives with (comment) (wife) Type of Home at Discharge: House Discharge Home Layout: One level, Able to live on main level with bedroom/bathroom Discharge Home  Access: Stairs to enter Entrance Stairs-Rails: None Entrance Stairs-Number of Steps: 1 Discharge Bathroom Shower/Tub: Horticulturist, commercial: Standard Discharge Bathroom Accessibility: Yes How Accessible: Accessible via walker Does the patient have any problems obtaining your medications?: No  Social/Family/Support Systems Patient Roles: Spouse Contact Information: wife, [name] Anticipated Caregiver: wife Anticipated Ambulance person Information: see contacts Ability/Limitations of Caregiver: no limitations Caregiver Availability: 24/7 Discharge Plan Discussed with Primary Caregiver: Yes Is Caregiver In Agreement with Plan?: Yes Does Caregiver/Family have Issues with Lodging/Transportation while Pt is in Rehab?: No  Goals Patient/Family Goal for Rehab: Mod I to supervision with PT and OT Expected length of stay: ELOS 5 to 7 days Pt/Family Agrees to Admission and willing to participate: Yes Program Orientation  Provided & Reviewed with Pt/Caregiver Including Roles  & Responsibilities: Yes  Decrease burden of Care through IP rehab admission: n/a  Possible need for SNF placement upon discharge: not anticipated  Patient Condition: I have reviewed medical records from [facility], spoken with CM, and patient and spouse. I met with patient at the bedside for inpatient rehabilitation assessment.  Patient will benefit from ongoing PT and OT, can actively participate in 3 hours of therapy a day 5 days of the week, and can make measurable gains during the admission.  Patient will also benefit from the coordinated team approach during an Inpatient Acute Rehabilitation admission.  The patient will receive intensive therapy as well as Rehabilitation physician, nursing, social worker, and care management interventions.  Due to bladder management, bowel management, safety, skin/wound care, disease management, medication administration, pain management, and patient education the patient requires 24 hour a day rehabilitation nursing.  The patient is currently *** with mobility and basic ADLs.  Discharge setting and therapy post discharge at home with outpatient is anticipated.  Patient has agreed to participate in the Acute Inpatient Rehabilitation Program and will admit today.  Preadmission Screen Completed By:  Danne Baxter RN MSN with updates by Julious Payer, Audelia Acton, 09/27/2022 3:20 PM ______________________________________________________________________   Discussed status with Dr. Marland Kitchen on *** at *** and received approval for admission today.  Admission Coordinator: Danne Baxter RN MSN with updates by Julious Payer, Audelia Acton, RN, time Marland KitchenSudie Grumbling ***   Assessment/Plan: Diagnosis: Does the need for close, 24 hr/day Medical supervision in concert with the patient's rehab needs make it unreasonable for this patient to be served in a less intensive setting? {yes_no_potentially:3041433} Co-Morbidities  requiring supervision/potential complications: *** Due to {due ZO:1096045}, does the patient require 24 hr/day rehab nursing? {yes_no_potentially:3041433} Does the patient require coordinated care of a physician, rehab nurse, PT, OT, and SLP to address physical and functional deficits in the context of the above medical diagnosis(es)? {yes_no_potentially:3041433} Addressing deficits in the following areas: {deficits:3041436} Can the patient actively participate in an intensive therapy program of at least 3 hrs of therapy 5 days a week? {yes_no_potentially:3041433} The potential for patient to make measurable gains while on inpatient rehab is {potential:3041437} Anticipated functional outcomes upon discharge from inpatient rehab: {functional outcomes:304600100} PT, {functional outcomes:304600100} OT, {functional outcomes:304600100} SLP Estimated rehab length of stay to reach the above functional goals is: *** Anticipated discharge destination: {anticipated dc setting:21604} 10. Overall Rehab/Functional Prognosis: {potential:3041437}   MD Signature: ***

## 2022-09-28 ENCOUNTER — Ambulatory Visit: Payer: Medicare PPO | Admitting: Family Medicine

## 2022-09-28 MED ORDER — TRAZODONE HCL 50 MG PO TABS
25.0000 mg | ORAL_TABLET | Freq: Every evening | ORAL | Status: DC | PRN
  Administered 2022-09-28 – 2022-09-29 (×2): 25 mg via ORAL
  Filled 2022-09-28 (×2): qty 1

## 2022-09-28 NOTE — Progress Notes (Signed)
Neurology Progress Note   S:// Seen and examined Stable rash on the back Reports tingling on the arms now up to the elbows Breathing-no new complaints  O:// Current vital signs: BP (!) 142/98 (BP Location: Right Arm)   Pulse 79   Temp 97.7 F (36.5 C) (Oral)   Resp 18   Ht 5\' 10"  (1.778 m)   Wt 94.7 kg   SpO2 93%   BMI 29.96 kg/m  Vital signs in last 24 hours: Temp:  [97.5 F (36.4 C)-98.3 F (36.8 C)] 97.7 F (36.5 C) (11/17 0724) Pulse Rate:  [79-98] 79 (11/17 0724) Resp:  [17-20] 18 (11/17 0724) BP: (126-151)/(82-101) 142/98 (11/17 0724) SpO2:  [91 %-100 %] 93 % (11/17 0724) Weight:  [94.7 kg] 94.7 kg (11/16 1513) GENERAL: Awake, alert in NAD HEENT: - Normocephalic and atraumatic, dry mm, no LN++, no Thyromegally LUNGS - Clear to auscultation bilaterally with no wheezes CV - S1S2 RRR, no m/r/g, equal pulses bilaterally. ABDOMEN - Soft, nontender, nondistended with normoactive BS Ext: warm, well perfused, intact peripheral pulses,  Skin-rash on his back - says stable since yesterday, non tender, no pus or drainage, not itchy Neurologic examination Awake alert oriented x3 No dysarthria No aphasia Cranial nerves II to XII intact Motor examination with mild flexor weakness but otherwise intact. Dexterity somewhat improved - was not able to pick up coins from a table 3 days ago, but today is able to. Sensation with stocking type decrease in the legs up to the knee.  Subjective decreased sensation in the fingertips but on objectively testing with pinprick and tuning fork, feels about the same in upper extremities. Coordination with no dysmetria DTRs: Absent  Exam essentially remains unchanged  Medications  Current Facility-Administered Medications:    acetaminophen (TYLENOL) tablet 650 mg, 650 mg, Oral, Q4H PRN, 650 mg at 09/28/22 0102 **OR** acetaminophen (TYLENOL) 160 MG/5ML solution 650 mg, 650 mg, Per Tube, Q4H PRN **OR** acetaminophen (TYLENOL) suppository 650  mg, 650 mg, Rectal, Q4H PRN, Karmen Bongo, MD   aspirin suppository 300 mg, 300 mg, Rectal, Daily **OR** aspirin tablet 325 mg, 325 mg, Oral, Daily, Karmen Bongo, MD, 325 mg at 09/28/22 [id number]   diphenhydrAMINE (BENADRYL) capsule 25 mg, 25 mg, Oral, Q6H PRN, Mansy, Jan A, MD, 25 mg at 09/26/22 2241   enoxaparin (LOVENOX) injection 40 mg, 40 mg, Subcutaneous, Q24H, Karmen Bongo, MD, 40 mg at 09/27/22 2013   Immune Globulin 10% (PRIVIGEN) IV infusion 35 g, 400 mg/kg (Adjusted), Intravenous, Q24 Hr x 5, Amie Portland, MD, Last Rate: 240 mL/hr at 09/27/22 1911, Rate Change at 09/27/22 1911   losartan (COZAAR) tablet 25 mg, 25 mg, Oral, Daily, Little Ishikawa, MD, 25 mg at 09/28/22 [id number]   multivitamin with minerals tablet 1 tablet, 1 tablet, Oral, Daily, Little Ishikawa, MD, 1 tablet at 09/28/22 [id number]   nicotine (NICODERM CQ - dosed in mg/24 hours) patch 21 mg, 21 mg, Transdermal, Daily, Karmen Bongo, MD, 21 mg at 09/28/22 0927   ondansetron (ZOFRAN) injection 4 mg, 4 mg, Intravenous, Q6H PRN, Karmen Bongo, MD   senna-docusate (Senokot-S) tablet 1 tablet, 1 tablet, Oral, QHS PRN, Karmen Bongo, MD, 1 tablet at 09/26/22 1420   sodium chloride flush (NS) 0.9 % injection 3 mL, 3 mL, Intravenous, Q12H, Karmen Bongo, MD, 3 mL at 09/27/22 1016 Labs CBC    Component Value Date/Time   WBC 7.5 09/24/2022 1240   RBC 5.11 09/24/2022 1240   HGB 17.1 (H) 09/24/2022 1240   HCT  49.3 09/24/2022 1240   PLT 222 09/24/2022 1240   MCV 96.5 09/24/2022 1240   MCH 33.5 09/24/2022 1240   MCHC 34.7 09/24/2022 1240   RDW 12.9 09/24/2022 1240   LYMPHSABS 2.1 09/24/2022 1240   MONOABS 0.7 09/24/2022 1240   EOSABS 0.4 09/24/2022 1240   BASOSABS 0.1 09/24/2022 1240    CMP     Component Value Date/Time   NA 137 09/24/2022 1240   K 4.0 09/24/2022 1240   CL 104 09/24/2022 1240   CO2 23 09/24/2022 1240   GLUCOSE 172 (H) 09/24/2022 1240   BUN 22 09/24/2022 1240   CREATININE 0.98 09/24/2022  1240   CALCIUM 8.8 (L) 09/24/2022 1240   PROT 7.6 09/24/2022 1240   ALBUMIN 4.0 09/24/2022 1240   AST 28 09/24/2022 1240   ALT 45 (H) 09/24/2022 1240   ALKPHOS 50 09/24/2022 1240   BILITOT 0.6 09/24/2022 1240   GFRNONAA >60 09/24/2022 1240    Imaging I have reviewed images in epic and the results pertinent to this consultation are: MR brain negative for acute stroke  LP done-CSF with protein 89, glucose 80, RBC 0 and 91, WBC 9 and 4.   Assessment: 71 year old with no significant past medical history presenting with numbness in hands and feet that started a few days ago and worsening weakness in the lower extremities with the preceding viral illness about a few weeks ago who has lack of reflexes and and a sending type sensory loss with CSF showing albumin cytological dissociation. 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 Started on IVIG 11/14 Has a rash on the back-no itching, not spread since last 24h-not sure if IVIG related.   Impression: Guillain-Barr syndrome  Recommendations: Continue 5 days of IVIG-first dose was 09/25/2022.  Aspirin while he is on IVIG DVT prophylaxis Counseled on smoking cessation Monitor breathing carefully-with NIF and FVCs - OK to do q12 hours. If dropping, do more frequent checks. We will continue to follow D/W Dr Natale Milch  -- Milon Dikes, MD Neurologist [facility]

## 2022-09-28 NOTE — TOC Progression Note (Signed)
Transition of Care [facility]) - Progression Note    Patient Details  Name: Joe Greene MRN: [id number] Date of Birth: 13-May-1951  Transition of Care [facility]) CM/SW Contact  Kermit Balo, RN Phone Number: 09/28/2022, 11:01 AM  Clinical Narrative:    Pt to finish IVIG tomorrow. CIR to eval. TOC following.  Expected Discharge Plan: IP Rehab Facility Barriers to Discharge: Continued Medical Work up  Expected Discharge Plan and Services Expected Discharge Plan: IP Rehab Facility   Discharge Planning Services: CM Consult Post Acute Care Choice: IP Rehab Living arrangements for the past 2 months: Single Family Home                                       Social Determinants of Health (SDOH) Interventions    Readmission Risk Interventions     No data to display

## 2022-09-28 NOTE — Progress Notes (Signed)
The patient is requesting for  something to help him sleep. Notified Dr. Arville Care.

## 2022-09-28 NOTE — Progress Notes (Signed)
Negative Inspiratory Force and Vital Capacity as follows: NIF: >-40 CmH2O VC: 3.08 Liters Pt performed with great effort

## 2022-09-28 NOTE — Progress Notes (Signed)
Physical Therapy Treatment Patient Details Name: Joe Greene MRN: [id number] DOB: 29-Jun-1951 Today's Date: 09/28/2022   History of Present Illness Pt is a 71 y.o. M who presents 09/24/2022 with dizziness, HA, RLE weakness, numbness in his fingers. MRI negative for acute stroke. Significant PMH: none.    PT Comments    Pt with increased fatigue and increased tingling sensation of BUE this date limiting progress towards acute goals. Pt with continued good effort and motivation demonstrating gait with RW and min guard for safety with improvement in stride length and motor control of BLE able to achieve soft heel strike, however pt with noted 3/4 DOE. Pt stating he was "wiped out" after PT session yesterday, encouraged and educated pt on increasing activity tolerance slowly with multiple shorter bouts of activity with pt and spouse verbalizing understanding. Current plan remains appropriate to address deficits and maximize functional independence and decrease caregiver burden. Pt continues to benefit from skilled PT services to progress toward functional mobility goals.    Recommendations for follow up therapy are one component of a multi-disciplinary discharge planning process, led by the attending physician.  Recommendations may be updated based on patient status, additional functional criteria and insurance authorization.  Follow Up Recommendations  Acute inpatient rehab (3hours/day)     Assistance Recommended at Discharge Frequent or constant Supervision/Assistance  Patient can return home with the following A lot of help with walking and/or transfers;Assistance with cooking/housework;Assist for transportation;Help with stairs or ramp for entrance   Equipment Recommendations  None recommended by PT (pt has RW)    Recommendations for Other Services       Precautions / Restrictions Precautions Precautions: Fall;Other (comment) Precaution Comments: impaired sensation BLE  R>L Restrictions Weight Bearing Restrictions: No     Mobility  Bed Mobility Overal bed mobility: Modified Independent             General bed mobility comments: OOB in recliner pre and post session    Transfers Overall transfer level: Needs assistance Equipment used: Rolling walker (2 wheels), None Transfers: Sit to/from Stand Sit to Stand: Min assist           General transfer comment: light min assist to power up and steady on rise    Ambulation/Gait Ambulation/Gait assistance: Min guard Gait Distance (Feet): 50 Feet (x2 with standing rest) Assistive device: Rolling walker (2 wheels), None Gait Pattern/deviations: Step-through pattern, Decreased step length - left, Decreased dorsiflexion - right, Decreased weight shift to right Gait velocity: variable     General Gait Details: continued improvement with stride length and LE motor control,  distance limited secondary to increased fatigue and 3/4 DOE   Social research officer, government Rankin (Stroke Patients Only) Modified Rankin (Stroke Patients Only) Pre-Morbid Rankin Score: No symptoms Modified Rankin: Moderately severe disability     Balance Overall balance assessment: Needs assistance Sitting-balance support: Feet supported Sitting balance-Leahy Scale: Good     Standing balance support: No upper extremity supported, During functional activity Standing balance-Leahy Scale: Poor Standing balance comment: heavy reliance UE support                            Cognition Arousal/Alertness: Awake/alert Behavior During Therapy: WFL for tasks assessed/performed Overall Cognitive Status: Within Functional Limits for tasks assessed  Exercises General Exercises - Lower Extremity Long Arc Quad: AROM, Right, Left, 20 reps, Seated (with concentration on eccentric control) Heel Raises: AROM, Right, Left, 10 reps,  Seated Mini-Sqauts: AROM, Right, Left, 10 reps, Seated    General Comments General comments (skin integrity, edema, etc.): pt with increased tingling in BUEs this date, pt stating he was "wiped out" after PT session yesterday, encouraged and educated pt on increasing activity tolerance slowly with multiple shorter bouts of activity vs one long bout      Pertinent Vitals/Pain Pain Assessment Pain Assessment: Faces Faces Pain Scale: No hurt Pain Intervention(s): Monitored during session    Home Living                          Prior Function            PT Goals (current goals can now be found in the care plan section) Acute Rehab PT Goals PT Goal Formulation: With patient/family Time For Goal Achievement: 10/09/22    Frequency    Min 4X/week      PT Plan      Co-evaluation              AM-PAC PT "6 Clicks" Mobility   Outcome Measure  Help needed turning from your back to your side while in a flat bed without using bedrails?: None Help needed moving from lying on your back to sitting on the side of a flat bed without using bedrails?: None Help needed moving to and from a bed to a chair (including a wheelchair)?: A Little Help needed standing up from a chair using your arms (e.g., wheelchair or bedside chair)?: A Little Help needed to walk in hospital room?: A Lot Help needed climbing 3-5 steps with a railing? : A Lot 6 Click Score: 18    End of Session Equipment Utilized During Treatment: Gait belt Activity Tolerance: Patient tolerated treatment well;Patient limited by fatigue Patient left: with call bell/phone within reach;with family/visitor present;in chair;with nursing/sitter in room Nurse Communication: Mobility status PT Visit Diagnosis: Unsteadiness on feet (R26.81);Other abnormalities of gait and mobility (R26.89);Difficulty in walking, not elsewhere classified (R26.2)     Time: 3762-8315 PT Time Calculation (min) (ACUTE ONLY): 20  min  Charges:  $Therapeutic Exercise: 8-22 mins                     Ranger Petrich R. PTA Acute Rehabilitation Services Office: [phone]    Catalina Antigua 09/28/2022, 9:43 AM

## 2022-09-28 NOTE — Progress Notes (Signed)
Mobility Specialist: Progress Note   09/28/22 1401  Mobility  Activity Ambulated with assistance in hallway  Level of Assistance Contact guard assist, steadying assist  Assistive Device Front wheel walker  Distance Ambulated (ft) 200 ft (100'x2)  Activity Response Tolerated well  Mobility Referral Yes  $Mobility charge 1 Mobility   Post-Mobility: 90 HR, 96% SpO2  Pt received in the chair and agreeable to mobility. C/o bilateral hand numbness and BLE weakness which pt attributes to not getting much sleep last night. Mild drift L/R as well as cues to decrease stride length to improve balance. R knee buckling x1. Pt back to the chair after session with call bell in reach.   Keymani Glynn Mobility Specialist Please contact via SecureChat or Rehab office at [phone]

## 2022-09-28 NOTE — Progress Notes (Signed)
Mobility Specialist: Progress Note   09/28/22 1611  Mobility  Activity Ambulated with assistance in hallway  Level of Assistance Minimal assist, patient does 75% or more  Assistive Device Front wheel walker  Distance Ambulated (ft) 200 ft (100'+50'+50')  Activity Response Tolerated well  Mobility Referral Yes  $Mobility charge 1 Mobility   Pt received in the chair and agreeable to mobility. Contact guard to stand as well as during ambulation. LOB x1 when navigating around the end of the bed requiring minA to correct. Cues to decrease stride length to improve balance. Stopped x2 for standing breaks to reset when turning. Pt back to the chair after session with call bell at his side.   Ples Trudel Mobility Specialist Please contact via SecureChat or Rehab office at [phone]

## 2022-09-28 NOTE — Care Management Important Message (Signed)
Important Message  Patient Details  Name: Joe Greene MRN: [id number] Date of Birth: 06-Dec-1950   Medicare Important Message Given:  Yes  Patient left prior to IM delivery will mail to the patient home address.    Shanora Christensen 09/28/2022, 1:37 PM

## 2022-09-28 NOTE — Progress Notes (Signed)
PROGRESS NOTE    Joe Greene  HUT:[id number] DOB: October 31, 1951 DOA: 09/24/2022 PCP: Loyola Mast, MD   Brief Narrative:  Joe Greene is a 71 y.o. male without significant past medical history presenting with RLE weakness.  He was seen in the ER on 11/10 for intractable headache (negative head CT) and in teleconsultation yesterday and reported 3 weeks ago he started with an intractable heache.  Patient began having ascending numbness in his fingers and toes subsequently involving his feet and hands.  He reports profoundly elevated blood pressure at home as well.  Admitted to hospital service, neurology consulted  Assessment & Plan:   Principal Problem:   Stroke-like symptoms Active Problems:   Tobacco use   Hypertriglyceridemia   AAA (abdominal aortic aneurysm) (HCC)   Elevated blood-pressure reading without diagnosis of hypertension   Pre-diabetes   Bilateral parasthesias Rule out Guillain Barre -MRI negative -LP performed 14th - results pending -Neuro follownig -continue IVIG x5 days -last dose 09/29/2022 -Symptoms somewhat labile over the last 24 hours, worsening paresthesias today   Pre-diabetes -Mildly elevated glucose, A1c 6.5 -Nutrition consult requested   Elevated blood-pressure reading without diagnosis of hypertension -No previous diagnosis of HTN -Continue low-dose losartan 25 mg daily  AAA (abdominal aortic aneurysm) (HCC) -CTA ordered due to concern raised on L-sine X-ray -Infrarenal abdominal aortic aneurysm measuring up to 4.6 x 4.6 cm.  -No evidence of dissection -Recommend follow-up CT/MR every 6 months and vascular consultation as an outpatient   Hypertriglyceridemia -This has been uptrending -Needs medication - fish oil vs. Niacin -Will defer to PCP -Has appt with PCP scheduled for next week   Tobacco use -Patch provided   DVT prophylaxis: Lovenox Code Status: Full Family Communication: Wife at bedside  Status is: Inpatient  Dispo: The  patient is from: Home              Anticipated d/c is to: To be determined              Anticipated d/c date is: 24 to 48 hours              Patient currently not medically stable for discharge  Consultants:  Neurology  Procedures:  LP  Antimicrobials:  None  Subjective: No acute issues or events overnight denies nausea vomiting diarrhea constipation headache fevers chills or chest pain.  Worsening paresthesias yesterday afternoon into this morning  Objective: Vitals:   09/27/22 2000 09/27/22 2315 09/28/22 0308 09/28/22 0724  BP: (!) 146/94 128/83 (!) 143/90 (!) 142/98  Pulse: 87 85 85 79  Resp: 18  17 18   Temp: 98.3 F (36.8 C) 97.9 F (36.6 C) 98.1 F (36.7 C) 97.7 F (36.5 C)  TempSrc: Oral Oral Oral Oral  SpO2: 97% 91% 92% 93%  Weight:      Height:        Intake/Output Summary (Last 24 hours) at 09/28/2022 0726 Last data filed at 09/27/2022 0800 Gross per 24 hour  Intake 480 ml  Output --  Net 480 ml    Filed Weights   09/24/22 1207 09/25/22 2134 09/27/22 1513  Weight: 96.2 kg 98.4 kg 94.7 kg    Examination:  General:  Pleasantly resting in bed, No acute distress. HEENT:  Normocephalic atraumatic.  Sclerae nonicteric, noninjected.  Extraocular movements intact bilaterally. Neck:  Without mass or deformity.  Trachea is midline. Lungs:  Clear to auscultate bilaterally without rhonchi, wheeze, or rales. Heart:  Regular rate and rhythm.  Without murmurs, rubs, or gallops.  Abdomen:  Soft, nontender, nondistended.  Without guarding or rebound. Extremities: Without cyanosis, clubbing, edema, or obvious deformity.  Decreased sensation distal fingertips and feet. Vascular:  Dorsalis pedis and posterior tibial pulses palpable bilaterally. Skin:  Warm and dry, no erythema, no ulcerations.   Data Reviewed: I have personally reviewed following labs and imaging studies  CBC: Recent Labs  Lab 09/21/22 1705 09/24/22 1240  WBC 7.2 7.5  NEUTROABS  --  4.2  HGB  16.3 17.1*  HCT 46.9 49.3  MCV 96.7 96.5  PLT 235 222    Basic Metabolic Panel: Recent Labs  Lab 09/21/22 1705 09/24/22 1240  NA 136 137  K 4.0 4.0  CL 104 104  CO2 22 23  GLUCOSE 131* 172*  BUN 19 22  CREATININE 1.02 0.98  CALCIUM 8.9 8.8*  MG  --  2.1    GFR: Estimated Creatinine Clearance: 79.9 mL/min (by C-G formula based on SCr of 0.98 mg/dL). Liver Function Tests: Recent Labs  Lab 09/24/22 1240  AST 28  ALT 45*  ALKPHOS 50  BILITOT 0.6  PROT 7.6  ALBUMIN 4.0    Recent Labs  Lab 09/24/22 1240  LIPASE 41    No results for input(s): "AMMONIA" in the last 168 hours. Coagulation Profile: No results for input(s): "INR", "PROTIME" in the last 168 hours. Cardiac Enzymes: No results for input(s): "CKTOTAL", "CKMB", "CKMBINDEX", "TROPONINI" in the last 168 hours. BNP (last 3 results) Recent Labs    09/24/22 0840  PROBNP 12.0    HbA1C: No results for input(s): "HGBA1C" in the last 72 hours.  CBG: No results for input(s): "GLUCAP" in the last 168 hours. Lipid Profile: No results for input(s): "CHOL", "HDL", "LDLCALC", "TRIG", "CHOLHDL", "LDLDIRECT" in the last 72 hours.  Thyroid Function Tests: No results for input(s): "TSH", "T4TOTAL", "FREET4", "T3FREE", "THYROIDAB" in the last 72 hours.  Anemia Panel: No results for input(s): "VITAMINB12", "FOLATE", "FERRITIN", "TIBC", "IRON", "RETICCTPCT" in the last 72 hours.  Sepsis Labs: No results for input(s): "PROCALCITON", "LATICACIDVEN" in the last 168 hours.  Recent Results (from the past 240 hour(s))  Gram stain     Status: None   Collection Time: 09/25/22 12:54 PM   Specimen: CSF; Cerebrospinal Fluid  Result Value Ref Range Status   Specimen Description CSF  Final   Special Requests NONE  Final   Gram Stain   Final    WBC PRESENT, PREDOMINANTLY MONONUCLEAR NO ORGANISMS SEEN CYTOSPIN SMEAR Performed at [facility], [address] [place], [place] [address]    Report Status 09/25/2022  FINAL  Final         Radiology Studies: No results found.      Scheduled Meds:  aspirin  300 mg Rectal Daily   Or   aspirin  325 mg Oral Daily   enoxaparin (LOVENOX) injection  40 mg Subcutaneous Q24H   losartan  25 mg Oral Daily   multivitamin with minerals  1 tablet Oral Daily   nicotine  21 mg Transdermal Daily   sodium chloride flush  3 mL Intravenous Q12H   Continuous Infusions:  Immune Globulin 10% 240 mL/hr at 09/27/22 1911     LOS: 3 days    Time spent:  Azucena Fallen, DO [facility]  If 7PM-7AM, please contact night-coverage [email or web address]  09/28/2022, 7:26 AM

## 2022-09-29 MED ORDER — GABAPENTIN 100 MG PO CAPS
100.0000 mg | ORAL_CAPSULE | Freq: Three times a day (TID) | ORAL | Status: DC
  Administered 2022-09-29 – 2022-10-01 (×7): 100 mg via ORAL
  Filled 2022-09-29 (×7): qty 1

## 2022-09-29 NOTE — Progress Notes (Signed)
Patient did NIF and FVC with great effort. NIF: >-40 FVC: 2.7L

## 2022-09-29 NOTE — Progress Notes (Signed)
Neurology Progress Note   S:// Seen and examined Stable rash on the back Somewhat better in terms of sensation in the legs.  Also able to pick up pennies and coins from the table-hand dexterity better  O:// Current vital signs: BP 128/79 (BP Location: Right Arm)   Pulse 73   Temp 98 F (36.7 C) (Oral)   Resp 20   Ht 5\' 10"  (1.778 m)   Wt 94.7 kg   SpO2 94%   BMI 29.96 kg/m  Vital signs in last 24 hours: Temp:  [97.6 F (36.4 C)-98.8 F (37.1 C)] 98 F (36.7 C) (11/18 0737) Pulse Rate:  [73-91] 73 (11/18 0737) Resp:  [16-20] 20 (11/18 0737) BP: (124-162)/(71-103) 128/79 (11/18 0737) SpO2:  [94 %-100 %] 94 % (11/18 0737) GENERAL: Awake, alert in NAD HEENT: - Normocephalic and atraumatic, dry mm, no LN++, no Thyromegally LUNGS - Clear to auscultation bilaterally with no wheezes CV - S1S2 RRR, no m/r/g, equal pulses bilaterally. ABDOMEN - Soft, nontender, nondistended with normoactive BS Ext: warm, well perfused, intact peripheral pulses,  Skin-rash on his back - says stable since yesterday, non tender, no pus or drainage, not itchy Neurologic examination Awake alert oriented x3 No dysarthria No aphasia Cranial nerves II to XII intact Motor examination with mild flexor weakness but otherwise intact. Dexterity somewhat improved - was not able to pick up coins from a table 3 days ago-but continues to be able to for the last 2 days. Sensation with stocking type decrease in the legs up to the knee.  Subjective decreased sensation in the fingertips but on objectively testing with pinprick and tuning fork, feels about the same in upper extremities.  Sensation to vibration is diminished in the lower extremities up to the knees.  Sensation to vibration is mildly diminished on the fingertips but decently present on the dorsal surface of the hand and above although subjectively still feels tingling and numbness. Coordination with no dysmetria DTRs: Absent all  over   Medications  Current Facility-Administered Medications:    acetaminophen (TYLENOL) tablet 650 mg, 650 mg, Oral, Q4H PRN, 650 mg at 09/29/22 0200 **OR** acetaminophen (TYLENOL) 160 MG/5ML solution 650 mg, 650 mg, Per Tube, Q4H PRN **OR** acetaminophen (TYLENOL) suppository 650 mg, 650 mg, Rectal, Q4H PRN, 10/01/22, MD   aspirin suppository 300 mg, 300 mg, Rectal, Daily **OR** aspirin tablet 325 mg, 325 mg, Oral, Daily, Jonah Blue, MD, 325 mg at 09/28/22 09/30/22   diphenhydrAMINE (BENADRYL) capsule 25 mg, 25 mg, Oral, Q6H PRN, Mansy, Jan A, MD, 25 mg at 09/28/22 2110   enoxaparin (LOVENOX) injection 40 mg, 40 mg, Subcutaneous, Q24H, 2111, MD, 40 mg at 09/28/22 2108   gabapentin (NEURONTIN) capsule 100 mg, 100 mg, Oral, TID, 2109, MD   Immune Globulin 10% (PRIVIGEN) IV infusion 35 g, 400 mg/kg (Adjusted), Intravenous, Q24 Hr x 5, Azucena Fallen, MD, Last Rate: 240 mL/hr at 09/27/22 1911, 35 g at 09/28/22 1711   losartan (COZAAR) tablet 25 mg, 25 mg, Oral, Daily, 09/30/22, MD, 25 mg at 09/28/22 09/30/22   multivitamin with minerals tablet 1 tablet, 1 tablet, Oral, Daily, 2060, MD, 1 tablet at 09/28/22 09/30/22   nicotine (NICODERM CQ - dosed in mg/24 hours) patch 21 mg, 21 mg, Transdermal, Daily, 1561, MD, 21 mg at 09/28/22 0927   ondansetron (ZOFRAN) injection 4 mg, 4 mg, Intravenous, Q6H PRN, 09/30/22, MD   senna-docusate (Senokot-S) tablet 1 tablet, 1 tablet, Oral, QHS  PRN, Jonah Blue, MD, 1 tablet at 09/28/22 1330   sodium chloride flush (NS) 0.9 % injection 3 mL, 3 mL, Intravenous, Q12H, Jonah Blue, MD, 3 mL at 09/28/22 2030   traZODone (DESYREL) tablet 25 mg, 25 mg, Oral, QHS PRN, Mansy, Jan A, MD, 25 mg at 09/28/22 2108 Labs CBC    Component Value Date/Time   WBC 7.5 09/24/2022 1240   RBC 5.11 09/24/2022 1240   HGB 17.1 (H) 09/24/2022 1240   HCT 49.3 09/24/2022 1240   PLT 222 09/24/2022 1240   MCV  96.5 09/24/2022 1240   MCH 33.5 09/24/2022 1240   MCHC 34.7 09/24/2022 1240   RDW 12.9 09/24/2022 1240   LYMPHSABS 2.1 09/24/2022 1240   MONOABS 0.7 09/24/2022 1240   EOSABS 0.4 09/24/2022 1240   BASOSABS 0.1 09/24/2022 1240    CMP     Component Value Date/Time   NA 137 09/24/2022 1240   K 4.0 09/24/2022 1240   CL 104 09/24/2022 1240   CO2 23 09/24/2022 1240   GLUCOSE 172 (H) 09/24/2022 1240   BUN 22 09/24/2022 1240   CREATININE 0.98 09/24/2022 1240   CALCIUM 8.8 (L) 09/24/2022 1240   PROT 7.6 09/24/2022 1240   ALBUMIN 4.0 09/24/2022 1240   AST 28 09/24/2022 1240   ALT 45 (H) 09/24/2022 1240   ALKPHOS 50 09/24/2022 1240   BILITOT 0.6 09/24/2022 1240   GFRNONAA >60 09/24/2022 1240    Imaging I have reviewed images in epic and the results pertinent to this consultation are: MR brain negative for acute stroke  LP done-CSF with protein 89, glucose 80, RBC 0 and 91, WBC 9 and 4.   Assessment: 71 year old with no significant past medical history presenting with numbness in hands and feet that started a few days ago and worsening weakness in the lower extremities with the preceding viral illness about a few weeks ago who has lack of reflexes and and a sending type sensory loss with CSF showing albumin cytological dissociation. 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 Started on IVIG 11/14-last dose today at 5 PM. Has a rash on the back-no itching, not spread since last 2 to 3 days-unclear if it is IVIG related.  Has gotten Benadryl that has helped.   Impression: Guillain-Barr syndrome  Recommendations: Complete 5 days of IVIG-this evening last dose. Aspirin while he is on IVIG DVT prophylaxis Counseled on smoking cessation Monitor breathing carefully-with NIF and FVCs - OK to do q12 hours. If dropping, do more frequent checks. We will continue to follow PT OT D/W Dr Natale Milch  -- Milon Dikes, MD Neurologist [facility]

## 2022-09-29 NOTE — Progress Notes (Signed)
Mobility Specialist Progress Note:   09/29/22 1407  Mobility  Activity Ambulated with assistance in hallway  Level of Assistance Contact guard assist, steadying assist  Assistive Device Front wheel walker  Distance Ambulated (ft) 300 ft  Activity Response Tolerated well  Mobility Referral Yes  $Mobility charge 1 Mobility   Pt received in chair and agreeable. No complaints. Pt left in chair with all needs met, call bell in reach, and family in room.   Andrey Campanile Mobility Specialist Please contact via SecureChat or  Rehab office at [phone]

## 2022-09-29 NOTE — Progress Notes (Signed)
PROGRESS NOTE    Joe Greene  RCV:[id number] DOB: 07/05/1951 DOA: 09/24/2022 PCP: Loyola Mast, MD   Brief Narrative:  Joe Greene is a 71 y.o. male without significant past medical history presenting with RLE weakness.  He was seen in the ER on 11/10 for intractable headache (negative head CT) and in teleconsultation yesterday and reported 3 weeks ago he started with an intractable heache.  Patient began having ascending numbness in his fingers and toes subsequently involving his feet and hands.  He reports profoundly elevated blood pressure at home as well.  Admitted to hospital service, neurology consulted  Assessment & Plan:   Principal Problem:   Stroke-like symptoms Active Problems:   Tobacco use   Hypertriglyceridemia   AAA (abdominal aortic aneurysm) (HCC)   Elevated blood-pressure reading without diagnosis of hypertension   Pre-diabetes   Bilateral parasthesias Rule out Guillain Barre -MRI negative -LP performed 14th - results pending -Neuro follownig -continue IVIG x5 days -last dose 09/29/2022 -Ongoing symptoms - may require repeat dosing per patient/family at bedside - will confirm with Neuro -Gabapentin low dose initiated for neuropathic pain  Pre-diabetes -Mildly elevated glucose, A1c 6.5 -Nutrition consult requested   Elevated blood-pressure reading without diagnosis of hypertension -No previous diagnosis of HTN -Continue low-dose losartan 25 mg daily  AAA (abdominal aortic aneurysm) (HCC) -CTA ordered due to concern raised on L-sine X-ray -Infrarenal abdominal aortic aneurysm measuring up to 4.6 x 4.6 cm.  -No evidence of dissection -Recommend follow-up CT/MR every 6 months and vascular consultation as an outpatient   Hypertriglyceridemia -This has been uptrending -Needs medication - fish oil vs. Niacin -Will defer to PCP -Has appt with PCP scheduled for next week   Tobacco use -Patch provided   DVT prophylaxis: Lovenox Code Status: Full Family  Communication: Wife at bedside  Status is: Inpatient  Dispo: The patient is from: Home              Anticipated d/c is to: To be determined              Anticipated d/c date is: 24 to 48 hours              Patient currently not medically stable for discharge  Consultants:  Neurology  Procedures:  LP  Antimicrobials:  None  Subjective: No acute issues or events overnight denies nausea vomiting diarrhea constipation headache fevers chills or chest pain.  Worsening paresthesias yesterday afternoon into this morning  Objective: Vitals:   09/28/22 2029 09/28/22 2110 09/28/22 2340 09/29/22 0538  BP: (!) 147/94 (!) 148/99 124/71 132/82  Pulse: 89  89 73  Resp: 16 18 17 17   Temp: 98.2 F (36.8 C) 98 F (36.7 C) 98.8 F (37.1 C) 97.9 F (36.6 C)  TempSrc: Oral Oral Oral Oral  SpO2: 95% 96%  95%  Weight:      Height:       No intake or output data in the 24 hours ending 09/29/22 0737  Filed Weights   09/24/22 1207 09/25/22 2134 09/27/22 1513  Weight: 96.2 kg 98.4 kg 94.7 kg    Examination:  General:  Pleasantly resting in bed, No acute distress. HEENT:  Normocephalic atraumatic.  Sclerae nonicteric, noninjected.  Extraocular movements intact bilaterally. Neck:  Without mass or deformity.  Trachea is midline. Lungs:  Clear to auscultate bilaterally without rhonchi, wheeze, or rales. Heart:  Regular rate and rhythm.  Without murmurs, rubs, or gallops. Abdomen:  Soft, nontender, nondistended.  Without guarding or rebound.  Extremities: Without cyanosis, clubbing, edema, or obvious deformity.  Decreased sensation distal fingertips and feet. Vascular:  Dorsalis pedis and posterior tibial pulses palpable bilaterally. Skin:  Warm and dry, no erythema, no ulcerations.   Data Reviewed: I have personally reviewed following labs and imaging studies  CBC: Recent Labs  Lab 09/24/22 1240  WBC 7.5  NEUTROABS 4.2  HGB 17.1*  HCT 49.3  MCV 96.5  PLT 222    Basic Metabolic  Panel: Recent Labs  Lab 09/24/22 1240  NA 137  K 4.0  CL 104  CO2 23  GLUCOSE 172*  BUN 22  CREATININE 0.98  CALCIUM 8.8*  MG 2.1    GFR: Estimated Creatinine Clearance: 79.9 mL/min (by C-G formula based on SCr of 0.98 mg/dL). Liver Function Tests: Recent Labs  Lab 09/24/22 1240  AST 28  ALT 45*  ALKPHOS 50  BILITOT 0.6  PROT 7.6  ALBUMIN 4.0    Recent Labs  Lab 09/24/22 1240  LIPASE 41    No results for input(s): "AMMONIA" in the last 168 hours. Coagulation Profile: No results for input(s): "INR", "PROTIME" in the last 168 hours. Cardiac Enzymes: No results for input(s): "CKTOTAL", "CKMB", "CKMBINDEX", "TROPONINI" in the last 168 hours. BNP (last 3 results) Recent Labs    09/24/22 0840  PROBNP 12.0    HbA1C: No results for input(s): "HGBA1C" in the last 72 hours.  CBG: No results for input(s): "GLUCAP" in the last 168 hours. Lipid Profile: No results for input(s): "CHOL", "HDL", "LDLCALC", "TRIG", "CHOLHDL", "LDLDIRECT" in the last 72 hours.  Thyroid Function Tests: No results for input(s): "TSH", "T4TOTAL", "FREET4", "T3FREE", "THYROIDAB" in the last 72 hours.  Anemia Panel: No results for input(s): "VITAMINB12", "FOLATE", "FERRITIN", "TIBC", "IRON", "RETICCTPCT" in the last 72 hours.  Sepsis Labs: No results for input(s): "PROCALCITON", "LATICACIDVEN" in the last 168 hours.  Recent Results (from the past 240 hour(s))  Gram stain     Status: None   Collection Time: 09/25/22 12:54 PM   Specimen: CSF; Cerebrospinal Fluid  Result Value Ref Range Status   Specimen Description CSF  Final   Special Requests NONE  Final   Gram Stain   Final    WBC PRESENT, PREDOMINANTLY MONONUCLEAR NO ORGANISMS SEEN CYTOSPIN SMEAR Performed at [facility], [address] [place], [place] [address]    Report Status 09/25/2022 FINAL  Final         Radiology Studies: No results found.      Scheduled Meds:  aspirin  300 mg Rectal Daily   Or    aspirin  325 mg Oral Daily   enoxaparin (LOVENOX) injection  40 mg Subcutaneous Q24H   losartan  25 mg Oral Daily   multivitamin with minerals  1 tablet Oral Daily   nicotine  21 mg Transdermal Daily   sodium chloride flush  3 mL Intravenous Q12H   Continuous Infusions:  Immune Globulin 10% 240 mL/hr at 09/27/22 1911     LOS: 4 days    Time spent:  Azucena Fallen, DO [facility]  If 7PM-7AM, please contact night-coverage [email or web address]  09/29/2022, 7:37 AM

## 2022-09-29 NOTE — Progress Notes (Signed)
NIF -40 FVC 2.65

## 2022-09-29 NOTE — Progress Notes (Signed)
Trazodone 25 mg as needed received from Dr. Arville Care. Will administer and continue to monitor.

## 2022-09-30 MED ORDER — POLYETHYLENE GLYCOL 3350 17 G PO PACK
17.0000 g | PACK | Freq: Two times a day (BID) | ORAL | Status: DC
  Administered 2022-09-30: 17 g via ORAL
  Filled 2022-09-30: qty 1

## 2022-09-30 MED ORDER — SENNOSIDES-DOCUSATE SODIUM 8.6-50 MG PO TABS
1.0000 | ORAL_TABLET | Freq: Two times a day (BID) | ORAL | Status: DC
  Administered 2022-09-30: 1 via ORAL
  Filled 2022-09-30: qty 1

## 2022-09-30 MED ORDER — MAGNESIUM CITRATE PO SOLN
1.0000 | Freq: Once | ORAL | Status: AC
  Administered 2022-09-30: 1 via ORAL
  Filled 2022-09-30: qty 296

## 2022-09-30 NOTE — Progress Notes (Signed)
Neurology Progress Note   S:// Seen and examined Stable rash on the back Somewhat better in terms of sensation in the legs.  Also able to pick up pennies and coins from the table-hand dexterity better  O:// Current vital signs: BP 131/70 (BP Location: Right Arm)   Pulse 95   Temp 97.9 F (36.6 C) (Oral)   Resp 20   Ht 5\' 10"  (1.778 m)   Wt 94.7 kg   SpO2 99%   BMI 29.96 kg/m  Vital signs in last 24 hours: Temp:  [97.7 F (36.5 C)-98.6 F (37 C)] 97.9 F (36.6 C) (11/19 0750) Pulse Rate:  [70-95] 95 (11/19 0750) Resp:  [14-20] 20 (11/19 0750) BP: (128-156)/(70-101) 131/70 (11/19 0750) SpO2:  [93 %-100 %] 99 % (11/19 0750) GENERAL: Awake, alert in NAD HEENT: - Normocephalic and atraumatic, dry mm, no LN++, no Thyromegally LUNGS - Clear to auscultation bilaterally with no wheezes CV - S1S2 RRR, no m/r/g, equal pulses bilaterally. ABDOMEN - Soft, nontender, nondistended with normoactive BS Ext: warm, well perfused, intact peripheral pulses,  Skin-rash on his back - says stable since yesterday, non tender, no pus or drainage, not itchy Neurologic examination Awake alert oriented x3 No dysarthria No aphasia Cranial nerves II to XII intact Motor examination with mild flexor weakness but otherwise intact. Dexterity somewhat improved - was not able to pick up coins from a table 3 days ago-but continues to be able to for the last 2 days. Sensation to vibration at the toes is mildly improved from the previous few days and is able to perceive some sensation on his toes.  Tingling is also subjectively better.  Somewhat sensory improvement noted also in the upper extremities. Coordination with no dysmetria DTRs: Remain absent all over   Medications  Current Facility-Administered Medications:    acetaminophen (TYLENOL) tablet 650 mg, 650 mg, Oral, Q4H PRN, 650 mg at 09/29/22 2123 **OR** acetaminophen (TYLENOL) 160 MG/5ML solution 650 mg, 650 mg, Per Tube, Q4H PRN **OR**  acetaminophen (TYLENOL) suppository 650 mg, 650 mg, Rectal, Q4H PRN, 2124, MD   aspirin suppository 300 mg, 300 mg, Rectal, Daily **OR** aspirin tablet 325 mg, 325 mg, Oral, Daily, Jonah Blue, MD, 325 mg at 09/29/22 10/01/22   diphenhydrAMINE (BENADRYL) capsule 25 mg, 25 mg, Oral, Q6H PRN, Mansy, Jan A, MD, 25 mg at 09/29/22 2124   enoxaparin (LOVENOX) injection 40 mg, 40 mg, Subcutaneous, Q24H, 2125, MD, 40 mg at 09/29/22 2126   gabapentin (NEURONTIN) capsule 100 mg, 100 mg, Oral, TID, 2127, MD, 100 mg at 09/29/22 2124   losartan (COZAAR) tablet 25 mg, 25 mg, Oral, Daily, 2125, MD, 25 mg at 09/29/22 10/01/22   multivitamin with minerals tablet 1 tablet, 1 tablet, Oral, Daily, 1937, MD, 1 tablet at 09/29/22 10/01/22   nicotine (NICODERM CQ - dosed in mg/24 hours) patch 21 mg, 21 mg, Transdermal, Daily, 9024, MD, 21 mg at 09/29/22 0939   ondansetron (ZOFRAN) injection 4 mg, 4 mg, Intravenous, Q6H PRN, 10/01/22, MD   senna-docusate (Senokot-S) tablet 1 tablet, 1 tablet, Oral, QHS PRN, Jonah Blue, MD, 1 tablet at 09/29/22 1839   sodium chloride flush (NS) 0.9 % injection 3 mL, 3 mL, Intravenous, Q12H, 10/01/22, MD, 3 mL at 09/29/22 2224   traZODone (DESYREL) tablet 25 mg, 25 mg, Oral, QHS PRN, Mansy, Jan A, MD, 25 mg at 09/29/22 2124 Labs CBC    Component Value Date/Time   WBC 7.5  09/24/2022 1240   RBC 5.11 09/24/2022 1240   HGB 17.1 (H) 09/24/2022 1240   HCT 49.3 09/24/2022 1240   PLT 222 09/24/2022 1240   MCV 96.5 09/24/2022 1240   MCH 33.5 09/24/2022 1240   MCHC 34.7 09/24/2022 1240   RDW 12.9 09/24/2022 1240   LYMPHSABS 2.1 09/24/2022 1240   MONOABS 0.7 09/24/2022 1240   EOSABS 0.4 09/24/2022 1240   BASOSABS 0.1 09/24/2022 1240    CMP     Component Value Date/Time   NA 137 09/24/2022 1240   K 4.0 09/24/2022 1240   CL 104 09/24/2022 1240   CO2 23 09/24/2022 1240   GLUCOSE 172 (H)  09/24/2022 1240   BUN 22 09/24/2022 1240   CREATININE 0.98 09/24/2022 1240   CALCIUM 8.8 (L) 09/24/2022 1240   PROT 7.6 09/24/2022 1240   ALBUMIN 4.0 09/24/2022 1240   AST 28 09/24/2022 1240   ALT 45 (H) 09/24/2022 1240   ALKPHOS 50 09/24/2022 1240   BILITOT 0.6 09/24/2022 1240   GFRNONAA >60 09/24/2022 1240    Imaging I have reviewed images in epic and the results pertinent to this consultation are: MR brain negative for acute stroke  LP done-CSF with protein 89, glucose 80, RBC 0 and 91, WBC 9 and 4.   Assessment: 71 year old with no significant past medical history presenting with numbness in hands and feet that started a few days ago and worsening weakness in the lower extremities with the preceding viral illness about a few weeks ago who has lack of reflexes and and a sending type sensory loss with CSF showing albumin cytological dissociation. 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 Started on IVIG 11/14- 09/29/2022 Developed a rash on the back-no itching, not spread since last 2 to 3 days-unclear if it is IVIG related.  Has gotten Benadryl that has helped.   Impression: Guillain-Barr syndrome-status post IVIG for 5 days.  Recommendations: Complete 5 days of IVIG-this evening last dose. Aspirin while he is on IVIG DVT prophylaxis Counseled on smoking cessation Monitor breathing carefully-with NIF and FVCs - OK to do q12 hours. If dropping, do more frequent checks. PT OT Neurology will sign off and will be available as needed.  Please call with questions. He needs outpatient neurological follow-up in about 4 weeks-I have provided the family the number for [facility].  Please provide referral for [facility] on discharge. D/W Dr Natale Milch  -- Milon Dikes, MD Neurologist [facility]

## 2022-09-30 NOTE — Progress Notes (Signed)
Mobility Specialist Progress Note:   09/30/22 1414  Mobility  Activity Ambulated with assistance in hallway  Level of Assistance Standby assist, set-up cues, supervision of patient - no hands on  Assistive Device Front wheel walker  Distance Ambulated (ft) 550 ft  Activity Response Tolerated well  Mobility Referral Yes  $Mobility charge 1 Mobility   Pt received in chair and agreeable. No complaints. Pt left in chair with all needs met and call bell in reach.   Andrey Campanile Mobility Specialist Please contact via SecureChat or  Rehab office at [phone]

## 2022-09-30 NOTE — Progress Notes (Signed)
NIF -40, VC 2.8 good effort

## 2022-09-30 NOTE — Progress Notes (Signed)
NIF -40, VC 2.7L. Great patient effort.

## 2022-09-30 NOTE — Progress Notes (Signed)
PROGRESS NOTE    Joe Greene  [id number] DOB: May 24, 1951 DOA: 09/24/2022 PCP: Haydee Salter, MD   Brief Narrative:  Joe Greene is a 71 y.o. male without significant past medical history presenting with RLE weakness.  He was seen in the ER on 11/10 for intractable headache (negative head CT) and in teleconsultation yesterday and reported 3 weeks ago he started with an intractable heache.  Patient began having ascending numbness in his fingers and toes subsequently involving his feet and hands.  He reports profoundly elevated blood pressure at home as well.  Admitted to hospital service, neurology consulted  Assessment & Plan:   Principal Problem:   Stroke-like symptoms Active Problems:   Tobacco use   Hypertriglyceridemia   AAA (abdominal aortic aneurysm) (HCC)   Elevated blood-pressure reading without diagnosis of hypertension   Pre-diabetes  Bilateral parasthesias Rule out Guillain Barre -MRI negative -LP performed 14th - results unremarkable -Neuro follownig -completed IVIG 09/29/2022 -follow-up with neurology in 4 weeks -Symptoms improving slowly -Gabapentin low dose initiated for neuropathic pain  Pre-diabetes -Mildly elevated glucose, A1c 6.5 -Nutrition consult -continue to discuss lifestyle and dietary changes   Elevated blood-pressure reading without diagnosis of hypertension -No previous diagnosis of HTN -Currently well controlled on low-dose losartan 25 mg daily  AAA (abdominal aortic aneurysm) ([place]) -CTA ordered due to concern raised on L-sine X-ray -Infrarenal abdominal aortic aneurysm measuring up to 4.6 x 4.6 cm.  -No evidence of dissection -Recommend follow-up CT/MR every 6 months and vascular consultation as an outpatient   Hypertriglyceridemia -This has been uptrending -Needs medication - fish oil vs. Niacin -Will defer to PCP -Has appt with PCP scheduled for next week   Tobacco use -Patch provided  Constipation -Initiate scheduled MiraLAX  Senokot, mag citrate this afternoon x1 if no improvement  DVT prophylaxis: Lovenox Code Status: Full Family Communication: Wife at bedside  Status is: Inpatient  Dispo: The patient is from: Home              Anticipated d/c is to: Inpatient rehab              Anticipated d/c date is: Imminent              Patient currently not medically stable for discharge  Consultants:  Neurology  Procedures:  LP  Antimicrobials:  None  Subjective: No acute issues or events -reports improving symptoms in his hands and feet although not yet resolved.  Constipation noted today  Objective: Vitals:   09/29/22 2112 09/29/22 2214 09/30/22 0036 09/30/22 0405  BP: (!) 136/91 (!) 142/91 131/73 (!) 156/101  Pulse: 76 75 70 73  Resp: 18 16 18 18   Temp: 97.7 F (36.5 C) 98 F (36.7 C) 98.6 F (37 C) 97.8 F (36.6 C)  TempSrc: Oral Oral Axillary Oral  SpO2: 96% 96% 98% 93%  Weight:      Height:        Intake/Output Summary (Last 24 hours) at 09/30/2022 0729 Last data filed at 09/29/2022 0736 Gross per 24 hour  Intake 357 ml  Output --  Net 357 ml    Filed Weights   09/24/22 1207 09/25/22 2134 09/27/22 1513  Weight: 96.2 kg 98.4 kg 94.7 kg    Examination:  General:  Pleasantly resting in bed, No acute distress. HEENT:  Normocephalic atraumatic.  Sclerae nonicteric, noninjected.  Extraocular movements intact bilaterally. Neck:  Without mass or deformity.  Trachea is midline. Lungs:  Clear to auscultate bilaterally without rhonchi, wheeze, or  rales. Heart:  Regular rate and rhythm.  Without murmurs, rubs, or gallops. Abdomen:  Soft, nontender, nondistended.  Without guarding or rebound. Extremities: Without cyanosis, clubbing, edema, or obvious deformity.  Decreased sensation distal fingertips and feet. Vascular:  Dorsalis pedis and posterior tibial pulses palpable bilaterally. Skin:  Warm and dry, no erythema, no ulcerations.   Data Reviewed: I have personally reviewed following  labs and imaging studies  CBC: Recent Labs  Lab 09/24/22 1240  WBC 7.5  NEUTROABS 4.2  HGB 17.1*  HCT 49.3  MCV 96.5  PLT 222    Basic Metabolic Panel: Recent Labs  Lab 09/24/22 1240  NA 137  K 4.0  CL 104  CO2 23  GLUCOSE 172*  BUN 22  CREATININE 0.98  CALCIUM 8.8*  MG 2.1    GFR: Estimated Creatinine Clearance: 79.9 mL/min (by C-G formula based on SCr of 0.98 mg/dL). Liver Function Tests: Recent Labs  Lab 09/24/22 1240  AST 28  ALT 45*  ALKPHOS 50  BILITOT 0.6  PROT 7.6  ALBUMIN 4.0    Recent Labs  Lab 09/24/22 1240  LIPASE 41    No results for input(s): "AMMONIA" in the last 168 hours. Coagulation Profile: No results for input(s): "INR", "PROTIME" in the last 168 hours. Cardiac Enzymes: No results for input(s): "CKTOTAL", "CKMB", "CKMBINDEX", "TROPONINI" in the last 168 hours. BNP (last 3 results) Recent Labs    09/24/22 0840  PROBNP 12.0    HbA1C: No results for input(s): "HGBA1C" in the last 72 hours.  CBG: No results for input(s): "GLUCAP" in the last 168 hours. Lipid Profile: No results for input(s): "CHOL", "HDL", "LDLCALC", "TRIG", "CHOLHDL", "LDLDIRECT" in the last 72 hours.  Thyroid Function Tests: No results for input(s): "TSH", "T4TOTAL", "FREET4", "T3FREE", "THYROIDAB" in the last 72 hours.  Anemia Panel: No results for input(s): "VITAMINB12", "FOLATE", "FERRITIN", "TIBC", "IRON", "RETICCTPCT" in the last 72 hours.  Sepsis Labs: No results for input(s): "PROCALCITON", "LATICACIDVEN" in the last 168 hours.  Recent Results (from the past 240 hour(s))  Gram stain     Status: None   Collection Time: 09/25/22 12:54 PM   Specimen: CSF; Cerebrospinal Fluid  Result Value Ref Range Status   Specimen Description CSF  Final   Special Requests NONE  Final   Gram Stain   Final    WBC PRESENT, PREDOMINANTLY MONONUCLEAR NO ORGANISMS SEEN CYTOSPIN SMEAR Performed at [facility], [address] [place], [place] [address]     Report Status 09/25/2022 FINAL  Final         Radiology Studies: No results found.      Scheduled Meds:  aspirin  300 mg Rectal Daily   Or   aspirin  325 mg Oral Daily   enoxaparin (LOVENOX) injection  40 mg Subcutaneous Q24H   gabapentin  100 mg Oral TID   losartan  25 mg Oral Daily   multivitamin with minerals  1 tablet Oral Daily   nicotine  21 mg Transdermal Daily   sodium chloride flush  3 mL Intravenous Q12H   Continuous Infusions:     LOS: 5 days    Time spent:  Azucena Fallen, DO [facility]  If 7PM-7AM, please contact night-coverage [email or web address]  09/30/2022, 7:29 AM

## 2022-09-30 NOTE — Progress Notes (Signed)
Inpatient Rehab Admissions Coordinator:  Note PT continues to recommend CIR. Spoke with pt regarding interest in pursuing CIR. Pt is interested. Will begin insurance authorization once updated OT clinicals are completed.  Will continue to follow.   Wolfgang Phoenix, MS, CCC-SLP Admissions Coordinator [phone]

## 2022-09-30 NOTE — Progress Notes (Signed)
Physical Therapy Treatment Patient Details Name: Joe Greene MRN: [id number] DOB: 09/28/1951 Today's Date: 09/30/2022   History of Present Illness Pt is a 71 y.o. M who presents 09/24/2022 with dizziness, HA, RLE weakness, numbness in his fingers. MRI negative for acute stroke. Significant PMH: none.    PT Comments    Pt reports mild improvement in BLE sensation and demonstrates progression towards his physical therapy goals. Session focused on gait training and closed chain exercises to target coordination, control, and proprioception. Pt ambulating limited hallway distances with a RW at a min guard assist level, however, with a cane, experiences gross posterior loss of balance, requiring assist to correct. 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 Continue to recommend AIR to address deficits and maximize functional independence.     Recommendations for follow up therapy are one component of a multi-disciplinary discharge planning process, led by the attending physician.  Recommendations may be updated based on patient status, additional functional criteria and insurance authorization.  Follow Up Recommendations  Acute inpatient rehab (3hours/day)     Assistance Recommended at Discharge Frequent or constant Supervision/Assistance  Patient can return home with the following A lot of help with walking and/or transfers;Assistance with cooking/housework;Assist for transportation;Help with stairs or ramp for entrance   Equipment Recommendations  None recommended by PT (pt has RW)    Recommendations for Other Services       Precautions / Restrictions Precautions Precautions: Fall;Other (comment) Precaution Comments: impaired sensation BLE R>L Restrictions Weight Bearing Restrictions: No     Mobility  Bed Mobility               General bed mobility comments: OOB in recliner upon entrance    Transfers Overall transfer level:  Needs assistance Equipment used: Rolling walker (2 wheels), None Transfers: Sit to/from Stand Sit to Stand: Supervision                Ambulation/Gait Ambulation/Gait assistance: Min guard, Min assist Gait Distance (Feet): 200 Feet (200", 200") Assistive device: Rolling walker (2 wheels), Straight cane Gait Pattern/deviations: Step-through pattern, Decreased step length - left, Decreased dorsiflexion - right, Decreased weight shift to right Gait velocity: variable     General Gait Details: continued improvement with stride length and LE motor control. Min guard with RW, minA with cane with + posterior LOB. Cues for smaller step lengths for improved control.   Stairs Stairs: Yes Stairs assistance: Min guard Stair Management: Two rails Number of Stairs: 20 General stair comments: 10 step ups with R foot leading, 10 step ups with L foot leading   Wheelchair Mobility    Modified Rankin (Stroke Patients Only) Modified Rankin (Stroke Patients Only) Pre-Morbid Rankin Score: No symptoms Modified Rankin: Moderately severe disability     Balance Overall balance assessment: Needs assistance Sitting-balance support: Feet supported Sitting balance-Leahy Scale: Good     Standing balance support: During functional activity, Single extremity supported Standing balance-Leahy Scale: Poor Standing balance comment: reliance on at least single UE support                            Cognition Arousal/Alertness: Awake/alert Behavior During Therapy: WFL for tasks assessed/performed Overall Cognitive Status: Within Functional Limits for tasks assessed  Exercises Other Exercises Other Exercises: 20 step ups alternating L vs R foot Other Exercises: Lateral stepping to R/L with bilateral hand support Other Exercises: Targeted stepping with R and L foot with visual cue/target with R hand rail assist    General  Comments        Pertinent Vitals/Pain Pain Assessment Pain Assessment: Faces Faces Pain Scale: No hurt    Home Living                          Prior Function            PT Goals (current goals can now be found in the care plan section) Acute Rehab PT Goals PT Goal Formulation: With patient/family Time For Goal Achievement: 10/09/22 Potential to Achieve Goals: Good Progress towards PT goals: Progressing toward goals    Frequency    Min 3X/week      PT Plan Frequency needs to be updated    Co-evaluation              AM-PAC PT "6 Clicks" Mobility   Outcome Measure  Help needed turning from your back to your side while in a flat bed without using bedrails?: None Help needed moving from lying on your back to sitting on the side of a flat bed without using bedrails?: None Help needed moving to and from a bed to a chair (including a wheelchair)?: A Little Help needed standing up from a chair using your arms (e.g., wheelchair or bedside chair)?: A Little Help needed to walk in hospital room?: A Little Help needed climbing 3-5 steps with a railing? : A Little 6 Click Score: 20    End of Session Equipment Utilized During Treatment: Gait belt Activity Tolerance: Patient tolerated treatment well;Patient limited by fatigue Patient left: with call bell/phone within reach;with family/visitor present;in chair;with nursing/sitter in room Nurse Communication: Mobility status PT Visit Diagnosis: Unsteadiness on feet (R26.81);Other abnormalities of gait and mobility (R26.89);Difficulty in walking, not elsewhere classified (R26.2)     Time: 0174-9449 PT Time Calculation (min) (ACUTE ONLY): 19 min  Charges:  $Gait Training: 8-22 mins                     Joe Greene, PT, DPT Acute Rehabilitation Services Office [phone]    Joe Greene 09/30/2022, 11:30 AM

## 2022-10-01 ENCOUNTER — Ambulatory Visit: Payer: Medicare PPO | Admitting: Family Medicine

## 2022-10-01 MED ORDER — NICOTINE 21 MG/24HR TD PT24: 21.0000 mg | MEDICATED_PATCH | Freq: Every day | TRANSDERMAL | 0 refills | Status: DC

## 2022-10-01 MED ORDER — GABAPENTIN 100 MG PO CAPS: 100.0000 mg | ORAL_CAPSULE | Freq: Three times a day (TID) | ORAL | 1 refills | Status: DC

## 2022-10-01 MED ORDER — LOSARTAN POTASSIUM 25 MG PO TABS: 25.0000 mg | ORAL_TABLET | Freq: Every day | ORAL | 1 refills | Status: DC

## 2022-10-01 MED ORDER — ADULT MULTIVITAMIN W/MINERALS CH: 1.0000 | ORAL_TABLET | Freq: Every day | ORAL | 1 refills | Status: DC

## 2022-10-01 NOTE — Plan of Care (Signed)

## 2022-10-01 NOTE — Progress Notes (Signed)
Mobility Specialist: Progress Note   10/01/22 1004  Mobility  Activity Ambulated with assistance in hallway  Level of Assistance Contact guard assist, steadying assist  Assistive Device Front wheel walker  Distance Ambulated (ft) 350 ft  Activity Response Tolerated well  Mobility Referral Yes  $Mobility charge 1 Mobility   Post-Mobility: 98% SpO2  Pt received in the chair and agreeable to mobility. C/o L knee soreness during ambulation. Pt occasionally veering L/R, contact guard for balance. No c/o dizziness. Attempted in room ambulation w/o AD after returning to the room. Pt mildly unsteady but no overt LOB. Pt back to the chair after session with pt's wife present in the room.   Antoinette Borgwardt Mobility Specialist Please contact via SecureChat or Rehab office at [phone]

## 2022-10-01 NOTE — Progress Notes (Signed)
  Inpatient Rehabilitation Admissions Coordinator   I have begun insurance approval with [employer] for possible Cir admit.  Ottie Glazier, RN, MSN Rehab Admissions Coordinator [phone] 10/01/2022 8:12 AM

## 2022-10-01 NOTE — TOC Transition Note (Signed)
Transition of Care [facility]) - CM/SW Discharge Note   Patient Details  Name: Joe Greene MRN: [id number] Date of Birth: [birth year]  [facility]) CM/SW Contact:  Kermit Balo, RN Phone Number: 10/01/2022, 11:08 AM   Clinical Narrative:    Patient has decided to D/c home with home health services. CM provided choice and [facility] decided on. Information on the AVS.  Pt has needed DME at home.  Pts spouse to provide transport home.    Final next level of care: Home w Home Health Services Barriers to Discharge: No Barriers Identified   Patient Goals and CMS Choice   CMS Medicare.gov Compare Post Acute Care list provided to:: Patient Choice offered to / list presented to : Patient, Spouse  Discharge Placement                       Discharge Plan and Services   Discharge Planning Services: CM Consult Post Acute Care Choice: IP Rehab                    HH Arranged: PT, OT HH Agency: [facility] Date [facility] Agency Contacted: 10/01/22   Representative spoke with at [facility] Agency: Kandee Keen  Social Determinants of Health (SDOH) Interventions     Readmission Risk Interventions     No data to display

## 2022-10-01 NOTE — Progress Notes (Signed)
Discharged to home after IV access removed and discharge instructions given to pt and wife.

## 2022-10-01 NOTE — Progress Notes (Signed)
Occupational Therapy Treatment Patient Details Name: Joe Greene MRN: [id number] DOB: August 22, 1951 Today's Date: 10/01/2022   History of present illness Pt is a 71 y.o. M who presents 09/24/2022 with dizziness, HA, RLE weakness, numbness in his fingers. MRI negative for acute stroke. Significant PMH: none.   OT comments  Pt progressing well towards OT goals, remains limited by sensation and balance deficits. Pt able to mobilize fairly well with RW and min guard. However, when attempting standing tasks without AD, pt with multiple minor LOB and reliant on UE support to correct - which is fair below pt's typically independent baseline. Pt with improving ability to open packaging for meals though anticipate increased difficulty with management of full IADLs at home with current deficits. Continue to rec AIR level therapies to maximize recovery to PLOF and facilitate safe DC home.   Recommendations for follow up therapy are one component of a multi-disciplinary discharge planning process, led by the attending physician.  Recommendations may be updated based on patient status, additional functional criteria and insurance authorization.    Follow Up Recommendations  Acute inpatient rehab (3hours/day)     Assistance Recommended at Discharge Intermittent Supervision/Assistance  Patient can return home with the following  A little help with walking and/or transfers;A little help with bathing/dressing/bathroom;Assistance with cooking/housework;Help with stairs or ramp for entrance;Assist for transportation   Equipment Recommendations  Other (comment) (RW; TBD)    Recommendations for Other Services Rehab consult    Precautions / Restrictions Precautions Precautions: Fall;Other (comment) Precaution Comments: impaired sensation BLE R>L Restrictions Weight Bearing Restrictions: No       Mobility Bed Mobility Overal bed mobility: Modified Independent                  Transfers Overall  transfer level: Needs assistance Equipment used: Rolling walker (2 wheels), None Transfers: Sit to/from Stand Sit to Stand: Supervision           General transfer comment: Supervision without AD, Mod I with RW     Balance Overall balance assessment: Needs assistance Sitting-balance support: Feet supported Sitting balance-Leahy Scale: Good     Standing balance support: During functional activity, No upper extremity supported, Bilateral upper extremity supported Standing balance-Leahy Scale: Poor Standing balance comment: reliant on at least one UE support with standing                           ADL either performed or assessed with clinical judgement   ADL Overall ADL's : Needs assistance/impaired Eating/Feeding: Independent;Sitting Eating/Feeding Details (indicate cue type and reason): able to open containers, kethcup packaging                                 Functional mobility during ADLs: Min guard;Rolling walker (2 wheels) General ADL Comments: Much improved balance/safety with RW use though one instance of knee buckling with long distance mobility. Emphasis on fine motor coordination, sensation precautions (handouts provided). Trial without AD in standing with marching in place and multiple minor LOB and need to catch self with RW    Extremity/Trunk Assessment Upper Extremity Assessment Upper Extremity Assessment: LUE deficits/detail;RUE deficits/detail RUE Deficits / Details: strength BUE 5/5, coordination WFL, numbness progressing into tingling LUE Deficits / Details: strength BUE 5/5, coordination WFL, numbness progressing into tingling   Lower Extremity Assessment Lower Extremity Assessment: Defer to PT evaluation        Vision  Vision Assessment?: No apparent visual deficits   Perception     Praxis      Cognition Arousal/Alertness: Awake/alert Behavior During Therapy: WFL for tasks assessed/performed Overall Cognitive Status:  Within Functional Limits for tasks assessed                                          Exercises      Shoulder Instructions       General Comments Wife present, inquiring about ability for her to assist pt w/ hallway mobility - Discussed with pt's RN for the day, who reports also being a Psychologist, occupational for the floor and based on pt's fall risk score, that is not allowed - relayed to pt/family.    Pertinent Vitals/ Pain       Pain Assessment Pain Assessment: No/denies pain  Home Living                                          Prior Functioning/Environment              Frequency  Min 2X/week        Progress Toward Goals  OT Goals(current goals can now be found in the care plan section)  Progress towards OT goals: Progressing toward goals  Acute Rehab OT Goals Patient Stated Goal: go to rehab here, find out today OT Goal Formulation: With patient Time For Goal Achievement: 10/09/22 Potential to Achieve Goals: Good ADL Goals Pt Will Perform Lower Body Bathing: with modified independence;sit to/from stand Pt Will Perform Lower Body Dressing: with modified independence;sitting/lateral leans Pt Will Transfer to Toilet: with modified independence;ambulating  Plan Discharge plan remains appropriate;Frequency remains appropriate    Co-evaluation                 AM-PAC OT "6 Clicks" Daily Activity     Outcome Measure   Help from another person eating meals?: None Help from another person taking care of personal grooming?: A Little Help from another person toileting, which includes using toliet, bedpan, or urinal?: A Little Help from another person bathing (including washing, rinsing, drying)?: A Lot Help from another person to put on and taking off regular upper body clothing?: A Little Help from another person to put on and taking off regular lower body clothing?: A Lot 6 Click Score: 17    End of Session Equipment Utilized  During Treatment: Gait belt;Rolling walker (2 wheels)  OT Visit Diagnosis: Unsteadiness on feet (R26.81);Other abnormalities of gait and mobility (R26.89);Other (comment)   Activity Tolerance Patient tolerated treatment well   Patient Left in chair;with call bell/phone within reach;with family/visitor present   Nurse Communication Mobility status        Time: 4709-6283 OT Time Calculation (min): 17 min  Charges: OT General Charges $OT Visit: 1 Visit OT Treatments $Therapeutic Activity: 8-22 mins  Joe Greene, OTR/L Acute Rehab Services Office: [phone]   Joe Greene 10/01/2022, 7:58 AM

## 2022-10-01 NOTE — Discharge Summary (Signed)
Physician Discharge Summary  Joe Greene [id number] DOB: January 13, 1951 DOA: 09/24/2022  PCP: Haydee Salter, MD  Admit date: 09/24/2022 Discharge date: 10/01/2022  Admitted From: Home Disposition:  Home w/ home health  Recommendations for Outpatient Follow-up:  Follow up with PCP in 1-2 weeks Please obtain BMP/CBC in one week Please follow up on the following pending results:  Home Health: PT Equipment/Devices: No new equipment  Discharge Condition:Stable  CODE STATUS:Full  Diet recommendation: As tolerated, low salt, low fat diet    Brief/Interim Summary: Joe Greene is a 71 y.o. male without significant past medical history presenting with RLE weakness.  He was seen in the ER on 11/10 for intractable headache (negative head CT) and in teleconsultation yesterday and reported 3 weeks ago he started with an intractable heache.  Patient began having ascending numbness in his fingers and toes subsequently involving his feet and hands.  He reports profoundly elevated blood pressure at home as well.  Admitted to hospital service, neurology consulted.  Admitted as above with concern for Guillain-Barr syndrome.  Patient completed 5 days of IVIG, LP unremarkable, CT MRI of the head unremarkable.  Patient symptoms continue to improve, evaluated by PT OT and initially was planning to discharge to rehab given profound weakness and paresthesias but given improvement patient is now stable and agreeable for discharge home with home health.  Follow-up with PCP in 1 to 2 weeks, follow-up with neurology as scheduled.  Medication changes as below, new medications including gabapentin and losartan.  Discharge Diagnoses:  Principal Problem:   Stroke-like symptoms Active Problems:   Tobacco use   Hypertriglyceridemia   AAA (abdominal aortic aneurysm) (HCC)   Elevated blood-pressure reading without diagnosis of hypertension   Pre-diabetes  Bilateral parasthesias Cannot rule out Guillain Barre -MRI  negative -LP performed 14th - results unremarkable -Neuro follownig -completed IVIG 09/29/2022 -follow-up with neurology in 4 weeks -Symptoms improving slowly -Gabapentin low dose initiated for neuropathic pain   Pre-diabetes -Mildly elevated glucose, A1c 6.5 -Nutrition consult -continue to discuss lifestyle and dietary changes   Elevated blood-pressure reading without diagnosis of hypertension -No previous diagnosis of HTN -Currently well controlled on low-dose losartan 25 mg daily   AAA (abdominal aortic aneurysm) ([facility]) -CTA ordered due to concern raised on L-sine X-ray -Infrarenal abdominal aortic aneurysm measuring up to 4.6 x 4.6 cm.  -No evidence of dissection -Recommend follow-up CT/MR every 6 months and vascular consultation as an outpatient as indicated   Hypertriglyceridemia -This has been uptrending -Needs medication - fish oil vs. Niacin -Will defer to PCP   Tobacco use -Patch provided   Constipation -Initiate scheduled MiraLAX Senokot, mag citrate this afternoon x1 if no improvement  Discharge Instructions  Discharge Instructions     Face-to-face encounter (required for Medicare/Medicaid patients)   Complete by: As directed    The encounter with the patient was in whole, or in part, for the following medical condition, which is the primary reason for home health care: GBS, ambulatory dysfunction   I certify that, based on my findings, the following services are medically necessary home health services: Physical therapy   Reason for Medically Necessary Home Health Services:  Therapy- Personnel officer, Public librarian Therapy- Therapeutic Exercises to Increase Strength and Endurance     My clinical findings support the need for the above services: Unable to leave home safely without assistance and/or assistive device   Further, I certify that my clinical findings support that this patient is homebound due to: Unable  to leave home safely without  assistance   Home Health   Complete by: As directed    To provide the following care/treatments: PT      Allergies as of 10/01/2022   No Known Allergies      Medication List     STOP taking these medications    albuterol 108 (90 Base) MCG/ACT inhaler Commonly known as: VENTOLIN HFA   hydrOXYzine 25 MG capsule Commonly known as: VISTARIL   predniSONE 50 MG tablet Commonly known as: DELTASONE       TAKE these medications    calcium carbonate 750 MG chewable tablet Commonly known as: TUMS EX Chew 1,500 mg by mouth 2 (two) times daily as needed for heartburn.   ferrous sulfate 325 (65 FE) MG EC tablet Take 325 mg by mouth daily. When able to remember   FISH OIL PO Take 1 capsule by mouth daily. When able to remember   fluticasone 50 MCG/ACT nasal spray Commonly known as: FLONASE Place 2 sprays into both nostrils 2 (two) times daily as needed for allergies or rhinitis.   gabapentin 100 MG capsule Commonly known as: NEURONTIN Take 1 capsule (100 mg total) by mouth 3 (three) times daily.   ibuprofen 200 MG tablet Commonly known as: ADVIL Take 400 mg by mouth 3 (three) times daily as needed for headache or mild pain.   losartan 25 MG tablet Commonly known as: COZAAR Take 1 tablet (25 mg total) by mouth daily. Start taking on: October 02, 2022   MAGNESIUM PO Take 1 tablet by mouth daily. When able to remember   multivitamin with minerals Tabs tablet Take 1 tablet by mouth daily. Start taking on: October 02, 2022   nicotine 21 mg/24hr patch Commonly known as: NICODERM CQ - dosed in mg/24 hours Place 1 patch (21 mg total) onto the skin daily. Start taking on: October 02, 2022   sildenafil 100 MG tablet Commonly known as: Viagra Take 0.5-1 tablets (50-100 mg total) by mouth daily as needed for erectile dysfunction. What changed: how much to take        Follow-up Information     [facility] Follow up.   Specialty: Home Health  Services Why: The home health agency will contact you for the first home visit. Contact information: [place] [address] [phone]                No Known Allergies  Consultations: Neuro  Procedures/Studies: MR BRAIN WO CONTRAST  Result Date: 09/25/2022 CLINICAL DATA:  Neuro deficit, acute, stroke suspected. Balance disturbance. EXAM: MRI HEAD WITHOUT CONTRAST MRA HEAD WITHOUT CONTRAST TECHNIQUE: Multiplanar, multi-echo pulse sequences of the brain and surrounding structures were acquired without intravenous contrast. Angiographic images of the Circle of Willis were acquired using MRA technique without intravenous contrast. COMPARISON:  Head CT 09/21/2022 FINDINGS: MRI HEAD FINDINGS Brain: Diffusion imaging does not show any acute or subacute infarction. No focal insult affects the cerebellum. Minimal chronic small-vessel ischemic change of the pons. Cerebral hemispheres show mild chronic small-vessel ischemic changes of the white matter. No cortical or large vessel territory infarction. No mass lesion, hemorrhage, hydrocephalus or extra-axial collection. Vascular: Major vessels at the base of the brain show flow. Skull and upper cervical spine: Negative Sinuses/Orbits: Paranasal sinuses are clear. Orbits are normal. There is a mastoid effusion on the left. Other: None MRA HEAD FINDINGS Anterior circulation: Both internal carotid arteries are widely patent through the skull base and siphon  regions. The anterior and middle cerebral vessels are normal. No large vessel occlusion or proximal stenosis. No aneurysm or vascular malformation. Posterior circulation: Both vertebral arteries widely patent to the basilar artery. No basilar stenosis. Posterior circulation branch vessels are patent. Artifactual signal loss in the right PCA secondary to tortuosity. No true stenosis suspected. Anatomic variants: None IMPRESSION: 1. No acute brain finding. Mild chronic  small-vessel ischemic change of the pons and cerebral hemispheric white matter. 2. Negative intracranial MR angiography of the large and medium size vessels. 3. Left mastoid effusion. Electronically Signed   By: Nelson Chimes M.D.   On: 09/25/2022 10:40   MR ANGIO HEAD WO CONTRAST  Result Date: 09/25/2022 CLINICAL DATA:  Neuro deficit, acute, stroke suspected. Balance disturbance. EXAM: MRI HEAD WITHOUT CONTRAST MRA HEAD WITHOUT CONTRAST TECHNIQUE: Multiplanar, multi-echo pulse sequences of the brain and surrounding structures were acquired without intravenous contrast. Angiographic images of the Circle of Willis were acquired using MRA technique without intravenous contrast. COMPARISON:  Head CT 09/21/2022 FINDINGS: MRI HEAD FINDINGS Brain: Diffusion imaging does not show any acute or subacute infarction. No focal insult affects the cerebellum. Minimal chronic small-vessel ischemic change of the pons. Cerebral hemispheres show mild chronic small-vessel ischemic changes of the white matter. No cortical or large vessel territory infarction. No mass lesion, hemorrhage, hydrocephalus or extra-axial collection. Vascular: Major vessels at the base of the brain show flow. Skull and upper cervical spine: Negative Sinuses/Orbits: Paranasal sinuses are clear. Orbits are normal. There is a mastoid effusion on the left. Other: None MRA HEAD FINDINGS Anterior circulation: Both internal carotid arteries are widely patent through the skull base and siphon regions. The anterior and middle cerebral vessels are normal. No large vessel occlusion or proximal stenosis. No aneurysm or vascular malformation. Posterior circulation: Both vertebral arteries widely patent to the basilar artery. No basilar stenosis. Posterior circulation branch vessels are patent. Artifactual signal loss in the right PCA secondary to tortuosity. No true stenosis suspected. Anatomic variants: None IMPRESSION: 1. No acute brain finding. Mild chronic  small-vessel ischemic change of the pons and cerebral hemispheric white matter. 2. Negative intracranial MR angiography of the large and medium size vessels. 3. Left mastoid effusion. Electronically Signed   By: Nelson Chimes M.D.   On: 09/25/2022 10:40   CT Angio Chest/Abd/Pel for Dissection W and/or Wo Contrast  Result Date: 09/24/2022 CLINICAL DATA:  Bilateral arm and leg numbness for 4 days. Bilateral leg weakness. Dizziness. EXAM: CT ANGIOGRAPHY CHEST, ABDOMEN AND PELVIS TECHNIQUE: Multidetector CT imaging through the chest, abdomen and pelvis was performed using the standard protocol during bolus administration of intravenous contrast. Multiplanar reconstructed images and MIPs were obtained and reviewed to evaluate the vascular anatomy. RADIATION DOSE REDUCTION: This exam was performed according to the departmental dose-optimization program which includes automated exposure control, adjustment of the mA and/or kV according to patient size and/or use of iterative reconstruction technique. CONTRAST:  115mL OMNIPAQUE IOHEXOL 350 MG/ML SOLN COMPARISON:  Chest two views and lumbar spine radiographs 09/24/2022; CT chest 01/17/2022; right upper quadrant abdominal ultrasound 08/04/2021 FINDINGS: CTA CHEST FINDINGS Cardiovascular: On precontrast images, no hyperdensity is seen to indicate an intramural hematoma within the thoracic aorta. There are mild-to-moderate atherosclerotic calcifications. Dense coronary artery calcifications. The ascending aorta measures up to 3.6 cm in caliber, not aneurysmal. No thoracic aortic dissection is seen. No central pulmonary embolism is seen. Mediastinum/Nodes: No axillary, mediastinal, or hilar pathologically enlarged lymph nodes by CT criteria. The visualized thyroid is unremarkable. The esophagus follows  a normal course and normal caliber. Lungs/Pleura: The central airways are patent. Mild-to-moderate centrilobular and paraseptal emphysematous changes are again seen, greatest  within the upper lungs and not significantly changed from prior. No suspicious pulmonary nodule is seen. No acute airspace opacity. No pleural effusion or pneumothorax. Musculoskeletal: Moderate to severe C7-T1 disc space narrowing. Mild multilevel thoracic spine disc space narrowing with lower thoracic spine anterior endplate osteophytes. No aggressive lytic or blastic osseous lesions. Review of the MIP images confirms the above findings. CTA ABDOMEN AND PELVIS FINDINGS VASCULAR Aorta: There is an infrarenal abdominal aortic aneurysm measuring up to 4.6 x 4.6 cm (transverse by AP) and involving an approximate 6.9 cm length of the abdominal aorta. Moderate posterior right and mild-to-moderate anterior right noncalcified intramural plaque. No dissection is seen. Celiac: Patent without evidence of aneurysm, dissection, vasculitis or significant stenosis. SMA: Patent without evidence of aneurysm, dissection, vasculitis or significant stenosis. Renals: Both renal arteries are patent without evidence of aneurysm, dissection, vasculitis, fibromuscular dysplasia or significant stenosis. IMA: Takeoff just above the abdominal aortic aneurysm. Patent without evidence of aneurysm, dissection, vasculitis or significant stenosis. Inflow: Just proximal to the common iliac bifurcations, the right common iliac artery measures up to 1.6 cm in the left common iliac artery measures up to 1.6 cm in caliber mildly aneurysmal. Veins: No obvious venous abnormality within the limitations of this arterial phase study. Review of the MIP images confirms the above findings. NON-VASCULAR Hepatobiliary: Smooth liver contours. No gross arterial phase hepatic lesion is seen. The gallbladder is unremarkable. No intrahepatic or extrahepatic biliary ductal dilatation is seen. Pancreas: Unremarkable. No pancreatic ductal dilatation or surrounding inflammatory changes. Spleen: Normal in size without focal abnormality. Incidental note of 6 mm splenule  just anterior to the spleen (axial series 6, image 102). Adrenals/Urinary Tract: Normal adrenals. The kidneys enhance uniformly and are symmetric in size without hydronephrosis. No definite focal urinary bladder wall thickening is seen. Stomach/Bowel: Mild to moderate descending colon diverticulosis. Moderate stool throughout the colon. Mild stool within the terminal ileum suggesting chronic slow bowel transit. Normal appendix (axial series 6 images 163 through 169). No dilated loops of bowel to indicate bowel obstruction. Lymphatic: No mesenteric, retroperitoneal, or pelvic lymphadenopathy. Reproductive: The prostate and seminal vesicles are grossly unremarkable. Other: Moderate right and mild left fat containing inguinal hernias. Bilateral vasectomy clips. No free air or free fluid is seen within the abdomen or pelvis. Musculoskeletal: Minimal 2 mm retrolisthesis of L2 on L3. Mild multilevel lumbar spine endplate spurring without significant disc space narrowing. No aggressive lytic or blastic osseous lesion is seen. Review of the MIP images confirms the above findings. IMPRESSION: 1. Infrarenal abdominal aortic aneurysm measuring up to 4.6 x 4.6 cm. Recommend follow-up CT/MR every 6 months and vascular consultationThis recommendation follows ACR consensus guidelines: White Paper of the ACR Incidental Findings Committee II on Vascular Findings. J Am Coll Radiol 2013; 10:789-794. 2. No thoracic or abdominal aortic dissection. 3. Mild-to-moderate descending colon diverticulosis. 4. Moderate right and mild left fat containing inguinal hernias. Aortic Atherosclerosis (ICD10-I70.0) and Emphysema (ICD10-J43.9). Electronically Signed   By: Yvonne Kendall M.D.   On: 09/24/2022 15:09   DG Cervical Spine Complete  Result Date: 09/24/2022 CLINICAL DATA:  Neck pain, upper arm numbness EXAM: CERVICAL SPINE - COMPLETE 4+ VIEW COMPARISON:  None Available. FINDINGS: No recent fracture is seen. Lower cervical spine is not  optimally visualized in the lateral view related to patient's body habitus. In the AP and oblique views, no fracture is  seen in lower cervical spine. Degenerative changes are noted with bony spurs and facet hypertrophy at multiple levels. There is encroachment neural foramina from C3-C7 levels, more so at C5-C6 level. IMPRESSION: No recent fracture is seen. Cervical spondylosis with encroachment of neural foramina from C3-C7 levels, more severe at C5-C6 level. Electronically Signed   By: Elmer Picker M.D.   On: 09/24/2022 10:06   DG Lumbar Spine Complete  Result Date: 09/24/2022 CLINICAL DATA:  Back pain, numbness right leg EXAM: LUMBAR SPINE - COMPLETE 4+ VIEW COMPARISON:  None Available. FINDINGS: No recent fracture is seen. Alignment of posterior margins of vertebral bodies appears normal. Degenerative changes are noted with anterior bony spurs and facet hypertrophy. There is no significant disc space narrowing. In the lateral view, there is evidence of aneurysmal dilation of abdominal aorta measuring 6.1 cm in AP diameter. IMPRESSION: No recent fracture is seen in the lumbar spine. Lumbar spondylosis with anterior bony spurs and facet hypertrophy. There is aneurysmal dilation of abdominal aorta measuring 6.1 cm in AP diameter. Follow-up contrast enhanced CT should be considered for further evaluation. These results will be called to the ordering clinician or representative by the Radiologist Assistant, and communication documented in the PACS or [employer]. Electronically Signed   By: Elmer Picker M.D.   On: 09/24/2022 10:04   DG Chest 2 View  Result Date: 09/24/2022 CLINICAL DATA:  Shortness of breath EXAM: CHEST - 2 VIEW COMPARISON:  CT chest done on 01/17/2022 FINDINGS: Cardiac size is within normal limits. Thoracic aorta is tortuous and ectatic. There is increase in interstitial markings in both parahilar regions and both lower lung fields. There is no focal pulmonary  consolidation. There is no pleural effusion or pneumothorax. IMPRESSION: Increased interstitial markings are seen in parahilar regions and lower lung fields on both sides suggesting possible chronic interstitial lung disease with scarring. Possibility of interstitial pneumonia is not excluded. There is no focal consolidation. There is no pleural effusion or pneumothorax. Electronically Signed   By: Elmer Picker M.D.   On: 09/24/2022 10:01   CT HEAD WO CONTRAST  Result Date: 09/21/2022 CLINICAL DATA:  Headache EXAM: CT HEAD WITHOUT CONTRAST TECHNIQUE: Contiguous axial images were obtained from the base of the skull through the vertex without intravenous contrast. RADIATION DOSE REDUCTION: This exam was performed according to the departmental dose-optimization program which includes automated exposure control, adjustment of the mA and/or kV according to patient size and/or use of iterative reconstruction technique. COMPARISON:  None Available. FINDINGS: Brain: No acute territorial infarction, hemorrhage or intracranial mass. Mild atrophy. Minimal chronic small vessel ischemic changes the white matter. Mildly prominent ventricles felt secondary to atrophy. Vascular: No hyperdense vessels. Vertebral and carotid vascular calcification Skull: Normal. Negative for fracture or focal lesion. Sinuses/Orbits: Mild mucosal thickening in the ethmoid sinuses Other: None IMPRESSION: 1. No CT evidence for acute intracranial abnormality. 2. Mild atrophy and minimal chronic small vessel ischemic changes of white matter. Electronically Signed   By: Donavan Foil M.D.   On: 09/21/2022 17:26     Subjective: No acute issues or events overnight   Discharge Exam: Vitals:   10/01/22 0526 10/01/22 0750  BP: 139/81 131/80  Pulse: 70 70  Resp: 15 16  Temp: 98.7 F (37.1 C) 98.2 F (36.8 C)  SpO2: 92% 95%   Vitals:   09/30/22 1859 10/01/22 0058 10/01/22 0526 10/01/22 0750  BP: (!) 142/85 (!) 140/90 139/81 131/80   Pulse: 95 66 70 70  Resp: 16 17  15 16  Temp: 97.9 F (36.6 C) 98.7 F (37.1 C) 98.7 F (37.1 C) 98.2 F (36.8 C)  TempSrc: Oral Oral Oral Oral  SpO2: 97% 94% 92% 95%  Weight:      Height:        General: Pt is alert, awake, not in acute distress Cardiovascular: RRR, S1/S2 +, no rubs, no gallops Respiratory: CTA bilaterally, no wheezing, no rhonchi Abdominal: Soft, NT, ND, bowel sounds + Extremities: no edema, no cyanosis, weakness and paresthesias improving   The results of significant diagnostics from this hospitalization (including imaging, microbiology, ancillary and laboratory) are listed below for reference.     Microbiology: Recent Results (from the past 240 hour(s))  Gram stain     Status: None   Collection Time: 09/25/22 12:54 PM   Specimen: CSF; Cerebrospinal Fluid  Result Value Ref Range Status   Specimen Description CSF  Final   Special Requests NONE  Final   Gram Stain   Final    WBC PRESENT, PREDOMINANTLY MONONUCLEAR NO ORGANISMS SEEN CYTOSPIN SMEAR Performed at [facility], [address] [place], [place] [address]    Report Status 09/25/2022 FINAL  Final     Labs: BNP (last 3 results) No results for input(s): "BNP" in the last 8760 hours. Basic Metabolic Panel: Recent Labs  Lab 09/24/22 1240  NA 137  K 4.0  CL 104  CO2 23  GLUCOSE 172*  BUN 22  CREATININE 0.98  CALCIUM 8.8*  MG 2.1   Liver Function Tests: Recent Labs  Lab 09/24/22 1240  AST 28  ALT 45*  ALKPHOS 50  BILITOT 0.6  PROT 7.6  ALBUMIN 4.0   Recent Labs  Lab 09/24/22 1240  LIPASE 41   No results for input(s): "AMMONIA" in the last 168 hours. CBC: Recent Labs  Lab 09/24/22 1240  WBC 7.5  NEUTROABS 4.2  HGB 17.1*  HCT 49.3  MCV 96.5  PLT 222   Cardiac Enzymes: No results for input(s): "CKTOTAL", "CKMB", "CKMBINDEX", "TROPONINI" in the last 168 hours. BNP: Invalid input(s): "POCBNP" CBG: No results for input(s): "GLUCAP" in the last 168  hours. D-Dimer No results for input(s): "DDIMER" in the last 72 hours. Hgb A1c No results for input(s): "HGBA1C" in the last 72 hours. Lipid Profile No results for input(s): "CHOL", "HDL", "LDLCALC", "TRIG", "CHOLHDL", "LDLDIRECT" in the last 72 hours. Thyroid function studies No results for input(s): "TSH", "T4TOTAL", "T3FREE", "THYROIDAB" in the last 72 hours.  Invalid input(s): "FREET3" Anemia work up No results for input(s): "VITAMINB12", "FOLATE", "FERRITIN", "TIBC", "IRON", "RETICCTPCT" in the last 72 hours. Urinalysis    Component Value Date/Time   COLORURINE YELLOW 09/24/2022 1240   APPEARANCEUR CLEAR 09/24/2022 1240   LABSPEC 1.025 09/24/2022 1240   PHURINE 6.0 09/24/2022 1240   GLUCOSEU 250 (A) 09/24/2022 1240   GLUCOSEU NEGATIVE 09/02/2020 1112   HGBUR NEGATIVE 09/24/2022 1240   BILIRUBINUR NEGATIVE 09/24/2022 1240   KETONESUR NEGATIVE 09/24/2022 1240   PROTEINUR NEGATIVE 09/24/2022 1240   UROBILINOGEN 0.2 09/02/2020 1112   NITRITE NEGATIVE 09/24/2022 1240   LEUKOCYTESUR NEGATIVE 09/24/2022 1240   Sepsis Labs Recent Labs  Lab 09/24/22 1240  WBC 7.5   Microbiology Recent Results (from the past 240 hour(s))  Gram stain     Status: None   Collection Time: 09/25/22 12:54 PM   Specimen: CSF; Cerebrospinal Fluid  Result Value Ref Range Status   Specimen Description CSF  Final   Special Requests NONE  Final   Gram  Stain   Final    WBC PRESENT, PREDOMINANTLY MONONUCLEAR NO ORGANISMS SEEN CYTOSPIN SMEAR Performed at [facility], [address], [place], [place] [address]    Report Status 09/25/2022 FINAL  Final     Time coordinating discharge: Over 30 minutes  SIGNED:   Azucena Fallen, DO [facility] 10/01/2022, 11:13 AM Pager   If 7PM-7AM, please contact night-coverage [email or web address]

## 2022-10-01 NOTE — Progress Notes (Signed)
  Inpatient Rehabilitation Admissions Coordinator   I met with patient and wife at bedside to discuss estimated cost of care for CIR if approved and options of CIR vs HH vs OP therapy. They would like to consider [facility] or OP therapy. TOC RN CM made aware.   Danne Baxter, RN, MSN Rehab Admissions Coordinator [phone] 10/01/2022 10:40 AM

## 2022-10-01 NOTE — Progress Notes (Signed)
NIF -40. VC 2.7 L with good patient effort.

## 2022-10-02 ENCOUNTER — Telehealth: Payer: Self-pay

## 2022-10-02 NOTE — Telephone Encounter (Signed)
Transition Care Management Follow-up Telephone Call Date of discharge and from where: [facility] 10/01/2022 How have you been since you were released from the hospital? Weak numbness Any questions or concerns? No  Items Reviewed: Did the pt receive and understand the discharge instructions provided? Yes  Medications obtained and verified? Yes  Other? No  Any new allergies since your discharge? No  Dietary orders reviewed? Yes Do you have support at home? Yes   Home Care and Equipment/Supplies: Were home health services ordered? yes If so, what is the name of the agency? [facility]  Has the agency set up a time to come to the patient's home? yes Were any new equipment or medical supplies ordered?  No What is the name of the medical supply agency? N/a Were you able to get the supplies/equipment? yes Do you have any questions related to the use of the equipment or supplies? No  Functional Questionnaire: (I = Independent and D = Dependent) ADLs: I  Bathing/Dressing- D  Meal Prep- D  Eating- I  Maintaining continence- I  Transferring/Ambulation- I  Managing Meds- I  Follow up appointments reviewed:  PCP Hospital f/u appt confirmed? Yes  Scheduled to see Dr Veto Kemps on 10/12/2022 @ 9:20. Specialist Hospital f/u appt confirmed? No   Are transportation arrangements needed? No  If their condition worsens, is the pt aware to call PCP or go to the Emergency Dept.? Yes Was the patient provided with contact information for the PCP's office or ED? Yes Was to pt encouraged to call back with questions or concerns? Yes  Karena Addison, LPN [facility] Nurse Health Advisor Direct Dial [phone]

## 2022-10-03 ENCOUNTER — Telehealth: Payer: Self-pay | Admitting: Family Medicine

## 2022-10-03 NOTE — Telephone Encounter (Signed)
Joe Greene from [facility] is needing verbal orders OT 1x/8w. His vm is secure @ [phone]

## 2022-10-08 ENCOUNTER — Telehealth: Payer: Self-pay | Admitting: Family Medicine

## 2022-10-08 NOTE — Telephone Encounter (Signed)
They requesting occupational therapy for pt   1 time a week for 7 weeks

## 2022-10-08 NOTE — Telephone Encounter (Signed)
Called number and left a detailed VM with okay for OT as described in message. Dm/cma

## 2022-10-08 NOTE — Telephone Encounter (Signed)
Caller Name: Casmer Yepiz Call back phone #: [phone]  Reason for Call: Pt recently was in the hospital (HFU 12/01) and would like a referral for rehabilitation. He has done research and if possible wants to be with [facility]. Please call pt to advise pt if he needs to wait for upcomming appt

## 2022-10-08 NOTE — Telephone Encounter (Signed)
Spoke to patient and moved his appointment up to 10/10/22 @  11:00.  Dm/cma

## 2022-10-08 NOTE — Telephone Encounter (Signed)
Patient's wife is concerned that patient's strength is declining and having labored breathing with activity.  They feel that homeOT isn't enough and would like to get a rehab referral.  He does have an appointment on 10/12/22,  does he need a sooner appointment?  Please review and advise.   Thanks.  Dm/cma

## 2022-10-10 ENCOUNTER — Ambulatory Visit (INDEPENDENT_AMBULATORY_CARE_PROVIDER_SITE_OTHER): Payer: Medicare PPO | Admitting: Family Medicine

## 2022-10-10 ENCOUNTER — Encounter: Payer: Self-pay | Admitting: Family Medicine

## 2022-10-10 VITALS — BP 124/70 | HR 71 | Temp 97.9°F | Ht 70.0 in | Wt 212.0 lb

## 2022-10-10 DIAGNOSIS — R7303 Prediabetes: Secondary | ICD-10-CM

## 2022-10-10 DIAGNOSIS — J439 Emphysema, unspecified: Secondary | ICD-10-CM | POA: Diagnosis not present

## 2022-10-10 DIAGNOSIS — M6281 Muscle weakness (generalized): Secondary | ICD-10-CM | POA: Insufficient documentation

## 2022-10-10 DIAGNOSIS — I10 Essential (primary) hypertension: Secondary | ICD-10-CM

## 2022-10-10 DIAGNOSIS — E781 Pure hyperglyceridemia: Secondary | ICD-10-CM

## 2022-10-10 DIAGNOSIS — I7143 Infrarenal abdominal aortic aneurysm, without rupture: Secondary | ICD-10-CM

## 2022-10-10 LAB — BASIC METABOLIC PANEL
BUN: 24 mg/dL — ABNORMAL HIGH (ref 6–23)
CO2: 27 mEq/L (ref 19–32)
Calcium: 9.7 mg/dL (ref 8.4–10.5)
Chloride: 104 mEq/L (ref 96–112)
Creatinine, Ser: 0.9 mg/dL (ref 0.40–1.50)
GFR: 85.72 mL/min (ref >60.00)
Glucose, Bld: 81 mg/dL (ref 70–99)
Potassium: 4.7 mEq/L (ref 3.5–5.1)
Sodium: 138 mEq/L (ref 135–145)

## 2022-10-10 LAB — CBC
HCT: 46.9 % (ref 39.0–52.0)
Hemoglobin: 16 g/dL (ref 13.0–17.0)
MCHC: 34.1 g/dL (ref 30.0–36.0)
MCV: 97.7 fl (ref 78.0–100.0)
Platelets: 217 10*3/uL (ref 150.0–400.0)
RBC: 4.8 Mil/uL (ref 4.22–5.81)
RDW: 13.6 % (ref 11.5–15.5)
WBC: 7.3 10*3/uL (ref 4.0–10.5)

## 2022-10-10 NOTE — Telephone Encounter (Signed)
Has appointment today @ 11"00. Dm/cma

## 2022-10-10 NOTE — Progress Notes (Signed)
[place] [facility] [address] [place] [address] Dept: [phone] Dept Fax: [facility] Follow-up Visit  Subjective:    Patient ID: Joe Greene, male    DOB: 01-21-1951, 71 y.o..   MRN: [id number]  Chief Complaint  Patient presents with   Hospitalization [facility] f/u.  Wants to get into an out patient rehab ([facility])      History of Present Illness:  Patient is in today for follow-up from his recent hospitalization. Joe Greene was admitted at [facility] from 11/13-11/20/2023. He presented with right leg weakness and ascending numbness of his fingers and toes. He had extensive work up. 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 He was treated with a course of IVIG. He underwent some initial physical therapy. Due to some logistical challenges, the decision was made to discharge him to home, rather than move to inpatient rehab.  Since returning home, Joe Greene notes he has fluctuating weakness in the legs. Today, has been worse than yesterday. He notes that the home PT is not as effective as what he was receiving in the hospital, so he is interested in a referral for outpatient PT/OT. He does have a walker, but was advised that he should have a 4-wheeled walker with a seat. He has a shower chair. He notes he does have trouble with walking far into buildings, so asks about a disabled parking placard. He has a pending follow-upw ith neurology.  Other issues were identified during his hospital stay, including some hyperglycemia with an A1c of 6.5%, a mildly high TSH with a normal T4, elevated blood pressures, and some dyspnea issues. His blood pressure was high enough that he was started on losartan 25 mg daily. He has been trying to quit smoking now in light of his current issues. Additionally, the hospital noted his infrarenal AAA.  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 10/10/2022   Pre-diabetes 09/25/2022   AAA (abdominal aortic aneurysm) ([place]) 09/24/2022   Essential hypertension 09/24/2022   Iron deficiency 04/04/2022   Hypertriglyceridemia 04/03/2022   Coronary artery calcification 01/18/2022   Candidal intertrigo    Emphysema of lung ([place]) 07/26/2021   Aortic atherosclerosis ([place]) 07/26/2021   Internal hemorrhoids 01/23/2021   Diverticula of colon 01/23/2021   Eczema 01/23/2021   Actinic keratoses 01/23/2021   Tobacco use 09/02/2020   Past Surgical History:  Procedure Laterality Date   FOOT SURGERY Right    HERNIA REPAIR Right    KNEE ARTHROSCOPY Left    WISDOM TOOTH EXTRACTION     Family History  Problem Relation Age of Onset   Stroke Mother 40   AAA (abdominal aortic aneurysm) Mother    Cancer Father        Prostate   Colon cancer Neg Hx    Colon polyps Neg Hx    Esophageal cancer Neg Hx    Rectal cancer Neg Hx    Stomach cancer Neg Hx     Outpatient Medications Prior to Visit  Medication Sig Dispense Refill   calcium carbonate (TUMS EX) 750 MG chewable tablet Chew 1,500 mg by mouth 2 (two) times daily as needed for heartburn.     docusate sodium (COLACE) 50 MG capsule Take 50 mg by mouth daily.     ferrous sulfate 325 (65 FE) MG EC tablet Take 325 mg by mouth daily. When able to remember  fluticasone (FLONASE) 50 MCG/ACT nasal spray Place 2 sprays into both nostrils 2 (two) times daily as needed for allergies or rhinitis.     gabapentin (NEURONTIN) 100 MG capsule Take 1 capsule (100 mg total) by mouth 3 (three) times daily. 90 capsule 1   ibuprofen (ADVIL) 200 MG tablet Take 400 mg by mouth 3 (three) times daily as needed for headache or mild pain.     losartan (COZAAR) 25 MG tablet Take 1 tablet (25 mg total) by mouth daily. 30 tablet 1   MAGNESIUM PO Take 1 tablet by mouth daily. When able to remember     Multiple Vitamin (MULTIVITAMIN WITH MINERALS) TABS  tablet Take 1 tablet by mouth daily. 30 tablet 1   nicotine (NICODERM CQ - DOSED IN MG/24 HOURS) 21 mg/24hr patch Place 1 patch (21 mg total) onto the skin daily. 28 patch 0   Omega-3 Fatty Acids (FISH OIL PO) Take 1 capsule by mouth daily. When able to remember     sildenafil (VIAGRA) 100 MG tablet Take 0.5-1 tablets (50-100 mg total) by mouth daily as needed for erectile dysfunction. (Patient taking differently: Take 50 mg by mouth daily as needed for erectile dysfunction.) 5 tablet 11   No facility-administered medications prior to visit.   No Known Allergies    Objective:   Today's Vitals   10/10/22 1049  BP: 124/70  Pulse: 71  Temp: 97.9 F (36.6 C)  TempSrc: Temporal  SpO2: 95%  Weight: 212 lb (96.2 kg)  Height: 5\' 10"  (1.778 m)   Body mass index is 30.42 kg/m.   General: Well developed, well nourished. No acute distress. Neuro: Continued weakness, esp. in proximal leg muscles. Psych: Alert and oriented. Normal mood and affect.  Health Maintenance Due  Topic Date Due   Hepatitis C Screening  Never done   Medicare Annual Wellness (AWV)  11/14/2022     Assessment & Plan:   1. 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 Time will tell as to how well he will respond. He will keep his upcoming appointment with Dr. 01/13/2023 (neurology). I will check follow-up labs as requested by the discharge physician. I will arrange for him to engage in outpatient PT/OT at the [facility]. I will also order a wheeled walker. I will complete a Handicapped Parking form to assist with transportation issues.  - Basic metabolic panel - CBC - Ambulatory referral to Physical Therapy - For home use only DME 4 wheeled rolling walker with seat [facility])  2. Infrarenal abdominal aortic aneurysm (AAA) without rupture (HCC) We discussed that the AAA is not at a size yet to require surgery. However, I will go ahead and refer  him to a vascular surgeon to help with monitoring of this.  - Ambulatory referral to Vascular Surgery  3. Pulmonary emphysema, unspecified emphysema type (HCC) In light of the ongoing dyspnea, I will refer him to pulmonology. I suspect he has some significant COPD, but would like him to have PFTs to help confirm and then to develop a management plan.  - Ambulatory referral to Pulmonology  4. Pre-diabetes We will plan to follow-up with this at a future visit to make sure he has not developed diabetes.  5. Essential hypertension Blood pressure is normal today. Continue losartan 25 mg daily.  6. Hypertriglyceridemia Consider possible addition of fenofibrate to his regimen. We will discuss at his next visit.  Return in about 4 weeks (around 11/07/2022) for Reassessment.  Haydee Salter, MD

## 2022-10-12 ENCOUNTER — Inpatient Hospital Stay: Payer: Medicare PPO | Admitting: Family Medicine

## 2022-10-16 NOTE — Progress Notes (Deleted)
[facility]  PATIENT: Joe Greene DOB: 04-Jan-1951  REFERRING CLINICIAN: Loyola Mast, MD HISTORY FROM: *** REASON FOR VISIT: weakness   HISTORICAL  CHIEF COMPLAINT:  No chief complaint on file.   HISTORY OF PRESENT ILLNESS:  The patient presents for evaluation of headache and right leg weakness. He developed URI symptoms at the end of October 2023, then soon after that he developed a headache. Around the same time he developed paresthesias in his hands and feet. Presented to the ED 09/21/22 where [facility] showed no acute process.  He then developed ascending numbness in his hands and feet. BP was very elevated at home so he re-presented to the ED. He was seen by Neurology and was noted to have lower extremity weakness and loss of reflexes. He was admitted to the hospital with concern for Guillain Barre syndrome. LP showed albumin cytological dissociation.*** MRI brain was unremarkable. He completed 5 days of IVIG.  OTHER MEDICAL CONDITIONS: ***   REVIEW OF SYSTEMS: Full 14 system review of systems performed and negative with exception of: ***  ALLERGIES: No Known Allergies  HOME MEDICATIONS: Outpatient Medications Prior to Visit  Medication Sig Dispense Refill   calcium carbonate (TUMS EX) 750 MG chewable tablet Chew 1,500 mg by mouth 2 (two) times daily as needed for heartburn.     docusate sodium (COLACE) 50 MG capsule Take 50 mg by mouth daily.     ferrous sulfate 325 (65 FE) MG EC tablet Take 325 mg by mouth daily. When able to remember     gabapentin (NEURONTIN) 100 MG capsule Take 1 capsule (100 mg total) by mouth 3 (three) times daily. 90 capsule 1   ibuprofen (ADVIL) 200 MG tablet Take 400 mg by mouth 3 (three) times daily as needed for headache or mild pain.     losartan (COZAAR) 25 MG tablet Take 1 tablet (25 mg total) by mouth daily. 30 tablet 1   MAGNESIUM PO Take 1 tablet by mouth daily. When able to remember     Multiple Vitamin (MULTIVITAMIN  WITH MINERALS) TABS tablet Take 1 tablet by mouth daily. 30 tablet 1   nicotine (NICODERM CQ - DOSED IN MG/24 HOURS) 21 mg/24hr patch Place 1 patch (21 mg total) onto the skin daily. 28 patch 0   Omega-3 Fatty Acids (FISH OIL PO) Take 1 capsule by mouth daily. When able to remember     sildenafil (VIAGRA) 100 MG tablet Take 0.5-1 tablets (50-100 mg total) by mouth daily as needed for erectile dysfunction. (Patient taking differently: Take 50 mg by mouth daily as needed for erectile dysfunction.) 5 tablet 11   No facility-administered medications prior to visit.    PAST MEDICAL HISTORY: Past Medical History:  Diagnosis Date   Cataract    forming     PAST SURGICAL HISTORY: Past Surgical History:  Procedure Laterality Date   FOOT SURGERY Right    HERNIA REPAIR Right    KNEE ARTHROSCOPY Left    WISDOM TOOTH EXTRACTION      FAMILY HISTORY: Family History  Problem Relation Age of Onset   Stroke Mother 74   AAA (abdominal aortic aneurysm) Mother    Cancer Father        Prostate   Colon cancer Neg Hx    Colon polyps Neg Hx    Esophageal cancer Neg Hx    Rectal cancer Neg Hx    Stomach cancer Neg Hx     SOCIAL HISTORY: Social History   Socioeconomic  History   Marital status: Married    Spouse name: Not on file   Number of children: 2   Years of education: Not on file   Highest education level: Not on file  Occupational History   Occupation: Retired    Comment: Scientist, research (medical)- [place]  Tobacco Use   Smoking status: Former    Packs/day: 2.00    Years: 50.00    Total pack years: 100.00    Types: Cigarettes    Quit date: 09/18/2022    Years since quitting: 0.0   Smokeless tobacco: Never  Vaping Use   Vaping Use: Never used  Substance and Sexual Activity   Alcohol use: Yes    Alcohol/week: 5.0 standard drinks of alcohol    Types: 5 Standard drinks or equivalent per week    Comment: occ   Drug use: Never   Sexual activity: Yes  Other Topics Concern   Not on  file  Social History Narrative   Not on file   Social Determinants of Health   Financial Resource Strain: Low Risk  (11/14/2021)   Overall Financial Resource Strain (CARDIA)    Difficulty of Paying Living Expenses: Not hard at all  Food Insecurity: No Food Insecurity (09/25/2022)   Hunger Vital Sign    Worried About Running Out of Food in the Last Year: Never true    Ran Out of Food in the Last Year: Never true  Transportation Needs: No Transportation Needs (09/25/2022)   PRAPARE - Administrator, Civil Service (Medical): No    Lack of Transportation (Non-Medical): No  Physical Activity: Sufficiently Active (11/14/2021)   Exercise Vital Sign    Days of Exercise per Week: 6 days    Minutes of Exercise per Session: 60 min  Stress: No Stress Concern Present (11/14/2021)   [employer] of Occupational Health - Occupational Stress Questionnaire    Feeling of Stress : Not at all  Social Connections: Moderately Integrated (11/14/2021)   Social Connection and Isolation Panel [NHANES]    Frequency of Communication with Friends and Family: Twice a week    Frequency of Social Gatherings with Friends and Family: Twice a week    Attends Religious Services: More than 4 times per year    Active Member of [employer] or Organizations: No    Attends Banker Meetings: Never    Marital Status: Married  Catering manager Violence: Not At Risk (09/25/2022)   Humiliation, Afraid, Rape, and Kick questionnaire    Fear of Current or Ex-Partner: No    Emotionally Abused: No    Physically Abused: No    Sexually Abused: No     PHYSICAL EXAM ***  GENERAL EXAM/CONSTITUTIONAL: Vitals: There were no vitals filed for this visit. There is no height or weight on file to calculate BMI. Wt Readings from Last 3 Encounters:  10/10/22 212 lb (96.2 kg)  09/27/22 208 lb 12.4 oz (94.7 kg)  09/21/22 219 lb (99.3 kg)   Patient is in no distress; well developed, nourished and groomed; neck is  supple  CARDIOVASCULAR: Examination of carotid arteries is normal; no carotid bruits Regular rate and rhythm, no murmurs Examination of peripheral vascular system by observation and palpation is normal  EYES: Pupils round and reactive to light, Visual fields full to confrontation, Extraocular movements intacts,   MUSCULOSKELETAL: Gait, strength, tone, movements noted in Neurologic exam below  NEUROLOGIC: MENTAL STATUS:      No data to display  awake, alert, oriented to person, place and time recent and remote memory intact normal attention and concentration language fluent, comprehension intact, naming intact fund of knowledge appropriate  CRANIAL NERVE:  2nd - no papilledema or hemorrhages on fundoscopic exam 2nd, 3rd, 4th, 6th - pupils equal and reactive to light, visual fields full to confrontation, extraocular muscles intact, no nystagmus 5th - facial sensation symmetric 7th - facial strength symmetric 8th - hearing intact 9th - palate elevates symmetrically, uvula midline 11th - shoulder shrug symmetric 12th - tongue protrusion midline  MOTOR:  normal bulk and tone, full strength in the BUE, BLE  SENSORY:  normal and symmetric to light touch, pinprick, temperature, vibration  COORDINATION:  finger-nose-finger, fine finger movements normal  REFLEXES:  deep tendon reflexes present and symmetric  GAIT/STATION:  normal     DIAGNOSTIC DATA (LABS, IMAGING, TESTING) - I reviewed patient records, labs, notes, testing and imaging myself where available.  Lab Results  Component Value Date   WBC 7.3 10/10/2022   HGB 16.0 10/10/2022   HCT 46.9 10/10/2022   MCV 97.7 10/10/2022   PLT 217.0 10/10/2022      Component Value Date/Time   NA 138 10/10/2022 1146   K 4.7 10/10/2022 1146   CL 104 10/10/2022 1146   CO2 27 10/10/2022 1146   GLUCOSE 81 10/10/2022 1146   BUN 24 (H) 10/10/2022 1146   CREATININE 0.90 10/10/2022 1146   CALCIUM 9.7 10/10/2022  1146   PROT 7.6 09/24/2022 1240   ALBUMIN 4.0 09/24/2022 1240   AST 28 09/24/2022 1240   ALT 45 (H) 09/24/2022 1240   ALKPHOS 50 09/24/2022 1240   BILITOT 0.6 09/24/2022 1240   GFRNONAA >60 09/24/2022 1240   Lab Results  Component Value Date   CHOL 200 09/25/2022   HDL 29 (L) 09/25/2022   LDLCALC UNABLE TO CALCULATE IF TRIGLYCERIDE OVER 400 mg/dL 47/65/4650   LDLDIRECT 103 (H) 09/25/2022   TRIG 499 (H) 09/25/2022   CHOLHDL 6.9 09/25/2022   Lab Results  Component Value Date   HGBA1C 6.5 09/24/2022   Lab Results  Component Value Date   VITAMINB12 196 (L) 09/24/2022   Lab Results  Component Value Date   TSH 6.47 (H) 09/24/2022    ***    ASSESSMENT AND PLAN  71 y.o. year old male with ***   No diagnosis found.    PLAN:   No orders of the defined types were placed in this encounter.   No orders of the defined types were placed in this encounter.   No follow-ups on file.    Ocie Doyne, MD  I spent an average of *** chart reviewing and counseling the patient, with at least 50% of the time face to face with the patient.   [facility] [address], [address] [place], [place] [address] [phone]

## 2022-10-17 ENCOUNTER — Encounter: Payer: Self-pay | Admitting: Vascular Surgery

## 2022-10-17 ENCOUNTER — Ambulatory Visit: Payer: Medicare PPO | Admitting: Vascular Surgery

## 2022-10-17 ENCOUNTER — Inpatient Hospital Stay: Payer: Medicare PPO | Admitting: Psychiatry

## 2022-10-17 VITALS — BP 115/71 | HR 76 | Temp 98.0°F | Resp 20 | Ht 70.0 in | Wt 206.0 lb

## 2022-10-17 DIAGNOSIS — I7143 Infrarenal abdominal aortic aneurysm, without rupture: Secondary | ICD-10-CM | POA: Diagnosis not present

## 2022-10-17 NOTE — Progress Notes (Signed)
Patient ID: Elya Diloreto, male   DOB: 09-Feb-1951, 71 y.o.   MRN: [id number]  Reason for Consult: New Patient (Initial Visit)   Referred by Loyola Mast, MD  Subjective:     HPI:  Amery Minasyan is a 71 y.o. male recently diagnosed with Guillain-Barr syndrome after multiple vaccines for flu as well as COVID-19 and also after a viral illness so unsure of the cause.  During work-up he was found to have abdominal aortic aneurysm.  He states that his mother did have an aneurysm no other known history of aneurysms in his family.  He was a former smoker quit many years ago.  No new back or abdominal pain.  Currently complaining of weakness from his feet all the way up to his proximal thighs and walking with the help of a walker.  Past Medical History:  Diagnosis Date   Cataract    forming    Family History  Problem Relation Age of Onset   Stroke Mother 63   AAA (abdominal aortic aneurysm) Mother    Cancer Father        Prostate   Colon cancer Neg Hx    Colon polyps Neg Hx    Esophageal cancer Neg Hx    Rectal cancer Neg Hx    Stomach cancer Neg Hx    Past Surgical History:  Procedure Laterality Date   FOOT SURGERY Right    HERNIA REPAIR Right    KNEE ARTHROSCOPY Left    WISDOM TOOTH EXTRACTION      Short Social History:  Social History   Tobacco Use   Smoking status: Former    Packs/day: 2.00    Years: 50.00    Total pack years: 100.00    Types: Cigarettes    Quit date: 09/18/2022    Years since quitting: 0.0   Smokeless tobacco: Never  Substance Use Topics   Alcohol use: Yes    Alcohol/week: 5.0 standard drinks of alcohol    Types: 5 Standard drinks or equivalent per week    Comment: occ    No Known Allergies  Current Outpatient Medications  Medication Sig Dispense Refill   calcium carbonate (TUMS EX) 750 MG chewable tablet Chew 1,500 mg by mouth 2 (two) times daily as needed for heartburn.     docusate sodium (COLACE) 50 MG capsule Take 50 mg by mouth daily.      ferrous sulfate 325 (65 FE) MG EC tablet Take 325 mg by mouth daily. When able to remember     gabapentin (NEURONTIN) 100 MG capsule Take 1 capsule (100 mg total) by mouth 3 (three) times daily. 90 capsule 1   ibuprofen (ADVIL) 200 MG tablet Take 400 mg by mouth 3 (three) times daily as needed for headache or mild pain.     losartan (COZAAR) 25 MG tablet Take 1 tablet (25 mg total) by mouth daily. 30 tablet 1   MAGNESIUM PO Take 1 tablet by mouth daily. When able to remember     Multiple Vitamin (MULTIVITAMIN WITH MINERALS) TABS tablet Take 1 tablet by mouth daily. 30 tablet 1   nicotine (NICODERM CQ - DOSED IN MG/24 HOURS) 21 mg/24hr patch Place 1 patch (21 mg total) onto the skin daily. 28 patch 0   Omega-3 Fatty Acids (FISH OIL PO) Take 1 capsule by mouth daily. When able to remember     sildenafil (VIAGRA) 100 MG tablet Take 0.5-1 tablets (50-100 mg total) by mouth daily as needed for erectile dysfunction. (  Patient taking differently: Take 50 mg by mouth daily as needed for erectile dysfunction.) 5 tablet 11   No current facility-administered medications for this visit.    Review of Systems  Constitutional:  Constitutional negative. HENT: HENT negative.  Eyes: Eyes negative.  Respiratory: Respiratory negative.  Cardiovascular: Cardiovascular negative.  GI: Gastrointestinal negative.  Musculoskeletal: Musculoskeletal negative.  Neurological: Positive for focal weakness.  Hematologic: Hematologic/lymphatic negative.  Psychiatric: Psychiatric negative.        Objective:  Objective   Vitals:   10/17/22 1215  BP: 115/71  Pulse: 76  Resp: 20  Temp: 98 F (36.7 C)  SpO2: 93%     Physical Exam HENT:     Head: Normocephalic.     Nose: Nose normal.  Eyes:     Pupils: Pupils are equal, round, and reactive to light.  Cardiovascular:     Rate and Rhythm: Normal rate.     Pulses:          Popliteal pulses are 3+ on the right side and 3+ on the left side.  Pulmonary:      Effort: Pulmonary effort is normal.  Abdominal:     General: Abdomen is flat.     Palpations: Abdomen is soft.  Musculoskeletal:     Cervical back: Normal range of motion and neck supple.     Right lower leg: No edema.     Left lower leg: No edema.  Skin:    General: Skin is warm.     Capillary Refill: Capillary refill takes less than 2 seconds.  Neurological:     General: No focal deficit present.     Mental Status: He is alert.  Psychiatric:        Mood and Affect: Mood normal.        Behavior: Behavior normal.        Thought Content: Thought content normal.        Judgment: Judgment normal.     Data: CTA IMPRESSION: 1. Infrarenal abdominal aortic aneurysm measuring up to 4.6 x 4.6 cm. Recommend follow-up CT/MR every 6 months and vascular consultationThis recommendation follows ACR consensus guidelines: White Paper of the ACR Incidental Findings Committee II on Vascular Findings. J Am Coll Radiol 2013; 10:789-794. 2. No thoracic or abdominal aortic dissection. 3. Mild-to-moderate descending colon diverticulosis. 4. Moderate right and mild left fat containing inguinal hernias.      Assessment/Plan:    71 year old male with recent diagnosis of Guillain-Barr syndrome now with 4.67 abdominal aortic aneurysm.  We discussed the options for treatment as well as the indications.  Given the size he will follow back up in 6 months with duplex.  He also has enlarged popliteal arteries and we will check lower extremity arterial duplexes at that time as well.  All questions were answered.     Maeola Harman MD [facility]

## 2022-10-18 ENCOUNTER — Telehealth: Payer: Self-pay | Admitting: Neurology

## 2022-10-18 ENCOUNTER — Ambulatory Visit: Payer: Medicare PPO | Admitting: Neurology

## 2022-10-18 ENCOUNTER — Encounter: Payer: Self-pay | Admitting: Neurology

## 2022-10-18 VITALS — BP 143/77 | HR 81 | Ht 70.0 in | Wt 208.0 lb

## 2022-10-18 DIAGNOSIS — G610 Guillain-Barre syndrome: Secondary | ICD-10-CM | POA: Insufficient documentation

## 2022-10-18 DIAGNOSIS — R269 Unspecified abnormalities of gait and mobility: Secondary | ICD-10-CM | POA: Diagnosis not present

## 2022-10-18 DIAGNOSIS — E538 Deficiency of other specified B group vitamins: Secondary | ICD-10-CM

## 2022-10-18 MED ORDER — GABAPENTIN 300 MG PO CAPS: 300.0000 mg | ORAL_CAPSULE | Freq: Three times a day (TID) | ORAL | 11 refills | Status: DC

## 2022-10-18 NOTE — Telephone Encounter (Signed)
Add him to 1pm on Dec 13, ok to [employer]

## 2022-10-18 NOTE — Progress Notes (Signed)
Chief Complaint  Patient presents with   Hospitalization Follow-up    Rm 12. Accompanied by wife. NP ED referral for headache and RLE weakness,Guillain-Barr.      ASSESSMENT AND PLAN  Joe Greene is a 71 y.o. male   Guillain-Barr syndrome  Started since early November 2023 following upper respiratory infection, loss of taste 2 weeks prior to the incident, mild to moderate improvement following IVIG from November 14-18 2023, began to have worsening symptoms again since early December 2023,  Will restart IVIG loading dose 2 g/kg followed by maintenance 1 g/kg every 3 weeks, started prior authorization today  Laboratory evaluations  EMG nerve conduction study   Is getting rehab through neurorehab now    DIAGNOSTIC DATA (LABS, IMAGING, TESTING) - I reviewed patient records, labs, notes, testing and imaging myself where available.  MEDICAL HISTORY:  Joe Greene, is a 71 year old male seen in request by his primary care physician Dr. 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 PMHX HTN Smoker, 2ppd, quit since Sep 23 2022.  Patient has been active all his life, began to noticed toes and fingertips numbness tingling on September 17, 2022, prior to that, he has suffered upper respiratory infection for 2 weeks, cough, loss of taste,  By November 10, he noticed progressive numbness upper and lower extremity, and also developed gait abnormality,  Symptoms continue to progressively getting worse, leading to emergency room presentation September 24, 2022, also had intractable headache,  He was admitted with diagnosis of probable Guillain-Barr, lumbar puncture September 25, 2022 showed wbc, 4, RBC 0,  TP 89, Glucose 80,   He had 5 days of IVIG, began to notice improvement shortly afterwards, continue to make progress, to the point he can ambulate  without assistance at home for few days, but since early December, he noticed regression, increased numbness, gait abnormality,    PHYSICAL EXAM:   Vitals:   10/18/22 0924  BP: (!) 143/77  Pulse: 81  Weight: 208 lb (94.3 kg)  Height: 5\' 10"  (1.778 m)   Not recorded     Body mass index is 29.84 kg/m.  PHYSICAL EXAMNIATION:  Gen: NAD, conversant, well nourised, well groomed                     Cardiovascular: Regular rate rhythm, no peripheral edema, warm, nontender. Eyes: Conjunctivae clear without exudates or hemorrhage Neck: Supple, no carotid bruits. Pulmonary: Clear to auscultation bilaterally   NEUROLOGICAL EXAM:  MENTAL STATUS: Speech/cognition: Awake, alert, oriented to history taking and casual conversation CRANIAL NERVES: CN II: Visual fields are full to confrontation. Pupils are round equal and briskly reactive to light. CN III, IV, VI: extraocular movement are normal. No ptosis. CN V: Facial sensation is intact to light touch CN VII: Face is symmetric with normal eye closure  CN VIII: Hearing is normal to causal conversation. CN IX, X: Phonation is normal. CN XI: Head turning and shoulder shrug are intact  MOTOR: Mild bilateral shoulder abduction, external rotation weakness, mild bilateral hands grip finger abduction weakness.  Mild bilateral hip flexion, moderate bilateral ankle dorsiflexion, plantarflexion weakness  REFLEXES: Areflexia  SENSORY:  Length-dependent decreased light touch, pinprick sensation to mid shin level, absent vibratory sensation left lower extremity, absent toe and ankle proprioception,  Preserved finger proprioception, and decreased vibratory sense at fingertips  COORDINATION: There is no trunk or limb  dysmetria noted.  GAIT/STANCE: Need push-up to get up from sitting position, wide-based, rely on his walker, bilateral distal weakness, cautious, positive Romberg signs,  REVIEW OF SYSTEMS:  Full 14 system review of systems  performed and notable only for as above All other review of systems were negative.   ALLERGIES: No Known Allergies  HOME MEDICATIONS: Current Outpatient Medications  Medication Sig Dispense Refill   calcium carbonate (TUMS EX) 750 MG chewable tablet Chew 1,500 mg by mouth 2 (two) times daily as needed for heartburn.     docusate sodium (COLACE) 50 MG capsule Take 50 mg by mouth daily.     ferrous sulfate 325 (65 FE) MG EC tablet Take 325 mg by mouth daily. When able to remember     gabapentin (NEURONTIN) 100 MG capsule Take 1 capsule (100 mg total) by mouth 3 (three) times daily. 90 capsule 1   ibuprofen (ADVIL) 200 MG tablet Take 400 mg by mouth 3 (three) times daily as needed for headache or mild pain.     losartan (COZAAR) 25 MG tablet Take 1 tablet (25 mg total) by mouth daily. 30 tablet 1   MAGNESIUM PO Take 1 tablet by mouth daily. When able to remember     Multiple Vitamin (MULTIVITAMIN WITH MINERALS) TABS tablet Take 1 tablet by mouth daily. 30 tablet 1   nicotine (NICODERM CQ - DOSED IN MG/24 HOURS) 21 mg/24hr patch Place 1 patch (21 mg total) onto the skin daily. 28 patch 0   Omega-3 Fatty Acids (FISH OIL PO) Take 1 capsule by mouth daily. When able to remember     sildenafil (VIAGRA) 100 MG tablet Take 0.5-1 tablets (50-100 mg total) by mouth daily as needed for erectile dysfunction. (Patient taking differently: Take 50 mg by mouth daily as needed for erectile dysfunction.) 5 tablet 11   No current facility-administered medications for this visit.    PAST MEDICAL HISTORY: Past Medical History:  Diagnosis Date   AAA (abdominal aortic aneurysm) (HCC)    Cataract    forming     PAST SURGICAL HISTORY: Past Surgical History:  Procedure Laterality Date   FOOT SURGERY Right    HERNIA REPAIR Right    KNEE ARTHROSCOPY Left    WISDOM TOOTH EXTRACTION      FAMILY HISTORY: Family History  Problem Relation Age of Onset   Stroke Mother 62   AAA (abdominal aortic aneurysm)  Mother    Cancer Father        Prostate   Colon cancer Neg Hx    Colon polyps Neg Hx    Esophageal cancer Neg Hx    Rectal cancer Neg Hx    Stomach cancer Neg Hx     SOCIAL HISTORY: Social History   Socioeconomic History   Marital status: Married    Spouse name: Not on file   Number of children: 2   Years of education: Not on file   Highest education level: Not on file  Occupational History   Occupation: Retired    Comment: Scientist, research (medical)- [employer]  Tobacco Use   Smoking status: Former    Packs/day: 2.00    Years: 50.00    Total pack years: 100.00    Types: Cigarettes    Quit date: 09/18/2022    Years since quitting: 0.0   Smokeless tobacco: Never  Vaping Use   Vaping Use: Never used  Substance and Sexual Activity   Alcohol use: Yes    Alcohol/week: 5.0 standard drinks of  alcohol    Types: 5 Standard drinks or equivalent per week    Comment: occ   Drug use: Never   Sexual activity: Yes  Other Topics Concern   Not on file  Social History Narrative   Not on file   Social Determinants of Health   Financial Resource Strain: Low Risk  (11/14/2021)   Overall Financial Resource Strain (CARDIA)    Difficulty of Paying Living Expenses: Not hard at all  Food Insecurity: No Food Insecurity (09/25/2022)   Hunger Vital Sign    Worried About Running Out of Food in the Last Year: Never true    Ran Out of Food in the Last Year: Never true  Transportation Needs: No Transportation Needs (09/25/2022)   PRAPARE - Administrator, Civil Service (Medical): No    Lack of Transportation (Non-Medical): No  Physical Activity: Sufficiently Active (11/14/2021)   Exercise Vital Sign    Days of Exercise per Week: 6 days    Minutes of Exercise per Session: 60 min  Stress: No Stress Concern Present (11/14/2021)   [employer] of Occupational Health - Occupational Stress Questionnaire    Feeling of Stress : Not at all  Social Connections: Moderately Integrated (11/14/2021)    Social Connection and Isolation Panel [NHANES]    Frequency of Communication with Friends and Family: Twice a week    Frequency of Social Gatherings with Friends and Family: Twice a week    Attends Religious Services: More than 4 times per year    Active Member of [employer] or Organizations: No    Attends Banker Meetings: Never    Marital Status: Married  Catering manager Violence: Not At Risk (09/25/2022)   Humiliation, Afraid, Rape, and Kick questionnaire    Fear of Current or Ex-Partner: No    Emotionally Abused: No    Physically Abused: No    Sexually Abused: No      Levert Feinstein, M.D. Ph.D.  [facility] [address], [address] [place], [place] [address] Ph: [phone] Fax: [phone]  CC:  Loyola Mast, MD [address] [place],  [place] [address]  Loyola Mast, MD

## 2022-10-18 NOTE — Telephone Encounter (Signed)
Pt is scheduled for 01/11/23 for NCV/EMG with Dr. Terrace Arabia. Pt is unhappy with having to wait that long to have this done. Would it be possible to have pt worked in sooner? If not, he is requesting to be referred somewhere that could see him sooner

## 2022-10-19 ENCOUNTER — Ambulatory Visit: Payer: Medicare PPO | Attending: Family Medicine | Admitting: Physical Therapy

## 2022-10-19 ENCOUNTER — Telehealth: Payer: Self-pay | Admitting: Physical Therapy

## 2022-10-19 DIAGNOSIS — R2689 Other abnormalities of gait and mobility: Secondary | ICD-10-CM | POA: Diagnosis not present

## 2022-10-19 DIAGNOSIS — R2681 Unsteadiness on feet: Secondary | ICD-10-CM | POA: Diagnosis not present

## 2022-10-19 DIAGNOSIS — M6281 Muscle weakness (generalized): Secondary | ICD-10-CM | POA: Insufficient documentation

## 2022-10-19 NOTE — Therapy (Signed)
OUTPATIENT PHYSICAL THERAPY NEURO EVALUATION   Patient Name: Joe Greene MRN: [id number] DOB:08-23-1951, 71 y.o., male Today's Date: 10/19/2022   PCP: Joe Greene, Joe M, MD REFERRING PROVIDER: Loyola Greene, Joe M, MD  END OF SESSION:  PT End of Session - 10/19/22 0926     Visit Number 1    Number of Visits 25   with eval   Date for PT Re-Evaluation 01/11/23    Authorization Type [employer]    Progress Note Due on Visit 10    PT Start Time [phone]    PT Stop Time 1000   eval   PT Time Calculation (min) 37 min    Equipment Utilized During Treatment Gait belt    Activity Tolerance Patient limited by fatigue    Behavior During Therapy WFL for tasks assessed/performed             Past Medical History:  Diagnosis Date   AAA (abdominal aortic aneurysm) (HCC)    Cataract    forming    Past Surgical History:  Procedure Laterality Date   FOOT SURGERY Right    HERNIA REPAIR Right    KNEE ARTHROSCOPY Left    WISDOM TOOTH EXTRACTION     Patient Active Problem List   Diagnosis Date Noted   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/2022   B12 deficiency 10/18/2022   Gait abnormality 10/18/2022   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 10/10/2022   Pre-diabetes 09/25/2022   AAA (abdominal aortic aneurysm) (HCC) 09/24/2022   Essential hypertension 09/24/2022   Iron deficiency 04/04/2022   Hypertriglyceridemia 04/03/2022   Coronary artery calcification 01/18/2022   Candidal intertrigo    Emphysema of lung (HCC) 07/26/2021   Aortic atherosclerosis (HCC) 07/26/2021   Internal hemorrhoids 01/23/2021   Diverticula of colon 01/23/2021   Eczema 01/23/2021   Actinic keratoses 01/23/2021   Tobacco use 09/02/2020    ONSET DATE: 10/10/2022  REFERRING DIAG: M62.81 (ICD-10-CM) - Proximal muscle weakness  THERAPY DIAG:  Muscle weakness (generalized)  Other abnormalities of gait and mobility  Unsteadiness on feet  Rationale for Evaluation and  Treatment: Rehabilitation  SUBJECTIVE:                                                                                                                                                                                             SUBJECTIVE STATEMENT: Pt presents to outpatient therapy having just d/c from home health therapy services yesterday. Pt recounts diagnosis of GBS, had a hospital stay from Nov 13th to 20th. Pt reports he progressed well after d/c home and was not using a RW but on 12/1 he experienced  a rapid decline in function. Pt reports he has ongoing numbness and weakness in BUE and BLE, recently had an increase in his gabapentin which has improved the numbness but his legs remain very weak. Pt also reports a tingling sensation in his hands and does feel that he has decreased UB strength. Pt reports he received 5 IVIG treatments while in the hospital with plans to receive more in the future on an outpatient basis. Pt also reports he has a nerve conduction study and EMG scheduled at [facility] Wed 12[facility]13. Not related to this diagnosis but pt also reports chronic cranial nerve pain down L side of his neck, treats it with heat. Pt also reports chronic low back pain.   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   Pt accompanied by: self and significant other wife [name]  PERTINENT HISTORY: from chart: Patient is in today for follow-up from his recent hospitalization. Mr. Chain was admitted at [facility] from 11/13-11/20/2023. He presented with right leg weakness and ascending numbness of his fingers and toes. He had extensive work up. 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 He was treated with a course of IVIG. He underwent some initial physical therapy. Due to some logistical challenges, the  decision was made to discharge him to home, rather than move to inpatient rehab.   Since returning home, Mr. Ander notes he has fluctuating weakness in the legs. Today, has been worse than yesterday. He notes that the home PT is not as effective as what he was receiving in the hospital, so he is interested in a referral for outpatient PT/OT. He does have a walker, but was advised that he should have a 4-wheeled walker with a seat. He has a shower chair. He notes he does have trouble with walking far into buildings, so asks about a disabled parking placard. He has a pending follow-upw ith neurology.   Other issues were identified during his hospital stay, including some hyperglycemia with an A1c of 6.5%, a mildly high TSH with a normal T4, elevated blood pressures, and some dyspnea issues. His blood pressure was high enough that he was started on losartan 25 mg daily. He has been trying to quit smoking now in light of his current issues. Additionally, the hospital noted his infrarenal AAA.  No PMH on file  PAIN:  Are you having pain? Yes: NPRS scale: 4/10 Pain location: BLE Pain description: fatigued, numbness Aggravating factors: movement, exercise Relieving factors: rest  PRECAUTIONS: Fall  WEIGHT BEARING RESTRICTIONS: No  FALLS: Has patient fallen in last 6 months? Yes. Number of falls fell 12/7 on 2 STE the house, LLE buckled, did not get injured  LIVING ENVIRONMENT: Lives with: lives with their spouse Lives in: House/apartment Stairs: Yes: External: 2 steps; none Has following equipment at home: Walker - 2 wheeled and plans to rent w/c for one month; potentially thinking about rollator but not safe enough to use one just yet  PLOF: Independent with gait and Independent with transfers  PATIENT GOALS: "get back to 100%"  OBJECTIVE:   DIAGNOSTIC FINDINGS: none relevant to this POC, EMG and nerve conduction study scheduled for 10/24/22 with [facility]  COGNITION: Overall cognitive status:  Within functional limits for tasks assessed   SENSATION: Light touch: Impaired  Proprioception: Impaired  Impaired distally>proximally  COORDINATION: Decreased speed of movements BLE  POSTURE: rounded shoulders and forward head   (Blank rows = not tested)  LOWER EXTREMITY MMT:    MMT Right Eval Left Eval  Hip flexion 4 4  Hip extension    Hip abduction    Hip adduction    Hip internal rotation    Hip external rotation    Knee flexion 3 3  Knee extension 4 4  Ankle dorsiflexion 5 4  Ankle plantarflexion    Ankle inversion    Ankle eversion    (Blank rows = not tested)  BED MOBILITY:  Mod I with assist from RW  TRANSFERS: Assistive device utilized: Environmental consultant - 2 wheeled  Sit to stand: CGA Stand to sit: CGA Chair to chair: CGA Floor:  not assessed at eval   GAIT: Gait pattern: decreased hip/knee flexion- Right, decreased hip/knee flexion- Left, and trunk flexed Distance walked: various clinic distances Assistive device utilized: Environmental consultant - 2 wheeled Level of assistance: Min A Comments: fatigues very quickly after ambulating short distances  FUNCTIONAL TESTS:    [facility] PT Assessment - 10/19/22 0942       Ambulation/Gait   Gait velocity 32.8 ft over 13.25 sec = 2.48 ft/sec      Standardized Balance Assessment   Standardized Balance Assessment Five Times Sit to Stand;Timed Up and Go Test    Five times sit to stand comments  22 sec   use of RW, CGA, 9/10 RPE     Timed Up and Go Test   TUG Normal TUG    Normal TUG (seconds) 15.97   RW and min A            TODAY'S TREATMENT:                                                                                                                               PT Evaluation    PATIENT EDUCATION: Education details: Eval findings, POC, GBS and typical recovery timeline Person educated: Patient and Spouse Education method: Medical illustrator Education comprehension: verbalized understanding, returned  demonstration, and needs further education  HOME EXERCISE PROGRAM: To be established next session  GOALS: Goals reviewed with patient? Yes  SHORT TERM GOALS: Target date: 11/16/2022  Pt will be independent with initial HEP for improved strength, balance, transfers and gait. Baseline: Goal status: INITIAL  2.  Pt will improve gait velocity to at least 2.75 ft/sec for improved gait efficiency and performance at CGA level  Baseline: 2.48 ft/sec with min A and RW (12/8) Goal status: INITIAL  3.  vs to be assessed and STG written Baseline:  Goal status: INITIAL   LONG TERM GOALS: Target date: 12/14/2022  Pt will be independent with final HEP for improved strength, balance, transfers and gait. Baseline:  Goal status: INITIAL  2. Pt will improve gait velocity to at least 3.0 ft/sec for improved gait efficiency and performance at  Supervision level  Baseline: 2.48 ft/sec with min A and RW (12/8) Goal status: INITIAL  3.  Pt will improve 5 x STS to less than or equal to 15 seconds to demonstrate improved functional strength and transfer efficiency.  Baseline: 22 sec with BUE support, RW, CGA (12/8) Goal status: INITIAL  4.  Pt will improve normal TUG to less than or equal to 13 seconds for improved functional mobility and decreased fall risk. Baseline: 15.97 sec (12/8) Goal status: INITIAL  5.  vs to be assessed and LTG written Baseline:  Goal status: INITIAL   ASSESSMENT:  CLINICAL IMPRESSION: Patient is a 71 year old male referred to Neuro OPPT for proximal muscle weakness due to GBS.   Pt's PMH is significant for: none on file. The following deficits were present during the exam: decreased BLE light touch sensation and proprioception, decreased BLE strength, decreased gait speed, decreased balance, and decreased endurance. Based on his gait speed of 2.48 ft/sec, 5xSTS score of 22 sec, and TUG score of 15.97 sec as well as fall history and sensory and motor  impairments of his BLE, pt is an increased risk for falls. Pt would benefit from skilled PT to address these impairments and functional limitations to maximize functional mobility independence.   OBJECTIVE IMPAIRMENTS: Abnormal gait, cardiopulmonary status limiting activity, decreased activity tolerance, decreased balance, decreased endurance, decreased knowledge of condition, decreased mobility, difficulty walking, decreased strength, impaired perceived functional ability, impaired sensation, and pain.   ACTIVITY LIMITATIONS: carrying, lifting, bending, sitting, standing, squatting, stairs, and transfers  PARTICIPATION LIMITATIONS: driving, community activity, occupation, and yard work  PERSONAL FACTORS: Age are also affecting patient's functional outcome.   REHAB POTENTIAL: Good  CLINICAL DECISION MAKING: Stable/uncomplicated  EVALUATION COMPLEXITY: Moderate  PLAN:  PT FREQUENCY: 2x/week  PT DURATION: 12 weeks  PLANNED INTERVENTIONS: Therapeutic exercises, Therapeutic activity, Neuromuscular re-education, Balance training, Gait training, Patient/Family education, Self Care, Joint mobilization, Stair training, Vestibular training, Canalith repositioning, Visual/preceptual remediation/compensation, Orthotic/Fit training, DME instructions, Aquatic Therapy, Dry Needling, Electrical stimulation, Wheelchair mobility training, Spinal manipulation, Spinal mobilization, Cryotherapy, Moist heat, Taping, Manual therapy, and Re-evaluation  PLAN FOR NEXT SESSION: assess vs and write STG/LTG, initiate HEP for BLE strengthening and endurance, is referral for OT placed yet?   Peter Congo, PT, DPT, CSRS 10/19/2022, 10:07 AM

## 2022-10-19 NOTE — Telephone Encounter (Signed)
Dr. Veto Kemps, [name] was evaluated by Physical Therapy on 10/19/22.  The patient would benefit from an Occupational Therapy evaluation for upper body weakness and decreased grip strength.   If you agree, please place an order in [facility] workque in [facility] or fax the order to [phone]. Thank you, Peter Congo, PT, DPT, [facility] [address] [place], [place]  [address] Phone:  [phone] Fax:  [phone]

## 2022-10-22 ENCOUNTER — Encounter: Payer: Self-pay | Admitting: Physical Therapy

## 2022-10-22 ENCOUNTER — Ambulatory Visit: Payer: Medicare PPO | Admitting: Physical Therapy

## 2022-10-22 DIAGNOSIS — K5730 Diverticulosis of large intestine without perforation or abscess without bleeding: Secondary | ICD-10-CM | POA: Diagnosis not present

## 2022-10-22 DIAGNOSIS — R0602 Shortness of breath: Secondary | ICD-10-CM | POA: Diagnosis not present

## 2022-10-22 DIAGNOSIS — J439 Emphysema, unspecified: Secondary | ICD-10-CM | POA: Diagnosis present

## 2022-10-22 DIAGNOSIS — R2689 Other abnormalities of gait and mobility: Secondary | ICD-10-CM

## 2022-10-22 DIAGNOSIS — I714 Abdominal aortic aneurysm, without rupture, unspecified: Secondary | ICD-10-CM | POA: Diagnosis present

## 2022-10-22 DIAGNOSIS — R7989 Other specified abnormal findings of blood chemistry: Secondary | ICD-10-CM | POA: Diagnosis not present

## 2022-10-22 DIAGNOSIS — G6181 Chronic inflammatory demyelinating polyneuritis: Secondary | ICD-10-CM | POA: Diagnosis present

## 2022-10-22 DIAGNOSIS — E781 Pure hyperglyceridemia: Secondary | ICD-10-CM | POA: Diagnosis not present

## 2022-10-22 DIAGNOSIS — J438 Other emphysema: Secondary | ICD-10-CM | POA: Diagnosis not present

## 2022-10-22 DIAGNOSIS — Z452 Encounter for adjustment and management of vascular access device: Secondary | ICD-10-CM | POA: Diagnosis not present

## 2022-10-22 DIAGNOSIS — R531 Weakness: Secondary | ICD-10-CM | POA: Diagnosis not present

## 2022-10-22 DIAGNOSIS — R110 Nausea: Secondary | ICD-10-CM | POA: Diagnosis not present

## 2022-10-22 DIAGNOSIS — Z79899 Other long term (current) drug therapy: Secondary | ICD-10-CM | POA: Diagnosis not present

## 2022-10-22 DIAGNOSIS — I10 Essential (primary) hypertension: Secondary | ICD-10-CM | POA: Diagnosis present

## 2022-10-22 DIAGNOSIS — M6281 Muscle weakness (generalized): Secondary | ICD-10-CM

## 2022-10-22 DIAGNOSIS — G610 Guillain-Barre syndrome: Secondary | ICD-10-CM | POA: Diagnosis present

## 2022-10-22 DIAGNOSIS — R5381 Other malaise: Secondary | ICD-10-CM | POA: Diagnosis not present

## 2022-10-22 DIAGNOSIS — E538 Deficiency of other specified B group vitamins: Secondary | ICD-10-CM | POA: Diagnosis not present

## 2022-10-22 DIAGNOSIS — R339 Retention of urine, unspecified: Secondary | ICD-10-CM | POA: Diagnosis present

## 2022-10-22 DIAGNOSIS — R7303 Prediabetes: Secondary | ICD-10-CM | POA: Diagnosis not present

## 2022-10-22 DIAGNOSIS — Z823 Family history of stroke: Secondary | ICD-10-CM | POA: Diagnosis not present

## 2022-10-22 DIAGNOSIS — R2681 Unsteadiness on feet: Secondary | ICD-10-CM

## 2022-10-22 DIAGNOSIS — I959 Hypotension, unspecified: Secondary | ICD-10-CM | POA: Diagnosis not present

## 2022-10-22 DIAGNOSIS — I7143 Infrarenal abdominal aortic aneurysm, without rupture: Secondary | ICD-10-CM | POA: Diagnosis not present

## 2022-10-22 DIAGNOSIS — K4020 Bilateral inguinal hernia, without obstruction or gangrene, not specified as recurrent: Secondary | ICD-10-CM | POA: Diagnosis not present

## 2022-10-22 DIAGNOSIS — M47816 Spondylosis without myelopathy or radiculopathy, lumbar region: Secondary | ICD-10-CM | POA: Diagnosis not present

## 2022-10-22 DIAGNOSIS — Z87891 Personal history of nicotine dependence: Secondary | ICD-10-CM | POA: Diagnosis not present

## 2022-10-22 DIAGNOSIS — I700 Atherosclerosis of aorta: Secondary | ICD-10-CM | POA: Diagnosis not present

## 2022-10-22 DIAGNOSIS — F17210 Nicotine dependence, cigarettes, uncomplicated: Secondary | ICD-10-CM | POA: Diagnosis not present

## 2022-10-22 DIAGNOSIS — M47812 Spondylosis without myelopathy or radiculopathy, cervical region: Secondary | ICD-10-CM | POA: Diagnosis not present

## 2022-10-22 DIAGNOSIS — E611 Iron deficiency: Secondary | ICD-10-CM | POA: Diagnosis present

## 2022-10-22 DIAGNOSIS — E119 Type 2 diabetes mellitus without complications: Secondary | ICD-10-CM | POA: Diagnosis not present

## 2022-10-22 NOTE — Therapy (Addendum)
OUTPATIENT PHYSICAL THERAPY NEURO TREATMENT   Patient Name: Joe Greene MRN: [id number] DOB:04/08/1951, 71 y.o., male Today's Date: 10/22/2022   PCP: Loyola Mast, MD REFERRING PROVIDER: Loyola Mast, MD  END OF SESSION:  PT End of Session - 10/22/22 1015     Visit Number 2    Number of Visits 25    Date for PT Re-Evaluation 01/11/23    Authorization Type [employer]    Progress Note Due on Visit 10    PT Start Time 1015    PT Stop Time 1100    PT Time Calculation (min) 45 min    Equipment Utilized During Treatment Gait belt    Activity Tolerance Patient limited by fatigue    Behavior During Therapy Anxious              Past Medical History:  Diagnosis Date   AAA (abdominal aortic aneurysm) (HCC)    Cataract    forming    Past Surgical History:  Procedure Laterality Date   FOOT SURGERY Right    HERNIA REPAIR Right    KNEE ARTHROSCOPY Left    WISDOM TOOTH EXTRACTION     Patient Active Problem List   Diagnosis Date Noted   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/2022   B12 deficiency 10/18/2022   Gait abnormality 10/18/2022   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 10/10/2022   Pre-diabetes 09/25/2022   AAA (abdominal aortic aneurysm) (HCC) 09/24/2022   Essential hypertension 09/24/2022   Iron deficiency 04/04/2022   Hypertriglyceridemia 04/03/2022   Coronary artery calcification 01/18/2022   Candidal intertrigo    Emphysema of lung (HCC) 07/26/2021   Aortic atherosclerosis (HCC) 07/26/2021   Internal hemorrhoids 01/23/2021   Diverticula of colon 01/23/2021   Eczema 01/23/2021   Actinic keratoses 01/23/2021   Tobacco use 09/02/2020    ONSET DATE: 10/10/2022  REFERRING DIAG: M62.81 (ICD-10-CM) - Proximal muscle weakness  THERAPY DIAG:  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   PAIN:  Are you having pain? Yes: NPRS scale: 7/10 Pain location: BLE Pain description: fatigued, numbness Aggravating factors: movement, exercise Relieving factors:  rest  PRECAUTIONS: Fall  WEIGHT BEARING RESTRICTIONS: No  FALLS: Has patient fallen in last 6 months? Yes. Number of falls fell 12/7 on 2 STE the house, LLE buckled, did not get injured  PATIENT GOALS: "get back to 100%"  OBJECTIVE:   TODAY'S TREATMENT:                                                                                                                                Vitals at beginning of session: SP096%, HR 70bpm, BP 93/58 (manually 98/62)  After 2 min walk BP 112/80  [facility] Adult PT Treatment/Exercise - 10/22/22 0001       Ambulation/Gait   Ambulation/Gait Yes    Ambulation/Gait Assistance 4: Min assist   min guard to min A due to LOB x1 with feet catching on floor.   Ambulation/Gait Assistance Details 2 MWT no seated rest breaks, LOB x1 with min A to correct    Ambulation Distance (Feet) 249.7 Feet    Assistive device Rolling walker    Gait Pattern Step-through pattern;Decreased hip/knee flexion - left;Right foot flat;Left foot flat;Trunk flexed;Wide base of support   LEs stepping into external rotation.       Pt performed LE strengthening and standing exercises at counter, see HEP below, with cues for technique, and with seated rest breaks. Pt required UE support, close supervision - Min A due to LOB with upper trunk rotations. Pt performed x5 1-2 reps each.  PATIENT  EDUCATION: Education details: initial HEP; encouraged pt to schedule for OT eval since MD order was signed. Person educated: Patient and Spouse Education method: Medical illustrator Education comprehension: verbalized understanding, returned demonstration, and needs further education  HOME EXERCISE PROGRAM:  Access Code: [email or web address] URL: [email or web address] Date: 10/22/2022 Prepared by: Waldon Merl  Exercises - Standing March with Counter Support  - 1-2 x daily - 5 x weekly - 1-2 sets - 5-10 reps - 3 hold - Standing Thoracic and Cervical Rotation with Reach at Wall  - 1-2 x daily - 5 x weekly - 1-2 sets - 3-5 reps - 5 hold - Mini Squat with Counter Support  - 1-2 x daily - 5 x weekly - 1-2 sets - 5-10 reps - 3 hold  GOALS: Goals reviewed with patient? Yes  SHORT TERM GOALS: Target date: 11/16/2022  Pt will be independent with initial HEP for improved strength, balance, transfers and gait. Baseline: Goal status: INITIAL  2.  Pt will improve gait velocity to at least 2.75 ft/sec for improved gait efficiency and performance at CGA level  Baseline: 2.48 ft/sec with min A and RW (12/8) Goal status: INITIAL  3.  Pt will ambulate greater than or equal to 300 feet on with LRAD and Supervision for improved cardiovascular endurance and BLE strength.  Baseline: 249.7 ft with RW and min A (12/11) Goal status: INITIAL   LONG TERM GOALS: Target date: 12/14/2022  Pt will be independent with final  HEP for improved strength, balance, transfers and gait. Baseline:  Goal status: INITIAL  2. Pt will improve gait velocity to at least 3.0 ft/sec for improved gait efficiency and performance at Supervision level  Baseline: 2.48 ft/sec with min A and RW (12/8) Goal status: INITIAL  3.  Pt will improve 5 x STS to less than or equal to 15 seconds to demonstrate improved functional strength and transfer efficiency.  Baseline: 22 sec with BUE support, RW, CGA (12/8) Goal  status: INITIAL  4.  Pt will improve normal TUG to less than or equal to 13 seconds for improved functional mobility and decreased fall risk. Baseline: 15.97 sec (12/8) Goal status: INITIAL  5.  Pt will ambulate greater than or equal to 350 feet on with LRAD and Supervision for improved cardiovascular endurance and BLE strength.  Baseline: 249.7 ft with RW and min A (12/11) Goal status: INITIAL   ASSESSMENT:  CLINICAL IMPRESSION: Pt walked 249.3ft for 2 min walk test with rollator requiring min guard to min A.  Pt was able  to perform HEP exercises but required seated rest breaks and was limited by fatigue.   OBJECTIVE IMPAIRMENTS: Abnormal gait, cardiopulmonary status limiting activity, decreased activity tolerance, decreased balance, decreased endurance, decreased knowledge of condition, decreased mobility, difficulty walking, decreased strength, impaired perceived functional ability, impaired sensation, and pain.   ACTIVITY LIMITATIONS: carrying, lifting, bending, sitting, standing, squatting, stairs, and transfers  PARTICIPATION LIMITATIONS: driving, community activity, occupation, and yard work  PERSONAL FACTORS: Age are also affecting patient's functional outcome.   REHAB POTENTIAL: Good  CLINICAL DECISION MAKING: Stable/uncomplicated  EVALUATION COMPLEXITY: Moderate  PLAN:  PT FREQUENCY: 2x/week  PT DURATION: 12 weeks  PLANNED INTERVENTIONS: Therapeutic exercises, Therapeutic activity, Neuromuscular re-education, Balance training, Gait training, Patient/Family education, Self Care, Joint mobilization, Stair training, Vestibular training, Canalith repositioning, Visual/preceptual remediation/compensation, Orthotic/Fit training, DME instructions, Aquatic Therapy, Dry Needling, Electrical stimulation, Wheelchair mobility training, Spinal manipulation, Spinal mobilization, Cryotherapy, Moist heat, Taping, Manual therapy, and Re-evaluation  PLAN FOR NEXT SESSION: check  and progress HEP for BLE strengthening and endurance,  Hortencia Conradi, PTA  Peter Congo, PT, DPT, CSRS  10/22/22, 1:20 PM

## 2022-10-23 LAB — VITAMIN B12: Vitamin B-12: 506 pg/mL (ref 232–1245)

## 2022-10-23 LAB — HOMOCYSTEINE: Homocysteine: 10.8 umol/L (ref 0.0–19.2)

## 2022-10-23 LAB — CK: Total CK: 100 U/L (ref 41–331)

## 2022-10-23 LAB — METHYLMALONIC ACID, SERUM: Methylmalonic Acid: 134 nmol/L (ref 0–378)

## 2022-10-23 LAB — FOLATE: Folate: >20 ng/mL (ref >3.0)

## 2022-10-23 LAB — TSH: TSH: 3.84 u[IU]/mL (ref 0.450–4.500)

## 2022-10-24 ENCOUNTER — Other Ambulatory Visit: Payer: Self-pay

## 2022-10-24 ENCOUNTER — Ambulatory Visit: Payer: Medicare PPO | Admitting: Neurology

## 2022-10-24 ENCOUNTER — Emergency Department (HOSPITAL_COMMUNITY): Payer: Medicare PPO

## 2022-10-24 ENCOUNTER — Inpatient Hospital Stay (HOSPITAL_COMMUNITY)
Admission: EM | Admit: 2022-10-24 | Discharge: 2022-11-02 | DRG: 074 | Disposition: A | Discharge status: Rehabilitation Facility | Payer: Medicare PPO | Attending: Internal Medicine | Admitting: Internal Medicine

## 2022-10-24 ENCOUNTER — Encounter: Payer: Medicare PPO | Admitting: Neurology

## 2022-10-24 ENCOUNTER — Encounter (HOSPITAL_COMMUNITY): Payer: Self-pay | Admitting: *Deleted

## 2022-10-24 VITALS — BP 115/74 | HR 85

## 2022-10-24 DIAGNOSIS — G610 Guillain-Barre syndrome: Secondary | ICD-10-CM

## 2022-10-24 DIAGNOSIS — I10 Essential (primary) hypertension: Secondary | ICD-10-CM | POA: Diagnosis not present

## 2022-10-24 DIAGNOSIS — E611 Iron deficiency: Secondary | ICD-10-CM | POA: Diagnosis present

## 2022-10-24 DIAGNOSIS — R110 Nausea: Secondary | ICD-10-CM | POA: Diagnosis not present

## 2022-10-24 DIAGNOSIS — I714 Abdominal aortic aneurysm, without rupture, unspecified: Secondary | ICD-10-CM | POA: Diagnosis present

## 2022-10-24 DIAGNOSIS — J439 Emphysema, unspecified: Secondary | ICD-10-CM | POA: Diagnosis present

## 2022-10-24 DIAGNOSIS — I7143 Infrarenal abdominal aortic aneurysm, without rupture: Secondary | ICD-10-CM | POA: Diagnosis not present

## 2022-10-24 DIAGNOSIS — I739 Peripheral vascular disease, unspecified: Secondary | ICD-10-CM

## 2022-10-24 DIAGNOSIS — R339 Retention of urine, unspecified: Secondary | ICD-10-CM | POA: Diagnosis present

## 2022-10-24 DIAGNOSIS — E538 Deficiency of other specified B group vitamins: Secondary | ICD-10-CM | POA: Diagnosis not present

## 2022-10-24 DIAGNOSIS — R269 Unspecified abnormalities of gait and mobility: Secondary | ICD-10-CM

## 2022-10-24 DIAGNOSIS — G6181 Chronic inflammatory demyelinating polyneuritis: Principal | ICD-10-CM | POA: Diagnosis present

## 2022-10-24 DIAGNOSIS — R2689 Other abnormalities of gait and mobility: Secondary | ICD-10-CM

## 2022-10-24 DIAGNOSIS — Z79899 Other long term (current) drug therapy: Secondary | ICD-10-CM

## 2022-10-24 DIAGNOSIS — Z87891 Personal history of nicotine dependence: Secondary | ICD-10-CM

## 2022-10-24 DIAGNOSIS — G608 Other hereditary and idiopathic neuropathies: Secondary | ICD-10-CM | POA: Diagnosis present

## 2022-10-24 LAB — COMPREHENSIVE METABOLIC PANEL
ALT: 37 U/L (ref 0–44)
AST: 24 U/L (ref 15–41)
Albumin: 4 g/dL (ref 3.5–5.0)
Alkaline Phosphatase: 42 U/L (ref 38–126)
Anion gap: 11 (ref 5–15)
BUN: 20 mg/dL (ref 8–23)
CO2: 21 mmol/L — ABNORMAL LOW (ref 22–32)
Calcium: 9.9 mg/dL (ref 8.9–10.3)
Chloride: 106 mmol/L (ref 98–111)
Creatinine, Ser: 0.9 mg/dL (ref 0.61–1.24)
GFR, Estimated: >60 mL/min (ref >60)
Glucose, Bld: 110 mg/dL — ABNORMAL HIGH (ref 70–99)
Potassium: 4.2 mmol/L (ref 3.5–5.1)
Sodium: 138 mmol/L (ref 135–145)
Total Bilirubin: 0.3 mg/dL (ref 0.3–1.2)
Total Protein: 7.8 g/dL (ref 6.5–8.1)

## 2022-10-24 LAB — URINALYSIS, ROUTINE W REFLEX MICROSCOPIC
Bacteria, UA: NONE SEEN
Bilirubin Urine: NEGATIVE
Glucose, UA: NEGATIVE mg/dL
Hgb urine dipstick: NEGATIVE
Ketones, ur: NEGATIVE mg/dL
Leukocytes,Ua: NEGATIVE
Nitrite: NEGATIVE
Protein, ur: NEGATIVE mg/dL
Specific Gravity, Urine: 1.021 (ref 1.005–1.030)
pH: 5 (ref 5.0–8.0)

## 2022-10-24 LAB — CBC WITH DIFFERENTIAL/PLATELET
Abs Immature Granulocytes: 0.02 10*3/uL (ref 0.00–0.07)
Basophils Absolute: 0.1 10*3/uL (ref 0.0–0.1)
Basophils Relative: 1 %
Eosinophils Absolute: 0.6 10*3/uL — ABNORMAL HIGH (ref 0.0–0.5)
Eosinophils Relative: 7 %
HCT: 46 % (ref 39.0–52.0)
Hemoglobin: 15.7 g/dL (ref 13.0–17.0)
Immature Granulocytes: 0 %
Lymphocytes Relative: 30 %
Lymphs Abs: 2.5 10*3/uL (ref 0.7–4.0)
MCH: 33.1 pg (ref 26.0–34.0)
MCHC: 34.1 g/dL (ref 30.0–36.0)
MCV: 97 fL (ref 80.0–100.0)
Monocytes Absolute: 0.7 10*3/uL (ref 0.1–1.0)
Monocytes Relative: 9 %
Neutro Abs: 4.4 10*3/uL (ref 1.7–7.7)
Neutrophils Relative %: 53 %
Platelets: 258 10*3/uL (ref 150–400)
RBC: 4.74 MIL/uL (ref 4.22–5.81)
RDW: 12.8 % (ref 11.5–15.5)
WBC: 8.3 10*3/uL (ref 4.0–10.5)
nRBC: 0 % (ref 0.0–0.2)

## 2022-10-24 MED ORDER — ENOXAPARIN SODIUM 40 MG/0.4ML IJ SOSY
40.0000 mg | PREFILLED_SYRINGE | Freq: Every day | INTRAMUSCULAR | Status: DC
  Administered 2022-10-25 – 2022-11-02 (×9): 40 mg via SUBCUTANEOUS
  Filled 2022-10-24 (×9): qty 0.4

## 2022-10-24 MED ORDER — LOSARTAN POTASSIUM 25 MG PO TABS
25.0000 mg | ORAL_TABLET | Freq: Every day | ORAL | Status: DC
  Administered 2022-10-25 – 2022-11-02 (×9): 25 mg via ORAL
  Filled 2022-10-24 (×9): qty 1

## 2022-10-24 MED ORDER — ONDANSETRON HCL 4 MG PO TABS
4.0000 mg | ORAL_TABLET | Freq: Four times a day (QID) | ORAL | Status: DC | PRN
  Administered 2022-10-29 – 2022-11-02 (×4): 4 mg via ORAL
  Filled 2022-10-24 (×4): qty 1

## 2022-10-24 MED ORDER — GABAPENTIN 300 MG PO CAPS
300.0000 mg | ORAL_CAPSULE | Freq: Once | ORAL | Status: AC
  Administered 2022-10-24: 300 mg via ORAL
  Filled 2022-10-24: qty 1

## 2022-10-24 MED ORDER — ACETAMINOPHEN 650 MG RE SUPP: 650.0000 mg | Freq: Four times a day (QID) | RECTAL | Status: DC | PRN

## 2022-10-24 MED ORDER — ACETAMINOPHEN 325 MG PO TABS
650.0000 mg | ORAL_TABLET | Freq: Four times a day (QID) | ORAL | Status: DC | PRN
  Administered 2022-10-25 – 2022-11-02 (×9): 650 mg via ORAL
  Filled 2022-10-24 (×9): qty 2

## 2022-10-24 MED ORDER — GABAPENTIN 300 MG PO CAPS
300.0000 mg | ORAL_CAPSULE | Freq: Three times a day (TID) | ORAL | Status: DC
  Administered 2022-10-25 – 2022-11-02 (×25): 300 mg via ORAL
  Filled 2022-10-24 (×25): qty 1

## 2022-10-24 MED ORDER — ONDANSETRON HCL 4 MG/2ML IJ SOLN
4.0000 mg | Freq: Four times a day (QID) | INTRAMUSCULAR | Status: DC | PRN
  Administered 2022-10-26: 4 mg via INTRAVENOUS
  Filled 2022-10-24 (×2): qty 2

## 2022-10-24 MED ORDER — POLYETHYLENE GLYCOL 3350 17 G PO PACK
17.0000 g | PACK | Freq: Every day | ORAL | Status: DC | PRN
  Administered 2022-10-26 – 2022-10-30 (×5): 17 g via ORAL
  Filled 2022-10-24 (×5): qty 1

## 2022-10-24 MED ORDER — ACETAMINOPHEN 500 MG PO TABS
1000.0000 mg | ORAL_TABLET | Freq: Once | ORAL | Status: AC
  Administered 2022-10-24: 1000 mg via ORAL
  Filled 2022-10-24: qty 2

## 2022-10-24 NOTE — ED Notes (Signed)
Neurology at bedside.

## 2022-10-24 NOTE — ED Triage Notes (Signed)
The pt has g-b  diagnosed the Friday after thanksgiving  following the flu covid and shingle injections.  Today he was sent from his doctors office he has been unable to walk without assistance    pt c/o leg pain

## 2022-10-24 NOTE — H&P (Addendum)
History and Physical    Joe Greene YIR:[id number] DOB: 1951/08/11 DOA: 10/24/2022  PCP: Loyola Mast, MD   Patient coming from: Home   Chief Complaint: Weakness   HPI: Render Marley is a 71 y.o. 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  Patient had improved with 5 days of IVIG last month but now has worsening ascending paresthesias, urinary retention, neuropathic pain, and difficulty with gait and even standing.  He was evaluated by his outpatient neurologist today with nerve conduction study and electromyography which was consistent with acute inflammatory demyelinating polyneuropathy.  Outpatient treatment with IVIG could not be arranged in a timely manner and so the patient was directed to the emergency department by his neurologist.  He denies fever, chills, chest pain, or dysphagia.   He quit smoking since the hospitalization last month.    ED Course: Upon arrival to the ED, patient is found to be afebrile and saturating well on room air with normal heart rate and stable blood pressure.  EKG demonstrates sinus rhythm and chest x-ray is negative for acute cardiopulmonary disease.  CMP and CBC are unremarkable.  Neurology was consulted by the ED physician and the patient was treated with gabapentin and acetaminophen in the ED.  Review of Systems:  All other systems reviewed and apart from HPI, are negative.  Past Medical History:  Diagnosis Date   AAA (abdominal aortic aneurysm) (HCC)    Cataract    forming     Past Surgical History:  Procedure Laterality Date   FOOT SURGERY Right    HERNIA REPAIR Right    KNEE ARTHROSCOPY Left    WISDOM TOOTH EXTRACTION      Social History:   reports that he quit smoking about 5 weeks ago. His smoking use included cigarettes. He has a 100.00 pack-year smoking history. He has never  used smokeless tobacco. He reports current alcohol use of about 5.0 standard drinks of alcohol per week. He reports that he does not use drugs.  No Known Allergies  Family History  Problem Relation Age of Onset   Stroke Mother 32   AAA (abdominal aortic aneurysm) Mother    Cancer Father        Prostate   Colon cancer Neg Hx    Colon polyps Neg Hx    Esophageal cancer Neg Hx    Rectal cancer Neg Hx    Stomach cancer Neg Hx      Prior to Admission medications   Medication Sig Start Date End Date Taking? Authorizing Provider  calcium carbonate (TUMS EX) 750 MG chewable tablet Chew 1,500 mg by mouth 2 (two) times daily as needed for heartburn.    [provider]  docusate sodium (COLACE) 50 MG capsule Take 50 mg by mouth daily.    [provider]  ferrous sulfate 325 (65 FE) MG EC tablet Take 325 mg by mouth daily. When able to remember    [provider]  gabapentin (NEURONTIN) 300 MG capsule Take 1 capsule (300 mg total) by mouth 3 (three) times daily. 10/18/22   Levert Feinstein, MD  ibuprofen (ADVIL) 200 MG tablet Take 400 mg by mouth 3 (three) times daily as needed for headache or mild pain.    [provider]  losartan (COZAAR) 25 MG tablet Take 1 tablet (25 mg total) by mouth daily. 10/02/22   Azucena Fallen,  MD  MAGNESIUM PO Take 1 tablet by mouth daily. When able to remember    [provider]  Multiple Vitamin (MULTIVITAMIN WITH MINERALS) TABS tablet Take 1 tablet by mouth daily. 10/02/22   Azucena Fallen, MD  nicotine (NICODERM CQ - DOSED IN MG/24 HOURS) 21 mg/24hr patch Place 1 patch (21 mg total) onto the skin daily. 10/02/22   Azucena Fallen, MD  Omega-3 Fatty Acids (FISH OIL PO) Take 1 capsule by mouth daily. When able to remember    [provider]  sildenafil (VIAGRA) 100 MG tablet Take 0.5-1 tablets (50-100 mg total) by mouth daily as needed for erectile dysfunction. Patient taking differently: Take 50 mg by  mouth daily as needed for erectile dysfunction. 10/25/21   Loyola Mast, MD    Physical Exam: Vitals:   10/24/22 1531 10/24/22 1532 10/24/22 1805 10/24/22 2040  BP: 106/86  137/89 (!) 141/95  Pulse: 78  85 85  Resp: 18  16 16   Temp: 97.7 F (36.5 C)  97.8 F (36.6 C) 97.9 F (36.6 C)  TempSrc:    Oral  SpO2: 97%  96% 97%  Weight:  94.3 kg    Height:  5\' 10"  (1.778 m)       Constitutional: NAD, no pallor or diaphoresis  Eyes: PERTLA, lids and conjunctivae normal ENMT: Mucous membranes are moist. Posterior pharynx clear of any exudate or lesions.   Neck: supple, no masses  Respiratory: no wheezing, no crackles. No accessory muscle use.  Cardiovascular: S1 & S2 heard, regular rate and rhythm. No extremity edema.   Abdomen: No distension, no tenderness, soft. Bowel sounds active.  Musculoskeletal: no clubbing / cyanosis. No joint deformity upper and lower extremities.   Skin: no significant rashes, lesions, ulcers. Warm, dry, well-perfused. Neurologic: CN 2-12 grossly intact. Sensation to light touch diminished. Strength 4/5 in all 4 limbs. Alert and oriented.  Psychiatric: Calm. Cooperative.    Labs and Imaging on Admission: I have personally reviewed following labs and imaging studies  CBC: Recent Labs  Lab 10/24/22 1648  WBC 8.3  NEUTROABS 4.4  HGB 15.7  HCT 46.0  MCV 97.0  PLT 258   Basic Metabolic Panel: Recent Labs  Lab 10/24/22 1648  NA 138  K 4.2  CL 106  CO2 21*  GLUCOSE 110*  BUN 20  CREATININE 0.90  CALCIUM 9.9   GFR: Estimated Creatinine Clearance: 86.8 mL/min (by C-G formula based on SCr of 0.9 mg/dL). Liver Function Tests: Recent Labs  Lab 10/24/22 1648  AST 24  ALT 37  ALKPHOS 42  BILITOT 0.3  PROT 7.8  ALBUMIN 4.0   No results for input(s): "LIPASE", "AMYLASE" in the last 168 hours. No results for input(s): "AMMONIA" in the last 168 hours. Coagulation Profile: No results for input(s): "INR", "PROTIME" in the last 168  hours. Cardiac Enzymes: Recent Labs  Lab 10/18/22 1039  CKTOTAL 100   BNP (last 3 results) Recent Labs    09/24/22 0840  PROBNP 12.0   HbA1C: No results for input(s): "HGBA1C" in the last 72 hours. CBG: No results for input(s): "GLUCAP" in the last 168 hours. Lipid Profile: No results for input(s): "CHOL", "HDL", "LDLCALC", "TRIG", "CHOLHDL", "LDLDIRECT" in the last 72 hours. Thyroid Function Tests: No results for input(s): "TSH", "T4TOTAL", "FREET4", "T3FREE", "THYROIDAB" in the last 72 hours. Anemia Panel: No results for input(s): "VITAMINB12", "FOLATE", "FERRITIN", "TIBC", "IRON", "RETICCTPCT" in the last 72 hours. Urine analysis:    Component Value Date/Time  COLORURINE YELLOW 10/24/2022 2041   APPEARANCEUR CLEAR 10/24/2022 2041   LABSPEC 1.021 10/24/2022 2041   PHURINE 5.0 10/24/2022 2041   GLUCOSEU NEGATIVE 10/24/2022 2041   GLUCOSEU NEGATIVE 09/02/2020 1112   HGBUR NEGATIVE 10/24/2022 2041   BILIRUBINUR NEGATIVE 10/24/2022 2041   KETONESUR NEGATIVE 10/24/2022 2041   PROTEINUR NEGATIVE 10/24/2022 2041   UROBILINOGEN 0.2 09/02/2020 1112   NITRITE NEGATIVE 10/24/2022 2041   LEUKOCYTESUR NEGATIVE 10/24/2022 2041   Sepsis Labs: @LABRCNTIP (procalcitonin:4,lacticidven:4) )No results found for this or any previous visit (from the past 240 hour(s)).   Radiological Exams on Admission: DG Chest 2 View  Result Date: 10/24/2022 CLINICAL DATA:  Short of breath EXAM: CHEST - 2 VIEW COMPARISON:  09/24/2022 FINDINGS: Frontal and lateral views of the chest demonstrate a stable cardiac silhouette. No airspace disease, effusion, or pneumothorax. No acute bony abnormalities. IMPRESSION: 1. No acute intrathoracic process. Electronically Signed   By: 09/26/2022 M.D.   On: 10/24/2022 17:15   NCV with EMG(electromyography)  Result Date: 10/24/2022 10/26/2022, MD     10/24/2022  2:48 PM     Full Name: 10/26/2022 Gender: Male MRN #: [id number] Date of Birth: 1951-01-29   Visit  Date: 10/24/2022 13:32 Age: 79 Years Examining Physician: 62 Referring Physician: Levert Feinstein Height: 5 feet 10 inch History: 71 year old male presenting with subacute onset progressive ascending paresthesia, gait abnormality, autonomic dysfunction Summary of the test: nerve conduction study: Right sural, superficial peroneal, median, ulnar sensory responses were absent. Right radial sensory response showed moderately decreased snap amplitude, within normal range peak latency Right tibial, ulnar, and median motor responses all demonstrate significantly prolonged distal latency, temporal dispersion of the waveform, unreliable responses stimulating at proximal stimulation site, No F-wave latency was elicited stimulating of right tibialis nerve.  Right ulnar F-wave latency was severely prolonged Electromyography: Selected needle examinations were done at right lower, upper extremity muscles, right lumbar and cervical paraspinal muscles. There is evidence of increased insertional activity, complex and large motor unit potential noted at right distal neck and upper extremity muscles, there was no evidence of spontaneous activity. Right lumbosacral paraspinal muscles showed increased insertional activity, 1-2+ spontaneous activity, polyphasic motor unit potential. Conclusion: This is an abnormal study.  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 ------------------------------- 62 M.D. Ph.D. [facility] [address] [place], [place] Tel: [phone] Fax: [phone] Verbal informed consent was obtained from the patient, patient was informed of potential risk of procedure, including bruising, bleeding, hematoma formation, infection, muscle weakness, muscle pain, numbness, among others.     MNC   Nerve / Sites Muscle Latency Ref. Amplitude Ref. Rel Amp Segments Distance  Velocity Ref. Area   ms ms mV mV %  cm m/s m/s mVms R Median - APB    Wrist APB 22.9 ?4.4 0.1 ?4.0 100 Wrist - APB 7   0.2    Upper arm APB 30.2  1.2  1138 Upper arm - Wrist   ?49 10.1 R Ulnar - ADM    Wrist ADM 7.0 ?3.3 1.6 ?6.0 100 Wrist - ADM 7   4.8    B.Elbow ADM 10.4  0.8  50.3 B.Elbow - Wrist 16 47 ?49 1.4    A.Elbow ADM 12.8  2.5  305 A.Elbow - B.Elbow 13 55 ?49 12.9 R Tibial - AH    Ankle AH 14.8 ?5.8  ?4.0  Ankle - AH 9  Pop fossa AH      Pop fossa - Ankle   ?41          SNC   Nerve / Sites Rec. Site Peak Lat Ref.  Amp Ref. Segments Distance   ms ms V V  cm R Radial - Anatomical snuff box (Forearm)    Forearm Wrist 2.5 ?2.9 10 ?15 Forearm - Wrist 10 R Sural - Ankle (Calf)    Calf Ankle NR ?4.4 NR ?6 Calf - Ankle 14 R Superficial peroneal - Ankle    Lat leg Ankle NR ?4.4 NR ?6 Lat leg - Ankle 14 R Median - Orthodromic (Dig II, Mid palm)    Dig II Wrist NR ?3.4 NR ?10 Dig II - Wrist 13 R Ulnar - Orthodromic, (Dig V, Mid palm)    Dig V Wrist NR ?3.1 NR ?5 Dig V - Wrist 9111             F  Wave   Nerve F Lat Ref.  ms ms R Ulnar - ADM 49.2 ?32.0 R Tibial - AH NR ?56.0       EMG Summary Table   Spontaneous MUAP Recruitment Muscle IA Fib PSW Fasc Other Amp Dur. Poly Pattern R. Tibialis anterior Increased None None None _______ Normal Increased 1+ Reduced R. Vastus lateralis Normal None None None _______ Normal Normal Normal Reduced R. Peroneus longus Normal None None None _______ Normal Normal Normal Reduced R. Tibialis posterior Normal None None None _______ Normal Normal Normal Reduced R. Gastrocnemius (Medial head) Normal None None None _______ Normal Normal Normal Reduced R. Lumbar paraspinals (low) Increased 1+ 1+ None _______ Normal Normal Normal Normal R. Lumbar paraspinals (mid) Increased 1+ 1+ None _______ Normal Normal Normal Normal R. First dorsal interosseous Normal None None None _______ Normal Normal Normal Reduced R. Pronator teres Normal None None None _______ Normal Normal Normal Reduced R.  Biceps brachii Normal None None None _______ Normal Normal Normal Reduced R. Deltoid Normal None None None _______ Normal Normal Normal Reduced R. Cervical paraspinals Normal None None None _______ Normal Normal Normal Reduced     EKG: Independently reviewed. Sinus rhythm.   Assessment/Plan   1. AIDP   - Appreciate neurology consultant, planning to treat with plasma exchange or IVIG    2. Hypertension  - Continue losartan   3. AAA  - Noted on recent imaging, outpatient follow-up recommended     DVT prophylaxis: Lovenox  Code Status: Full  Level of Care: Level of care: Med-Surg Family Communication: none present  Disposition Plan:  Patient is from: home  Anticipated d/c is to: TBD Anticipated d/c date is: TBD Patient currently: Pending treatment with IVIG or plasma exchange  Consults called: Neurology  Admission status: Observation     Briscoe Deutscherimothy S Emelly Wurtz, MD [facility]  10/24/2022, 9:29 PM

## 2022-10-24 NOTE — Procedures (Signed)
Full Name: Joe Greene Gender: Male MRN #: [id number] Date of Birth: [birth year]    Visit Date: 10/24/2022 13:32 Age: 72 Years Examining Physician: Levert Feinstein Referring Physician: Levert Feinstein Height: 5 feet 10 inch History: 71 year old male presenting with subacute onset progressive ascending paresthesia, gait abnormality, autonomic dysfunction  Summary of the test: nerve conduction study: Right sural, superficial peroneal, median, ulnar sensory responses were absent.  Right radial sensory response showed moderately decreased snap amplitude, within normal range peak latency  Right tibial, ulnar, and median motor responses all demonstrate significantly prolonged distal latency, temporal dispersion of the waveform, unreliable responses stimulating at proximal stimulation site,  No F-wave latency was elicited stimulating of right tibialis nerve.  Right ulnar F-wave latency was severely prolonged  Electromyography: Selected needle examinations were done at right lower, upper extremity muscles, right lumbar and cervical paraspinal muscles.  There is evidence of increased insertional activity, complex and large motor unit potential noted at right distal neck and upper extremity muscles, there was no evidence of spontaneous activity.  Right lumbosacral paraspinal muscles showed increased insertional activity, 1-2+ spontaneous activity, polyphasic motor unit potential.  Conclusion: This is an abnormal study.  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    ------------------------------- Levert Feinstein M.D. Ph.D.  [facility] [address] [place], [place] [address] Tel: [phone] Fax: [phone]  Verbal informed consent was obtained from the patient, patient was informed of potential risk of procedure, including bruising, bleeding, hematoma formation,  infection, muscle weakness, muscle pain, numbness, among others.        MNC    Nerve / Sites Muscle Latency Ref. Amplitude Ref. Rel Amp Segments Distance Velocity Ref. Area    ms ms mV mV %  cm m/s m/s mVms  R Median - APB     Wrist APB 22.9 ?4.4 0.1 ?4.0 100 Wrist - APB 7   0.2     Upper arm APB 30.2  1.2  1138 Upper arm - Wrist   ?49 10.1  R Ulnar - ADM     Wrist ADM 7.0 ?3.3 1.6 ?6.0 100 Wrist - ADM 7   4.8     B.Elbow ADM 10.4  0.8  50.3 B.Elbow - Wrist 16 47 ?49 1.4     A.Elbow ADM 12.8  2.5  305 A.Elbow - B.Elbow 13 55 ?49 12.9  R Tibial - AH     Ankle AH 14.8 ?5.8  ?4.0  Ankle - AH 9        Pop fossa AH      Pop fossa - Ankle   ?41            SNC    Nerve / Sites Rec. Site Peak Lat Ref.  Amp Ref. Segments Distance    ms ms V V  cm  R Radial - Anatomical snuff box (Forearm)     Forearm Wrist 2.5 ?2.9 10 ?15 Forearm - Wrist 10  R Sural - Ankle (Calf)     Calf Ankle NR ?4.4 NR ?6 Calf - Ankle 14  R Superficial peroneal - Ankle     Lat leg Ankle NR ?4.4 NR ?6 Lat leg - Ankle 14  R Median - Orthodromic (Dig II, Mid palm)     Dig II Wrist NR ?3.4 NR ?10 Dig II - Wrist 13  R Ulnar - Orthodromic, (Dig V, Mid palm)  Dig V Wrist NR ?3.1 NR ?5 Dig V - Wrist 26               F  Wave    Nerve F Lat Ref.   ms ms  R Ulnar - ADM 49.2 ?32.0  R Tibial - AH NR ?56.0         EMG Summary Table    Spontaneous MUAP Recruitment  Muscle IA Fib PSW Fasc Other Amp Dur. Poly Pattern  R. Tibialis anterior Increased None None None _______ Normal Increased 1+ Reduced  R. Vastus lateralis Normal None None None _______ Normal Normal Normal Reduced  R. Peroneus longus Normal None None None _______ Normal Normal Normal Reduced  R. Tibialis posterior Normal None None None _______ Normal Normal Normal Reduced  R. Gastrocnemius (Medial head) Normal None None None _______ Normal Normal Normal Reduced  R. Lumbar paraspinals (low) Increased 1+ 1+ None _______ Normal Normal Normal Normal  R.  Lumbar paraspinals (mid) Increased 1+ 1+ None _______ Normal Normal Normal Normal  R. First dorsal interosseous Normal None None None _______ Normal Normal Normal Reduced  R. Pronator teres Normal None None None _______ Normal Normal Normal Reduced  R. Biceps brachii Normal None None None _______ Normal Normal Normal Reduced  R. Deltoid Normal None None None _______ Normal Normal Normal Reduced  R. Cervical paraspinals Normal None None None _______ Normal Normal Normal Reduced

## 2022-10-24 NOTE — ED Provider Notes (Addendum)
[facility] EMERGENCY DEPARTMENT Provider Note   CSN: [id number] Arrival date & time: 10/24/22  1439     History  Chief Complaint  Patient presents with   unable to walk    Joe Greene is a 71 y.o. male.   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  Pt's symptoms started since early November 2023 following upper respiratory infection, loss of taste 2 weeks prior to the symptom onset mild to moderate improvement following IVIG from November 14-18 2023.  Now his symptoms continue to progress, reported worsening ascending paresthesia, gait abnormality, need assistant to transfer him now, also complains of autonomic symptoms, difficulty emptying bladder, urinary frequency, shortness of breath intermittently but gets out of breath with only minimal activity due to significant effort it takes.  He is having to stop frequently to catch his breath.  He is getting authorized for outpt IVIG but still going through the approval process and will take several weeks so Dr. Terrace Arabia sent pt here for ongoing therapy.   The history is provided by the patient, the spouse and medical records.       Home Medications Prior to Admission medications   Medication Sig Start Date End Date Taking? Authorizing Provider  calcium carbonate (TUMS EX) 750 MG chewable tablet Chew 1,500 mg by mouth 2 (two) times daily as needed for heartburn.    [provider]  docusate sodium (COLACE) 50 MG capsule Take 50 mg by mouth daily.    [provider]  ferrous sulfate 325 (65 FE) MG EC tablet Take 325 mg by mouth daily. When able to remember    [provider]  gabapentin (NEURONTIN) 300 MG capsule Take 1 capsule (300 mg total) by mouth 3 (three) times daily. 10/18/22   Levert Feinstein, MD  ibuprofen (ADVIL) 200 MG tablet Take 400 mg by mouth 3 (three) times daily as needed for headache or mild pain.    [provider]  losartan  (COZAAR) 25 MG tablet Take 1 tablet (25 mg total) by mouth daily. 10/02/22   Azucena Fallen, MD  MAGNESIUM PO Take 1 tablet by mouth daily. When able to remember    [provider]  Multiple Vitamin (MULTIVITAMIN WITH MINERALS) TABS tablet Take 1 tablet by mouth daily. 10/02/22   Azucena Fallen, MD  nicotine (NICODERM CQ - DOSED IN MG/24 HOURS) 21 mg/24hr patch Place 1 patch (21 mg total) onto the skin daily. 10/02/22   Azucena Fallen, MD  Omega-3 Fatty Acids (FISH OIL PO) Take 1 capsule by mouth daily. When able to remember    [provider]  sildenafil (VIAGRA) 100 MG tablet Take 0.5-1 tablets (50-100 mg total) by mouth daily as needed for erectile dysfunction. Patient taking differently: Take 50 mg by mouth daily as needed for erectile dysfunction. 10/25/21   Loyola Mast, MD      Allergies    Patient has no known allergies.    Review of Systems   Review of Systems  Physical Exam Updated Vital Signs BP (!) 141/95   Pulse 85   Temp 97.9 F (36.6 C) (Oral)   Resp 16   Ht 5\' 10"  (1.778 m)   Wt 94.3 kg   SpO2 97%   BMI 29.83 kg/m  Physical Exam Vitals and nursing note reviewed.  Constitutional:      General: He is not in acute distress.  Appearance: He is well-developed.  HENT:     Head: Normocephalic and atraumatic.  Eyes:     Conjunctiva/sclera: Conjunctivae normal.     Pupils: Pupils are equal, round, and reactive to light.  Cardiovascular:     Rate and Rhythm: Normal rate and regular rhythm.     Heart sounds: No murmur heard. Pulmonary:     Effort: Pulmonary effort is normal. No respiratory distress.     Breath sounds: Normal breath sounds. No wheezing or rales.  Abdominal:     General: There is no distension.     Palpations: Abdomen is soft.     Tenderness: There is no abdominal tenderness. There is no guarding or rebound.  Musculoskeletal:        General: No tenderness. Normal range of motion.     Cervical back: Normal  range of motion and neck supple.     Right lower leg: No edema.     Left lower leg: No edema.  Skin:    General: Skin is warm and dry.     Findings: No erythema or rash.  Neurological:     Mental Status: He is alert and oriented to person, place, and time.     Motor: Weakness present.     Deep Tendon Reflexes: Reflexes abnormal.     Comments: Normal voice.  3/5 strength in lower ext but unable to lift leg back onto the bed when sitting up.  4/5 strength in bilateral upper ext.  No reflexes  Psychiatric:        Mood and Affect: Mood normal.        Behavior: Behavior normal.     ED Results / Procedures / Treatments   Labs (all labs ordered are listed, but only abnormal results are displayed) Labs Reviewed  COMPREHENSIVE METABOLIC PANEL - Abnormal; Notable for the following components:      Result Value   CO2 21 (*)    Glucose, Bld 110 (*)    All other components within normal limits  CBC WITH DIFFERENTIAL/PLATELET - Abnormal; Notable for the following components:   Eosinophils Absolute 0.6 (*)    All other components within normal limits  URINALYSIS, ROUTINE W REFLEX MICROSCOPIC    EKG EKG Interpretation  Date/Time:  Wednesday October 24 2022 15:37:14 EST Ventricular Rate:  77 PR Interval:  146 QRS Duration: 88 QT Interval:  378 QTC Calculation: 427 R Axis:   32 Text Interpretation: Normal sinus rhythm Normal ECG When compared with ECG of 24-Sep-2022 12:07, PREVIOUS ECG IS PRESENT No significant change since last tracing Confirmed by Gwyneth Sprout ([id number]) on 10/24/2022 7:48:41 PM  Radiology DG Chest 2 View  Result Date: 10/24/2022 CLINICAL DATA:  Short of breath EXAM: CHEST - 2 VIEW COMPARISON:  09/24/2022 FINDINGS: Frontal and lateral views of the chest demonstrate a stable cardiac silhouette. No airspace disease, effusion, or pneumothorax. No acute bony abnormalities. IMPRESSION: 1. No acute intrathoracic process. Electronically Signed   By: Sharlet Salina M.D.    On: 10/24/2022 17:15   NCV with EMG(electromyography)  Result Date: 10/24/2022 Levert Feinstein, MD     10/24/2022  2:48 PM     Full Name: Joe Greene Gender: Male MRN #: [id number] Date of Birth: 1951/04/22   Visit Date: 10/24/2022 13:32 Age: 29 Years Examining Physician: Levert Feinstein Referring Physician: Levert Feinstein Height: 5 feet 10 inch History: 71 year old male presenting with subacute onset progressive ascending paresthesia, gait abnormality, autonomic dysfunction Summary of the test: nerve conduction study: Right sural, superficial  peroneal, median, ulnar sensory responses were absent. Right radial sensory response showed moderately decreased snap amplitude, within normal range peak latency Right tibial, ulnar, and median motor responses all demonstrate significantly prolonged distal latency, temporal dispersion of the waveform, unreliable responses stimulating at proximal stimulation site, No F-wave latency was elicited stimulating of right tibialis nerve.  Right ulnar F-wave latency was severely prolonged Electromyography: Selected needle examinations were done at right lower, upper extremity muscles, right lumbar and cervical paraspinal muscles. There is evidence of increased insertional activity, complex and large motor unit potential noted at right distal neck and upper extremity muscles, there was no evidence of spontaneous activity. Right lumbosacral paraspinal muscles showed increased insertional activity, 1-2+ spontaneous activity, polyphasic motor unit potential. Conclusion: This is an abnormal study.  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 ------------------------------- Levert FeinsteinYIjun Yan M.D. Ph.D. [facility] [address], [address] [place], [place] [address] Tel: [phone] Fax: [phone] Verbal informed consent was obtained from the patient, patient was informed of potential risk of  procedure, including bruising, bleeding, hematoma formation, infection, muscle weakness, muscle pain, numbness, among others.     MNC   Nerve / Sites Muscle Latency Ref. Amplitude Ref. Rel Amp Segments Distance Velocity Ref. Area   ms ms mV mV %  cm m/s m/s mVms R Median - APB    Wrist APB 22.9 ?4.4 0.1 ?4.0 100 Wrist - APB 7   0.2    Upper arm APB 30.2  1.2  1138 Upper arm - Wrist   ?49 10.1 R Ulnar - ADM    Wrist ADM 7.0 ?3.3 1.6 ?6.0 100 Wrist - ADM 7   4.8    B.Elbow ADM 10.4  0.8  50.3 B.Elbow - Wrist 16 47 ?49 1.4    A.Elbow ADM 12.8  2.5  305 A.Elbow - B.Elbow 13 55 ?49 12.9 R Tibial - AH    Ankle AH 14.8 ?5.8  ?4.0  Ankle - AH 9       Pop fossa AH      Pop fossa - Ankle   ?41          SNC   Nerve / Sites Rec. Site Peak Lat Ref.  Amp Ref. Segments Distance   ms ms V V  cm R Radial - Anatomical snuff box (Forearm)    Forearm Wrist 2.5 ?2.9 10 ?15 Forearm - Wrist 10 R Sural - Ankle (Calf)    Calf Ankle NR ?4.4 NR ?6 Calf - Ankle 14 R Superficial peroneal - Ankle    Lat leg Ankle NR ?4.4 NR ?6 Lat leg - Ankle 14 R Median - Orthodromic (Dig II, Mid palm)    Dig II Wrist NR ?3.4 NR ?10 Dig II - Wrist 13 R Ulnar - Orthodromic, (Dig V, Mid palm)    Dig V Wrist NR ?3.1 NR ?5 Dig V - Wrist 5611             F  Wave   Nerve F Lat Ref.  ms ms R Ulnar - ADM 49.2 ?32.0 R Tibial - AH NR ?56.0       EMG Summary Table   Spontaneous MUAP Recruitment Muscle IA Fib PSW Fasc Other Amp Dur. Poly Pattern R. Tibialis anterior Increased None None None _______ Normal Increased 1+ Reduced R. Vastus lateralis Normal None None None _______ Normal Normal Normal Reduced R. Peroneus longus Normal None None None _______ Normal Normal Normal  Reduced R. Tibialis posterior Normal None None None _______ Normal Normal Normal Reduced R. Gastrocnemius (Medial head) Normal None None None _______ Normal Normal Normal Reduced R. Lumbar paraspinals (low) Increased 1+ 1+ None _______ Normal Normal Normal Normal R. Lumbar paraspinals (mid) Increased 1+  1+ None _______ Normal Normal Normal Normal R. First dorsal interosseous Normal None None None _______ Normal Normal Normal Reduced R. Pronator teres Normal None None None _______ Normal Normal Normal Reduced R. Biceps brachii Normal None None None _______ Normal Normal Normal Reduced R. Deltoid Normal None None None _______ Normal Normal Normal Reduced R. Cervical paraspinals Normal None None None _______ Normal Normal Normal Reduced     Procedures Procedures    Medications Ordered in ED Medications  gabapentin (NEURONTIN) capsule 300 mg (has no administration in time range)    ED Course/ Medical Decision Making/ A&P                           Medical Decision Making Amount and/or Complexity of Data Reviewed External Data Reviewed: notes.    Details: neurology Labs: ordered. Decision-making details documented in ED Course. Radiology: ordered and independent interpretation performed. Decision-making details documented in ED Course. ECG/medicine tests: ordered and independent interpretation performed. Decision-making details documented in ED Course.  Risk Prescription drug management. Decision regarding hospitalization.   Pt with multiple medical problems and comorbidities and presenting today with a complaint that caries a high risk for morbidity and mortality.  Patient returning today due to worsening ascending paralysis.  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  Patient is now having up bowel symptoms, autonomic symptoms and intermittent shortness of breath.  Patient is getting cleared for outpatient IVIG but is going to take several weeks and given his ascending symptoms there was concern that he needed treatment now.  Patient is now not even able to stand without assistance.  Patient is now having urinary symptoms and feels that he is not completely emptying but denying dysuria.  Will do a bladder scan to ensure no significant  urinary retention.  I independently interpreted patient's labs and EKG and CBC and CMP are within normal limits, and EKG is within normal limits. I have independently visualized and interpreted pt's images today.  Chest x-ray without acute findings.  Will consult neurology for further recommendations on treatment.  Patient meets criteria for admission and will admit to the hospitalist service.          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

## 2022-10-24 NOTE — Consult Note (Addendum)
Neurology Consultation  Reason for Consult: Worsening weakness, prior history of Guillain-Barr syndrome status post IVIG that was completed 09/30/2022  Referring Physician: Dr. Gwyneth Sprout, ED provider  CC: Worsening weakness  History is obtained from: Patient, chart  HPI: Joe Greene is a 71 y.o. 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  He had moderate improvement from IVIG for 5 days but now again has worsening ascending paresthesias and difficulty with gait as well as urinary retention, urinary frequency and occasional shortness of breath. The outpatient practice has started IVIG prior authorization process but that would take a few weeks to complete.  He was sent to the ER for consideration for IVIG or plasma exchange-the outpatient neurologist has not conveyed to [place] which modality they would prefer.  At this point, I do not have means to reach them-we will reach them in the morning.  Patient denies chest pain, nausea or vomiting.  Denies double vision.  Denies difficulty with chewing or swallowing.  ROS: Full ROS was performed and is negative except as noted in the HPI.  Past Medical History:  Diagnosis Date   AAA (abdominal aortic aneurysm) (HCC)    Cataract    forming      Family History  Problem Relation Age of Onset   Stroke Mother 54   AAA (abdominal aortic aneurysm) Mother    Cancer Father        Prostate   Colon cancer Neg Hx    Colon polyps Neg Hx    Esophageal cancer Neg Hx    Rectal cancer Neg Hx    Stomach cancer  Neg Hx      Social History:   reports that he quit smoking about 5 weeks ago. His smoking use included cigarettes. He has a 100.00 pack-year smoking history. He has never used smokeless tobacco. He reports current alcohol use of about 5.0 standard drinks of alcohol per week. He reports that he does not use drugs.  Medications No current facility-administered medications for this encounter.  Current Outpatient Medications:    calcium carbonate (TUMS EX) 750 MG chewable tablet, Chew 1,500 mg by mouth 2 (two) times daily as needed for heartburn., Disp: , Rfl:    docusate sodium (COLACE) 50 MG capsule, Take 50 mg by mouth daily., Disp: , Rfl:    ferrous sulfate 325 (65 FE) MG EC tablet, Take 325 mg by mouth daily. When able to remember, Disp: , Rfl:    gabapentin (NEURONTIN) 300 MG capsule, Take 1 capsule (300 mg total) by mouth 3 (three) times daily., Disp: 90 capsule, Rfl: 11   ibuprofen (ADVIL) 200 MG tablet, Take 400 mg by mouth 3 (three) times daily as needed for headache or mild pain., Disp: , Rfl:    losartan (COZAAR) 25 MG tablet, Take 1 tablet (25 mg total) by mouth daily., Disp: 30 tablet, Rfl: 1   MAGNESIUM PO, Take 1 tablet by mouth daily. When able to remember, Disp: , Rfl:    Multiple Vitamin (MULTIVITAMIN WITH MINERALS) TABS tablet, Take 1 tablet by mouth daily., Disp:  30 tablet, Rfl: 1   nicotine (NICODERM CQ - DOSED IN MG/24 HOURS) 21 mg/24hr patch, Place 1 patch (21 mg total) onto the skin daily., Disp: 28 patch, Rfl: 0   Omega-3 Fatty Acids (FISH OIL PO), Take 1 capsule by mouth daily. When able to remember, Disp: , Rfl:    sildenafil (VIAGRA) 100 MG tablet, Take 0.5-1 tablets (50-100 mg total) by mouth daily as needed for erectile dysfunction. (Patient taking differently: Take 50 mg by mouth daily as needed for erectile dysfunction.), Disp: 5 tablet, Rfl: 11   Exam: Current vital signs: BP (!) 141/95   Pulse 85   Temp 97.9 F (36.6 C) (Oral)   Resp 16   Ht 5\' 10"  (1.778  m)   Wt 94.3 kg   SpO2 97%   BMI 29.83 kg/m  Vital signs in last 24 hours: Temp:  [97.7 F (36.5 C)-97.9 F (36.6 C)] 97.9 F (36.6 C) (12/13 2040) Pulse Rate:  [78-85] 85 (12/13 2040) Resp:  [16-18] 16 (12/13 2040) BP: (106-141)/(74-95) 141/95 (12/13 2040) SpO2:  [96 %-97 %] 97 % (12/13 2040) Weight:  [94.3 kg] 94.3 kg (12/13 1532) General: Awake alert in no distress HEENT: Normocephalic atraumatic Lungs: Clear Cardiovascular: Regular rate rhythm Abdomen nondistended nontender Extremities warm well-perfused Neurological exam Awake alert oriented x 3 No dysarthria No evidence of aphasia Cranial nerves II to XII intact Motor examination with 4+/5 strength in bilateral upper extremities and 4/5 strength at the hip flexors and knee extensors bilaterally. Sensation diminished to vibratory sense and pinprick up to mid thigh and up to above the elbow on the upper extremities bilaterally. DTRs are completely absent Coordination examination: No dysmetria Gait testing deferred at this time-outpatient gait testing revealed needs to push to get up from sitting position, wide-based gait with assistance needed to transfer.   Labs I have reviewed labs in epic and the results pertinent to this consultation are: CBC    Component Value Date/Time   WBC 8.3 10/24/2022 1648   RBC 4.74 10/24/2022 1648   HGB 15.7 10/24/2022 1648   HCT 46.0 10/24/2022 1648   PLT 258 10/24/2022 1648   MCV 97.0 10/24/2022 1648   MCH 33.1 10/24/2022 1648   MCHC 34.1 10/24/2022 1648   RDW 12.8 10/24/2022 1648   LYMPHSABS 2.5 10/24/2022 1648   MONOABS 0.7 10/24/2022 1648   EOSABS 0.6 (H) 10/24/2022 1648   BASOSABS 0.1 10/24/2022 1648    CMP     Component Value Date/Time   NA 138 10/24/2022 1648   K 4.2 10/24/2022 1648   CL 106 10/24/2022 1648   CO2 21 (L) 10/24/2022 1648   GLUCOSE 110 (H) 10/24/2022 1648   BUN 20 10/24/2022 1648   CREATININE 0.90 10/24/2022 1648   CALCIUM 9.9 10/24/2022 1648    PROT 7.8 10/24/2022 1648   ALBUMIN 4.0 10/24/2022 1648   AST 24 10/24/2022 1648   ALT 37 10/24/2022 1648   ALKPHOS 42 10/24/2022 1648   BILITOT 0.3 10/24/2022 1648   GFRNONAA >60 10/24/2022 1648    Imaging I have reviewed the images obtained:  MR brain 09/25/2022-no acute changes  Assessment: 71 year old man with a history of Guillain-Barr syndrome treated with 5 days of IVIG who had moderate improvement from late November till about early part of December, now with worsening symptoms of gait imbalance, lower extremity strength as well as urinary retention and tingling and numbness sensation in a glove and stocking pattern. Had a EMG nerve conduction study at the outpatient  clinic done today that is consistent with AIDP. Transferred to the ED as outpatient IVIG authorization would take a long time. Plan of IVIG versus plasma exchange-listed in the outpatient plan note. He will need admission for either of those procedures but I would wait till the morning, discussed with the outpatient neurologist and then make a determination whether should be IVIG or plasma exchange. Clinical picture timeline still consistent with AIDP and not CIDP yet.  Impression: AIDP  Recommendations: Received IVIG less than 4 weeks ago for total of 5 doses. At this point, I would admit to the hospital and plan to discuss with the outpatient neurologist whether they would prefer IVIG versus plasma exchange at this time.  I would lean towards doing plasmapheresis and in the interim outpatient IVIG treatments can be prior authorized and he should be able to get those done as an outpatient should the need be and should he progress to CIDP. CXR and UA not indicative of any infection. Should be OK to use either modality based on discussion with outpatient team. Plan was discussed with the patient and Dr. Anitra Lauth.   -- Milon Dikes, MD Neurologist [facility] Pager: [phone]

## 2022-10-24 NOTE — ED Provider Triage Note (Signed)
Emergency Medicine Provider Triage Evaluation Note  Joe Greene , a 71 y.o. male  was evaluated in triage.  Pt was sent over by neurology due to being unable to walk without assistance.  He was hospitalized for Guillain-Barre and required infusions.  He states the numbness and weakness has spread up his legs and he is now feeling it in his hands.  His neurologist wanted him sent here for likely hospital admission for further treatment.   Review of Systems  Positive: See above Negative: See above  Physical Exam  BP 106/86   Pulse 78   Temp 97.7 F (36.5 C)   Resp 18   Ht 5\' 10"  (1.778 m)   Wt 94.3 kg   SpO2 97%   BMI 29.83 kg/m  Gen:   Awake, no distress   Resp:  Normal effort, lungs clear to auscultation bilaterally MSK:   Moves extremities without difficulty Other:  Equal strength bilateral upper and lower extremities, light sensation/touch deficit to bilateral lower extremities; patient unable to ambulate  Medical Decision Making  Medically screening exam initiated at 4:26 PM.  Appropriate orders placed.  Joe Greene was informed that the remainder of the evaluation will be completed by another provider, this initial triage assessment does not replace that evaluation, and the importance of remaining in the ED until their evaluation is complete.     Armando Gang R, Melton Alar 10/24/22 [phone]

## 2022-10-24 NOTE — Progress Notes (Signed)
Chief Complaint  Patient presents with   Procedure    Rm 3 with wife       ASSESSMENT AND PLAN  Joe Greene is a 71 y.o. male   Guillain-Barr syndrome  Started since early November 2023 following upper respiratory infection, loss of taste 2 weeks prior to the symptom onset  mild to moderate improvement following IVIG from November 14-18 2023, began to have worsening symptoms again since early December 2023, his symptoms continue to progress, reported worsening ascending paresthesia, gait abnormality, need assistant to transfer him now, also complains of autonomic symptoms, difficulty emptying bladder, urinary frequency, shortness of breath,  Started IVIG outpatient prior authorization process, this will need few weeks complete  After discussed with patient and his wife, with his worsening symptoms, ascending paresthesia, frequent shortness of breath spells, autonomic dysfunction, will refer him back to hospital for treatment, either repeat IVIG or even consider plasma exchange He will make IgA level to complete outpatient IVIG prior authorization process, this can be done during his hospital admission,   DIAGNOSTIC DATA (LABS, IMAGING, TESTING) - I reviewed patient records, labs, notes, testing and imaging myself where available.  MEDICAL HISTORY:  Raziel Koenigs, is a 71 year old male seen in request by his primary care physician Dr. 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 PMHX HTN Smoker, 2ppd, quit since Sep 23 2022.  Patient has been active all his life, began to noticed toes and fingertips numbness tingling on September 17, 2022, prior to that, he has suffered upper respiratory infection for 2 weeks, cough, loss of taste,  By November 10, he noticed progressive numbness upper and lower extremity, and also developed gait  abnormality,  Symptoms continue to progressively getting worse, leading to emergency room presentation September 24, 2022, also had intractable headache,  He was admitted with diagnosis of probable Guillain-Barr, lumbar puncture September 25, 2022 showed wbc, 4, RBC 0,  TP 89, Glucose 80,   He had 5 days of IVIG, began to notice improvement shortly afterwards, continue to make progress, to the point he can ambulate without assistance at home for few days, but since early December, he noticed regression, increased numbness, gait abnormality,   Update October 24, 2022: Patient returns for electrodiagnostic study today, which confirmed acute demyelinating polyradiculoneuropathy, there was no evidence of significant axonal loss He had symptom onset early November, received IVIG from November 14 for 5 consecutive days, he began to notice an after second and third dose, the benefit lasted about 2 weeks, at his best, he could take few steps at home without assistant,  Since beginning of December 2023, 2 weeks after IVIG infusion, he began to notice worsening bilateral lower extremity ascending numbness, weakness, now could barely take a step, sitting in wheelchair most of the time, also frequent shortness of breath episode, sometimes waking up in the middle of the night  Also complains of difficulty emptying his bladder, urinary frequency, baseline elevated heart rate, consistent with autonomic involvement  We have attempted outpatient IVIG infusion, but due to insurance and preauthorization limitations, it needed few weeks to complete the process.  With his worsening symptoms, especially autonomic involvement, shortness of breath, continue progress of symptoms, per patient even worse than prior to treatment level in the middle of November, will refer him to hospital for either repeat IVIG or even consider plasma exchange  PHYSICAL EXAM:  Vitals:   10/24/22 1309  BP: 115/74  Pulse: 85   There is  no height or weight on file to calculate BMI.  PHYSICAL EXAMNIATION:  Gen: NAD, conversant, well nourised, well groomed                     Cardiovascular: Regular rate rhythm, no peripheral edema, warm, nontender. Eyes: Conjunctivae clear without exudates or hemorrhage Neck: Supple, no carotid bruits. Pulmonary: Clear to auscultation bilaterally   NEUROLOGICAL EXAM:  MENTAL STATUS: Speech/cognition: Awake, alert, oriented to history taking and casual conversation, shortness of breath with prolonged talking CRANIAL NERVES: CN II: Visual fields are full to confrontation. Pupils are round equal and briskly reactive to light. CN III, IV, VI: extraocular movement are normal. No ptosis. CN V: Facial sensation is intact to light touch CN VII: Face is symmetric with normal eye closure  CN VIII: Hearing is normal to causal conversation. CN IX, X: Phonation is normal. CN XI: Head turning and shoulder shrug are intact  MOTOR: Mild bilateral shoulder abduction, external rotation weakness, mild bilateral hands grip finger abduction weakness.  Mild bilateral hip flexion, mild to moderate moderate bilateral ankle dorsiflexion, plantarflexion weakness  REFLEXES: Areflexia  SENSORY:  Length-dependent decreased light touch, pinprick sensation to mid shin level, absent vibratory sensation left lower extremity, absent toe and ankle proprioception,  Preserved finger proprioception, and decreased vibratory sense at fingertips  COORDINATION: There is no trunk or limb dysmetria noted.  GAIT/STANCE: Need push-up to get up from sitting position, wide-based, need assistant to transfer him REVIEW OF SYSTEMS:  Full 14 system review of systems performed and notable only for as above All other review of systems were negative.   ALLERGIES: No Known Allergies  HOME MEDICATIONS: Current Outpatient Medications  Medication Sig Dispense Refill   calcium carbonate (TUMS EX) 750 MG chewable tablet Chew  1,500 mg by mouth 2 (two) times daily as needed for heartburn.     docusate sodium (COLACE) 50 MG capsule Take 50 mg by mouth daily.     ferrous sulfate 325 (65 FE) MG EC tablet Take 325 mg by mouth daily. When able to remember     gabapentin (NEURONTIN) 300 MG capsule Take 1 capsule (300 mg total) by mouth 3 (three) times daily. 90 capsule 11   ibuprofen (ADVIL) 200 MG tablet Take 400 mg by mouth 3 (three) times daily as needed for headache or mild pain.     losartan (COZAAR) 25 MG tablet Take 1 tablet (25 mg total) by mouth daily. 30 tablet 1   MAGNESIUM PO Take 1 tablet by mouth daily. When able to remember     Multiple Vitamin (MULTIVITAMIN WITH MINERALS) TABS tablet Take 1 tablet by mouth daily. 30 tablet 1   nicotine (NICODERM CQ - DOSED IN MG/24 HOURS) 21 mg/24hr patch Place 1 patch (21 mg total) onto the skin daily. 28 patch 0   Omega-3 Fatty Acids (FISH OIL PO) Take 1 capsule by mouth daily. When able to remember     sildenafil (VIAGRA) 100 MG tablet Take 0.5-1 tablets (50-100 mg total) by mouth daily as needed for erectile dysfunction. (Patient taking differently: Take 50 mg by mouth daily as needed for erectile dysfunction.) 5 tablet 11   No current facility-administered medications for this visit.    PAST MEDICAL HISTORY: Past Medical History:  Diagnosis Date   AAA (abdominal aortic aneurysm) (HCC)    Cataract    forming     PAST SURGICAL  HISTORY: Past Surgical History:  Procedure Laterality Date   FOOT SURGERY Right    HERNIA REPAIR Right    KNEE ARTHROSCOPY Left    WISDOM TOOTH EXTRACTION      FAMILY HISTORY: Family History  Problem Relation Age of Onset   Stroke Mother 3190   AAA (abdominal aortic aneurysm) Mother    Cancer Father        Prostate   Colon cancer Neg Hx    Colon polyps Neg Hx    Esophageal cancer Neg Hx    Rectal cancer Neg Hx    Stomach cancer Neg Hx     SOCIAL HISTORY: Social History   Socioeconomic History   Marital status: Married     Spouse name: Not on file   Number of children: 2   Years of education: Not on file   Highest education level: Not on file  Occupational History   Occupation: Retired    Comment: Scientist, 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- [employer]  Tobacco Use   Smoking status: Former    Packs/day: 2.00    Years: 50.00    Total pack years: 100.00    Types: Cigarettes    Quit date: 09/18/2022    Years since quitting: 0.0   Smokeless tobacco: Never  Vaping Use   Vaping Use: Never used  Substance and Sexual Activity   Alcohol use: Yes    Alcohol/week: 5.0 standard drinks of alcohol    Types: 5 Standard drinks or equivalent per week    Comment: occ   Drug use: Never   Sexual activity: Yes  Other Topics Concern   Not on file  Social History Narrative   Not on file   Social Determinants of Health   Financial Resource Strain: Low Risk  (11/14/2021)   Overall Financial Resource Strain (CARDIA)    Difficulty of Paying Living Expenses: Not hard at all  Food Insecurity: No Food Insecurity (09/25/2022)   Hunger Vital Sign    Worried About Running Out of Food in the Last Year: Never true    Ran Out of Food in the Last Year: Never true  Transportation Needs: No Transportation Needs (09/25/2022)   PRAPARE - Administrator, Civil ServiceTransportation    Lack of Transportation (Medical): No    Lack of Transportation (Non-Medical): No  Physical Activity: Sufficiently Active (11/14/2021)   Exercise Vital Sign    Days of Exercise per Week: 6 days    Minutes of Exercise per Session: 60 min  Stress: No Stress Concern Present (11/14/2021)   [employer] - Occupational Stress Questionnaire    Feeling of Stress : Not at all  Social Connections: Moderately Integrated (11/14/2021)   Social Connection and Isolation Panel [NHANES]    Frequency of Communication with Friends and Family: Twice a week    Frequency of Social Gatherings with Friends and Family: Twice a week    Attends Religious Services: More than 4 times per year    Active  Member of [employer] Financial[employer]ubs or Organizations: No    Attends BankerClub or Organization Meetings: Never    Marital Status: Married  Catering managerntimate Partner Violence: Not At Risk (09/25/2022)   Humiliation, Afraid, Rape, and Kick questionnaire    Fear of Current or Ex-Partner: No    Emotionally Abused: No    Physically Abused: No    Sexually Abused: No      Levert FeinsteinYijun Rondle Lohse, M.D. Ph.D.  [facility] [address], [address] [place], [place] [address] Ph: [phone] Fax: [phone]  CC:  Loyola Mast, MD [address] [place],  [place] [address]  Loyola Mast, MD

## 2022-10-24 NOTE — ED Notes (Signed)
Urine collected and sent to lab.

## 2022-10-25 ENCOUNTER — Ambulatory Visit: Payer: Medicare PPO | Admitting: Occupational Therapy

## 2022-10-25 ENCOUNTER — Inpatient Hospital Stay (HOSPITAL_COMMUNITY): Payer: Medicare PPO

## 2022-10-25 ENCOUNTER — Ambulatory Visit: Payer: Medicare PPO | Admitting: Physical Therapy

## 2022-10-25 DIAGNOSIS — J438 Other emphysema: Secondary | ICD-10-CM | POA: Diagnosis not present

## 2022-10-25 DIAGNOSIS — E119 Type 2 diabetes mellitus without complications: Secondary | ICD-10-CM | POA: Diagnosis not present

## 2022-10-25 DIAGNOSIS — I7143 Infrarenal abdominal aortic aneurysm, without rupture: Secondary | ICD-10-CM | POA: Diagnosis not present

## 2022-10-25 DIAGNOSIS — Z79899 Other long term (current) drug therapy: Secondary | ICD-10-CM | POA: Diagnosis not present

## 2022-10-25 DIAGNOSIS — R7989 Other specified abnormal findings of blood chemistry: Secondary | ICD-10-CM | POA: Diagnosis not present

## 2022-10-25 DIAGNOSIS — J439 Emphysema, unspecified: Secondary | ICD-10-CM | POA: Diagnosis present

## 2022-10-25 DIAGNOSIS — R5381 Other malaise: Secondary | ICD-10-CM | POA: Diagnosis not present

## 2022-10-25 DIAGNOSIS — E611 Iron deficiency: Secondary | ICD-10-CM

## 2022-10-25 DIAGNOSIS — G610 Guillain-Barre syndrome: Secondary | ICD-10-CM | POA: Diagnosis present

## 2022-10-25 DIAGNOSIS — R339 Retention of urine, unspecified: Secondary | ICD-10-CM | POA: Diagnosis present

## 2022-10-25 DIAGNOSIS — G608 Other hereditary and idiopathic neuropathies: Secondary | ICD-10-CM | POA: Diagnosis present

## 2022-10-25 DIAGNOSIS — Z87891 Personal history of nicotine dependence: Secondary | ICD-10-CM | POA: Diagnosis not present

## 2022-10-25 DIAGNOSIS — R110 Nausea: Secondary | ICD-10-CM | POA: Diagnosis not present

## 2022-10-25 DIAGNOSIS — G6181 Chronic inflammatory demyelinating polyneuritis: Secondary | ICD-10-CM | POA: Diagnosis present

## 2022-10-25 DIAGNOSIS — M47812 Spondylosis without myelopathy or radiculopathy, cervical region: Secondary | ICD-10-CM | POA: Diagnosis not present

## 2022-10-25 DIAGNOSIS — I10 Essential (primary) hypertension: Secondary | ICD-10-CM | POA: Diagnosis present

## 2022-10-25 DIAGNOSIS — I714 Abdominal aortic aneurysm, without rupture, unspecified: Secondary | ICD-10-CM | POA: Diagnosis present

## 2022-10-25 HISTORY — PX: IR FLUORO GUIDE CV LINE RIGHT: IMG2283

## 2022-10-25 HISTORY — PX: IR US GUIDE VASC ACCESS RIGHT: IMG2390

## 2022-10-25 LAB — CBC
HCT: 46.3 % (ref 39.0–52.0)
Hemoglobin: 15.6 g/dL (ref 13.0–17.0)
MCH: 33.1 pg (ref 26.0–34.0)
MCHC: 33.7 g/dL (ref 30.0–36.0)
MCV: 98.1 fL (ref 80.0–100.0)
Platelets: 246 10*3/uL (ref 150–400)
RBC: 4.72 MIL/uL (ref 4.22–5.81)
RDW: 12.8 % (ref 11.5–15.5)
WBC: 7.2 10*3/uL (ref 4.0–10.5)
nRBC: 0 % (ref 0.0–0.2)

## 2022-10-25 LAB — BASIC METABOLIC PANEL
Anion gap: 10 (ref 5–15)
BUN: 24 mg/dL — ABNORMAL HIGH (ref 8–23)
CO2: 23 mmol/L (ref 22–32)
Calcium: 9.5 mg/dL (ref 8.9–10.3)
Chloride: 106 mmol/L (ref 98–111)
Creatinine, Ser: 1.01 mg/dL (ref 0.61–1.24)
GFR, Estimated: >60 mL/min (ref >60)
Glucose, Bld: 127 mg/dL — ABNORMAL HIGH (ref 70–99)
Potassium: 4.1 mmol/L (ref 3.5–5.1)
Sodium: 139 mmol/L (ref 135–145)

## 2022-10-25 LAB — MAGNESIUM: Magnesium: 2 mg/dL (ref 1.7–2.4)

## 2022-10-25 MED ORDER — HEPARIN SODIUM (PORCINE) 1000 UNIT/ML IJ SOLN
INTRAMUSCULAR | Status: AC
  Filled 2022-10-25: qty 10

## 2022-10-25 MED ORDER — LIDOCAINE HCL 1 % IJ SOLN
INTRAMUSCULAR | Status: AC
  Filled 2022-10-25: qty 20

## 2022-10-25 NOTE — Progress Notes (Signed)
  Progress Note   Patient: Joe Greene PFX:[id number] DOB: [birth year] DOA: 10/24/2022     0 DOS: the patient was seen and examined on 10/25/2022   Brief hospital course: Mr. Ziebell was admitted to the hospital with the working diagnosis of CIDP.   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 At home he had recurrent and worsening ascending paresthesias, urinary retention, difficulty ambulating, not able to stand and neuropathic pain. Patient was evaluated by neurology as outpatient, and had a nerve conduction study which showed acute inflammatory demyelinating neuropathy. Patient was referred to the hospital for further management.   Neurology has been consulted with recommendations for plasmapheresis.  IR consulted for catheter placement.    Assessment and Plan: * Acute inflammatory demyelinating polyneuropathy (HCC) Patient with persistent weakness in his lower extremities, no dyspnea.  Plan to continue neuro checks q 4 hrs Pending central venous catheter placement for plasmapheresis, follow up with Neurology recommendations.   Essential hypertension Continue blood pressure monitoring.  Systolic blood pressure 130 to 140 mmHg.  Continue with losartan for blood pressure control.   AAA (abdominal aortic aneurysm) (HCC) Follow up as outpatient, seems to be stable.   Acute sensory neuropathy Continue with plasmapheresis per neurology recommendations.  Continue with gabapentin.   Emphysema of lung (HCC) No signs of acute exacerbation, continue oxymetry monitoring.   Iron deficiency Cell count has been stable.         Subjective: Patient with persistent weakness at his lower extremities, with no dyspnea or chest pain,.   Physical Exam: Vitals:   10/25/22 0700 10/25/22 0906 10/25/22 1000 10/25/22 1327  BP: (!) 151/95 126/82 130/81 (!) 147/80  Pulse: 70 85 89 85  Resp: 14 19 (!) 22 17  Temp:   98.2 F (36.8 C)  98 F (36.7 C)  TempSrc:  Oral  Oral  SpO2: 94% 97% 93% 95%  Weight:      Height:       Neurology awake and alert, lower extremities weakness  ENT with no pallor Cardiovascular with S1 and S2 present and rhythmic with no gallops, rubs or murmurs Respiratory with no rales or wheezing, good inspiratory effort Abdomen with no distention  No lower extremity edema  Data Reviewed:    Family Communication: I spoke with patient's wife at the bedside, we talked in detail about patient's condition, plan of care and prognosis and all questions were addressed.   Disposition: Status is: Inpatient Remains inpatient appropriate because: IV therapies for neuropathy   Planned Discharge Destination: Home  Author: Coralie Keens, MD 10/25/2022 4:40 PM  For on call review [email or web address].

## 2022-10-25 NOTE — ED Notes (Signed)
Pt received breakfast tray, no other needs at this time.

## 2022-10-25 NOTE — Assessment & Plan Note (Deleted)
Continue with plasmapheresis per neurology recommendations.  Continue with gabapentin.

## 2022-10-25 NOTE — ED Notes (Signed)
Neurology PA at bedside

## 2022-10-25 NOTE — Assessment & Plan Note (Addendum)
-   Stable. -Outpatient follow-up. 

## 2022-10-25 NOTE — ED Notes (Signed)
Two nursing assistants and one nurse at this time helped to stand patient. Patient was leaning to the left and backwards during this time. Pt needs assist of three in order to stand.

## 2022-10-25 NOTE — Assessment & Plan Note (Addendum)
Stable. No signs of exacerbation

## 2022-10-25 NOTE — ED Notes (Signed)
Patient taken to IR

## 2022-10-25 NOTE — ED Notes (Signed)
Patient returned from IR. Denies further needs at this time.

## 2022-10-25 NOTE — ED Notes (Signed)
Updated MD on patient condition.

## 2022-10-25 NOTE — ED Notes (Signed)
This RN spoke to IR in regards to if this patient was on the schedule. IR states patient is on the schedule. This message of being on the schedule was told to patient and family.

## 2022-10-25 NOTE — Assessment & Plan Note (Addendum)
Patient is managed on iron supplementation as an outpatient. No associated anemia.

## 2022-10-25 NOTE — Progress Notes (Signed)
   Patient Status: [facility] - ED  Assessment and Plan: Patient in need of venous access.   Need for non tunneled plasma pheresis catheter placement  ______________________________________________________________________   History of Present Illness: Joe Greene is a 71 y.o. male   Recent Dx Joe Greene Worsening weakness; numbness of extremities Had 5 days IVIG in November - with good improvement Now in ED for admission and plans for plasma pheresis  Scheduled for non tunneled catheter for same Today if IR schedule permits--- I not - will be 12/15 am  Allergies and medications reviewed.   Review of Systems: A 12 point ROS discussed and pertinent positives are indicated in the HPI above.  All other systems are negative.  Vital Signs: BP (!) 147/80 (BP Location: Right Arm)   Pulse 85   Temp 98 F (36.7 C) (Oral)   Resp 17   Ht 5\' 10"  (1.778 m)   Wt 207 lb 14.3 oz (94.3 kg)   SpO2 95%   BMI 29.83 kg/m   Physical Exam Vitals reviewed.  Cardiovascular:     Rate and Rhythm: Normal rate and regular rhythm.     Heart sounds: Normal heart sounds.  Pulmonary:     Breath sounds: Normal breath sounds.  Abdominal:     Palpations: Abdomen is soft.  Skin:    General: Skin is warm.  Neurological:     Mental Status: He is alert and oriented to person, place, and time.  Psychiatric:        Behavior: Behavior normal.      Imaging reviewed.   Labs:  COAGS: No results for input(s): "INR", "APTT" in the last 8760 hours.  BMP: Recent Labs    09/21/22 1705 09/24/22 1240 10/10/22 1146 10/24/22 1648 10/25/22 0326  NA 136 137 138 138 139  K 4.0 4.0 4.7 4.2 4.1  CL 104 104 104 106 106  CO2 22 23 27  21* 23  GLUCOSE 131* 172* 81 110* 127*  BUN 19 22 24* 20 24*  CALCIUM 8.9 8.8* 9.7 9.9 9.5  CREATININE 1.02 0.98 0.90 0.90 1.01  GFRNONAA >60 >60  --  >60 >60    Scheduled for non tunneled plasma pheresis catheter in IR 12/14 or 12/15 Pt is aware of procedure benefits  and risks  Agreeable to proceed Consent signed and in chart   Electronically Signed: 1/15, PA-C 10/25/2022, 1:45 PM   I spent a total of 15 minutes in face to face in clinical consultation, greater than 50% of which was counseling/coordinating care for venous access.

## 2022-10-25 NOTE — Assessment & Plan Note (Addendum)
Patient was admitted to the medical ward and was placed on remote telemetry monitoring. Neuro checks.  Q 12 hrs NIF and Vital capacity monitoring.  HD catheter was placed and patient underwent plasma exchange.   Completed 5 treatments with improvement of his symptoms but not yet back to baseline.  Patient was evaluated by PT and OT with recommendations for continue therapy at inpatient rehab.  Gabapentin for neuropathic pain. As needed ibuprofen.   Discussed with neurology and no further plans for plasma exchange, will plan to remove HD catheter, IR has been consulted and will follow patient in inpatient rehab to coordinate removal of the catheter.

## 2022-10-25 NOTE — Progress Notes (Signed)
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  Patient was seen here for Guillain-Barr syndrome mid November and received 5 days of IVIG with significant improvement.  Last week, he noticed worsening weakness as well as numbness of all 4 extremities with very little sensation below his knees.  Patient states that weakness is worse in his legs than his arms, and it is progressed to the point where he is unsure if he can stand up right now.  He was seen by his outpatient neurologist and was directed to come to the emergency department for further treatment with PLEX.  Subjective: Reports that his weakness is getting much worse, and that 2 weeks ago he was able to walk unassisted but now does not feel that he could stand up even with a walker.  He also complains of numbness in the hands as well as below his knees.  Exam: Vitals:   10/25/22 0655 10/25/22 0700  BP: (!) 137/94 (!) 151/95  Pulse: 69 70  Resp: 16 14  Temp: 98 F (36.7 C)   SpO2: 95% 94%   Gen: In bed, NAD Resp: non-labored breathing, no acute distress Abd: soft, nt  Neuro: Mental Status: Alert and oriented to person place time and situation, able to give a clear and coherent history of his illness, able to follow one-step and two-step commands easily Cranial Nerves: Pupils equal round and reactive, extraocular movements intact, facial sensation symmetrical, face symmetrical, phonation normal, hearing intact to voice, shoulder shrug symmetrical, tongue midline Motor: 4+ out of 5 strength in bilateral upper extremities, appears symmetrical although patient states that right side is subjectively weaker than left, equal proximally and distally.  4- out of 5 strength in bilateral hip flexors, 4 out of 5 strength with bilateral knee flexion and extension, 4+ out of 5 strength in dorsiflexion and plantarflexion Sensory:  Numbness noted below the knees and in bilateral hands DTR: Unable to elicit Gait: Deferred  Pertinent Labs:    Latest Ref Rng & Units 10/25/2022    3:26 AM 10/24/2022    4:48 PM 10/10/2022   11:46 AM  CBC  WBC 4.0 - 10.5 K/uL 7.2  8.3  7.3   Hemoglobin 13.0 - 17.0 g/dL 02.5  42.7  06.2   Hematocrit 39.0 - 52.0 % 46.3  46.0  46.9   Platelets 150 - 400 K/uL 246  258  217.0        Latest Ref Rng & Units 10/25/2022    3:26 AM 10/24/2022    4:48 PM 10/10/2022   11:46 AM  BMP  Glucose 70 - 99 mg/dL 376  283  81   BUN 8 - 23 mg/dL 24  20  24    Creatinine 0.61 - 1.24 mg/dL  1.51  7.61   Sodium 135 - 145 mmol/L 139  138  138   Potassium 3.5 - 5.1 mmol/L 4.1  4.2  4.7   Chloride 98 - 111 mmol/L 106  106  104   CO2 22 - 32 mmol/L 23  21  27    Calcium 8.9 - 10.3 mg/dL 9.5  9.9  9.7      Imaging Reviewed:  No relevant imaging performed this admission  Assessment: 71 year old patient with history of Guillain-Barr syndrome treated with IVIG last month reported initial improvement, but worsening weakness began last week and has progressed to the point where patient is unsure if he  can stand.  He does also verbalize that he is having some urinary retention, but states he is not sure if this is because it is difficult to stand up.  Patient states that it has become more difficult for him to do his physical therapy exercises and that he does not feel he can walk even with a walker at this point.  He had seen his outpatient neurologist, Dr. Terrace Arabia, and was directed to come to the emergency department for further treatment with PLEX.   - Benefits versus risks of PLEX have been discussed with the patient, and he is in agreement with proceeding. He also consents to receiving blood products in the course of this treatment. - Discussed with Dr. Terrace Arabia, who feels that PLEX is a better option for treatment this admission than IVIG.   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 If possible begin treatment today, otherwise can start tomorrow 3) NIF and vital capacity every 12 hours 4) PT/OT  Cortney E Ernestina Columbia , MSN, AGACNP-BC [facility] See Amion for schedule and pager information 10/25/2022 8:46 AM   Electronically signed: Dr. Caryl Pina

## 2022-10-25 NOTE — ED Notes (Signed)
Pt assisted to transfer to hospital bed at this time. Pt weak and minimal 2 person assist to stand.

## 2022-10-25 NOTE — Hospital Course (Addendum)
[name] was admitted to the hospital with the working diagnosis of acute demyelinating polyneuropathy.    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 At home he had recurrent and worsening ascending paresthesias, urinary retention, difficulty ambulating, not able to stand and persistent neuropathic pain. Patient was evaluated by neurology as outpatient, and had a nerve conduction study which showed acute inflammatory demyelinating neuropathy. Patient was referred to the hospital for further management.    Na 138, K 4.2 Cl 106, bicarbonate 21, glucose 110 bun 20 cr 0,90  Wbc 8,3 hgb 15.7 plt 258  Urine analysis with SG 1,021, pH 5,0, negative protein and negative leukocytes.    Chest radiograph with no cardiomegaly, no infiltrates or effusions.    EKG 77 bpm, normal axis, normal intervals, sinus rhythm with no significant ST segment or T wave changes.    Neurology has been consulted with recommendations for plasmapheresis.  IR consulted for HD catheter placement.    12/15 Plasma exchange (1st), NIF greater than -40 and VC 2,7 L.  12/16 Plan to continue plasma exchange Monday, Tuesday, Thursday and Friday.  12/17 medically stable, continue to have paresthesias upper and lower extremities. Respiratory not compromised.  12/22. Patient had plasma exchange on 12/21 and will have another treatment today. Neurologically improving but not back yet to baseline.  He will be transferred to inpatient rehab.

## 2022-10-25 NOTE — ED Notes (Signed)
Patient helped to stand to urinate. Patient was in need of 2 people to assist while standing. Patient is very weak.

## 2022-10-25 NOTE — Assessment & Plan Note (Addendum)
His blood pressure remained well controlled, continue with losartan.

## 2022-10-25 NOTE — Procedures (Signed)
Interventional Radiology Procedure Note  Procedure: RT IJ TEMP CATH  FOR PHERESIS  Complications: None  Estimated Blood Loss:  MIN  Findings: TIP SVCRA    Sharen Counter, MD

## 2022-10-25 NOTE — ED Notes (Signed)
Pt reports that he has loss fine motor ability in both of his hands.

## 2022-10-26 ENCOUNTER — Telehealth: Payer: Self-pay | Admitting: Neurology

## 2022-10-26 DIAGNOSIS — I7143 Infrarenal abdominal aortic aneurysm, without rupture: Secondary | ICD-10-CM | POA: Diagnosis not present

## 2022-10-26 DIAGNOSIS — J439 Emphysema, unspecified: Secondary | ICD-10-CM | POA: Diagnosis not present

## 2022-10-26 DIAGNOSIS — I10 Essential (primary) hypertension: Secondary | ICD-10-CM | POA: Diagnosis not present

## 2022-10-26 DIAGNOSIS — G610 Guillain-Barre syndrome: Secondary | ICD-10-CM | POA: Diagnosis not present

## 2022-10-26 LAB — CBC
HCT: 45.2 % (ref 39.0–52.0)
Hemoglobin: 15.5 g/dL (ref 13.0–17.0)
MCH: 33.6 pg (ref 26.0–34.0)
MCHC: 34.3 g/dL (ref 30.0–36.0)
MCV: 98 fL (ref 80.0–100.0)
Platelets: 241 10*3/uL (ref 150–400)
RBC: 4.61 MIL/uL (ref 4.22–5.81)
RDW: 12.9 % (ref 11.5–15.5)
WBC: 7.4 10*3/uL (ref 4.0–10.5)
nRBC: 0 % (ref 0.0–0.2)

## 2022-10-26 LAB — BASIC METABOLIC PANEL
Anion gap: 11 (ref 5–15)
BUN: 23 mg/dL (ref 8–23)
CO2: 24 mmol/L (ref 22–32)
Calcium: 9.1 mg/dL (ref 8.9–10.3)
Chloride: 105 mmol/L (ref 98–111)
Creatinine, Ser: 0.97 mg/dL (ref 0.61–1.24)
GFR, Estimated: >60 mL/min (ref >60)
Glucose, Bld: 120 mg/dL — ABNORMAL HIGH (ref 70–99)
Potassium: 4.1 mmol/L (ref 3.5–5.1)
Sodium: 140 mmol/L (ref 135–145)

## 2022-10-26 MED ORDER — CALCIUM CARBONATE ANTACID 500 MG PO CHEW
2.0000 | CHEWABLE_TABLET | ORAL | Status: AC
  Administered 2022-10-26 (×2): 400 mg via ORAL
  Filled 2022-10-26 (×2): qty 2

## 2022-10-26 MED ORDER — ACETAMINOPHEN 325 MG PO TABS
650.0000 mg | ORAL_TABLET | ORAL | Status: DC | PRN
  Administered 2022-10-26: 650 mg via ORAL
  Filled 2022-10-26 (×2): qty 2

## 2022-10-26 MED ORDER — DIPHENHYDRAMINE HCL 25 MG PO CAPS: 25.0000 mg | ORAL_CAPSULE | Freq: Four times a day (QID) | ORAL | Status: DC | PRN

## 2022-10-26 MED ORDER — CALCIUM GLUCONATE-NACL 2-0.675 GM/100ML-% IV SOLN: 2.0000 g | Freq: Once | INTRAVENOUS | Status: DC

## 2022-10-26 MED ORDER — CALCIUM CARBONATE ANTACID 500 MG PO CHEW: 2.0000 | CHEWABLE_TABLET | ORAL | Status: DC

## 2022-10-26 MED ORDER — NICOTINE 14 MG/24HR TD PT24
14.0000 mg | MEDICATED_PATCH | Freq: Every day | TRANSDERMAL | Status: DC
  Administered 2022-10-26 – 2022-11-02 (×8): 14 mg via TRANSDERMAL
  Filled 2022-10-26 (×8): qty 1

## 2022-10-26 MED ORDER — CHLORHEXIDINE GLUCONATE CLOTH 2 % EX PADS
6.0000 | MEDICATED_PAD | Freq: Every day | CUTANEOUS | Status: DC
  Administered 2022-10-26 – 2022-11-01 (×7): 6 via TOPICAL

## 2022-10-26 MED ORDER — SODIUM CHLORIDE 0.9 % IV SOLN: Freq: Once | INTRAVENOUS | Status: DC

## 2022-10-26 MED ORDER — ACD FORMULA A 0.73-2.45-2.2 GM/100ML VI SOLN
1000.0000 mL | Status: DC
  Administered 2022-10-26: 1000 mL
  Filled 2022-10-26 (×3): qty 1000

## 2022-10-26 MED ORDER — HEPARIN SODIUM (PORCINE) 1000 UNIT/ML IJ SOLN
1000.0000 [IU] | Freq: Once | INTRAMUSCULAR | Status: AC
  Administered 2022-10-26: 1000 [IU]
  Filled 2022-10-26: qty 1

## 2022-10-26 MED ORDER — ACD FORMULA A 0.73-2.45-2.2 GM/100ML VI SOLN: 1000.0000 mL | Status: DC

## 2022-10-26 MED ORDER — DIPHENHYDRAMINE HCL 25 MG PO CAPS
25.0000 mg | ORAL_CAPSULE | Freq: Four times a day (QID) | ORAL | Status: DC | PRN
  Administered 2022-10-26: 25 mg via ORAL
  Filled 2022-10-26: qty 1

## 2022-10-26 MED ORDER — HEPARIN SODIUM (PORCINE) 1000 UNIT/ML IJ SOLN: 1000.0000 [IU] | Freq: Once | INTRAMUSCULAR | Status: DC

## 2022-10-26 MED ORDER — ACETAMINOPHEN 325 MG PO TABS: 650.0000 mg | ORAL_TABLET | ORAL | Status: DC | PRN

## 2022-10-26 MED ORDER — SODIUM CHLORIDE 0.9 % IV SOLN
INTRAVENOUS | Status: AC
  Filled 2022-10-26 (×3): qty 200

## 2022-10-26 MED ORDER — HEPARIN SODIUM (PORCINE) 1000 UNIT/ML IJ SOLN
INTRAMUSCULAR | Status: AC
  Filled 2022-10-26: qty 3

## 2022-10-26 MED ORDER — CALCIUM GLUCONATE-NACL 2-0.675 GM/100ML-% IV SOLN
2.0000 g | Freq: Once | INTRAVENOUS | Status: AC
  Administered 2022-10-26: 2000 mg via INTRAVENOUS
  Filled 2022-10-26 (×2): qty 100

## 2022-10-26 NOTE — Progress Notes (Signed)
NIF and VC parameters not taken at this time due to patient nausea.

## 2022-10-26 NOTE — Progress Notes (Signed)
Pt. Complaint of dizzyness and getting weak and clammy. RN monitor pt.   VSS remain stable. Pt. States, no change, weak  and light headed. Notified Floor Nurse LPN Stacie Glaze with results

## 2022-10-26 NOTE — Progress Notes (Addendum)
  Progress Note   Patient: Joe Greene MWN:[id number] DOB: [birth year] DOA: 10/24/2022     1 DOS: the patient was seen and examined on 10/26/2022   Brief hospital course: Joe Greene was admitted to the hospital with the working diagnosis of CIDP.   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 At home he had recurrent and worsening ascending paresthesias, urinary retention, difficulty ambulating, not able to stand and neuropathic pain. Patient was evaluated by neurology as outpatient, and had a nerve conduction study which showed acute inflammatory demyelinating neuropathy. Patient was referred to the hospital for further management.    EKG 77 bpm, normal axis, normal intervals, sinus rhythm with no significant ST segment or T wave changes.   Neurology has been consulted with recommendations for plasmapheresis.  IR consulted for catheter placement.   12/15 plan for plasma exchange today, NIF greater than -40 and VC 2,7 L.   Assessment and Plan: * Acute inflammatory demyelinating polyneuropathy (HCC) Patient with persistent weakness in his lower extremities, no dyspnea. Nif is greater than - 40 and VC is 2,7 L.  Continue with gabapentin for neuropathic pain.  Plan for plasma exchange today, he has a HD cathter in placed right IJ.    Essential hypertension Continue blood pressure control with losartan.  Systolic blood pressure has been 130 to 140 mmHg.   AAA (abdominal aortic aneurysm) (HCC) Follow up as outpatient, seems to be stable.   Acute sensory neuropathy Continue with plasmapheresis per neurology recommendations.  Continue with gabapentin.   Emphysema of lung (HCC) No signs of acute exacerbation, continue oxymetry monitoring.   Iron deficiency Cell count has been stable.         Subjective: Patient continue to have lower extremity weakness but not dyspnea,   Physical Exam: Vitals:    10/26/22 0751 10/26/22 1000 10/26/22 1100 10/26/22 1206  BP:  137/86 (!) 144/85 (!) 132/91  Pulse:  79 81 83  Resp:  17 (!) 23 18  Temp: 97.7 F (36.5 C)  97.8 F (36.6 C) 98 F (36.7 C)  TempSrc: Oral  Oral Oral  SpO2:  96% 96% 97%  Weight:    92.2 kg  Height:       Neurology awake and alert, continue to have lower extremity weakness bilaterally Cardiovascular with S1 and S2 present and rhythmic with no gallops Respiratory with no rales or wheezing, good inspiratory effort Abdomen with no distention  No  lower extremity edema  Data Reviewed:    Family Communication: I spoke with patient's wife at the bedside, we talked in detail about patient's condition, plan of care and prognosis and all questions were addressed.  Disposition: Status is: Inpatient Remains inpatient appropriate because: pending plasma exchange   Planned Discharge Destination: Home      Author: Coralie Keens, MD 10/26/2022 1:55 PM  For on call review [email or web address].

## 2022-10-26 NOTE — ED Notes (Signed)
Pt in room A&O x4. Assisted to edge of bed to use urinal.Pt is a +2 assist. Updated on plan of care. Pt inquiring about treatment. Reports getting impatient seeing how he has been here for over 36hrs.

## 2022-10-26 NOTE — Progress Notes (Signed)
NIF: greater than -40 VC: 2.7 L  With excellent patient effort.

## 2022-10-26 NOTE — Telephone Encounter (Signed)
Pt's wife, Tania Perrott (on [place]) Directed on Wednesday by Dr. Terrace Arabia to take my husband to the ED because disease was progressing.   Have been here since at 2:30 pm. Asking on call physician if can call the ED at [facility] and have them do plasma exchange phoresis in ED or move upstairs immediately.   The disease is progressing up to his pelvis. If reaches lungs my husband will refuse being put on a ventilator.  Have been waiting for a room for 38 hours.  Pt's wife insisted I send a message to the on call physician for help.

## 2022-10-26 NOTE — Plan of Care (Signed)
  Problem: Education: Goal: Knowledge of disease or condition will improve Outcome: Progressing   

## 2022-10-27 DIAGNOSIS — E611 Iron deficiency: Secondary | ICD-10-CM | POA: Diagnosis not present

## 2022-10-27 DIAGNOSIS — G610 Guillain-Barre syndrome: Secondary | ICD-10-CM | POA: Diagnosis not present

## 2022-10-27 DIAGNOSIS — I10 Essential (primary) hypertension: Secondary | ICD-10-CM | POA: Diagnosis not present

## 2022-10-27 DIAGNOSIS — I7143 Infrarenal abdominal aortic aneurysm, without rupture: Secondary | ICD-10-CM | POA: Diagnosis not present

## 2022-10-27 LAB — BASIC METABOLIC PANEL
Anion gap: 10 (ref 5–15)
BUN: 25 mg/dL — ABNORMAL HIGH (ref 8–23)
CO2: 19 mmol/L — ABNORMAL LOW (ref 22–32)
Calcium: 9.1 mg/dL (ref 8.9–10.3)
Chloride: 109 mmol/L (ref 98–111)
Creatinine, Ser: 1.14 mg/dL (ref 0.61–1.24)
GFR, Estimated: >60 mL/min (ref >60)
Glucose, Bld: 128 mg/dL — ABNORMAL HIGH (ref 70–99)
Potassium: 4.5 mmol/L (ref 3.5–5.1)
Sodium: 138 mmol/L (ref 135–145)

## 2022-10-27 LAB — CBC
HCT: 47.5 % (ref 39.0–52.0)
Hemoglobin: 16.9 g/dL (ref 13.0–17.0)
MCH: 33.7 pg (ref 26.0–34.0)
MCHC: 35.6 g/dL (ref 30.0–36.0)
MCV: 94.8 fL (ref 80.0–100.0)
Platelets: 203 10*3/uL (ref 150–400)
RBC: 5.01 MIL/uL (ref 4.22–5.81)
RDW: 13 % (ref 11.5–15.5)
WBC: 11.4 10*3/uL — ABNORMAL HIGH (ref 4.0–10.5)
nRBC: 0 % (ref 0.0–0.2)

## 2022-10-27 LAB — IGA: IgA: 192 mg/dL (ref 61–437)

## 2022-10-27 NOTE — Progress Notes (Signed)
NIF: greater than 40 VC:1.4L

## 2022-10-27 NOTE — Progress Notes (Signed)
  Progress Note   Patient: Joe Greene KGU:[id number] DOB: [birth year] DOA: 10/24/2022     2 DOS: the patient was seen and examined on 10/27/2022   Brief hospital course: Mr. Weitzel was admitted to the hospital with the working diagnosis of CIDP.   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 At home he had recurrent and worsening ascending paresthesias, urinary retention, difficulty ambulating, not able to stand and neuropathic pain. Patient was evaluated by neurology as outpatient, and had a nerve conduction study which showed acute inflammatory demyelinating neuropathy. Patient was referred to the hospital for further management.    EKG 77 bpm, normal axis, normal intervals, sinus rhythm with no significant ST segment or T wave changes.   Neurology has been consulted with recommendations for plasmapheresis.  IR consulted for catheter placement.   12/15 Plasma exchange (1st), NIF greater than -40 and VC 2,7 L.  12/16 Plan to continue plasma exchange Monday, Tuesday, Thursday and Friday.   Assessment and Plan: * Acute inflammatory demyelinating polyneuropathy (HCC) Patient had his 1st plasma exchange with subjective improvement in his lower extremity weakness.  Will have 4 more therapies next week, Monday, Tuesday, Thursday and Friday.   Plan to continue to monitor NIF and VC  Gabapentin for neuropathic pain.  PT and OT as tolerated.   Essential hypertension Continue blood pressure control with losartan.  Systolic blood pressure has been 130 to 140 mmHg.   AAA (abdominal aortic aneurysm) (HCC) Follow up as outpatient, seems to be stable.   Acute sensory neuropathy Continue with plasmapheresis per neurology recommendations.  Continue with gabapentin.   Emphysema of lung (HCC) No signs of acute exacerbation, continue oxymetry monitoring.   Iron deficiency Cell count has been stable.          Subjective: Patient with subjective improvement in lower extremity weakness, no dyspnea. He had nausea yesterday at the end of plasma exchange therapy.   Physical Exam: Vitals:   10/27/22 0016 10/27/22 0605 10/27/22 0817 10/27/22 0931  BP: 124/78 129/74 128/81 122/73  Pulse: 76 78  83  Resp: 20 20  18   Temp: 98.3 F (36.8 C) 97.7 F (36.5 C) 98.7 F (37.1 C) 98.6 F (37 C)  TempSrc: Oral Oral Oral Oral  SpO2: 96% 92%  95%  Weight:  94.1 kg    Height:       Neurology awake and alert. Lower extremity weakness.  ENT with mild pallor Cardiovascular with S1 and S2 present and rhythmic Respiratory with no rales or wheezing Abdomen with no distention  No lower extremity edema  Data Reviewed:    Family Communication: no family at the bedside   Disposition: Status is: Inpatient Remains inpatient appropriate because: inpatient plasma exchange   Planned Discharge Destination: Home     Author: , MD 10/27/2022 11:16 AM  For on call review [email or web address].

## 2022-10-27 NOTE — Progress Notes (Signed)
NIF: Greater than 40 VC: 1.4L  Pt had great effort.

## 2022-10-28 DIAGNOSIS — E611 Iron deficiency: Secondary | ICD-10-CM | POA: Diagnosis not present

## 2022-10-28 DIAGNOSIS — G610 Guillain-Barre syndrome: Secondary | ICD-10-CM | POA: Diagnosis not present

## 2022-10-28 DIAGNOSIS — I7143 Infrarenal abdominal aortic aneurysm, without rupture: Secondary | ICD-10-CM | POA: Diagnosis not present

## 2022-10-28 DIAGNOSIS — I10 Essential (primary) hypertension: Secondary | ICD-10-CM | POA: Diagnosis not present

## 2022-10-28 NOTE — Progress Notes (Signed)
NIF: > -40  VC: 1.3L With good pt effort

## 2022-10-28 NOTE — Progress Notes (Signed)
Inpatient Rehab Admissions Coordinator Note:   Per therapy patient was screened for CIR candidacy by Dottie Vaquerano Luvenia Starch, CCC-SLP. At this time, pt is not medically ready for CIR. Pt has 4 pending plasma exchange therapies. CIR admissions team will follow to monitor for medical readiness. A consult order will be placed if pt appears to be an appropriate candidate.    Wolfgang Phoenix, MS, CCC-SLP Admissions Coordinator [phone] 10/28/22 3:02 PM

## 2022-10-28 NOTE — Progress Notes (Signed)
Pt reported hands has worsening numbness, not able to hold the utensils properly when eating, also reported numbness of the pelvic area that started yesterday. Pt was not able to know when he finished voiding. Attending doctor and Neurologist were informed.   10/28/22 1500  Provider Notification  Provider Name/Title Caryl Pina neurologist.  Date Provider Notified 10/28/22  Time Provider Notified 1500  Method of Notification Page  Notification Reason Change in status;Other (Comment)  Provider response No new orders

## 2022-10-28 NOTE — Evaluation (Signed)
Occupational Therapy Evaluation Patient Details Name: Joe Greene MRN: [id number] DOB: [birth year] Today's Date: 10/28/2022   History of Present Illness Pt is a 71 y.o. M who presents 10/24/2022 with Acute inflammatory demyelinating polyneuropathy. 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 Pt reports worsening symptoms today including numbness from feet to hips/perineal area, further up arms, and decreased dexterity in hands.Pt requiring min A with STS transfer, but noting BLE mild buckling, thus pt will require +2 A for participation in functional mobility for safety. Pt able to don socks, but requiring multiple attempts due to decreased dexterity in hands, and inablity to feel socks getting caught on second toe when attempting to don. Pt's RN notified of increasing symptoms today. Pt with decreased sensation, strength, balance, proprioception, dexterity, and activity tolerance. Due to pt support and significant change in functional status, highly recommending discharge to AIR to optimize safety and independence in ADL and IADL.      Recommendations for follow up therapy are one component of a multi-disciplinary discharge planning process, led by the attending physician.  Recommendations may be updated based on patient status, additional functional criteria and insurance authorization.   Follow Up Recommendations  Acute inpatient rehab (3hours/day)     Assistance Recommended at Discharge Intermittent Supervision/Assistance  Patient can return home with the following Two people to help with walking and/or transfers;A little help with bathing/dressing/bathroom;Assistance with cooking/housework;Assist for transportation;Help with stairs or ramp for entrance    Functional Status Assessment  Patient has had a recent decline in their functional status and demonstrates the ability to make  significant improvements in function in a reasonable and predictable amount of time.  Equipment Recommendations  Other (comment) (defer to next venue)    Recommendations for Other Services       Precautions / Restrictions Precautions Precautions: Fall Restrictions Weight Bearing Restrictions: No      Mobility Bed Mobility Overal bed mobility: Needs Assistance Bed Mobility: Sit to Supine       Sit to supine: Min guard, HOB elevated   General bed mobility comments: Min guard A for safety; fairly incoordinated and near EOB with trunk descent    Transfers Overall transfer level: Needs assistance Equipment used: Rolling walker (2 wheels) Transfers: Sit to/from Stand Sit to Stand: Min assist           General transfer comment: Min assist for boost to stand from low bed setting, cues for hand placement and to prevent excessive pressure on RW. Once upright shows some spontaneous buckling of LEs but stable with UE support on RW.      Balance Overall balance assessment: Needs assistance Sitting-balance support: No upper extremity supported, Feet supported Sitting balance-Leahy Scale: Fair     Standing balance support: Bilateral upper extremity supported Standing balance-Leahy Scale: Poor                             ADL either performed or assessed with clinical judgement   ADL Overall ADL's : Needs assistance/impaired Eating/Feeding: Set up;Sitting   Grooming: Set up;Sitting   Upper Body Bathing: Set up;Sitting   Lower Body Bathing: Minimal assistance;Sit to/from stand   Upper Body Dressing : Set up;Sitting   Lower Body Dressing: Set up;Sitting/lateral leans Lower Body Dressing Details (indicate cue type and reason): donning socks EOB with supervision. Toilet Transfer: Minimal assistance;Rolling walker (2 wheels) Toilet  Transfer Details (indicate cue type and reason): for STS this session. need +2 for functional mobility Toileting- Clothing  Manipulation and Hygiene: Sit to/from stand;Moderate assistance Toileting - Clothing Manipulation Details (indicate cue type and reason): Wife holding urinal to use in standing position.     Functional mobility during ADLs: Minimal assistance;Rolling walker (2 wheels) General ADL Comments: STS this session; LE coordination rather unpredictable and pt with max numbness in feet.     Vision Baseline Vision/History: 0 No visual deficits;1 Wears glasses Ability to See in Adequate Light: 0 Adequate Patient Visual Report: No change from baseline Vision Assessment?: No apparent visual deficits     Perception     Praxis      Pertinent Vitals/Pain Pain Assessment Pain Assessment: No/denies pain (numbness)     Hand Dominance Right   Extremity/Trunk Assessment Upper Extremity Assessment Upper Extremity Assessment: Generalized weakness;LUE deficits/detail;RUE deficits/detail RUE Deficits / Details: strength 4+/5, but numbness/tingling BUE and decreased dexterity during functional activity. Able to use BUE to don socks, but pt reports min-mod spillage during self feeding. RUE Sensation: decreased light touch;decreased proprioception LUE Deficits / Details: strength 4+/5, but numbness/tingling BUE and decreased dexterity during functional activity. Able to use BUE to don socks, but pt reports min-mod spillage during self feeding. LUE Sensation: decreased light touch;decreased proprioception   Lower Extremity Assessment Lower Extremity Assessment: Defer to PT evaluation RLE Deficits / Details: Rt hip flexion 4-/5, abduction 4/5, knee extension 4/5, dorsiflexion 4/5, hallux extension 4/5. RLE Sensation: decreased light touch;decreased proprioception LLE Deficits / Details: Lt hip flexion 4-/5, abduction 4-/5, knee extension 4/5, dorsiflexion 4-/5, hallux extension 4/5. LLE Sensation: decreased light touch;decreased proprioception       Communication Communication Communication: No  difficulties   Cognition Arousal/Alertness: Awake/alert Behavior During Therapy: Anxious Overall Cognitive Status: Within Functional Limits for tasks assessed                                 General Comments: Able to communicate all areas he is having numbness, recall prevsious events recently leading up to admission.     General Comments  VSS, but pt with poor tempurature regulation and reporting this has been going on since arrival    Exercises     Shoulder Instructions      Home Living Family/patient expects to be discharged to:: Private residence Living Arrangements: Spouse/significant other Available Help at Discharge: Family;Available 24 hours/day Type of Home: House Home Access: Ramped entrance     Home Layout: Able to live on main level with bedroom/bathroom     Bathroom Shower/Tub: Producer, television/film/video: Standard Bathroom Accessibility: Yes   Home Equipment: Grab bars - tub/shower;Shower Counsellor (2 wheels);Wheelchair - manual          Prior Functioning/Environment Prior Level of Function : Independent/Modified Independent             Mobility Comments: Manages farm; provides maintenance ADLs Comments: Indep        OT Problem List: Decreased activity tolerance;Decreased strength;Impaired balance (sitting and/or standing);Decreased coordination;Decreased safety awareness;Decreased knowledge of use of DME or AE;Impaired sensation;Impaired UE functional use      OT Treatment/Interventions: Self-care/ADL training;Therapeutic exercise;DME and/or AE instruction;Therapeutic activities;Patient/family education;Balance training    OT Goals(Current goals can be found in the care plan section) Acute Rehab OT Goals Patient Stated Goal: go to rehab OT Goal Formulation: With patient/family Time For Goal Achievement: 11/11/22 Potential to  Achieve Goals: Good  OT Frequency: Min 2X/week    Co-evaluation               AM-PAC OT "6 Clicks" Daily Activity     Outcome Measure Help from another person eating meals?: A Little Help from another person taking care of personal grooming?: A Little Help from another person toileting, which includes using toliet, bedpan, or urinal?: A Little Help from another person bathing (including washing, rinsing, drying)?: A Little Help from another person to put on and taking off regular upper body clothing?: A Little Help from another person to put on and taking off regular lower body clothing?: A Little 6 Click Score: 18   End of Session Equipment Utilized During Treatment: Gait belt;Rolling walker (2 wheels) Nurse Communication: Mobility status  Activity Tolerance: Patient tolerated treatment well Patient left: in bed;with call bell/phone within reach;with bed alarm set;with family/visitor present  OT Visit Diagnosis: Unsteadiness on feet (R26.81);Repeated falls (R29.6);Muscle weakness (generalized) (M62.81)                Time: [id number] OT Time Calculation (min): 28 min Charges:  OT General Charges $OT Visit: 1 Visit OT Evaluation $OT Eval Moderate Complexity: 1 Mod OT Treatments $Self Care/Home Management : 8-22 mins  Elder Cyphers, OTR/L [facility] Acute Rehabilitation Office: [phone]    Magnus Ivan 10/28/2022, 2:40 PM

## 2022-10-28 NOTE — Evaluation (Signed)
Physical Therapy Evaluation Patient Details Name: Joe Greene MRN: [id number] DOB: [birth year] Today's Date: 10/28/2022  History of Present Illness  Pt is a 71 y.o. M who presents 10/24/2022 with Acute inflammatory demyelinating polyneuropathy. 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 Complains of worsening symptoms today, that he can no longer feel perineal area. Difficulty standing due to LEs buckling. With great effort from patient, required Min assist with bed mobility and transfer. Took a few steps laterally along bed with min assist for balance and sequencing; notable instability of LEs. Pt very anxious. RN notified of findings. Pt currently with functional limitations due to the deficits listed below (see PT Problem List). Pt will benefit from skilled PT to increase their independence and safety with mobility to allow discharge to the venue listed below.          Recommendations for follow up therapy are one component of a multi-disciplinary discharge planning process, led by the attending physician.  Recommendations may be updated based on patient status, additional functional criteria and insurance authorization.  Follow Up Recommendations Acute inpatient rehab (3hours/day)      Assistance Recommended at Discharge Frequent or constant Supervision/Assistance  Patient can return home with the following  Two people to help with walking and/or transfers;A lot of help with bathing/dressing/bathroom;Assistance with cooking/housework;Help with stairs or ramp for entrance;Assist for transportation    Equipment Recommendations None recommended by PT  Recommendations for Other Services  Rehab consult    Functional Status Assessment Patient has had a recent decline in their functional status and demonstrates the ability to make significant improvements in function in a reasonable and predictable amount of time.      Precautions / Restrictions Precautions Precautions: Fall Restrictions Weight Bearing Restrictions: No      Mobility  Bed Mobility Overal bed mobility: Needs Assistance Bed Mobility: Supine to Sit     Supine to sit: Min assist, HOB elevated     General bed mobility comments: Min assist for trunk support to rise to EOB.    Transfers Overall transfer level: Needs assistance Equipment used: Rolling walker (2 wheels) Transfers: Sit to/from Stand Sit to Stand: Min assist           General transfer comment: Min assist for boost to stand from low bed setting, cues for hand placement and to prevent excessive pressure on RW. Once upright shows some spontaneous buckling of LEs but stable with UE support on RW.    Ambulation/Gait Ambulation/Gait assistance: Min assist Gait Distance (Feet): 3 Feet Assistive device: Rolling walker (2 wheels)         General Gait Details: Lateral steps along bed with use of RW for UE support due to LE buckling. Min assist for RW control and balance. Cues throughout.  Stairs            Wheelchair Mobility    Modified Rankin (Stroke Patients Only)       Balance Overall balance assessment: Needs assistance Sitting-balance support: No upper extremity supported, Feet supported Sitting balance-Leahy Scale: Fair     Standing balance support: Bilateral upper extremity supported Standing balance-Leahy Scale: Poor                               Pertinent Vitals/Pain Pain Assessment Pain Assessment: No/denies pain (Just numbness)    Home Living Family/patient expects to be discharged to:: Private residence Living  Arrangements: Spouse/significant other Available Help at Discharge: Family;Available 24 hours/day Type of Home: House Home Access: Ramped entrance       Home Layout: Able to live on main level with bedroom/bathroom Home Equipment: Grab bars - tub/shower;Shower Counsellor (2 wheels);Wheelchair  - manual      Prior Function Prior Level of Function : Independent/Modified Independent             Mobility Comments: Manages farm; provides maintenance       Hand Dominance   Dominant Hand: Right    Extremity/Trunk Assessment   Upper Extremity Assessment Upper Extremity Assessment: Defer to OT evaluation    Lower Extremity Assessment Lower Extremity Assessment: RLE deficits/detail;LLE deficits/detail RLE Deficits / Details: Rt hip flexion 4-/5, abduction 4/5, knee extension 4/5, dorsiflexion 4/5, hallux extension 4/5. RLE Sensation: decreased light touch;decreased proprioception LLE Deficits / Details: Lt hip flexion 4-/5, abduction 4-/5, knee extension 4/5, dorsiflexion 4-/5, hallux extension 4/5. LLE Sensation: decreased light touch;decreased proprioception       Communication   Communication: No difficulties  Cognition Arousal/Alertness: Awake/alert Behavior During Therapy: Anxious Overall Cognitive Status: Within Functional Limits for tasks assessed                                          General Comments      Exercises     Assessment/Plan    PT Assessment Patient needs continued PT services  PT Problem List Decreased strength;Decreased range of motion;Decreased activity tolerance;Decreased balance;Decreased mobility;Decreased coordination;Decreased knowledge of use of DME;Impaired sensation;Impaired tone       PT Treatment Interventions DME instruction;Gait training;Functional mobility training;Therapeutic activities;Therapeutic exercise;Balance training;Neuromuscular re-education;Patient/family education;Wheelchair mobility training    PT Goals (Current goals can be found in the Care Plan section)  Acute Rehab PT Goals Patient Stated Goal: Improve symptoms, return to PLOF PT Goal Formulation: With patient Time For Goal Achievement: 11/11/22 Potential to Achieve Goals: Good    Frequency Min 4X/week     Co-evaluation                AM-PAC PT "6 Clicks" Mobility  Outcome Measure Help needed turning from your back to your side while in a flat bed without using bedrails?: A Little Help needed moving from lying on your back to sitting on the side of a flat bed without using bedrails?: A Little Help needed moving to and from a bed to a chair (including a wheelchair)?: A Little Help needed standing up from a chair using your arms (e.g., wheelchair or bedside chair)?: A Little Help needed to walk in hospital room?: Total Help needed climbing 3-5 steps with a railing? : Total 6 Click Score: 14    End of Session Equipment Utilized During Treatment: Gait belt Activity Tolerance: Patient limited by fatigue Patient left: in bed;Other (comment);with family/visitor present (OT into room to work with Pt.) Nurse Communication: Mobility status;Other (comment) (Pt concern for worsening symptoms.) PT Visit Diagnosis: Unsteadiness on feet (R26.81);Other abnormalities of gait and mobility (R26.89);Difficulty in walking, not elsewhere classified (R26.2);Other symptoms and signs involving the nervous system (R29.898)    Time: 6063-0160 PT Time Calculation (min) (ACUTE ONLY): 15 min   Charges:   PT Evaluation $PT Eval Moderate Complexity: 1 Mod          Kathlyn Sacramento, PT, DPT Physical Therapist Acute Rehabilitation Services [facility] Outpatient Rehabilitation Services  [facility] 10/28/2022, 2:09 PM

## 2022-10-28 NOTE — Progress Notes (Signed)
Progress Note   Patient: Joe Greene [id number] DOB: [birth year] DOA: 10/24/2022     3 DOS: the patient was seen and examined on 10/28/2022   Brief hospital course: Joe Greene was admitted to the hospital with the working diagnosis of acute demyelinating polyneuropathy.   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 At home he had recurrent and worsening ascending paresthesias, urinary retention, difficulty ambulating, not able to stand and persistent neuropathic pain. Patient was evaluated by neurology as outpatient, and had a nerve conduction study which showed acute inflammatory demyelinating neuropathy. Patient was referred to the hospital for further management.   Na 138, K 4.2 Cl 106, bicarbonate 21, glucose 110 bun 20 cr 0,90  Wbc 8,3 hgb 15.7 plt 258  Urine analysis with SG 1,021, pH 5,0, negative protein and negative leukocytes.   Chest radiograph with no cardiomegaly, no infiltrates or effusions.   EKG 77 bpm, normal axis, normal intervals, sinus rhythm with no significant ST segment or T wave changes.   Neurology has been consulted with recommendations for plasmapheresis.  IR consulted for catheter placement.   12/15 Plasma exchange (1st), NIF greater than -40 and VC 2,7 L.  12/16 Plan to continue plasma exchange Monday, Tuesday, Thursday and Friday.  12/17 medically stable, continue to have paresthesias upper and lower extremities. Respiratory not compromised.   Assessment and Plan: * Acute inflammatory demyelinating polyneuropathy (HCC) Patient had his 1st plasma exchange on 12/15, with subjective improvement in his lower extremity weakness.  Will have 4 more therapies next week, Monday, Tuesday, Thursday and Friday.   Today he feels worsening paresthesias and ankle weakness but not dyspnea.  His NIF continue stable at greater than negative 40 and vital capacity 1.4 L  Continue with  gabapentin for neuropathic pain.  PT and OT as tolerated.   Essential hypertension Continue blood pressure control with losartan.  Systolic blood pressure has been 130 to 140 mmHg.   AAA (abdominal aortic aneurysm) (HCC) Follow up as outpatient, seems to be stable.   Acute sensory neuropathy Continue with plasmapheresis per neurology recommendations.  Continue with gabapentin.   Emphysema of lung (HCC) No signs of acute exacerbation, continue oxymetry monitoring.   Iron deficiency Cell count has been stable.         Subjective: Patient with worsening paresthesias upper extremities today with ankle weakness, no dyspnea or chest pain, no nausea or vomiting.   Physical Exam: Vitals:   10/28/22 0139 10/28/22 0500 10/28/22 0537 10/28/22 0852  BP: 122/73  (!) 145/89 (!) 132/90  Pulse: 79  73 85  Resp: 16  18 19   Temp: 97.9 F (36.6 C)  98.5 F (36.9 C) 97.6 F (36.4 C)  TempSrc: Oral  Oral Oral  SpO2: 96%  95% 97%  Weight:  92.7 kg    Height:       Neurology awake and alert. He has decreased strength hand grip, lower extremities proximal strength preserved.  ENT with no pallor Cardiovascular with S1 and S2 present and rhythmic Respiratory with no rales or wheezing Abdomen with no distention  No lower extremity edema  Data Reviewed:    Family Communication: I spoke with patient's wife at the bedside, we talked in detail about patient's condition, plan of care and prognosis and all questions were addressed.   Disposition: Status is: Inpatient Remains inpatient appropriate because: pending completion of plasmapheresis   Planned Discharge Destination:  possible  CIR       Author: Tawni Millers, MD 10/28/2022 4:01 PM  For on call review [email or web address].

## 2022-10-28 NOTE — Progress Notes (Signed)
   10/27/22 2314  What Happened  Was fall witnessed? No  Was patient injured? No  Patient found on floor  Found by Staff-comment Melina Copa)  Stated prior activity ambulating-unassisted  Provider Notification  Provider Name/Title Perrin Maltese, MD  Date Provider Notified 10/27/22  Time Provider Notified 2330  Method of Notification Page  Notification Reason Fall  Provider response No new orders  Date of Provider Response 10/27/22  Time of Provider Response 2335  Follow Up  Family notified No - patient refusal  Adult Fall Risk Assessment  Risk Factor Category (scoring not indicated) High fall risk per protocol (document High fall risk)  Patient Fall Risk Level High fall risk  Adult Fall Risk Interventions  Required Bundle Interventions *See Row Information* High fall risk - low, moderate, and high requirements implemented  Additional Interventions PT/OT need assessed if change in mobility from baseline  Screening for Fall Injury Risk (To be completed on HIGH fall risk patients) - Assessing Need for Floor Mats  Risk For Fall Injury- Criteria for Floor Mats None identified - No additional interventions needed  Vitals  Temp 98 F (36.7 C)  Temp Source Oral  BP (!) 148/81  MAP (mmHg) 99  BP Location Right Arm  BP Method Automatic  Patient Position (if appropriate) Lying  Pulse Rate 91  Pulse Rate Source Monitor  Resp 17  Oxygen Therapy  SpO2 95 %  O2 Device Room Air  Pain Assessment  Pain Scale 0-10  Pain Score 0  PCA/Epidural/Spinal Assessment  Respiratory Pattern Regular  Neurological  Neuro (WDL) X  Level of Consciousness Alert  Orientation Level Oriented X4  Speech Clear  Facial Symmetry Symmetrical  R Hand Grip Strong  L Hand Grip Strong  RLE Sensation Numbness;Decreased  RLE Motor Strength 4  LLE Sensation Numbness;Decreased  LLE Motor Strength 4  Neuro Symptoms None  Glasgow Coma Scale  Eye Opening 4  Best Verbal Response (NON-intubated) 5  Best Motor  Response 6  Glasgow Coma Scale Score 15  Musculoskeletal  Musculoskeletal (WDL) X  Assistive Device BSC  Generalized Weakness Yes  Musculoskeletal Details  RLE Weakness  LLE Weakness  Integumentary  Integumentary (WDL) X  Skin Color Appropriate for ethnicity  Skin Condition Dry  Skin Integrity Abrasion  Abrasion Location Knee  Abrasion Location Orientation Bilateral  Abrasion Intervention Cleansed  Skin Turgor Non-tenting

## 2022-10-28 NOTE — Progress Notes (Signed)
NEUROLOGY CONSULTATION SERVICE  PROGRESS NOTE   Date of service: October 28, 2022 Patient Name: Joe Greene MRN:  [id number] DOB:  1950/11/17  Brief HPI  Joe Greene was admitted to the hospital with the working diagnosis of acute demyelinating polyneuropathy.    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 At home he had recurrent and worsening ascending paresthesias, urinary retention, difficulty ambulating, not able to stand and persistent neuropathic pain. Patient was evaluated by neurology as outpatient, and had a nerve conduction study which showed acute inflammatory demyelinating neuropathy. Patient was referred to the hospital for further management.   12/15 Plasma exchange (1st), NIF greater than -40 and VC 2,7 L.  12/16 Plan to continue plasma exchange Monday, Tuesday, Thursday and Friday.     Interval Hx   12/17 medically stable, continue to have paresthesias upper and lower extremities. Respiratory not compromised. Remaining PLEX treatments scheduled for Mon, Tues, Thurs, Fri. Remaining schedule and full plan of care discussed with patient and wife at bedside.   Vitals   Vitals:   10/28/22 0500 10/28/22 0537 10/28/22 0852 10/28/22 1635  BP:  (!) 145/89 (!) 132/90 129/79  Pulse:  73 85 81  Resp:  18 19 18   Temp:  98.5 F (36.9 C) 97.6 F (36.4 C) 98 F (36.7 C)  TempSrc:  Oral Oral Oral  SpO2:  95% 97% 96%  Weight: 92.7 kg     Height:         Body mass index is 29.32 kg/m.  Physical Exam   General: Laying comfortably in bed; in no acute distress.  HENT: Normal oropharynx and mucosa. Normal external appearance of ears and nose.  Neck: Supple, no pain or tenderness  CV: No JVD. No peripheral edema.  Pulmonary: Symmetric Chest rise. Normal respiratory effort.  Abdomen: Soft to touch, non-tender.  Ext: No cyanosis, edema, or deformity  Skin: No rash. Normal palpation of skin.    Musculoskeletal: Normal digits and nails by inspection. No clubbing.   Neurologic Examination  Mental status/Cognition: Alert, oriented to self, place, month and year, good attention.  Speech/language: Fluent, comprehension intact, object naming intact, repetition intact.  Cranial nerves:  Pupils equal and reacitve. NO VF defecits, glasses at baseline. EOM intact, no nystagmus. Face symmetric. Hearing intact to voce. Shoulder shrug symmetrical. Midline tongue protrusion  Motor:  Normal bulk and tone.  Decreased fine motor movements bilateral hands. 5/5 arm movement. Only able to lift right leg slightly off bed,left leg 5/5. Decreased plantar extension in the right foot.   Sensation: Decreased sensation and parathesia to bilateral hands and fingers and to bilateral legs up to the pelvic area. Reports he can feel it, but "it doesn't feel right. Like when you're at the dentist"  Coordination/Complex Motor:  - Finger to Nose: Intact - Heel to shin: Unable to complete with R leg - Rapid alternating movement: Intact  Gait: deferred  Labs   Basic Metabolic Panel:  Lab Results  Component Value Date   NA 138 10/27/2022   K 4.5 10/27/2022   CO2 19 (L) 10/27/2022   GLUCOSE 128 (H) 10/27/2022   BUN 25 (H) 10/27/2022   CREATININE 1.14 10/27/2022   CALCIUM 9.1 10/27/2022   GFRNONAA >60 10/27/2022   HbA1c:  Lab Results  Component Value Date   HGBA1C 6.5 09/24/2022   LDL:  Lab Results  Component Value Date   LDLCALC UNABLE TO CALCULATE  IF TRIGLYCERIDE OVER 400 mg/dL 32/12/3341   Urine Drug Screen:     Component Value Date/Time   LABOPIA NONE DETECTED 09/24/2022 1722   COCAINSCRNUR NONE DETECTED 09/24/2022 1722   LABBENZ NONE DETECTED 09/24/2022 1722   AMPHETMU NONE DETECTED 09/24/2022 1722   THCU NONE DETECTED 09/24/2022 1722   LABBARB NONE DETECTED 09/24/2022 1722    Alcohol Level No results found for: "ETH" No results found for: "PHENYTOIN", "ZONISAMIDE", "LAMOTRIGINE",  "LEVETIRACETA" No results found for: "PHENYTOIN", "PHENOBARB", "VALPROATE", "CBMZ"  Imaging and Diagnostic studies  Results for orders placed during the hospital encounter of 09/24/22  MRI BRAIN and MR ANGIO HEAD WO CONTRAST   IMPRESSION: 1. No acute brain finding. Mild chronic small-vessel ischemic change of the pons and cerebral hemispheric white matter. 2. Negative intracranial MR angiography of the large and medium size vessels. 3. Left mastoid effusion.    Impression  Joe Greene is a 71 y.o. male with PMH significant for Guillian-Barre syndrome. Recived 1/5 PLEX treatments.  Today, his paresthesia is stil present in hands bilaterally, has progressed up to pelvic area in his bilateral lowers. No incontinence reported.    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 Caryl Pina

## 2022-10-29 ENCOUNTER — Telehealth: Payer: Self-pay | Admitting: Family Medicine

## 2022-10-29 ENCOUNTER — Ambulatory Visit: Payer: Medicare PPO | Admitting: Physical Therapy

## 2022-10-29 DIAGNOSIS — G610 Guillain-Barre syndrome: Secondary | ICD-10-CM | POA: Diagnosis not present

## 2022-10-29 DIAGNOSIS — I10 Essential (primary) hypertension: Secondary | ICD-10-CM | POA: Diagnosis not present

## 2022-10-29 DIAGNOSIS — J439 Emphysema, unspecified: Secondary | ICD-10-CM | POA: Diagnosis not present

## 2022-10-29 DIAGNOSIS — E611 Iron deficiency: Secondary | ICD-10-CM | POA: Diagnosis not present

## 2022-10-29 LAB — CBC
HCT: 42.2 % (ref 39.0–52.0)
Hemoglobin: 15.2 g/dL (ref 13.0–17.0)
MCH: 34.1 pg — ABNORMAL HIGH (ref 26.0–34.0)
MCHC: 36 g/dL (ref 30.0–36.0)
MCV: 94.6 fL (ref 80.0–100.0)
Platelets: 171 10*3/uL (ref 150–400)
RBC: 4.46 MIL/uL (ref 4.22–5.81)
RDW: 13 % (ref 11.5–15.5)
WBC: 8.7 10*3/uL (ref 4.0–10.5)
nRBC: 0 % (ref 0.0–0.2)

## 2022-10-29 LAB — BASIC METABOLIC PANEL
Anion gap: 9 (ref 5–15)
BUN: 22 mg/dL (ref 8–23)
CO2: 23 mmol/L (ref 22–32)
Calcium: 9.2 mg/dL (ref 8.9–10.3)
Chloride: 105 mmol/L (ref 98–111)
Creatinine, Ser: 0.92 mg/dL (ref 0.61–1.24)
GFR, Estimated: >60 mL/min (ref >60)
Glucose, Bld: 127 mg/dL — ABNORMAL HIGH (ref 70–99)
Potassium: 3.6 mmol/L (ref 3.5–5.1)
Sodium: 137 mmol/L (ref 135–145)

## 2022-10-29 MED ORDER — CALCIUM CARBONATE ANTACID 500 MG PO CHEW
2.0000 | CHEWABLE_TABLET | ORAL | Status: DC
  Administered 2022-10-29 (×2): 400 mg via ORAL
  Filled 2022-10-29 (×2): qty 2

## 2022-10-29 MED ORDER — SODIUM CHLORIDE 0.9 % IV SOLN
INTRAVENOUS | Status: AC
  Filled 2022-10-29 (×5): qty 200

## 2022-10-29 MED ORDER — ORAL CARE MOUTH RINSE: 15.0000 mL | OROMUCOSAL | Status: DC | PRN

## 2022-10-29 MED ORDER — ACETAMINOPHEN 325 MG PO TABS: 650.0000 mg | ORAL_TABLET | ORAL | Status: DC | PRN

## 2022-10-29 MED ORDER — HEPARIN SODIUM (PORCINE) 1000 UNIT/ML IJ SOLN
INTRAMUSCULAR | Status: AC
  Administered 2022-10-29: 1000 [IU]
  Filled 2022-10-29: qty 3

## 2022-10-29 MED ORDER — CALCIUM GLUCONATE-NACL 2-0.675 GM/100ML-% IV SOLN
2.0000 g | Freq: Once | INTRAVENOUS | Status: AC
  Administered 2022-10-29: 2000 mg via INTRAVENOUS
  Filled 2022-10-29: qty 100

## 2022-10-29 MED ORDER — DIPHENHYDRAMINE HCL 25 MG PO CAPS
25.0000 mg | ORAL_CAPSULE | Freq: Four times a day (QID) | ORAL | Status: DC | PRN
  Administered 2022-10-29: 25 mg via ORAL
  Filled 2022-10-29: qty 1

## 2022-10-29 MED ORDER — ACD FORMULA A 0.73-2.45-2.2 GM/100ML VI SOLN
1000.0000 mL | Status: DC
  Administered 2022-10-29: 1000 mL

## 2022-10-29 MED ORDER — HEPARIN SODIUM (PORCINE) 1000 UNIT/ML IJ SOLN: 1000.0000 [IU] | Freq: Once | INTRAMUSCULAR | Status: DC

## 2022-10-29 NOTE — Care Management Important Message (Signed)
Important Message  Patient Details  Name: Joe Greene MRN: [id number] Date of Birth: 1951/08/15   Medicare Important Message Given:        Dorena Bodo 10/29/2022, 3:39 PM

## 2022-10-29 NOTE — Progress Notes (Signed)
Physical Therapy Treatment Patient Details Name: Joe Greene MRN: [id number] DOB: 1951/09/28 Today's Date: 10/29/2022   History of Present Illness Pt is a 71 y.o. M who presents 10/24/2022 with Acute inflammatory demyelinating polyneuropathy. Significant [id number] Barre syndrome 09/2022 treated with IV IG    PT Comments    Some improvement noted in gross function today. Able to ambulate into hallway with min assist 46ft x2, LEs spontaneously buckling slightly but does well with positioning UEs over RW for support following training and is able to stabilize himself. Improved independence with bed mobility. Patient will continue to benefit from skilled physical therapy services to further improve independence with functional mobility.    Recommendations for follow up therapy are one component of a multi-disciplinary discharge planning process, led by the attending physician.  Recommendations may be updated based on patient status, additional functional criteria and insurance authorization.  Follow Up Recommendations  Acute inpatient rehab (3hours/day)     Assistance Recommended at Discharge Frequent or constant Supervision/Assistance  Patient can return home with the following Two people to help with walking and/or transfers;A lot of help with bathing/dressing/bathroom;Assistance with cooking/housework;Help with stairs or ramp for entrance;Assist for transportation   Equipment Recommendations  None recommended by PT    Recommendations for Other Services Rehab consult     Precautions / Restrictions Precautions Precautions: Fall Restrictions Weight Bearing Restrictions: No     Mobility  Bed Mobility Overal bed mobility: Needs Assistance Bed Mobility: Supine to Sit, Sit to Supine     Supine to sit: HOB elevated, Min guard Sit to supine: Min guard, HOB elevated   General bed mobility comments: Min guard for safety today, no physical assist required, extra time, uses momentum to  rock into seated position from supine.    Transfers Overall transfer level: Needs assistance Equipment used: Rolling walker (2 wheels) Transfers: Sit to/from Stand Sit to Stand: Min assist           General transfer comment: Min assist for boost and balance. Cues to check foot placement for wide enough BOS. Performed x3 from bed.    Ambulation/Gait Ambulation/Gait assistance: Min assist Gait Distance (Feet): 50 Feet (x2) Assistive device: Rolling walker (2 wheels) Gait Pattern/deviations: Step-through pattern, Decreased stride length, Knees buckling, Wide base of support Gait velocity: decreased Gait velocity interpretation: <1.31 ft/sec, indicative of household ambulator Pre-gait activities: Tolerated several pre-gait activities focusing on weight shift, march, and balance at EOB prior to walking. General Gait Details: Tolerated lateral steps along bed Lt and Rt. Progressed with forward gait and turns, navigating 50 feet x2 with seated rest break. Min assist for balance, LEs buckling slightly but demonstrates good UE strength to support himself. Cues for sequencing and walker placement with turns.   Stairs             Wheelchair Mobility    Modified Rankin (Stroke Patients Only)       Balance Overall balance assessment: Needs assistance Sitting-balance support: No upper extremity supported, Feet supported Sitting balance-Leahy Scale: Fair     Standing balance support: Bilateral upper extremity supported Standing balance-Leahy Scale: Poor                              Cognition Arousal/Alertness: Awake/alert Behavior During Therapy: WFL for tasks assessed/performed Overall Cognitive Status: Within Functional Limits for tasks assessed  Exercises      General Comments General comments (skin integrity, edema, etc.): Declines sitting in chair. Complains of RUQ pain, abdomen somewhat  distended. TTP RUQ. RN notified.      Pertinent Vitals/Pain Pain Assessment Pain Assessment: No/denies pain    Home Living                          Prior Function            PT Goals (current goals can now be found in the care plan section) Acute Rehab PT Goals Patient Stated Goal: Improve symptoms, return to PLOF PT Goal Formulation: With patient Time For Goal Achievement: 11/11/22 Potential to Achieve Goals: Good Progress towards PT goals: Progressing toward goals    Frequency    Min 4X/week      PT Plan Current plan remains appropriate    Co-evaluation              AM-PAC PT "6 Clicks" Mobility   Outcome Measure  Help needed turning from your back to your side while in a flat bed without using bedrails?: A Little Help needed moving from lying on your back to sitting on the side of a flat bed without using bedrails?: A Little Help needed moving to and from a bed to a chair (including a wheelchair)?: A Little Help needed standing up from a chair using your arms (e.g., wheelchair or bedside chair)?: A Little Help needed to walk in hospital room?: A Little Help needed climbing 3-5 steps with a railing? : Total 6 Click Score: 16    End of Session Equipment Utilized During Treatment: Gait belt Activity Tolerance: Patient tolerated treatment well Patient left: in bed;with call bell/phone within reach;with bed alarm set Nurse Communication: Mobility status (RUQ pain) PT Visit Diagnosis: Unsteadiness on feet (R26.81);Other abnormalities of gait and mobility (R26.89);Difficulty in walking, not elsewhere classified (R26.2);Other symptoms and signs involving the nervous system (R29.898)     Time: 0630-1601 PT Time Calculation (min) (ACUTE ONLY): 20 min  Charges:  $Gait Training: 8-22 mins                     Kathlyn Sacramento, PT, DPT Physical Therapist Acute Rehabilitation Services [facility] Outpatient Rehabilitation  Services [facility] 10/29/2022, 12:01 PM

## 2022-10-29 NOTE — Care Management Important Message (Signed)
Important Message  Patient Details  Name: Joe Greene MRN: [id number] Date of Birth: May 29, 1951   Medicare Important Message Given:  Yes     Angeles Zehner Stefan Church 10/29/2022, 3:39 PM

## 2022-10-29 NOTE — Progress Notes (Signed)
RT NOTE:  NIF: -40 VC: 1.2L Good patient effort x 3

## 2022-10-29 NOTE — Consult Note (Signed)
   [facility] CM Inpatient Consult   10/29/2022  Joe Greene October 04, 1951 [id number]  [facility] [[facility]]  Accountable Care Organization [ACO] Patient:    Primary Care Provider:  Loyola Mast, MD, [place] [facility]   Patient screened for less than 30 days readmission hospitalization with noted low risk score for unplanned readmission risk to assess for potential [facility]  [[facility]] Care Management service needs for post hospital transition for care coordination.  Review of patient's electronic medical record reveals patient is being recommended for an inpatient rehabilitation level of care  Came by room on rounds and patient is currently off the unit.  Plan:  Continue to follow progress and disposition to assess for post hospital community care coordination/management needs.  Referral request for community care coordination: no needs noted currently.  Of note, [facility] Care Management/Population Health does not replace or interfere with any arrangements made by the Inpatient Transition of Care team.  For questions contact:   Charlesetta Shanks, RN BSN CCM [facility]  [phone] business mobile phone Toll free office [phone]  *Concierge Line  [phone] Fax number: [phone] [email or web address]

## 2022-10-29 NOTE — Progress Notes (Signed)
   PROGRESS NOTE    Joe Greene  JJO:[id number] DOB: [birth year] DOA: 10/24/2022 PCP: Loyola Mast, MD   Brief Narrative: Joe Greene is a 71 y.o. male with a history of Guillain-Barre syndrome. Patient presented secondary to worsening weakness and admitted for acute inflammatory demyelinating polyneuropathy. Neurology consulted and started plasma exchange therapy.   Assessment and Plan: * Acute inflammatory demyelinating polyneuropathy (HCC) Patient has a recent history of IVIG treatment for Guillain-Barre syndrome but presented with worsening paresthesias and weakness. Neurology consulted and have recommended plasma exchange; first session on 12/15. -Neurology recommendations: continued plasma exchange (12/18, 12/19, 12/20, 12/21) -Continue NIF q12 hours  Essential hypertension -Continue losartan  AAA (abdominal aortic aneurysm) (HCC) Stable. Outpatient follow-up.  Emphysema of lung (HCC) Stable.  Iron deficiency Patient is managed on iron supplementation as an outpatient. No associated anemia.   DVT prophylaxis: Lovenox Code Status:   Code Status: Full Code Family Communication: None at bedside Disposition Plan: Discharge possibly to acute inpatient rehabilitation after completion of plasma exchange and neurology recommendations.   Consultants:  Neurology  Procedures:  None  Antimicrobials: None    Subjective: Patient reports some mild improvement of paresthesias in bilateral hands. No other issues noted.  Objective: BP (!) 124/91 (BP Location: Right Arm)   Pulse 78   Temp (!) 97.5 F (36.4 C) (Oral)   Resp 19   Ht 5\' 10"  (1.778 m)   Wt 92.7 kg   SpO2 94%   BMI 29.32 kg/m   Examination:  General exam: Appears calm and comfortable Respiratory system: Clear to auscultation. Respiratory effort normal. Cardiovascular system: S1 & S2 heard, RRR. Gastrointestinal system: Abdomen is nondistended, soft and nontender. No organomegaly or masses felt. Normal  bowel sounds heard. Central nervous system: Alert and oriented. Musculoskeletal: No edema. No calf tenderness Skin: No cyanosis. No rashes Psychiatry: Judgement and insight appear normal. Mood & affect appropriate.    Data Reviewed: I have personally reviewed following labs and imaging studies  CBC Lab Results  Component Value Date   WBC 8.7 10/29/2022   RBC 4.46 10/29/2022   HGB 15.2 10/29/2022   HCT 42.2 10/29/2022   MCV 94.6 10/29/2022   MCH 34.1 (H) 10/29/2022   PLT 171 10/29/2022   MCHC 36.0 10/29/2022   RDW 13.0 10/29/2022   LYMPHSABS 2.5 10/24/2022   MONOABS 0.7 10/24/2022   EOSABS 0.6 (H) 10/24/2022   BASOSABS 0.1 10/24/2022     Last metabolic panel Lab Results  Component Value Date   NA 137 10/29/2022   K 3.6 10/29/2022   CL 105 10/29/2022   CO2 23 10/29/2022   BUN 22 10/29/2022   CREATININE 0.92 10/29/2022   GLUCOSE 127 (H) 10/29/2022   GFRNONAA >60 10/29/2022   CALCIUM 9.2 10/29/2022   PROT 7.8 10/24/2022   ALBUMIN 4.0 10/24/2022   BILITOT 0.3 10/24/2022   ALKPHOS 42 10/24/2022   AST 24 10/24/2022   ALT 37 10/24/2022   ANIONGAP 9 10/29/2022    GFR: Estimated Creatinine Clearance: 84.3 mL/min (by C-G formula based on SCr of 0.92 mg/dL).  No results found for this or any previous visit (from the past 240 hour(s)).    Radiology Studies: No results found.    LOS: 4 days    10/31/2022, MD [facility] 10/29/2022, 4:31 PM   If 7PM-7AM, please contact night-coverage [email or web address]

## 2022-10-29 NOTE — Progress Notes (Addendum)
NEUROLOGY CONSULTATION SERVICE  PROGRESS NOTE   Date of service: October 29, 2022 Patient Name: Joe Greene MRN:  [id number] DOB:  11-01-1951  Brief HPI  Joe Greene is a 71yr old man was admitted to the hospital with the working diagnosis of acute demyelinating polyneuropathy. 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 He had recurrent and worsening ascending paresthesias, urinary retention, difficulty ambulating, not able to stand and persistent neuropathic pain. Patient was evaluated by neurology as outpatient, and had a nerve conduction study which showed acute inflammatory demyelinating neuropathy. Patient was referred to the hospital for further management. PLEX was started 12/15.    Interval Hx   12/18 medically stable, continue to have paresthesias upper and lower extremities. Respiratory not compromised. Today will be PLEX #2. He reports he had severe nausea at end of first PLEX, recommend RN gives PRN Zofran prior to going for next PLEX today.   Vitals   Vitals:   10/28/22 1635 10/28/22 2134 10/29/22 0603 10/29/22 0833  BP: 129/79 115/70 118/67 133/74  Pulse: 81 81 71 71  Resp: 18 18 18 17   Temp: 98 F (36.7 C) 98.5 F (36.9 C) 98 F (36.7 C) 97.9 F (36.6 C)  TempSrc: Oral Oral Oral Oral  SpO2: 96% 94% 94% 95%  Weight:      Height:         Body mass index is 29.32 kg/m.  Physical Exam   General: Laying comfortably in bed; in no acute distress.  HENT: Normal oropharynx and mucosa. Normal external appearance of ears and nose.  Neck: Supple, no pain or tenderness  CV: No JVD. No peripheral edema.  Pulmonary: Symmetric Chest rise. Normal respiratory effort.  Abdomen: Soft to touch, non-tender.  Ext: No cyanosis, edema, or deformity  Skin: No rash. Normal palpation of skin.   Musculoskeletal: Normal digits and nails by inspection. No clubbing.   Neurologic  Examination  Mental status/Cognition: Alert, oriented to self, place, month and year, good attention.  Speech/language: Fluent, comprehension intact, object naming intact, repetition intact.  Cranial nerves:  Pupils equal and reacitve. NO VF defecits, glasses at baseline. EOM intact, no nystagmus. Face symmetric. Hearing intact to voce. Shoulder shrug symmetrical. Midline tongue protrusion  Motor:  Normal bulk and tone.  Decreased fine motor movements bilateral hands. 5/5 arm movement. Only able to lift right leg slightly off bed,left leg 5/5. Decreased plantar extension in the right foot.   Sensation: Decreased sensation and parathesia to bilateral hands and fingers and to bilateral legs up to the pelvic area. Reports he can feel it, but "it doesn't feel right. Like when you're at the dentist"  Coordination/Complex Motor:  - Finger to Nose: Intact - Heel to shin: Unable to complete with R leg - Rapid alternating movement: Intact  Gait: deferred  Labs   Basic Metabolic Panel:  Lab Results  Component Value Date   NA 137 10/29/2022   K 3.6 10/29/2022   CO2 23 10/29/2022   GLUCOSE 127 (H) 10/29/2022   BUN 22 10/29/2022   CREATININE 0.92 10/29/2022   CALCIUM 9.2 10/29/2022   GFRNONAA >60 10/29/2022   HbA1c:  Lab Results  Component Value Date   HGBA1C 6.5 09/24/2022   LDL:  Lab Results  Component Value Date   LDLCALC UNABLE TO CALCULATE IF TRIGLYCERIDE OVER 400 mg/dL 09/25/2022   Urine Drug Screen:     Component Value  Date/Time   LABOPIA NONE DETECTED 09/24/2022 1722   COCAINSCRNUR NONE DETECTED 09/24/2022 1722   LABBENZ NONE DETECTED 09/24/2022 1722   AMPHETMU NONE DETECTED 09/24/2022 1722   THCU NONE DETECTED 09/24/2022 1722   LABBARB NONE DETECTED 09/24/2022 1722    Alcohol Level No results found for: "ETH" No results found for: "PHENYTOIN", "ZONISAMIDE", "LAMOTRIGINE", "LEVETIRACETA" No results found for: "PHENYTOIN", "PHENOBARB", "VALPROATE",  "CBMZ"  Imaging and Diagnostic studies  Results for orders placed during the hospital encounter of 09/24/22  MRI BRAIN and MR ANGIO HEAD WO CONTRAST   IMPRESSION: 1. No acute brain finding. Mild chronic small-vessel ischemic change of the pons and cerebral hemispheric white matter. 2. Negative intracranial MR angiography of the large and medium size vessels. 3. Left mastoid effusion.    Impression  Joe Greene is a 71 y.o. male with PMH significant for Guillian-Barre syndrome in Nov with near resolution after course of IVIg. Readmitted with further paresthesia in hands bilaterally, has progressed up to pelvic area in his bilateral lowers. No incontinence reported.    # Acute inflammatory demyelinating polyneuropathy 12/15 Plasma exchange (1st), NIF greater than -40 and VC 2,7 L.  12/18: NIF: > -40, VC: 1.3L today Plan to continue plasma exchange Monday, Tuesday, Thursday and Friday.  Gabapentin for pain PRN Zofran for nausea pre PLEX today   # Hypertension  Mgmt per primary team  Recommendations  Continue PLEX treatments as planned above Continue gabapentin for neuropathic pain PT/OT evals and tx Neurology will follow in intervals, checking back later this week. Call sooner if needed ______________________________________________________________________  Shon Baton, PhDc, ARNP-C, ANVP-BC Please contact via AMION  NEUROHOSPITALIST ADDENDUM Performed a face to face diagnostic evaluation.   I have reviewed the contents of history and physical exam as documented by PA/ARNP/Resident and agree with above documentation.  I have discussed and formulated the above plan as documented. Edits to the note have been made as needed.  Erick Blinks, MD [facility] [phone]   If 7pm to 7am, please call on call as listed on AMION.

## 2022-10-29 NOTE — Telephone Encounter (Signed)
Caller Name: Adelene Amas Call back phone #: [phone] Fax #[phone]  Reason for Call: Please call number listed above, Burna Mortimer is saying there is an outstanding order from [place] for [facility] that was faxed over on 11/21 and 12/01. I have verified that the fax # they have for our office is correct. Asking for an update

## 2022-10-29 NOTE — Progress Notes (Signed)
Patient arrived back to floor from plasma exchange in hemodialysis unit. VSS, no c/o pain. Stood at bedside to use urinal. Wife at bedside. Call bell in reach.

## 2022-10-30 ENCOUNTER — Institutional Professional Consult (permissible substitution): Payer: Medicare PPO | Admitting: Pulmonary Disease

## 2022-10-30 DIAGNOSIS — M47816 Spondylosis without myelopathy or radiculopathy, lumbar region: Secondary | ICD-10-CM

## 2022-10-30 DIAGNOSIS — J439 Emphysema, unspecified: Secondary | ICD-10-CM

## 2022-10-30 DIAGNOSIS — Z9181 History of falling: Secondary | ICD-10-CM

## 2022-10-30 DIAGNOSIS — E781 Pure hyperglyceridemia: Secondary | ICD-10-CM

## 2022-10-30 DIAGNOSIS — E611 Iron deficiency: Secondary | ICD-10-CM | POA: Diagnosis not present

## 2022-10-30 DIAGNOSIS — I10 Essential (primary) hypertension: Secondary | ICD-10-CM

## 2022-10-30 DIAGNOSIS — I700 Atherosclerosis of aorta: Secondary | ICD-10-CM

## 2022-10-30 DIAGNOSIS — M47812 Spondylosis without myelopathy or radiculopathy, cervical region: Secondary | ICD-10-CM

## 2022-10-30 DIAGNOSIS — G610 Guillain-Barre syndrome: Secondary | ICD-10-CM

## 2022-10-30 DIAGNOSIS — F17210 Nicotine dependence, cigarettes, uncomplicated: Secondary | ICD-10-CM

## 2022-10-30 DIAGNOSIS — K4020 Bilateral inguinal hernia, without obstruction or gangrene, not specified as recurrent: Secondary | ICD-10-CM

## 2022-10-30 DIAGNOSIS — I7143 Infrarenal abdominal aortic aneurysm, without rupture: Secondary | ICD-10-CM

## 2022-10-30 DIAGNOSIS — R7303 Prediabetes: Secondary | ICD-10-CM

## 2022-10-30 DIAGNOSIS — K5730 Diverticulosis of large intestine without perforation or abscess without bleeding: Secondary | ICD-10-CM

## 2022-10-30 LAB — POCT I-STAT, CHEM 8
BUN: 19 mg/dL (ref 8–23)
Calcium, Ion: 1.27 mmol/L (ref 1.15–1.40)
Chloride: 102 mmol/L (ref 98–111)
Creatinine, Ser: 0.9 mg/dL (ref 0.61–1.24)
Glucose, Bld: 106 mg/dL — ABNORMAL HIGH (ref 70–99)
HCT: 46 % (ref 39.0–52.0)
Hemoglobin: 15.6 g/dL (ref 13.0–17.0)
Potassium: 4 mmol/L (ref 3.5–5.1)
Sodium: 138 mmol/L (ref 135–145)
TCO2: 24 mmol/L (ref 22–32)

## 2022-10-30 LAB — CBC
HCT: 46.9 % (ref 39.0–52.0)
Hemoglobin: 16 g/dL (ref 13.0–17.0)
MCH: 32.8 pg (ref 26.0–34.0)
MCHC: 34.1 g/dL (ref 30.0–36.0)
MCV: 96.1 fL (ref 80.0–100.0)
Platelets: 202 10*3/uL (ref 150–400)
RBC: 4.88 MIL/uL (ref 4.22–5.81)
RDW: 13 % (ref 11.5–15.5)
WBC: 10 10*3/uL (ref 4.0–10.5)
nRBC: 0 % (ref 0.0–0.2)

## 2022-10-30 MED ORDER — SODIUM CHLORIDE 0.9 % IV SOLN
INTRAVENOUS | Status: AC
  Filled 2022-10-30 (×3): qty 200

## 2022-10-30 MED ORDER — DIPHENHYDRAMINE HCL 25 MG PO CAPS
25.0000 mg | ORAL_CAPSULE | Freq: Four times a day (QID) | ORAL | Status: DC | PRN
  Administered 2022-10-30 – 2022-11-01 (×4): 25 mg via ORAL
  Filled 2022-10-30 (×4): qty 1

## 2022-10-30 MED ORDER — CALCIUM GLUCONATE-NACL 2-0.675 GM/100ML-% IV SOLN
2.0000 g | Freq: Once | INTRAVENOUS | Status: AC
  Administered 2022-10-30: 2000 mg via INTRAVENOUS
  Filled 2022-10-30: qty 100

## 2022-10-30 MED ORDER — ACETAMINOPHEN 325 MG PO TABS: 650.0000 mg | ORAL_TABLET | ORAL | Status: DC | PRN

## 2022-10-30 MED ORDER — ACD FORMULA A 0.73-2.45-2.2 GM/100ML VI SOLN
1000.0000 mL | Status: DC
  Administered 2022-10-30: 1000 mL
  Filled 2022-10-30 (×3): qty 1000

## 2022-10-30 MED ORDER — HEPARIN SODIUM (PORCINE) 1000 UNIT/ML IJ SOLN
1000.0000 [IU] | Freq: Once | INTRAMUSCULAR | Status: AC
  Administered 2022-10-30: 1000 [IU]
  Filled 2022-10-30: qty 1

## 2022-10-30 NOTE — Care Management Important Message (Signed)
Important Message  Patient Details  Name: Joe Greene MRN: [id number] Date of Birth: 09/12/1951   Medicare Important Message Given:  Yes     Claretha Townshend 10/30/2022, 11:55 AM

## 2022-10-30 NOTE — Plan of Care (Signed)
  Problem: Education: Goal: Knowledge of General Education information will improve Description: Including pain rating scale, medication(s)/side effects and non-pharmacologic comfort measures Outcome: Completed/Met

## 2022-10-30 NOTE — Progress Notes (Signed)
Occupational Therapy Treatment Patient Details Name: Joe Greene MRN: [id number] DOB: 1951-02-23 Today's Date: 10/30/2022   History of present illness Pt is a 71 y.o. M who presents 10/24/2022 with Acute inflammatory demyelinating polyneuropathy. 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 Pt continues to present with decreased activity tolerance, impaired balance, generalized deconditioning and impaired BUE FMC . Session focus on increasing overall activity tolerance for ADL participation and BADL reeducation. Pt currently requires min guard assist for standing grooming tasks at sink with pt leaning into sink, set- up for UB ADLS and MIN guard for LB ADLs via figure four. Pt completed functional ambulation greater than a household distance with Rw and MIN A +2 for chair follow. Pt relies on BUE support from RW during ambulation however no buckling noted. Pt would continue to benefit from skilled occupational therapy while admitted and after d/c to address the below listed limitations in order to improve overall functional mobility and facilitate independence with BADL participation. DC plan remains appropriate, will follow acutely per POC.      Recommendations for follow up therapy are one component of a multi-disciplinary discharge planning process, led by the attending physician.  Recommendations may be updated based on patient status, additional functional criteria and insurance authorization.    Follow Up Recommendations  Acute inpatient rehab (3hours/day)     Assistance Recommended at Discharge Intermittent Supervision/Assistance  Patient can return home with the following  Two people to help with walking and/or transfers;A little help with bathing/dressing/bathroom;Assistance with cooking/housework;Assist for transportation;Help with stairs or ramp for entrance   Equipment Recommendations   Other (comment) (defer to next venue of care)    Recommendations for Other Services      Precautions / Restrictions Precautions Precautions: Fall Restrictions Weight Bearing Restrictions: No       Mobility Bed Mobility Overal bed mobility: Needs Assistance Bed Mobility: Supine to Sit     Supine to sit: HOB elevated, Min guard     General bed mobility comments: + time and effort with use of bed features    Transfers Overall transfer level: Needs assistance Equipment used: Rolling walker (2 wheels) Transfers: Sit to/from Stand Sit to Stand: Min assist, Min guard           General transfer comment: MIN A to rise initially from EOB, pt prefers to pull on RW but progresses to min guard as session continues     Balance Overall balance assessment: Needs assistance Sitting-balance support: No upper extremity supported, Feet supported Sitting balance-Leahy Scale: Fair     Standing balance support: Bilateral upper extremity supported, During functional activity Standing balance-Leahy Scale: Poor Standing balance comment: heavy lean into sink with standing grooming tasks                           ADL either performed or assessed with clinical judgement   ADL Overall ADL's : Needs assistance/impaired   Eating/Feeding Details (indicate cue type and reason): pt reports + time needed for self feeding but able to complete task, offered suggestion of built up handle however pt reprots " I'm not that bad yet" Grooming: Wash/dry hands;Standing;Min guard Grooming Details (indicate cue type and reason): heavy leaning on sink     Lower Body Bathing: Min guard;Sitting/lateral leans Lower Body Bathing Details (indicate cue type and reason): simulated, able to figure 4 Upper Body Dressing :  Set up;Sitting Upper Body Dressing Details (indicate cue type and reason): to don back side gown Lower Body Dressing: Min guard;Sitting/lateral leans Lower Body Dressing Details  (indicate cue type and reason): to adjsut socks via figure 4 Toilet Transfer: Minimal assistance;Rolling walker (2 wheels);Ambulation Toilet Transfer Details (indicate cue type and reason): simulated via functional mobility with Rw, pt compensates with BUE support on Rw however aware of deficit         Functional mobility during ADLs: Minimal assistance;Rolling walker (2 wheels);+2 for safety/equipment (+2 chair follow for longer distances) General ADL Comments: ADl participation impacted by decreased activity tolerance, impaired balance. decreased BUE [place] and generalized deconditioning    Extremity/Trunk Assessment Upper Extremity Assessment Upper Extremity Assessment: Generalized weakness;RUE deficits/detail;LUE deficits/detail RUE Deficits / Details: reports numbness and decreased dexterity, also reprots able to complete ADLs with + time RUE Sensation: decreased light touch;decreased proprioception RUE Coordination: decreased fine motor;decreased gross motor LUE Deficits / Details: reports numbness and decreased dexterity, also reprots able to complete ADLs with + time LUE Sensation: decreased light touch;decreased proprioception LUE Coordination: decreased fine motor;decreased gross motor   Lower Extremity Assessment Lower Extremity Assessment: Defer to PT evaluation   Cervical / Trunk Assessment Cervical / Trunk Assessment: Normal    Vision Baseline Vision/History: 0 No visual deficits;1 Wears glasses Ability to See in Adequate Light: 0 Adequate Vision Assessment?: No apparent visual deficits   Perception Perception Perception: Within Functional Limits   Praxis Praxis Praxis: Intact    Cognition Arousal/Alertness: Awake/alert Behavior During Therapy: WFL for tasks assessed/performed Overall Cognitive Status: Within Functional Limits for tasks assessed                                          Exercises      Shoulder Instructions       General  Comments wife present during session assisting as needed, VSS HR 86 bpm after functional ambulation    Pertinent Vitals/ Pain       Pain Assessment Pain Assessment: Faces Faces Pain Scale: Hurts a little bit Pain Location: BLE Pain Descriptors / Indicators: Discomfort, Grimacing, Sore Pain Intervention(s): Monitored during session  Home Living                                          Prior Functioning/Environment              Frequency  Min 2X/week        Progress Toward Goals  OT Goals(current goals can now be found in the care plan section)  Progress towards OT goals: Progressing toward goals  Acute Rehab OT Goals Patient Stated Goal: get back to tinkering OT Goal Formulation: With patient/family Time For Goal Achievement: 11/11/22 Potential to Achieve Goals: Good  Plan Discharge plan remains appropriate;Frequency remains appropriate    Co-evaluation                 AM-PAC OT "6 Clicks" Daily Activity     Outcome Measure   Help from another person eating meals?: A Little Help from another person taking care of personal grooming?: A Little Help from another person toileting, which includes using toliet, bedpan, or urinal?: A Little Help from another person bathing (including washing, rinsing, drying)?: A Little Help from another person to put on  and taking off regular upper body clothing?: A Little Help from another person to put on and taking off regular lower body clothing?: A Little 6 Click Score: 18    End of Session Equipment Utilized During Treatment: Gait belt;Rolling walker (2 wheels)  OT Visit Diagnosis: Unsteadiness on feet (R26.81);Repeated falls (R29.6);Muscle weakness (generalized) (M62.81)   Activity Tolerance Patient tolerated treatment well   Patient Left in chair;with call bell/phone within reach;with family/visitor present   Nurse Communication Mobility status        Time: 7564-3329 OT Time Calculation  (min): 25 min  Charges: OT General Charges $OT Visit: 1 Visit OT Treatments $Self Care/Home Management : 23-37 mins  Lenor Derrick., COTA/L Acute Rehabilitation Services [phone]   Barron Schmid 10/30/2022, 9:26 AM

## 2022-10-30 NOTE — Telephone Encounter (Signed)
Left message for a rtn call. Haven't received any forms to sign. Will need to re-fax them to [place]. Dm/cma

## 2022-10-30 NOTE — Telephone Encounter (Signed)
Form received and placed on providers desk to be signed. Dm/cma

## 2022-10-30 NOTE — Progress Notes (Signed)
   PROGRESS NOTE    Joe Greene  [id number] DOB: August 01, 1951 DOA: 10/24/2022 PCP: Loyola Mast, MD   Brief Narrative: Joe Greene is a 71 y.o. male with a history of Guillain-Barre syndrome. Patient presented secondary to worsening weakness and admitted for acute inflammatory demyelinating polyneuropathy. Neurology consulted and started plasma exchange therapy.   Assessment and Plan: * Acute inflammatory demyelinating polyneuropathy (HCC) Patient has a recent history of IVIG treatment for Guillain-Barre syndrome but presented with worsening paresthesias and weakness. Neurology consulted and have recommended plasma exchange x5; first session on 12/15. -Neurology recommendations: continued plasma exchange (12/19, 12/20, 12/21) -Continue NIF q12 hours  Essential hypertension -Continue losartan  AAA (abdominal aortic aneurysm) (HCC) Stable. Outpatient follow-up.  Emphysema of lung (HCC) Stable.  Iron deficiency Patient is managed on iron supplementation as an outpatient. No associated anemia.   DVT prophylaxis: Lovenox Code Status:   Code Status: Full Code Family Communication: None at bedside Disposition Plan: Discharge possibly to acute inpatient rehabilitation after completion of plasma exchange and neurology recommendations.   Consultants:  Neurology  Procedures:  None  Antimicrobials: None    Subjective: Continues to have improvement in sensation and function. Ambulated much further today with physical therapy.  Objective: BP 120/74 (BP Location: Left Arm)   Pulse 72   Temp 98.1 F (36.7 C) (Oral)   Resp 18   Ht 5\' 10"  (1.778 m)   Wt 91.9 kg   SpO2 97%   BMI 29.07 kg/m   Examination:  General exam: Appears calm and comfortable Respiratory system: Clear to auscultation. Respiratory effort normal. Cardiovascular system: S1 & S2 heard, RRR. No murmurs, rubs, gallops or clicks. Gastrointestinal system: Abdomen is nondistended, soft and nontender.  Normal bowel sounds heard. Central nervous system: Alert and oriented. Musculoskeletal: No edema. No calf tenderness   Data Reviewed: I have personally reviewed following labs and imaging studies  CBC Lab Results  Component Value Date   WBC 10.0 10/30/2022   RBC 4.88 10/30/2022   HGB 16.0 10/30/2022   HCT 46.9 10/30/2022   MCV 96.1 10/30/2022   MCH 32.8 10/30/2022   PLT 202 10/30/2022   MCHC 34.1 10/30/2022   RDW 13.0 10/30/2022   LYMPHSABS 2.5 10/24/2022   MONOABS 0.7 10/24/2022   EOSABS 0.6 (H) 10/24/2022   BASOSABS 0.1 10/24/2022     Last metabolic panel Lab Results  Component Value Date   NA 137 10/29/2022   K 3.6 10/29/2022   CL 105 10/29/2022   CO2 23 10/29/2022   BUN 22 10/29/2022   CREATININE 0.92 10/29/2022   GLUCOSE 127 (H) 10/29/2022   GFRNONAA >60 10/29/2022   CALCIUM 9.2 10/29/2022   PROT 7.8 10/24/2022   ALBUMIN 4.0 10/24/2022   BILITOT 0.3 10/24/2022   ALKPHOS 42 10/24/2022   AST 24 10/24/2022   ALT 37 10/24/2022   ANIONGAP 9 10/29/2022    GFR: Estimated Creatinine Clearance: 84 mL/min (by C-G formula based on SCr of 0.92 mg/dL).  No results found for this or any previous visit (from the past 240 hour(s)).    Radiology Studies: No results found.    LOS: 5 days    10/31/2022, MD [facility] 10/30/2022, 2:18 PM   If 7PM-7AM, please contact night-coverage [email or web address]

## 2022-10-30 NOTE — Progress Notes (Signed)
NIF -36  VC 1.4 L   Patient had great effort.

## 2022-10-30 NOTE — TOC Initial Note (Addendum)
Transition of Care [facility]) - Initial/Assessment Note    Patient Details  Name: Joe Greene MRN: [id number] Date of Birth: 01-05-1951  [facility] CM/SW Contact:    Tom-Johnson, Hershal Coria, RN Phone Number: 10/30/2022, 4:49 PM  Clinical Narrative:                  CM spoke with patient and spouse wt bedside about needs for discharge disposition. Admitted for Acute Inflammatory Demyelinating Polyneuropathy. Patient has Hx of Guillain-Barre Syndrome. Neurology following, started on Plasma exchange for 5 days till 11/01/22. CIR recommended after completion of plasma exchange.  CM will continue to follow as patient progresses with care towards discharge.     Expected Discharge Plan: IP Rehab Facility Barriers to Discharge: Continued Medical Work up   Patient Goals and CMS Choice Patient states their goals for this hospitalization and ongoing recovery are:: To return home CMS Medicare.gov Compare Post Acute Care list provided to:: Patient Choice offered to / list presented to : Patient, Spouse  Expected Discharge Plan and Services Expected Discharge Plan: IP Rehab Facility   Discharge Planning Services: CM Consult Post Acute Care Choice: IP Rehab Living arrangements for the past 2 months: Single Family Home                           HH Arranged: NA HH Agency: NA        Prior Living Arrangements/Services Living arrangements for the past 2 months: Single Family Home Lives with:: Spouse Patient language and need for interpreter reviewed:: Yes Do you feel safe going back to the place where you live?: Yes      Need for Family Participation in Patient Care: Yes (Comment) Care giver support system in place?: Yes (comment) Current home services: DME (Walker, w/c, bsc, ramp, grab bars.) Criminal Activity/Legal Involvement Pertinent to Current Situation/Hospitalization: No - Comment as needed  Activities of Daily Living Home Assistive Devices/Equipment: Walker  (specify type) (front roller) ADL Screening (condition at time of admission) Patient's cognitive ability adequate to safely complete daily activities?: Yes Is the patient deaf or have difficulty hearing?: No Does the patient have difficulty seeing, even when wearing glasses/contacts?: No Does the patient have difficulty concentrating, remembering, or making decisions?: No Patient able to express need for assistance with ADLs?: Yes Does the patient have difficulty dressing or bathing?: No Independently performs ADLs?: Yes (appropriate for developmental age) Does the patient have difficulty walking or climbing stairs?: Yes Weakness of Legs: Both Weakness of Arms/Hands: None  Permission Sought/Granted Permission sought to share information with : Case Manager, Magazine features editor, Family Supports Permission granted to share information with : Yes, Verbal Permission Granted              Emotional Assessment Appearance:: Appears stated age Attitude/Demeanor/Rapport: Engaged, Gracious Affect (typically observed): Accepting, Appropriate, Calm, Hopeful, Pleasant Orientation: : Oriented to Self, Oriented to Place, Oriented to  Time, Oriented to Situation Alcohol / Substance Use: Not Applicable Psych Involvement: Yes (comment)  Admission diagnosis:  Acute inflammatory demyelinating polyneuropathy (HCC) [G61.0] Acute sensory neuropathy [G60.8] Guillain Barr syndrome (HCC) [G61.0] Patient Active Problem List   Diagnosis Date Noted   Acute sensory neuropathy 10/25/2022   Acute inflammatory demyelinating polyneuropathy (HCC) 10/24/2022   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/2022   B12 deficiency 10/18/2022   Gait abnormality 10/18/2022   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 10/10/2022   Pre-diabetes 09/25/2022   AAA (abdominal aortic aneurysm) (  HCC) 09/24/2022   Essential hypertension 09/24/2022   Iron deficiency 04/04/2022    Hypertriglyceridemia 04/03/2022   Coronary artery calcification 01/18/2022   Candidal intertrigo    Emphysema of lung (HCC) 07/26/2021   Aortic atherosclerosis (HCC) 07/26/2021   Internal hemorrhoids 01/23/2021   Diverticula of colon 01/23/2021   Eczema 01/23/2021   Actinic keratoses 01/23/2021   Tobacco use 09/02/2020   PCP:  Loyola Mast, MD Pharmacy:   [facility] [phone] - [place], [place] - [address] AT [facility] [address] [place] [address] Phone: [phone] Fax: [phone]     Social Determinants of Health (SDOH) Interventions    Readmission Risk Interventions     No data to display

## 2022-10-30 NOTE — Telephone Encounter (Signed)
Joe Greene will refax it to [phone]. Dm/cma

## 2022-10-30 NOTE — Progress Notes (Signed)
NIF -40 cm H2O VC 1.4L  Pt had good effort

## 2022-10-30 NOTE — Plan of Care (Signed)
  Problem: Health Behavior/Discharge Planning: Goal: Ability to manage health-related needs will improve Outcome: Progressing   Problem: Clinical Measurements: Goal: Ability to maintain clinical measurements within normal limits will improve Outcome: Progressing Goal: Diagnostic test results will improve Outcome: Progressing Goal: Respiratory complications will improve Outcome: Progressing   

## 2022-10-31 ENCOUNTER — Ambulatory Visit: Payer: Medicare PPO | Admitting: Physical Therapy

## 2022-10-31 DIAGNOSIS — E611 Iron deficiency: Secondary | ICD-10-CM | POA: Diagnosis not present

## 2022-10-31 DIAGNOSIS — G610 Guillain-Barre syndrome: Secondary | ICD-10-CM | POA: Diagnosis not present

## 2022-10-31 DIAGNOSIS — J439 Emphysema, unspecified: Secondary | ICD-10-CM | POA: Diagnosis not present

## 2022-10-31 DIAGNOSIS — I10 Essential (primary) hypertension: Secondary | ICD-10-CM | POA: Diagnosis not present

## 2022-10-31 LAB — CREATININE, SERUM
Creatinine, Ser: 0.96 mg/dL (ref 0.61–1.24)
GFR, Estimated: >60 mL/min (ref >60)

## 2022-10-31 NOTE — Telephone Encounter (Signed)
Form faxed to [facility]  Dm/cma

## 2022-10-31 NOTE — Progress Notes (Signed)
Physical Therapy Treatment Patient Details Name: Joe Greene MRN: [id number] DOB: 01-10-1951 Today's Date: 10/31/2022   History of Present Illness Pt is a 71 y.o. M who presents 10/24/2022 with Acute inflammatory demyelinating polyneuropathy. 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    PT Comments    Continuing work on functional mobility and activity tolerance;  session focused on transfers and amb; noting pt's increased distance and activity tolerance, which is promising; Also noting some gait deviations that are concerning for secondary musculoskeletal complications -- pt is tending to sink into genu recurvatum in stance both R and LLEs for stability; worth considering an Orthotist consult to consider AFOs for knee control, though it is worth waiting on that while continuing to monitor for functional return; Continue to recommend acute inpatient rehab (AIR) for post-acute therapy needs.   Recommendations for follow up therapy are one component of a multi-disciplinary discharge planning process, led by the attending physician.  Recommendations may be updated based on patient status, additional functional criteria and insurance authorization.  Follow Up Recommendations  Acute inpatient rehab (3hours/day)     Assistance Recommended at Discharge Frequent or constant Supervision/Assistance  Patient can return home with the following Two people to help with walking and/or transfers;A lot of help with bathing/dressing/bathroom;Assistance with cooking/housework;Help with stairs or ramp for entrance;Assist for transportation   Equipment Recommendations  None recommended by PT    Recommendations for Other Services Rehab consult     Precautions / Restrictions Precautions Precautions: Fall Restrictions Weight Bearing Restrictions: No     Mobility  Bed Mobility Overal bed mobility: Needs Assistance Bed Mobility: Supine to Sit     Supine to sit: HOB elevated, Min  guard     General bed mobility comments: + time and effort with use of bed features    Transfers Overall transfer level: Needs assistance Equipment used: Rolling walker (2 wheels) Transfers: Sit to/from Stand Sit to Stand: Min assist, Min guard           General transfer comment: MIN A to rise initially from EOB, pt prefers to pull on RW but progresses to min guard as session continues    Ambulation/Gait Ambulation/Gait assistance: Mod assist, Min assist Gait Distance (Feet): 50 Feet (x multiple bouts) Assistive device: Rolling walker (2 wheels) Gait Pattern/deviations: Step-through pattern, Decreased stride length, Knees buckling, Wide base of support       General Gait Details: Progressed with forward gait and turns, navigating 50 feet x2 with seated rest break.Mod assist for balancex 3 instances, LEs buckling but demonstrates good UE strength to support himself. Cues for sequencing and walker placement with turns. noting tendency for knees to push bck into hyperextension for stance stability; R ankle rolled once during today's walk   Stairs             Wheelchair Mobility    Modified Rankin (Stroke Patients Only)       Balance     Sitting balance-Leahy Scale: Fair       Standing balance-Leahy Scale: Poor                              Cognition Arousal/Alertness: Awake/alert Behavior During Therapy: WFL for tasks assessed/performed Overall Cognitive Status: Within Functional Limits for tasks assessed  General Comments: Able to communicate all areas he is having numbness, recall prevsious events recently leading up to admission.        Exercises      General Comments General comments (skin integrity, edema, etc.): Wife present and helpfu;      Pertinent Vitals/Pain Pain Assessment Pain Assessment: Faces Faces Pain Scale: Hurts a little bit Pain Location: BLE Pain Descriptors /  Indicators: Discomfort, Pins and needles, Numbness Pain Intervention(s): Monitored during session    Home Living                          Prior Function            PT Goals (current goals can now be found in the care plan section) Acute Rehab PT Goals Patient Stated Goal: Improve symptoms, return to PLOF PT Goal Formulation: With patient Time For Goal Achievement: 11/11/22 Potential to Achieve Goals: Good Progress towards PT goals: Progressing toward goals    Frequency    Min 4X/week      PT Plan Current plan remains appropriate    Co-evaluation              AM-PAC PT "6 Clicks" Mobility   Outcome Measure  Help needed turning from your back to your side while in a flat bed without using bedrails?: A Little Help needed moving from lying on your back to sitting on the side of a flat bed without using bedrails?: A Little Help needed moving to and from a bed to a chair (including a wheelchair)?: A Little Help needed standing up from a chair using your arms (e.g., wheelchair or bedside chair)?: A Little Help needed to walk in hospital room?: A Little Help needed climbing 3-5 steps with a railing? : Total 6 Click Score: 16    End of Session Equipment Utilized During Treatment: Gait belt Activity Tolerance: Patient tolerated treatment well Patient left: with call bell/phone within reach (on toilet; wife monitoring) Nurse Communication: Mobility status PT Visit Diagnosis: Unsteadiness on feet (R26.81);Other abnormalities of gait and mobility (R26.89);Difficulty in walking, not elsewhere classified (R26.2);Other symptoms and signs involving the nervous system (T70.177)     Time: 9390-3009 PT Time Calculation (min) (ACUTE ONLY): 21 min  Charges:  $Gait Training: 8-22 mins                     Van Clines, PT  Acute Rehabilitation Services Office [phone]    [name] 10/31/2022, 5:59 PM

## 2022-10-31 NOTE — Progress Notes (Signed)
Patient performed NIF and VC with good effort  NIF -30  VC 1.7L

## 2022-10-31 NOTE — Progress Notes (Signed)
NIF -40cmH20 VC 1.5 L  Pt had good effort

## 2022-10-31 NOTE — Progress Notes (Signed)
Inpatient Rehab Admissions Coordinator:   Per Therapy recommendations,  patient was screened for CIR candidacy by Molly Maselli, MS, CCC-SLP . At this time, Pt. Appears to be a a potential candidate for CIR. I will place   order for rehab consult per protocol for full assessment. Please contact me any with questions.  Schuyler Olden, MS, CCC-SLP Rehab Admissions Coordinator  [phone] (celll) [phone] (office)  

## 2022-10-31 NOTE — Progress Notes (Signed)
   PROGRESS NOTE    Joe Greene  BOF:[id number] DOB: [birth year] DOA: 10/24/2022 PCP: Loyola Mast, MD   Brief Narrative: Joe Greene is a 71 y.o. male with a history of Guillain-Barre syndrome. Patient presented secondary to worsening weakness and admitted for acute inflammatory demyelinating polyneuropathy. Neurology consulted and started plasma exchange therapy.   Assessment and Plan: * Acute inflammatory demyelinating polyneuropathy (HCC) Patient has a recent history of IVIG treatment for Guillain-Barre syndrome but presented with worsening paresthesias and weakness. Neurology consulted and have recommended plasma exchange x5; first session on 12/15. -Neurology recommendations: continued plasma exchange (12/21, 12/22); will reevaluate on 12/22 -Continue NIF q12 hours  Essential hypertension -Continue losartan  AAA (abdominal aortic aneurysm) (HCC) Stable. Outpatient follow-up.  Emphysema of lung (HCC) Stable.  Iron deficiency Patient is managed on iron supplementation as an outpatient. No associated anemia.   DVT prophylaxis: Lovenox Code Status:   Code Status: Full Code Family Communication: Wife and two prior students at bedside Disposition Plan: Discharge possibly to acute inpatient rehabilitation after completion of plasma exchange and neurology recommendations.   Consultants:  Neurology  Procedures:  None  Antimicrobials: None    Subjective: Continues to have improvement in function. Hoping to ambulate with therapy today.  Objective: BP 115/73   Pulse 84   Temp 98 F (36.7 C) (Oral)   Resp 18   Ht 5\' 10"  (1.778 m)   Wt 91.9 kg   SpO2 96%   BMI 29.07 kg/m   Examination:  General exam: Appears calm and comfortable   Data Reviewed: I have personally reviewed following labs and imaging studies  CBC Lab Results  Component Value Date   WBC 10.0 10/30/2022   RBC 4.88 10/30/2022   HGB 15.6 10/30/2022   HCT 46.0 10/30/2022   MCV 96.1 10/30/2022    MCH 32.8 10/30/2022   PLT 202 10/30/2022   MCHC 34.1 10/30/2022   RDW 13.0 10/30/2022   LYMPHSABS 2.5 10/24/2022   MONOABS 0.7 10/24/2022   EOSABS 0.6 (H) 10/24/2022   BASOSABS 0.1 10/24/2022     Last metabolic panel Lab Results  Component Value Date   NA 138 10/30/2022   K 4.0 10/30/2022   CL 102 10/30/2022   CO2 23 10/29/2022   BUN 19 10/30/2022   CREATININE 0.96 10/31/2022   GLUCOSE 106 (H) 10/30/2022   GFRNONAA >60 10/31/2022   CALCIUM 9.2 10/29/2022   PROT 7.8 10/24/2022   ALBUMIN 4.0 10/24/2022   BILITOT 0.3 10/24/2022   ALKPHOS 42 10/24/2022   AST 24 10/24/2022   ALT 37 10/24/2022   ANIONGAP 9 10/29/2022    GFR: Estimated Creatinine Clearance: 80.5 mL/min (by C-G formula based on SCr of 0.96 mg/dL).  No results found for this or any previous visit (from the past 240 hour(s)).    Radiology Studies: No results found.    LOS: 6 days    10/31/2022, MD [facility] 10/31/2022, 12:17 PM   If 7PM-7AM, please contact night-coverage [email or web address]

## 2022-11-01 ENCOUNTER — Telehealth: Payer: Self-pay | Admitting: Neurology

## 2022-11-01 DIAGNOSIS — G610 Guillain-Barre syndrome: Secondary | ICD-10-CM | POA: Diagnosis not present

## 2022-11-01 DIAGNOSIS — I10 Essential (primary) hypertension: Secondary | ICD-10-CM | POA: Diagnosis not present

## 2022-11-01 DIAGNOSIS — E611 Iron deficiency: Secondary | ICD-10-CM | POA: Diagnosis not present

## 2022-11-01 DIAGNOSIS — J439 Emphysema, unspecified: Secondary | ICD-10-CM | POA: Diagnosis not present

## 2022-11-01 LAB — BASIC METABOLIC PANEL
Anion gap: 11 (ref 5–15)
BUN: 21 mg/dL (ref 8–23)
CO2: 22 mmol/L (ref 22–32)
Calcium: 8.9 mg/dL (ref 8.9–10.3)
Chloride: 105 mmol/L (ref 98–111)
Creatinine, Ser: 1.05 mg/dL (ref 0.61–1.24)
GFR, Estimated: >60 mL/min (ref >60)
Glucose, Bld: 126 mg/dL — ABNORMAL HIGH (ref 70–99)
Potassium: 4 mmol/L (ref 3.5–5.1)
Sodium: 138 mmol/L (ref 135–145)

## 2022-11-01 LAB — CBC
HCT: 41.8 % (ref 39.0–52.0)
Hemoglobin: 14.6 g/dL (ref 13.0–17.0)
MCH: 33.9 pg (ref 26.0–34.0)
MCHC: 34.9 g/dL (ref 30.0–36.0)
MCV: 97 fL (ref 80.0–100.0)
Platelets: 186 10*3/uL (ref 150–400)
RBC: 4.31 MIL/uL (ref 4.22–5.81)
RDW: 13.2 % (ref 11.5–15.5)
WBC: 9 10*3/uL (ref 4.0–10.5)
nRBC: 0 % (ref 0.0–0.2)

## 2022-11-01 MED ORDER — DIPHENHYDRAMINE HCL 25 MG PO CAPS: 25.0000 mg | ORAL_CAPSULE | Freq: Four times a day (QID) | ORAL | Status: DC | PRN

## 2022-11-01 MED ORDER — SODIUM CHLORIDE 0.9 % IV SOLN
Freq: Once | INTRAVENOUS | Status: AC
  Filled 2022-11-01 (×3): qty 200

## 2022-11-01 MED ORDER — CALCIUM GLUCONATE-NACL 2-0.675 GM/100ML-% IV SOLN
INTRAVENOUS | Status: AC
  Filled 2022-11-01: qty 100

## 2022-11-01 MED ORDER — CALCIUM GLUCONATE-NACL 2-0.675 GM/100ML-% IV SOLN
2.0000 g | Freq: Once | INTRAVENOUS | Status: AC
  Administered 2022-11-01: 2000 mg via INTRAVENOUS
  Filled 2022-11-01: qty 100

## 2022-11-01 MED ORDER — CALCIUM CARBONATE ANTACID 500 MG PO CHEW
CHEWABLE_TABLET | ORAL | Status: AC
  Filled 2022-11-01: qty 2

## 2022-11-01 MED ORDER — HEPARIN SODIUM (PORCINE) 1000 UNIT/ML IJ SOLN
1000.0000 [IU] | Freq: Once | INTRAMUSCULAR | Status: AC
  Administered 2022-11-01: 1000 [IU]

## 2022-11-01 MED ORDER — CALCIUM CARBONATE ANTACID 500 MG PO CHEW
2.0000 | CHEWABLE_TABLET | ORAL | Status: AC
  Administered 2022-11-01 (×2): 400 mg via ORAL
  Filled 2022-11-01: qty 2

## 2022-11-01 MED ORDER — ACETAMINOPHEN 325 MG PO TABS: 650.0000 mg | ORAL_TABLET | ORAL | Status: DC | PRN

## 2022-11-01 MED ORDER — HEPARIN SODIUM (PORCINE) 1000 UNIT/ML IJ SOLN
INTRAMUSCULAR | Status: AC
  Filled 2022-11-01: qty 3

## 2022-11-01 MED ORDER — ACD FORMULA A 0.73-2.45-2.2 GM/100ML VI SOLN
1000.0000 mL | Status: DC
  Administered 2022-11-01: 1000 mL
  Filled 2022-11-01: qty 1000

## 2022-11-01 NOTE — Progress Notes (Signed)
  Inpatient Rehabilitation Admissions Coordinator   Met with patient at bedside in dialysis for rehab assessment. I am familiar with patient form previous admission. We discussed goals and expectations of a possible CIR admit. He prefers CIR for rehab. Family can provide expected caregiver support that is recommended . I will begin insurance Auth with [facility] Medicare for possible CIR admit pending approval. Please call me with any questions.   Danne Baxter, RN, MSN Rehab Admissions Coordinator [phone]

## 2022-11-01 NOTE — Progress Notes (Signed)
Occupational Therapy Treatment Patient Details Name: Joe Greene MRN: [id number] DOB: 10-Dec-1950 Today's Date: 11/01/2022   History of present illness Pt is a 71 y.o. M who presents 10/24/2022 with Acute inflammatory demyelinating polyneuropathy. Significant HUD:JSHFWYOV Barre syndrome 09/2022 treated with IV IG   OT comments  Pt progressing towards established OT goals. Focus session on activity tolerance, FM dexterity, and retraining of BADL. Pt performing LB dressing to don socks/shoes sitting EOB with supervision. Performing toilet transfer with min A for balance due to difficulty with sensation in feet. Addressing activity tolerance with functional mobility in hall of ~145ft. Frequent min A for balance and intermittent cues for step pattern. Challenging dexterity with buttoning buttons on gown, and pt observed with heavy reliance on vision and increased time to perform successfully.Pt continues to progress towards established OT goals and continue to highly recommend AIR to optimize safety and independence in ADL and IADL.    Recommendations for follow up therapy are one component of a multi-disciplinary discharge planning process, led by the attending physician.  Recommendations may be updated based on patient status, additional functional criteria and insurance authorization.    Follow Up Recommendations  Acute inpatient rehab (3hours/day)     Assistance Recommended at Discharge Intermittent Supervision/Assistance  Patient can return home with the following  Two people to help with walking and/or transfers;A little help with bathing/dressing/bathroom;Assistance with cooking/housework;Assist for transportation;Help with stairs or ramp for entrance   Equipment Recommendations  Other (comment) (defer to next venue of care)    Recommendations for Other Services      Precautions / Restrictions Precautions Precautions: Fall Restrictions Weight Bearing Restrictions: No       Mobility  Bed Mobility Overal bed mobility: Needs Assistance Bed Mobility: Supine to Sit     Supine to sit: HOB elevated, Min guard     General bed mobility comments: increased time and effort    Transfers Overall transfer level: Needs assistance Equipment used: Rolling walker (2 wheels) Transfers: Sit to/from Stand Sit to Stand: Min guard           General transfer comment: Min guard A for safety     Balance Overall balance assessment: Needs assistance Sitting-balance support: No upper extremity supported, Feet supported Sitting balance-Leahy Scale: Fair     Standing balance support: Bilateral upper extremity supported, During functional activity Standing balance-Leahy Scale: Poor Standing balance comment: MIn A when performing static standing tasks with BUE (thus, no UE support)                           ADL either performed or assessed with clinical judgement   ADL Overall ADL's : Needs assistance/impaired Eating/Feeding: Set up;Sitting Eating/Feeding Details (indicate cue type and reason): Pt self feeding when initially entering room with min difficulty. limited spillage and no assist needed. Pt not interested in built up grip Grooming: Standing;Oral care;Minimal assistance;Min guard Grooming Details (indicate cue type and reason): Min guard-min A for balance when using BUE         Upper Body Dressing : Set up;Sitting Upper Body Dressing Details (indicate cue type and reason): donning gown on back side. Pt buttoning buttons on gown with increased time and reliance on vision to line up buttons. Lower Body Dressing: Sitting/lateral leans;Supervision/safety Lower Body Dressing Details (indicate cue type and reason): Adusting socks supine, but able to perform figure 4 to don socks sitting EOB with supervision Toilet Transfer: Minimal assistance;Rolling walker (2 wheels);Ambulation Toilet Transfer  Details (indicate cue type and reason): Min A for balance during  functional mobility into restroom. Pt reports his L foot feels like it is dragging, however, not observed to be actively dragging floor Toileting- Clothing Manipulation and Hygiene: Min guard;Sitting/lateral lean       Functional mobility during ADLs: Minimal assistance;Rolling walker (2 wheels) (OT pulling chair behind pt for rest break if needed, but pt denying need for rest break. Mild instability of R knee at times, and pt continues to complain of decreased feeling in feet;) General ADL Comments: ADl participation impacted by decreased activity tolerance, impaired balance. decreased BUE FMC and generalized deconditioning    Extremity/Trunk Assessment Upper Extremity Assessment Upper Extremity Assessment: Generalized weakness;RUE deficits/detail;LUE deficits/detail RUE Deficits / Details: reports numbness and decreased dexterity, also reprots able to complete ADLs with + time RUE Sensation: decreased light touch;decreased proprioception RUE Coordination: decreased fine motor;decreased gross motor LUE Deficits / Details: reports numbness and decreased dexterity, also reprots able to complete ADLs with + time LUE Sensation: decreased light touch;decreased proprioception LUE Coordination: decreased fine motor;decreased gross motor   Lower Extremity Assessment Lower Extremity Assessment: Defer to PT evaluation        Vision   Vision Assessment?: No apparent visual deficits   Perception Perception Perception: Within Functional Limits   Praxis Praxis Praxis: Intact    Cognition Arousal/Alertness: Awake/alert Behavior During Therapy: WFL for tasks assessed/performed Overall Cognitive Status: Within Functional Limits for tasks assessed                                 General Comments: Able to communicate all areas in which he is experiencing numbness as well as how it has/hasnl;'t changed since day prior.        Exercises      Shoulder Instructions        General Comments      Pertinent Vitals/ Pain       Pain Assessment Pain Assessment: Faces Faces Pain Scale: Hurts little more Pain Location: BLE after functional mobility Pain Descriptors / Indicators: Discomfort, Pins and needles, Numbness Pain Intervention(s): Limited activity within patient's tolerance, Monitored during session, Repositioned  Home Living                                          Prior Functioning/Environment              Frequency  Min 2X/week        Progress Toward Goals  OT Goals(current goals can now be found in the care plan section)  Progress towards OT goals: Progressing toward goals  Acute Rehab OT Goals Patient Stated Goal: get back to moving OT Goal Formulation: With patient/family Time For Goal Achievement: 11/11/22 Potential to Achieve Goals: Good ADL Goals Pt Will Perform Grooming: standing;with modified independence Pt Will Perform Lower Body Dressing: sit to/from stand;with modified independence Pt Will Transfer to Toilet: with supervision;ambulating;regular height toilet Additional ADL Goal #1: Pt will demonstrate use of compensatory techniques for sensory loss of BUE.  Plan Discharge plan remains appropriate;Frequency remains appropriate    Co-evaluation                 AM-PAC OT "6 Clicks" Daily Activity     Outcome Measure   Help from another person eating meals?: A Little Help from another person  taking care of personal grooming?: A Little Help from another person toileting, which includes using toliet, bedpan, or urinal?: A Little Help from another person bathing (including washing, rinsing, drying)?: A Little Help from another person to put on and taking off regular upper body clothing?: A Little Help from another person to put on and taking off regular lower body clothing?: A Little 6 Click Score: 18    End of Session Equipment Utilized During Treatment: Gait belt;Rolling walker (2  wheels)  OT Visit Diagnosis: Unsteadiness on feet (R26.81);Repeated falls (R29.6);Muscle weakness (generalized) (M62.81)   Activity Tolerance Patient tolerated treatment well   Patient Left in chair;with call bell/phone within reach   Nurse Communication Mobility status        Time: 1031-5945 OT Time Calculation (min): 33 min  Charges: OT General Charges $OT Visit: 1 Visit OT Treatments $Self Care/Home Management : 23-37 mins  Tyler Deis, OTR/L [facility] Acute Rehabilitation Office: [phone]   Myrla Halsted 11/01/2022, 3:18 PM

## 2022-11-01 NOTE — Progress Notes (Signed)
Pt. Performed -32 on the NIF and 2.7L on the vital capacity.

## 2022-11-01 NOTE — Progress Notes (Signed)
   PROGRESS NOTE    Joe Greene  VVO:[id number] DOB: 21-Oct-1951 DOA: 10/24/2022 PCP: Loyola Mast, MD   Brief Narrative: Joe Greene is a 71 y.o. male with a history of Guillain-Barre syndrome. Patient presented secondary to worsening weakness and admitted for acute inflammatory demyelinating polyneuropathy. Neurology consulted and started plasma exchange therapy.   Assessment and Plan: * Acute inflammatory demyelinating polyneuropathy (HCC) Patient has a recent history of IVIG treatment for Guillain-Barre syndrome but presented with worsening paresthesias and weakness. Neurology consulted and have recommended plasma exchange x5; first session on 12/15. -Neurology recommendations: continued plasma exchange (12/21, 12/22); will reevaluate on 12/22 -Continue NIF q12 hours  Essential hypertension -Continue losartan  AAA (abdominal aortic aneurysm) (HCC) Stable. Outpatient follow-up.  Emphysema of lung (HCC) Stable.  Iron deficiency Patient is managed on iron supplementation as an outpatient. No associated anemia.   DVT prophylaxis: Lovenox Code Status:   Code Status: Full Code Family Communication: Wife and two prior students at bedside Disposition Plan: Discharge possibly to acute inpatient rehabilitation after completion of plasma exchange and neurology recommendations.   Consultants:  Neurology  Procedures:  None  Antimicrobials: None    Subjective: No issues this morning. On his way to the hemodialysis unit. NIF -40 today.  Objective: BP 128/76   Pulse 81   Temp 97.6 F (36.4 C) (Oral)   Resp 18   Ht 5\' 10"  (1.778 m)   Wt 89.6 kg   SpO2 97%   BMI 28.34 kg/m   Examination:  General exam: Appears calm and comfortable   Data Reviewed: I have personally reviewed following labs and imaging studies  CBC Lab Results  Component Value Date   WBC 9.0 11/01/2022   RBC 4.31 11/01/2022   HGB 14.6 11/01/2022   HCT 41.8 11/01/2022   MCV 97.0 11/01/2022    MCH 33.9 11/01/2022   PLT 186 11/01/2022   MCHC 34.9 11/01/2022   RDW 13.2 11/01/2022   LYMPHSABS 2.5 10/24/2022   MONOABS 0.7 10/24/2022   EOSABS 0.6 (H) 10/24/2022   BASOSABS 0.1 10/24/2022     Last metabolic panel Lab Results  Component Value Date   NA 138 11/01/2022   K 4.0 11/01/2022   CL 105 11/01/2022   CO2 22 11/01/2022   BUN 21 11/01/2022   CREATININE 1.05 11/01/2022   GLUCOSE 126 (H) 11/01/2022   GFRNONAA >60 11/01/2022   CALCIUM 8.9 11/01/2022   PROT 7.8 10/24/2022   ALBUMIN 4.0 10/24/2022   BILITOT 0.3 10/24/2022   ALKPHOS 42 10/24/2022   AST 24 10/24/2022   ALT 37 10/24/2022   ANIONGAP 11 11/01/2022    GFR: Estimated Creatinine Clearance: 72.7 mL/min (by C-G formula based on SCr of 1.05 mg/dL).  No results found for this or any previous visit (from the past 240 hour(s)).    Radiology Studies: No results found.    LOS: 7 days    11/03/2022, MD [facility] 11/01/2022, 12:59 PM   If 7PM-7AM, please contact night-coverage [email or web address]

## 2022-11-01 NOTE — Progress Notes (Signed)
NIF -40, VC 2L good patient effort.

## 2022-11-01 NOTE — Telephone Encounter (Signed)
His IVIG was approved through intra fusion, chart reviewed, patient is still at the hospital plasma exchange  Please call patient and check on his progress in the middle of January 2024, if needed,may move up his follow up appointment, start IVIG after follow-up visit

## 2022-11-01 NOTE — PMR Pre-admission (Signed)
PMR Admission Coordinator Pre-Admission Assessment  Patient: Joe Greene is an 71 y.o., male MRN: [id number] DOB: 10-Sep-1951 Height: _0  (177.8 cm) Weight: 90.5 kg  Insurance Information HMO:     PPO: yes     PCP:      IPA:      80/20:      OTHER:  PRIMARY: [employer]      Policy#: [id number]      Subscriber: pt CM Name: Dawson Bills   Phone#: [phone] ext [phone]     Fax#: [phone] Pre-Cert#: [id number] approved for 7 days. F/u with Edwena Felty at ext [phone] fax [phone]     Employer:  Benefits:  Phone #: [phone]     Name: 12/21 Eff. Date: 11/13/19     Deduct: none      Out of Pocket Max: $4000       CIR: $160 co pay per day days 1 until 10      SNF: no c co pay days 1 until 20; $50 c copay per day days 21 until 100 Outpatient: $20 per visit     Co-Pay: vitis per medical neccesity Home Health: 100%      Co-Pay: visits per medical neccesity DME: 80%     Co-Pay: 20% Providers: in network  SECONDARY: none  Financial Counselor:       Phone#:   The Engineer, [employer]" for patients in Inpatient Rehabilitation Facilities with attached "Privacy Act [place] Records" was provided and verbally reviewed with: Patient  Emergency Contact Information Contact Information     Name Relation Home Work Mobile   Hend[place] Spouse   [phone]      Current Medical History  Patient 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 Presented on 10/24/22 with worsening weakness. Neurology consulted.  Neurology recommended plasma exchange for 5 days, first session on 12/15. And ending 12/22. Close monitoring of NIF q 12 hours.Continue losartan for HTN. Lovenox for DVT prophylaxis.   Patient's medical record from [facility] has been reviewed by the rehabilitation admission coordinator and  physician.  Past Medical History  Past Medical History:  Diagnosis Date   AAA (abdominal aortic aneurysm) ([place])    Cataract    forming    Has the patient had major surgery during 100 days prior to admission? No  Family History   family history includes AAA (abdominal aortic aneurysm) in his mother; Cancer in his father; Stroke (age of onset: 31) in his mother.  Current Medications  Current Facility-Administered Medications:    acetaminophen (TYLENOL) tablet 650 mg, 650 mg, Oral, Q6H PRN, 650 mg at 11/01/22 0946 **OR** acetaminophen (TYLENOL) suppository 650 mg, 650 mg, Rectal, Q6H PRN, Opyd, Ilene Qua, MD   albumin human 25 % 50 g in sodium chloride 0.9 %, , Dialysis, Q1 Hr x 3, Lorrin Goodell, Salman, MD   calcium carbonate (TUMS - dosed in mg elemental calcium) 500 MG chewable tablet, , , ,    calcium gluconate 2 g/ 100 mL sodium chloride IVPB, 2 g, Intravenous, Once, Donnetta Simpers, MD   calcium gluconate 2-0.675 GM/100ML-% IVPB, , , ,    Chlorhexidine Gluconate Cloth 2 % PADS 6 each, 6 each, Topical, Daily, Arrien, Jimmy Picket, MD, 6 each at 11/01/22 1000   citrate dextrose (ACD-A anticoagulant) solution 1,000 mL, 1,000 mL, Other, Continuous, Donnetta Simpers, MD   enoxaparin (LOVENOX) injection 40  mg, 40 mg, Subcutaneous, Daily, Opyd, Ilene Qua, MD, 40 mg at 11/02/22 0908   gabapentin (NEURONTIN) capsule 300 mg, 300 mg, Oral, TID, Opyd, Ilene Qua, MD, 300 mg at 11/02/22 7353   heparin sodium (porcine) 1000 UNIT/ML injection, , , ,    heparin sodium (porcine) injection 1,000 Units, 1,000 Units, Intracatheter, Once, Donnetta Simpers, MD   losartan (COZAAR) tablet 25 mg, 25 mg, Oral, Daily, Opyd, Timothy S, MD, 25 mg at 11/01/22 1245   nicotine (NICODERM CQ - dosed in mg/24 hours) patch 14 mg, 14 mg, Transdermal, Daily, Arrien, Jimmy Picket, MD, 14 mg at 11/02/22 0908   ondansetron (ZOFRAN) tablet 4 mg, 4 mg, Oral, Q6H PRN, 4 mg at 11/01/22 0946 **OR** ondansetron (ZOFRAN)  injection 4 mg, 4 mg, Intravenous, Q6H PRN, Opyd, Ilene Qua, MD, 4 mg at 10/26/22 1955   Oral care mouth rinse, 15 mL, Mouth Rinse, PRN, Mariel Aloe, MD   polyethylene glycol (MIRALAX / GLYCOLAX) packet 17 g, 17 g, Oral, Daily PRN, Opyd, Ilene Qua, MD, 17 g at 10/30/22 1738  Patients Current Diet:  Diet Order             Diet regular Room service appropriate? Yes; Fluid consistency: Thin  Diet effective now                  Precautions / Restrictions Precautions Precautions: Fall Restrictions Weight Bearing Restrictions: No   Has the patient had 2 or more falls or a fall with injury in the past year? No  Prior Activity Level Community (5-7x/wk): Independent  Prior Functional Level Self Care: Did the patient need help bathing, dressing, using the toilet or eating? Independent  Indoor Mobility: Did the patient need assistance with walking from room to room (with or without device)? Independent  Stairs: Did the patient need assistance with internal or external stairs (with or without device)? Independent  Functional Cognition: Did the patient need help planning regular tasks such as shopping or remembering to take medications? Independent  Patient Information Are you of Hispanic, Latino/a,or Spanish origin?: A. No, not of Hispanic, Latino/a, or Spanish origin What is your race?: A. White Do you need or want an interpreter to communicate with a doctor or health care staff?: 0. No  Patient's Response To:  Health Literacy and Transportation Is the patient able to respond to health literacy and transportation needs?: Yes Health Literacy - How often do you need to have someone help you when you read instructions, pamphlets, or other written material from your doctor or pharmacy?: Never In the past 12 months, has lack of transportation kept you from medical appointments or from getting medications?: No In the past 12 months, has lack of transportation kept you from meetings,  work, or from getting things needed for daily living?: No  Home Assistive Devices / [place] Devices/Equipment: Environmental consultant (specify type) (front roller) Home Equipment: Grab bars - tub/shower, Shower seat, Conservation officer, nature (2 wheels), Wheelchair - manual  Prior Device Use: Indicate devices/aids used by the patient prior to current illness, exacerbation or injury? None of the above  Current Functional Level Cognition  Overall Cognitive Status: Within Functional Limits for tasks assessed Orientation Level: Oriented X4 General Comments: Able to communicate all areas in which he is experiencing numbness as well as how it has/hasnl;'t changed since day prior.    Extremity Assessment (includes Sensation/Coordination)  Upper Extremity Assessment: Generalized weakness, RUE deficits/detail, LUE deficits/detail RUE Deficits / Details: reports numbness and decreased dexterity,  also reprots able to complete ADLs with + time RUE Sensation: decreased light touch, decreased proprioception RUE Coordination: decreased fine motor, decreased gross motor LUE Deficits / Details: reports numbness and decreased dexterity, also reprots able to complete ADLs with + time LUE Sensation: decreased light touch, decreased proprioception LUE Coordination: decreased fine motor, decreased gross motor  Lower Extremity Assessment: Defer to PT evaluation RLE Deficits / Details: Rt hip flexion 4-/5, abduction 4/5, knee extension 4/5, dorsiflexion 4/5, hallux extension 4/5. RLE Sensation: decreased light touch, decreased proprioception LLE Deficits / Details: Lt hip flexion 4-/5, abduction 4-/5, knee extension 4/5, dorsiflexion 4-/5, hallux extension 4/5. LLE Sensation: decreased light touch, decreased proprioception    ADLs  Overall ADL's : Needs assistance/impaired Eating/Feeding: Set up, Sitting Eating/Feeding Details (indicate cue type and reason): Pt self feeding when initially entering room with min  difficulty. limited spillage and no assist needed. Pt not interested in built up grip Grooming: Standing, Oral care, Minimal assistance, Min guard Grooming Details (indicate cue type and reason): Min guard-min A for balance when using BUE Upper Body Bathing: Set up, Sitting Lower Body Bathing: Min guard, Sitting/lateral leans Lower Body Bathing Details (indicate cue type and reason): simulated, able to figure 4 Upper Body Dressing : Set up, Sitting Upper Body Dressing Details (indicate cue type and reason): donning gown on back side. Pt buttoning buttons on gown with increased time and reliance on vision to line up buttons. Lower Body Dressing: Sitting/lateral leans, Supervision/safety Lower Body Dressing Details (indicate cue type and reason): Adusting socks supine, but able to perform figure 4 to don socks sitting EOB with supervision Toilet Transfer: Minimal assistance, Rolling walker (2 wheels), Ambulation Toilet Transfer Details (indicate cue type and reason): Min A for balance during functional mobility into restroom. Pt reports his L foot feels like it is dragging, however, not observed to be actively dragging floor Toileting- Clothing Manipulation and Hygiene: Min guard, Sitting/lateral lean Toileting - Clothing Manipulation Details (indicate cue type and reason): Wife holding urinal to use in standing position. Functional mobility during ADLs: Minimal assistance, Rolling walker (2 wheels) (OT pulling chair behind pt for rest break if needed, but pt denying need for rest break. Mild instability of R knee at times, and pt continues to complain of decreased feeling in feet;) General ADL Comments: ADl participation impacted by decreased activity tolerance, impaired balance. decreased BUE FMC and generalized deconditioning    Mobility  Overal bed mobility: Needs Assistance Bed Mobility: Supine to Sit Supine to sit: HOB elevated, Min guard Sit to supine: Min guard, HOB elevated General bed  mobility comments: increased time and effort    Transfers  Overall transfer level: Needs assistance Equipment used: Rolling walker (2 wheels) Transfers: Sit to/from Stand Sit to Stand: Min guard General transfer comment: Min guard A for safety    Ambulation / Gait / Stairs / Wheelchair Mobility  Ambulation/Gait Ambulation/Gait assistance: Mod assist, Min assist Gait Distance (Feet): 50 Feet (x multiple bouts) Assistive device: Rolling walker (2 wheels) Gait Pattern/deviations: Step-through pattern, Decreased stride length, Knees buckling, Wide base of support General Gait Details: Progressed with forward gait and turns, navigating 50 feet x2 with seated rest break.Mod assist for balancex 3 instances, LEs buckling but demonstrates good UE strength to support himself. Cues for sequencing and walker placement with turns. noting tendency for knees to push bck into hyperextension for stance stability; R ankle rolled once during today's walk Gait velocity: decreased Gait velocity interpretation: <1.31 ft/sec, indicative of household ambulator Pre-gait  activities: Tolerated several pre-gait activities focusing on weight shift, march, and balance at EOB prior to walking.    Posture / Balance Balance Overall balance assessment: Needs assistance Sitting-balance support: No upper extremity supported, Feet supported Sitting balance-Leahy Scale: Fair Standing balance support: Bilateral upper extremity supported, During functional activity Standing balance-Leahy Scale: Poor Standing balance comment: MIn A when performing static standing tasks with BUE (thus, no UE support)    Special needs/care consideration Fall precautions   Previous Home Environment  Living Arrangements: Spouse/significant other  Lives With: Spouse Available Help at Discharge: Family, Available 24 hours/day Type of Home: House Home Layout: Able to live on main level with bedroom/bathroom Home Access: Ramped  entrance Bathroom Shower/Tub: Multimedia programmer: Associate Professor Accessibility: Yes Home Care Services: No Additional Comments: DME was wife's after prior hospitalization  Discharge Living Setting Plans for Discharge Living Setting: Patient's home, Lives with (comment) (wife) Type of Home at Discharge: House Discharge Home Layout: One level, Able to live on main level with bedroom/bathroom Discharge Home Access: [place] entrance Discharge Bathroom Shower/Tub: Walk-in shower Discharge Bathroom Toilet: Standard Discharge Bathroom Accessibility: Yes How Accessible: Accessible via walker Does the patient have any problems obtaining your medications?: No  Social/Family/Support Systems Patient Roles: Spouse Contact Information: Joe Greene, wife Anticipated Caregiver: wife Anticipated Ambulance person Information: see contacts Ability/Limitations of Caregiver: no limitations Caregiver Availability: 24/7 Discharge Plan Discussed with Primary Caregiver: Yes Is Caregiver In Agreement with Plan?: Yes Does Caregiver/Family have Issues with Lodging/Transportation while Pt is in Rehab?: No  Goals Patient/Family Goal for Rehab: Mod I to supervision with PT and OT Expected length of stay: ELOS 7 to 10 days Pt/Family Agrees to Admission and willing to participate: Yes Program Orientation Provided & Reviewed with Pt/Caregiver Including Roles  & Responsibilities: Yes  Decrease burden of Care through IP rehab admission: n/a  Possible need for SNF placement upon discharge: not anticipated  Patient Condition: I have reviewed medical records from [facility], spoken with patient. I met with patient at the bedside for inpatient rehabilitation assessment.  Patient will benefit from ongoing PT and OT, can actively participate in 3 hours of therapy a day 5 days of the week, and can make measurable gains during the admission.  Patient will also benefit from the coordinated team  approach during an Inpatient Acute Rehabilitation admission.  The patient will receive intensive therapy as well as Rehabilitation physician, nursing, social worker, and care management interventions.  Due to bladder management, bowel management, safety, skin/wound care, disease management, medication administration, pain management, and patient education the patient requires 24 hour a day rehabilitation nursing.  The patient is currently min assist overall with mobility and basic ADLs.  Discharge setting and therapy post discharge at home with outpatient is anticipated.  Patient has agreed to participate in the Acute Inpatient Rehabilitation Program and will admit today.  Preadmission Screen Completed By:  Joe Greene, 11/02/2022 12:39 PM ______________________________________________________________________   Discussed status with Dr. Curlene Dolphin on 11/02/22 at 1239 and received approval for admission today.  Admission Coordinator:  Joe Burke, RN, time 1239 Date 11/02/22   Assessment/Plan: Diagnosis: AIDP Does the need for close, 24 hr/day Medical supervision in concert with the patient's rehab needs make it unreasonable for this patient to be served in a less intensive setting? Yes Co-Morbidities requiring supervision/potential complications: HTN, AAA, neuropathy, emphysema, iron deficiency Due to bladder management, bowel management, safety, skin/wound care, disease management, medication administration, pain management, and patient education, does the patient  require 24 hr/day rehab nursing? Yes Does the patient require coordinated care of a physician, rehab nurse, PT, OT, and SLP to address physical and functional deficits in the context of the above medical diagnosis(es)? Yes Addressing deficits in the following areas: balance, endurance, locomotion, strength, transferring, bowel/bladder control, bathing, dressing, feeding, grooming, toileting, and psychosocial  support Can the patient actively participate in an intensive therapy program of at least 3 hrs of therapy 5 days a week? Yes The potential for patient to make measurable gains while on inpatient rehab is excellent Anticipated functional outcomes upon discharge from inpatient rehab: modified independent and supervision PT, modified independent and supervision OT, n/a SLP Estimated rehab length of stay to reach the above functional goals is: 7-10 Anticipated discharge destination: Home 10. Overall Rehab/Functional Prognosis: excellent   MD Signature: Jennye Boroughs

## 2022-11-02 ENCOUNTER — Inpatient Hospital Stay (HOSPITAL_COMMUNITY)
Admission: RE | Admit: 2022-11-02 | Payer: Medicare PPO | Source: Intra-hospital | Admitting: Physical Medicine & Rehabilitation

## 2022-11-02 ENCOUNTER — Inpatient Hospital Stay (HOSPITAL_COMMUNITY)
Admission: RE | Admit: 2022-11-02 | Discharge: 2022-11-10 | DRG: 945 | Disposition: A | Discharge status: Home / Self Care | Payer: Medicare PPO | Source: Intra-hospital | Attending: Physical Medicine & Rehabilitation | Admitting: Physical Medicine & Rehabilitation

## 2022-11-02 ENCOUNTER — Other Ambulatory Visit: Payer: Self-pay

## 2022-11-02 ENCOUNTER — Encounter (HOSPITAL_COMMUNITY): Payer: Self-pay | Admitting: Physical Medicine & Rehabilitation

## 2022-11-02 DIAGNOSIS — Z8249 Family history of ischemic heart disease and other diseases of the circulatory system: Secondary | ICD-10-CM | POA: Diagnosis not present

## 2022-11-02 DIAGNOSIS — Z79899 Other long term (current) drug therapy: Secondary | ICD-10-CM

## 2022-11-02 DIAGNOSIS — I714 Abdominal aortic aneurysm, without rupture, unspecified: Secondary | ICD-10-CM | POA: Diagnosis not present

## 2022-11-02 DIAGNOSIS — J439 Emphysema, unspecified: Secondary | ICD-10-CM | POA: Diagnosis not present

## 2022-11-02 DIAGNOSIS — E611 Iron deficiency: Secondary | ICD-10-CM | POA: Diagnosis not present

## 2022-11-02 DIAGNOSIS — I10 Essential (primary) hypertension: Secondary | ICD-10-CM | POA: Diagnosis not present

## 2022-11-02 DIAGNOSIS — G610 Guillain-Barre syndrome: Secondary | ICD-10-CM | POA: Diagnosis not present

## 2022-11-02 DIAGNOSIS — Z87891 Personal history of nicotine dependence: Secondary | ICD-10-CM | POA: Diagnosis not present

## 2022-11-02 DIAGNOSIS — Z741 Need for assistance with personal care: Secondary | ICD-10-CM | POA: Diagnosis present

## 2022-11-02 DIAGNOSIS — Z7982 Long term (current) use of aspirin: Secondary | ICD-10-CM | POA: Diagnosis not present

## 2022-11-02 DIAGNOSIS — I7143 Infrarenal abdominal aortic aneurysm, without rupture: Secondary | ICD-10-CM | POA: Diagnosis not present

## 2022-11-02 DIAGNOSIS — J438 Other emphysema: Secondary | ICD-10-CM

## 2022-11-02 DIAGNOSIS — Z823 Family history of stroke: Secondary | ICD-10-CM

## 2022-11-02 DIAGNOSIS — Z8042 Family history of malignant neoplasm of prostate: Secondary | ICD-10-CM | POA: Diagnosis not present

## 2022-11-02 DIAGNOSIS — R5381 Other malaise: Principal | ICD-10-CM | POA: Diagnosis present

## 2022-11-02 DIAGNOSIS — R7989 Other specified abnormal findings of blood chemistry: Secondary | ICD-10-CM | POA: Diagnosis not present

## 2022-11-02 DIAGNOSIS — I959 Hypotension, unspecified: Secondary | ICD-10-CM | POA: Diagnosis present

## 2022-11-02 DIAGNOSIS — E119 Type 2 diabetes mellitus without complications: Secondary | ICD-10-CM | POA: Diagnosis not present

## 2022-11-02 LAB — BASIC METABOLIC PANEL
Anion gap: 8 (ref 5–15)
BUN: 17 mg/dL (ref 8–23)
CO2: 22 mmol/L (ref 22–32)
Calcium: 9.7 mg/dL (ref 8.9–10.3)
Chloride: 109 mmol/L (ref 98–111)
Creatinine, Ser: 0.96 mg/dL (ref 0.61–1.24)
GFR, Estimated: >60 mL/min (ref >60)
Glucose, Bld: 122 mg/dL — ABNORMAL HIGH (ref 70–99)
Potassium: 3.7 mmol/L (ref 3.5–5.1)
Sodium: 139 mmol/L (ref 135–145)

## 2022-11-02 LAB — CBC
HCT: 42.7 % (ref 39.0–52.0)
Hemoglobin: 14.8 g/dL (ref 13.0–17.0)
MCH: 33.6 pg (ref 26.0–34.0)
MCHC: 34.7 g/dL (ref 30.0–36.0)
MCV: 97 fL (ref 80.0–100.0)
Platelets: 185 10*3/uL (ref 150–400)
RBC: 4.4 MIL/uL (ref 4.22–5.81)
RDW: 13.2 % (ref 11.5–15.5)
WBC: 8.8 10*3/uL (ref 4.0–10.5)
nRBC: 0 % (ref 0.0–0.2)

## 2022-11-02 MED ORDER — ORAL CARE MOUTH RINSE: 15.0000 mL | OROMUCOSAL | Status: DC | PRN

## 2022-11-02 MED ORDER — DIPHENHYDRAMINE HCL 25 MG PO CAPS: 25.0000 mg | ORAL_CAPSULE | Freq: Four times a day (QID) | ORAL | Status: DC | PRN

## 2022-11-02 MED ORDER — ENOXAPARIN SODIUM 40 MG/0.4ML IJ SOSY: 40.0000 mg | PREFILLED_SYRINGE | INTRAMUSCULAR | Status: DC

## 2022-11-02 MED ORDER — HEPARIN SODIUM (PORCINE) 1000 UNIT/ML IJ SOLN
INTRAMUSCULAR | Status: AC
  Administered 2022-11-02: 1000 [IU]
  Filled 2022-11-02: qty 1

## 2022-11-02 MED ORDER — INSULIN ASPART 100 UNIT/ML IJ SOLN: 0.0000 [IU] | Freq: Three times a day (TID) | INTRAMUSCULAR | Status: DC

## 2022-11-02 MED ORDER — ACETAMINOPHEN 325 MG PO TABS: 650.0000 mg | ORAL_TABLET | Freq: Four times a day (QID) | ORAL | Status: DC | PRN

## 2022-11-02 MED ORDER — ACETAMINOPHEN 325 MG PO TABS: 650.0000 mg | ORAL_TABLET | ORAL | Status: DC | PRN

## 2022-11-02 MED ORDER — TRAZODONE HCL 50 MG PO TABS: 25.0000 mg | ORAL_TABLET | Freq: Every evening | ORAL | Status: DC | PRN

## 2022-11-02 MED ORDER — GABAPENTIN 300 MG PO CAPS
300.0000 mg | ORAL_CAPSULE | Freq: Three times a day (TID) | ORAL | Status: DC
  Administered 2022-11-02: 300 mg via ORAL
  Filled 2022-11-02: qty 1

## 2022-11-02 MED ORDER — ALUM & MAG HYDROXIDE-SIMETH 200-200-20 MG/5ML PO SUSP: 30.0000 mL | ORAL | Status: DC | PRN

## 2022-11-02 MED ORDER — GABAPENTIN 400 MG PO CAPS
400.0000 mg | ORAL_CAPSULE | Freq: Three times a day (TID) | ORAL | Status: DC
  Administered 2022-11-03 – 2022-11-10 (×22): 400 mg via ORAL
  Filled 2022-11-02 (×23): qty 1

## 2022-11-02 MED ORDER — CALCIUM CARBONATE ANTACID 500 MG PO CHEW
CHEWABLE_TABLET | ORAL | Status: AC
  Administered 2022-11-02: 400 mg
  Filled 2022-11-02: qty 2

## 2022-11-02 MED ORDER — POLYETHYLENE GLYCOL 3350 17 G PO PACK: 17.0000 g | PACK | Freq: Every day | ORAL | Status: DC | PRN

## 2022-11-02 MED ORDER — LOSARTAN POTASSIUM 50 MG PO TABS
25.0000 mg | ORAL_TABLET | Freq: Every day | ORAL | Status: DC
  Administered 2022-11-03: 25 mg via ORAL
  Filled 2022-11-02: qty 1

## 2022-11-02 MED ORDER — CHLORHEXIDINE GLUCONATE CLOTH 2 % EX PADS
6.0000 | MEDICATED_PAD | Freq: Every day | CUTANEOUS | Status: DC
  Administered 2022-11-03: 6 via TOPICAL

## 2022-11-02 MED ORDER — ACD FORMULA A 0.73-2.45-2.2 GM/100ML VI SOLN
1000.0000 mL | Status: DC
  Administered 2022-11-02: 1000 mL

## 2022-11-02 MED ORDER — HEPARIN SODIUM (PORCINE) 1000 UNIT/ML IJ SOLN: 1000.0000 [IU] | Freq: Once | INTRAMUSCULAR | Status: AC

## 2022-11-02 MED ORDER — PROCHLORPERAZINE 25 MG RE SUPP: 12.5000 mg | Freq: Four times a day (QID) | RECTAL | Status: DC | PRN

## 2022-11-02 MED ORDER — ACETAMINOPHEN 325 MG PO TABS
325.0000 mg | ORAL_TABLET | ORAL | Status: DC | PRN
  Administered 2022-11-02 – 2022-11-09 (×7): 650 mg via ORAL
  Filled 2022-11-02 (×7): qty 2

## 2022-11-02 MED ORDER — PROCHLORPERAZINE MALEATE 5 MG PO TABS: 5.0000 mg | ORAL_TABLET | Freq: Four times a day (QID) | ORAL | Status: DC | PRN

## 2022-11-02 MED ORDER — CALCIUM GLUCONATE-NACL 2-0.675 GM/100ML-% IV SOLN
INTRAVENOUS | Status: AC
  Administered 2022-11-02: 2000 mg
  Filled 2022-11-02: qty 100

## 2022-11-02 MED ORDER — CALCIUM GLUCONATE-NACL 2-0.675 GM/100ML-% IV SOLN
2.0000 g | Freq: Once | INTRAVENOUS | Status: DC
  Filled 2022-11-02: qty 100

## 2022-11-02 MED ORDER — SODIUM CHLORIDE 0.9 % IV SOLN
INTRAVENOUS | Status: AC
  Filled 2022-11-02 (×3): qty 200

## 2022-11-02 MED ORDER — PROCHLORPERAZINE EDISYLATE 10 MG/2ML IJ SOLN: 5.0000 mg | Freq: Four times a day (QID) | INTRAMUSCULAR | Status: DC | PRN

## 2022-11-02 MED ORDER — INSULIN ASPART 100 UNIT/ML IJ SOLN: 0.0000 [IU] | Freq: Every day | INTRAMUSCULAR | Status: DC

## 2022-11-02 MED ORDER — ENOXAPARIN SODIUM 40 MG/0.4ML IJ SOSY
40.0000 mg | PREFILLED_SYRINGE | INTRAMUSCULAR | Status: DC
  Administered 2022-11-03 – 2022-11-10 (×8): 40 mg via SUBCUTANEOUS
  Filled 2022-11-02 (×8): qty 0.4

## 2022-11-02 MED ORDER — GUAIFENESIN-DM 100-10 MG/5ML PO SYRP: 5.0000 mL | ORAL_SOLUTION | Freq: Four times a day (QID) | ORAL | Status: DC | PRN

## 2022-11-02 MED ORDER — DIPHENHYDRAMINE HCL 12.5 MG/5ML PO ELIX
12.5000 mg | ORAL_SOLUTION | Freq: Four times a day (QID) | ORAL | Status: DC | PRN
  Administered 2022-11-05: 25 mg via ORAL
  Filled 2022-11-02 (×2): qty 10

## 2022-11-02 MED ORDER — NICOTINE 14 MG/24HR TD PT24
14.0000 mg | MEDICATED_PATCH | Freq: Every day | TRANSDERMAL | Status: DC
  Administered 2022-11-03 – 2022-11-10 (×8): 14 mg via TRANSDERMAL
  Filled 2022-11-02 (×8): qty 1

## 2022-11-02 MED ORDER — DIPHENHYDRAMINE HCL 25 MG PO CAPS
25.0000 mg | ORAL_CAPSULE | Freq: Once | ORAL | Status: AC
  Administered 2022-11-02: 25 mg via ORAL
  Filled 2022-11-02: qty 1

## 2022-11-02 MED ORDER — BISACODYL 10 MG RE SUPP: 10.0000 mg | Freq: Every day | RECTAL | Status: DC | PRN

## 2022-11-02 MED ORDER — FLEET ENEMA 7-19 GM/118ML RE ENEM: 1.0000 | ENEMA | Freq: Once | RECTAL | Status: DC | PRN

## 2022-11-02 NOTE — Progress Notes (Signed)
Inpatient Rehabilitation Admissions Coordinator   I have insurance approval and CIR bed to admit him to today after his last round of PLEX today. I met at bedside and he is aware and in agreement. I contacted Otila Kluver in dialysis, acute team and TOC. I will make the arrangements to admit today.  Danne Baxter, RN, MSN Rehab Admissions Coordinator [phone] 11/02/2022 12:35 PM

## 2022-11-02 NOTE — Progress Notes (Signed)
NEUROLOGY CONSULTATION SERVICE  PROGRESS NOTE   Date of service: November 02, 2022 Patient Name: Joe Greene MRN:  [id number] DOB:  1951/08/02  Brief HPI  Mr. Holtman is a 71 y.o. man was admitted to the hospital with the working diagnosis of acute demyelinating polyneuropathy. 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 He had recurrent and worsening ascending paresthesias, urinary retention, difficulty ambulating, not able to stand and persistent neuropathic pain. Patient was evaluated by neurology as outpatient, and had a nerve conduction study which showed acute inflammatory demyelinating neuropathy. Patient was referred to the hospital for further management. PLEX was started 12/15, planned to be complete 12/22.   Interval Hx   12/22: continues to have some paraesthesias in upper and lower extremities though he reports improvement. He states his paresthesias of his hands are significantly improved "for the most part" and he has waves of increased burning. Patient states that he still feels like his feet "aren't there".  He does state that his breathing has improved. He also has improved coordination and strength today than previously. Patient states that he has made significant improvements and his symptoms have not gotten worse since PLEX. Last PLEX planned for today.   Vitals   Vitals:   11/01/22 2246 11/02/22 0500 11/02/22 0636 11/02/22 0851  BP: 110/65  117/72 125/74  Pulse: 69  63 80  Resp: 18  18 20   Temp: 98.5 F (36.9 C)  97.7 F (36.5 C) 97.6 F (36.4 C)  TempSrc: Oral  Oral Oral  SpO2: 97%  99% 93%  Weight:  90.5 kg    Height:         Body mass index is 28.63 kg/m.  Physical Exam   General: Sitting up comfortably in bed; in no acute distress. Wife is at bedside.  HENT: Normal oropharynx and mucosa. Normal external appearance of ears and nose.  Neck: Supple, no  pain or tenderness  CV: No JVD. No peripheral edema.  Pulmonary: Symmetric Chest rise. Normal respiratory effort.  Abdomen: Soft to touch, non-tender.  Ext: No cyanosis, edema, or deformity  Skin: No rash. Normal palpation of skin.   Musculoskeletal: Normal digits and nails by inspection. No clubbing.   Neurologic Examination  Mental status/Cognition: Alert, oriented to self, place, month and year, good attention.  Speech/language: Fluent, comprehension intact, object naming intact, repetition intact.  Cranial nerves:  Pupils equal and reacitve. NO VF defecits, glasses at baseline. EOM intact, no nystagmus. Face symmetric. Hearing intact to voce. Shoulder shrug symmetrical. Midline tongue protrusion  Motor:  Normal bulk and tone.  Decreased fine motor movements bilateral hands, though improved from previous exam. 5/5 arm movement. 5/5 knee flexion and extension, 4-/5 hip flexion bilaterally. Slowed RAM of bilateral feet. 5/5 neck flexion and extension.    Sensation: Decreased sensation and parathesia to bilateral hands and fingers and to bilateral legs up to the pelvic area. Reports he can feel it, but "it doesn't feel right. Like when you're at the dentist"  Coordination/Complex Motor:  - Finger to Nose: Intact with mild right upper extremity tremor - Heel to shin: ataxia noted in bilateral lower extremities  - Rapid alternating movement: Intact, slowed throughout  Gait: deferred Single breath test: can count to 15  Labs   Basic Metabolic Panel:  Lab Results  Component Value Date   NA 139 11/02/2022   K 3.7 11/02/2022   CO2  22 11/02/2022   GLUCOSE 122 (H) 11/02/2022   BUN 17 11/02/2022   CREATININE 0.96 11/02/2022   CALCIUM 9.7 11/02/2022   GFRNONAA >60 11/02/2022   HbA1c:  Lab Results  Component Value Date   HGBA1C 6.5 09/24/2022   LDL:  Lab Results  Component Value Date   LDLCALC UNABLE TO CALCULATE IF TRIGLYCERIDE OVER 400 mg/dL 29/56/2130   Urine Drug  Screen:     Component Value Date/Time   LABOPIA NONE DETECTED 09/24/2022 1722   COCAINSCRNUR NONE DETECTED 09/24/2022 1722   LABBENZ NONE DETECTED 09/24/2022 1722   AMPHETMU NONE DETECTED 09/24/2022 1722   THCU NONE DETECTED 09/24/2022 1722   LABBARB NONE DETECTED 09/24/2022 1722    Alcohol Level No results found for: "ETH" No results found for: "PHENYTOIN", "ZONISAMIDE", "LAMOTRIGINE", "LEVETIRACETA" No results found for: "PHENYTOIN", "PHENOBARB", "VALPROATE", "CBMZ"  Imaging and Diagnostic studies  Results for orders placed during the hospital encounter of 09/24/22  MRI BRAIN and MR ANGIO HEAD WO CONTRAST  IMPRESSION: 1. No acute brain finding. Mild chronic small-vessel ischemic change of the pons and cerebral hemispheric white matter. 2. Negative intracranial MR angiography of the large and medium size vessels. 3. Left mastoid effusion.  Impression  Joe Greene is a 71 y.o. male with PMH significant for Guillian-Barre syndrome in Nov with near resolution after course of IVIg. Readmitted with further paresthesia in hands bilaterally, has progressed up to pelvic area in his bilateral lowers. No incontinence reported. Patient reports large margin improvement in strength and paraesthesias since admission, planned for CIR discharge following PLEX completion.     # Acute inflammatory demyelinating polyneuropathy 12/15 Plasma exchange (1st), NIF greater than -40 and VC 2,7 L.  12/18: NIF: > -40, VC: 1.3L today Plan to complete PLEX today 12/22 with improvement in strength and paraesthesias and strength  Gabapentin for pain PRN Zofran for nausea pre PLEX as needed Will be discharged to CIR following PLEX completion    # Hypertension  Mgmt per primary team  Recommendations  PLEX treatments will be complete today Continue gabapentin for neuropathic pain Patient to CIR following PLEX completion Follow up with outpatient neurologist, Dr. Terrace Arabia   ______________________________________________________________________  Lanae Boast, AGACNP-BC [facility] Pager: [phone]   NEUROHOSPITALIST ADDENDUM Performed a face to face diagnostic evaluation.   I have reviewed the contents of history and physical exam as documented by PA/ARNP/Resident and agree with above documentation.  I have discussed and formulated the above plan as documented. Edits to the note have been made as needed.  Erick Blinks, MD [facility] [phone]   If 7pm to 7am, please call on call as listed on AMION

## 2022-11-02 NOTE — Progress Notes (Signed)
Physical Therapy Treatment Patient Details Name: Joe Greene MRN: [id number] DOB: 1951-06-08 Today's Date: 11/02/2022   History of Present Illness Pt is a 71 y.o. M who presents 10/24/2022 with Acute inflammatory demyelinating polyneuropathy. 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    11/02/22 1600  Standardized Balance Assessment  Standardized Balance Assessment  Berg Balance Test  Berg Balance Test  Sit to Stand 2  Standing Unsupported 2  Sitting with Back Unsupported but Feet Supported on Floor or Stool 4  Stand to Sit 2  Transfers 1  Standing Unsupported with Eyes Closed 2  Standing Ubsupported with Feet Together 1  From Standing, Reach Forward with Outstretched Arm 0  From Standing Position, Pick up Object from Floor 0  From Standing Position, Turn to Look Behind Over each Shoulder 1  Turn 360 Degrees 0  Standing Unsupported, Alternately Place Feet on Step/Stool 1  Standing Unsupported, One Foot in Front 1  Standing on One Leg 1  Total Score 18      Recommendations for follow up therapy are one component of a multi-disciplinary discharge planning process, led by the attending physician.  Recommendations may be updated based on patient status, additional functional criteria and insurance authorization.  Follow Up Recommendations  Acute inpatient rehab (3hours/day)     Assistance Recommended at Discharge Frequent or constant Supervision/Assistance  Patient can return home with the following Two people to help with walking and/or transfers;A lot of help with bathing/dressing/bathroom;Assistance with  cooking/housework;Help with stairs or ramp for entrance;Assist for transportation   Equipment Recommendations  None recommended by PT    Recommendations for Other Services Rehab consult     Precautions / Restrictions Precautions Precautions: Fall Restrictions Weight Bearing Restrictions: No     Mobility  Bed Mobility Overal bed mobility: Needs Assistance Bed Mobility: Supine to Sit     Supine to sit: Supervision     General bed mobility comments: increased time and effort    Transfers Overall transfer level: Needs assistance Equipment used: Rolling walker (2 wheels) Transfers: Sit to/from Stand Sit to Stand: Min guard           General transfer comment: Min guard A for safety    Ambulation/Gait Ambulation/Gait assistance: Mod assist, Min assist Gait Distance (Feet): 50 Feet (multiple bouts) Assistive device: Rolling walker (2 wheels) Gait Pattern/deviations: Step-through pattern, Decreased stride length, Knees buckling, Wide base of support       General Gait Details: Progressed with forward gait and turns, navigating 50 feet x2 with seated rest break.Mod assist for balancex 3 instances, LEs buckling but demonstrates good UE strength to support himself. Cues for sequencing and walker placement with turns. noting tendency for knees to push bck into hyperextension for stance stability; R ankle rolled once during today's walk   Stairs             Wheelchair Mobility    Modified Rankin (Stroke Patients Only)       Balance                                 Standardized  Balance Assessment Standardized Balance Assessment : Berg Balance Test Berg Balance Test Sit to Stand: Able to stand using hands after several tries Standing Unsupported: Able to stand 30 seconds unsupported Sitting with Back Unsupported but Feet Supported on Floor or Stool: Able to sit safely and securely 2 minutes Stand to Sit: Uses backs of legs against chair to  control descent Transfers: Needs one person to assist Standing Unsupported with Eyes Closed: Able to stand 3 seconds Standing Ubsupported with Feet Together: Needs help to attain position but able to stand for 30 seconds with feet together From Standing, Reach Forward with Outstretched Arm: Loses balance while trying/requires external support From Standing Position, Pick up Object from Floor: Unable to try/needs assist to keep balance From Standing Position, Turn to Look Behind Over each Shoulder: Needs supervision when turning Turn 360 Degrees: Needs assistance while turning Standing Unsupported, Alternately Place Feet on Step/Stool: Able to complete >2 steps/needs minimal assist Standing Unsupported, One Foot in Front: Needs help to step but can hold 15 seconds Standing on One Leg: Tries to lift leg/unable to hold 3 seconds but remains standing independently Total Score: 18        Cognition Arousal/Alertness: Awake/alert Behavior During Therapy: WFL for tasks assessed/performed Overall Cognitive Status: Within Functional Limits for tasks assessed                                          Exercises      General Comments        Pertinent Vitals/Pain Pain Assessment Pain Assessment: Faces Faces Pain Scale: Hurts a little bit Pain Location: BLE after functional mobility Pain Descriptors / Indicators: Discomfort, Pins and needles, Numbness Pain Intervention(s): Limited activity within patient's tolerance    Home Living                          Prior Function            PT Goals (current goals can now be found in the care plan section) Acute Rehab PT Goals Patient Stated Goal: Improve symptoms, return to PLOF PT Goal Formulation: With patient Time For Goal Achievement: 11/11/22 Potential to Achieve Goals: Good Progress towards PT goals: Progressing toward goals    Frequency    Min 4X/week      PT Plan Current plan remains appropriate     Co-evaluation              AM-PAC PT "6 Clicks" Mobility   Outcome Measure  Help needed turning from your back to your side while in a flat bed without using bedrails?: None Help needed moving from lying on your back to sitting on the side of a flat bed without using bedrails?: A Little Help needed moving to and from a bed to a chair (including a wheelchair)?: A Little Help needed standing up from a chair using your arms (e.g., wheelchair or bedside chair)?: A Little Help needed to walk in hospital room?: A Lot Help needed climbing 3-5 steps with a railing? : Total 6 Click Score: 16    End of Session Equipment Utilized During Treatment: Gait belt Activity Tolerance: Patient tolerated treatment well Patient left: in chair;with call bell/phone within reach Nurse Communication: Mobility status PT Visit Diagnosis: Unsteadiness on feet (R26.81);Other abnormalities of gait and mobility (R26.89);Difficulty in walking, not elsewhere classified (R26.2);Other symptoms and signs  involving the nervous system (R29.898)     Time: 8177-1165 PT Time Calculation (min) (ACUTE ONLY): 26 min  Charges:  $Gait Training: 8-22 mins $Therapeutic Activity: 8-22 mins                     Van Clines, PT  Acute Rehabilitation Services Office [phone]    [name] 11/02/2022, 4:47 PM

## 2022-11-02 NOTE — TOC Transition Note (Signed)
Transition of Care [facility]) - CM/SW Discharge Note   Patient Details  Name: Joe Greene MRN: [id number] Date of Birth: 02-14-1951  [facility] CM/SW Contact:  Tom-Johnson, Hershal Coria, RN Phone Number: 11/02/2022, 1:55 PM   Clinical Narrative:     Patient is scheduled for discharge to CIR today. Patient will be transported via bed as it's in-hospital transfer. Denies amy other needs. No further TOC needs noted.        Final next level of care: IP Rehab Facility Barriers to Discharge: Barriers Resolved   Patient Goals and CMS Choice CMS Medicare.gov Compare Post Acute Care list provided to:: Patient Choice offered to / list presented to : Patient, Spouse  Discharge Placement                  Patient to be transferred to facility by: Bed- In-hospital transfer      Discharge Plan and Services Additional resources added to the After Visit Summary for     Discharge Planning Services: CM Consult Post Acute Care Choice: IP Rehab                    HH Arranged: NA HH Agency: NA        Social Determinants of Health (SDOH) Interventions SDOH Screenings   Food Insecurity: No Food Insecurity (10/26/2022)  Housing: Low Risk  (10/26/2022)  Transportation Needs: No Transportation Needs (10/26/2022)  Utilities: Not At Risk (10/26/2022)  Alcohol Screen: Low Risk  (11/14/2021)  Depression (PHQ2-9): Low Risk  (11/14/2021)  Financial Resource Strain: Low Risk  (11/14/2021)  Physical Activity: Sufficiently Active (11/14/2021)  Social Connections: Moderately Integrated (11/14/2021)  Stress: No Stress Concern Present (11/14/2021)  Tobacco Use: Medium Risk (10/25/2022)     Readmission Risk Interventions     No data to display

## 2022-11-02 NOTE — Progress Notes (Signed)
Inpatient Rehabilitation Admissions Coordinator   I await [employer] determination for  a possible Cir admit. I met at bedside with patient and wife to review cost of care of CIR if approved. They are in agreement.  Danne Baxter, RN, MSN Rehab Admissions Coordinator [phone] 11/02/2022 11:21 AM

## 2022-11-02 NOTE — Progress Notes (Signed)
Inpatient Rehabilitation Admission Medication Review by a Pharmacist  A complete drug regimen review was completed for this patient to identify any potential clinically significant medication issues.  High Risk Drug Classes Is patient taking? Indication by Medication  Antipsychotic Yes Prochlorperazine - as needed for nausea  Anticoagulant Yes Enoxaparin for VTE ppx  Antibiotic No   Opioid No   Antiplatelet No   Hypoglycemics/insulin Yes Sliding Scale insulin - DM  Vasoactive Medication Yes Losartan - HTN  Chemotherapy No   Other No      Type of Medication Issue Identified Description of Issue Recommendation(s)  Drug Interaction(s) (clinically significant)     Duplicate Therapy     Allergy     No Medication Administration End Date     Incorrect Dose     Additional Drug Therapy Needed     Significant med changes from prior encounter (inform family/care partners about these prior to discharge).    Other       Clinically significant medication issues were identified that warrant physician communication and completion of prescribed/recommended actions by midnight of the next day:  No  Name of provider notified for urgent issues identified:   Provider Method of Notification:     Pharmacist comments:   Time spent performing this drug regimen review (minutes):  20   Paulina Muchmore, Pharm.D., BCPS Clinical Pharmacist  11/02/2022 9:50 PM

## 2022-11-02 NOTE — Progress Notes (Signed)
Jennye Boroughs, MD  Physician Physical Medicine and Rehabilitation   PMR Pre-admission    Signed   Date of Service: 11/01/2022  1:43 PM  Related encounter: ED to Hosp-Admission (Discharged) from 10/24/2022 in [place]      Show:Clear all _0 Written_1 Templated_2 Copied  Added by: _3 Cristina Gong, RN_4 Jennye Boroughs, MD  _5 Hover for details PMR Admission Coordinator Pre-Admission Assessment   Patient: Joe Greene is an 71 y.o., male MRN: [id number] DOB: 01/30/1951 Height: _6  (177.8 cm) Weight: 90.5 kg   Insurance Information HMO:     PPO: yes     PCP:      IPA:      80/20:      OTHER:  PRIMARY: [employer]      Policy#: [id number]      Subscriber: pt CM Name: Dawson Bills   Phone#: [phone] ext [phone]     Fax#: [phone] Pre-Cert#: [id number] approved for 7 days. F/u with Edwena Felty at ext [phone] fax [phone]     Employer:  Benefits:  Phone #: [phone]     Name: 12/21 Eff. Date: 11/13/19     Deduct: none      Out of Pocket Max: $4000       CIR: $160 co pay per day days 1 until 10      SNF: no c co pay days 1 until 20; $50 c copay per day days 21 until 100 Outpatient: $20 per visit     Co-Pay: vitis per medical neccesity Home Health: 100%      Co-Pay: visits per medical neccesity DME: 80%     Co-Pay: 20% Providers: in network  SECONDARY: none   Financial Counselor:       Phone#:    The Engineer, [employer]" for patients in Inpatient Rehabilitation Facilities with attached "Privacy Act [place] Records" was provided and verbally reviewed with: Patient   Emergency Contact Information Contact Information       Name Relation Home Work Mobile    [place] Spouse     [phone]         Current Medical History  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 Presented on 10/24/22 with worsening weakness. Neurology consulted.   Neurology recommended plasma exchange for 5 days, first session on 12/15. And ending 12/22. Close monitoring of NIF q 12 hours.Continue losartan for HTN. Lovenox for DVT prophylaxis.    Patient's medical record from [facility] has been reviewed by the rehabilitation admission coordinator and physician.   Past Medical History      Past Medical History:  Diagnosis Date   AAA (abdominal aortic aneurysm) ([place])     Cataract      forming     Has the patient had major surgery during 100 days prior to admission? No   Family History   family history includes AAA (abdominal aortic aneurysm) in his mother; Cancer in his father; Stroke (age of onset: 59) in his mother.   Current Medications   Current Facility-Administered Medications:    acetaminophen (TYLENOL) tablet 650 mg, 650 mg, Oral, Q6H PRN, 650 mg at 11/01/22 0946 **OR** acetaminophen (TYLENOL) suppository 650 mg, 650 mg, Rectal, Q6H PRN, Opyd, Timothy S, MD   albumin human 25 % 50 g in sodium chloride 0.9 %, , Dialysis, Q1 Hr x 3,  Donnetta Simpers, MD   calcium carbonate (TUMS - dosed in mg elemental calcium) 500 MG chewable tablet, , , ,    calcium gluconate 2 g/ 100 mL sodium chloride IVPB, 2 g, Intravenous, Once, Donnetta Simpers, MD   calcium gluconate 2-0.675 GM/100ML-% IVPB, , , ,    Chlorhexidine Gluconate Cloth 2 % PADS 6 each, 6 each, Topical, Daily, Arrien, Jimmy Picket, MD, 6 each at 11/01/22 1000   citrate dextrose (ACD-A anticoagulant) solution 1,000 mL, 1,000 mL, Other, Continuous, Lorrin Goodell, Salman, MD   enoxaparin (LOVENOX) injection 40 mg, 40 mg, Subcutaneous, Daily, Opyd, Ilene Qua, MD, 40 mg at 11/02/22 0908   gabapentin (NEURONTIN) capsule 300 mg, 300 mg, Oral, TID, Opyd, Ilene Qua, MD, 300 mg at 11/02/22 3354   heparin sodium (porcine) 1000 UNIT/ML injection, , , ,    heparin  sodium (porcine) injection 1,000 Units, 1,000 Units, Intracatheter, Once, Donnetta Simpers, MD   losartan (COZAAR) tablet 25 mg, 25 mg, Oral, Daily, Opyd, Timothy S, MD, 25 mg at 11/01/22 1245   nicotine (NICODERM CQ - dosed in mg/24 hours) patch 14 mg, 14 mg, Transdermal, Daily, Arrien, Jimmy Picket, MD, 14 mg at 11/02/22 0908   ondansetron (ZOFRAN) tablet 4 mg, 4 mg, Oral, Q6H PRN, 4 mg at 11/01/22 0946 **OR** ondansetron (ZOFRAN) injection 4 mg, 4 mg, Intravenous, Q6H PRN, Opyd, Ilene Qua, MD, 4 mg at 10/26/22 1955   Oral care mouth rinse, 15 mL, Mouth Rinse, PRN, Mariel Aloe, MD   polyethylene glycol (MIRALAX / GLYCOLAX) packet 17 g, 17 g, Oral, Daily PRN, Opyd, Ilene Qua, MD, 17 g at 10/30/22 1738   Patients Current Diet:  Diet Order                  Diet regular Room service appropriate? Yes; Fluid consistency: Thin  Diet effective now                       Precautions / Restrictions Precautions Precautions: Fall Restrictions Weight Bearing Restrictions: No    Has the patient had 2 or more falls or a fall with injury in the past year? No   Prior Activity Level Community (5-7x/wk): Independent   Prior Functional Level Self Care: Did the patient need help bathing, dressing, using the toilet or eating? Independent   Indoor Mobility: Did the patient need assistance with walking from room to room (with or without device)? Independent   Stairs: Did the patient need assistance with internal or external stairs (with or without device)? Independent   Functional Cognition: Did the patient need help planning regular tasks such as shopping or remembering to take medications? Independent   Patient Information Are you of Hispanic, Latino/a,or Spanish origin?: A. No, not of Hispanic, Latino/a, or Spanish origin What is your race?: A. White Do you need or want an interpreter to communicate with a doctor or health care staff?: 0. No   Patient's Response To:  Health Literacy  and Transportation Is the patient able to respond to health literacy and transportation needs?: Yes Health Literacy - How often do you need to have someone help you when you read instructions, pamphlets, or other written material from your doctor or pharmacy?: Never In the past 12 months, has lack of transportation kept you from medical appointments or from getting medications?: No In the past 12 months, has lack of transportation kept you from meetings, work, or from getting things needed for daily living?: No   Home  Assistive Devices / Equipment Home Assistive Devices/Equipment: Environmental consultant (specify type) (front roller) Home Equipment: Grab bars - tub/shower, Shower seat, Conservation officer, nature (2 wheels), Wheelchair - manual   Prior Device Use: Indicate devices/aids used by the patient prior to current illness, exacerbation or injury? None of the above   Current Functional Level Cognition   Overall Cognitive Status: Within Functional Limits for tasks assessed Orientation Level: Oriented X4 General Comments: Able to communicate all areas in which he is experiencing numbness as well as how it has/hasnl;'t changed since day prior.    Extremity Assessment (includes Sensation/Coordination)   Upper Extremity Assessment: Generalized weakness, RUE deficits/detail, LUE deficits/detail RUE Deficits / Details: reports numbness and decreased dexterity, also reprots able to complete ADLs with + time RUE Sensation: decreased light touch, decreased proprioception RUE Coordination: decreased fine motor, decreased gross motor LUE Deficits / Details: reports numbness and decreased dexterity, also reprots able to complete ADLs with + time LUE Sensation: decreased light touch, decreased proprioception LUE Coordination: decreased fine motor, decreased gross motor  Lower Extremity Assessment: Defer to PT evaluation RLE Deficits / Details: Rt hip flexion 4-/5, abduction 4/5, knee extension 4/5, dorsiflexion 4/5, hallux  extension 4/5. RLE Sensation: decreased light touch, decreased proprioception LLE Deficits / Details: Lt hip flexion 4-/5, abduction 4-/5, knee extension 4/5, dorsiflexion 4-/5, hallux extension 4/5. LLE Sensation: decreased light touch, decreased proprioception     ADLs   Overall ADL's : Needs assistance/impaired Eating/Feeding: Set up, Sitting Eating/Feeding Details (indicate cue type and reason): Pt self feeding when initially entering room with min difficulty. limited spillage and no assist needed. Pt not interested in built up grip Grooming: Standing, Oral care, Minimal assistance, Min guard Grooming Details (indicate cue type and reason): Min guard-min A for balance when using BUE Upper Body Bathing: Set up, Sitting Lower Body Bathing: Min guard, Sitting/lateral leans Lower Body Bathing Details (indicate cue type and reason): simulated, able to figure 4 Upper Body Dressing : Set up, Sitting Upper Body Dressing Details (indicate cue type and reason): donning gown on back side. Pt buttoning buttons on gown with increased time and reliance on vision to line up buttons. Lower Body Dressing: Sitting/lateral leans, Supervision/safety Lower Body Dressing Details (indicate cue type and reason): Adusting socks supine, but able to perform figure 4 to don socks sitting EOB with supervision Toilet Transfer: Minimal assistance, Rolling walker (2 wheels), Ambulation Toilet Transfer Details (indicate cue type and reason): Min A for balance during functional mobility into restroom. Pt reports his L foot feels like it is dragging, however, not observed to be actively dragging floor Toileting- Clothing Manipulation and Hygiene: Min guard, Sitting/lateral lean Toileting - Clothing Manipulation Details (indicate cue type and reason): Wife holding urinal to use in standing position. Functional mobility during ADLs: Minimal assistance, Rolling walker (2 wheels) (OT pulling chair behind pt for rest break if  needed, but pt denying need for rest break. Mild instability of R knee at times, and pt continues to complain of decreased feeling in feet;) General ADL Comments: ADl participation impacted by decreased activity tolerance, impaired balance. decreased BUE FMC and generalized deconditioning     Mobility   Overal bed mobility: Needs Assistance Bed Mobility: Supine to Sit Supine to sit: HOB elevated, Min guard Sit to supine: Min guard, HOB elevated General bed mobility comments: increased time and effort     Transfers   Overall transfer level: Needs assistance Equipment used: Rolling walker (2 wheels) Transfers: Sit to/from Stand Sit to Stand: Min guard  General transfer comment: Min guard A for safety     Ambulation / Gait / Stairs / Wheelchair Mobility   Ambulation/Gait Ambulation/Gait assistance: Mod assist, Min assist Gait Distance (Feet): 50 Feet (x multiple bouts) Assistive device: Rolling walker (2 wheels) Gait Pattern/deviations: Step-through pattern, Decreased stride length, Knees buckling, Wide base of support General Gait Details: Progressed with forward gait and turns, navigating 50 feet x2 with seated rest break.Mod assist for balancex 3 instances, LEs buckling but demonstrates good UE strength to support himself. Cues for sequencing and walker placement with turns. noting tendency for knees to push bck into hyperextension for stance stability; R ankle rolled once during today's walk Gait velocity: decreased Gait velocity interpretation: <1.31 ft/sec, indicative of household ambulator Pre-gait activities: Tolerated several pre-gait activities focusing on weight shift, march, and balance at EOB prior to walking.     Posture / Balance Balance Overall balance assessment: Needs assistance Sitting-balance support: No upper extremity supported, Feet supported Sitting balance-Leahy Scale: Fair Standing balance support: Bilateral upper extremity supported, During functional  activity Standing balance-Leahy Scale: Poor Standing balance comment: MIn A when performing static standing tasks with BUE (thus, no UE support)     Special needs/care consideration Fall precautions    Previous Home Environment  Living Arrangements: Spouse/significant other  Lives With: Spouse Available Help at Discharge: Family, Available 24 hours/day Type of Home: House Home Layout: Able to live on main level with bedroom/bathroom Home Access: Ramped entrance Bathroom Shower/Tub: Multimedia programmer: Associate Professor Accessibility: Yes Home Care Services: No Additional Comments: DME was wife's after prior hospitalization   Discharge Living Setting Plans for Discharge Living Setting: Patient's home, Lives with (comment) (wife) Type of Home at Discharge: House Discharge Home Layout: One level, Able to live on main level with bedroom/bathroom Discharge Home Access: [place] entrance Discharge Bathroom Shower/Tub: Walk-in shower Discharge Bathroom Toilet: Standard Discharge Bathroom Accessibility: Yes How Accessible: Accessible via walker Does the patient have any problems obtaining your medications?: No   Social/Family/Support Systems Patient Roles: Spouse Contact Information: [name], wife Anticipated Caregiver: wife Anticipated Ambulance person Information: see contacts Ability/Limitations of Caregiver: no limitations Caregiver Availability: 24/7 Discharge Plan Discussed with Primary Caregiver: Yes Is Caregiver In Agreement with Plan?: Yes Does Caregiver/Family have Issues with Lodging/Transportation while Pt is in Rehab?: No   Goals Patient/Family Goal for Rehab: Mod I to supervision with PT and OT Expected length of stay: ELOS 7 to 10 days Pt/Family Agrees to Admission and willing to participate: Yes Program Orientation Provided & Reviewed with Pt/Caregiver Including Roles  & Responsibilities: Yes   Decrease burden of Care through IP rehab admission:  n/a   Possible need for SNF placement upon discharge: not anticipated   Patient Condition: I have reviewed medical records from [facility], spoken with patient. I met with patient at the bedside for inpatient rehabilitation assessment.  Patient will benefit from ongoing PT and OT, can actively participate in 3 hours of therapy a day 5 days of the week, and can make measurable gains during the admission.  Patient will also benefit from the coordinated team approach during an Inpatient Acute Rehabilitation admission.  The patient will receive intensive therapy as well as Rehabilitation physician, nursing, social worker, and care management interventions.  Due to bladder management, bowel management, safety, skin/wound care, disease management, medication administration, pain management, and patient education the patient requires 24 hour a day rehabilitation nursing.  The patient is currently min assist overall with mobility and basic ADLs.  Discharge setting and therapy post discharge at home with outpatient is anticipated.  Patient has agreed to participate in the Acute Inpatient Rehabilitation Program and will admit today.   Preadmission Screen Completed By:  Cleatrice Burke, 11/02/2022 12:39 PM ______________________________________________________________________   Discussed status with Dr. Curlene Dolphin on 11/02/22 at 1239 and received approval for admission today.   Admission Coordinator:  Cleatrice Burke, RN, time 1239 Date 11/02/22    Assessment/Plan: Diagnosis: AIDP Does the need for close, 24 hr/day Medical supervision in concert with the patient's rehab needs make it unreasonable for this patient to be served in a less intensive setting? Yes Co-Morbidities requiring supervision/potential complications: HTN, AAA, neuropathy, emphysema, iron deficiency Due to bladder management, bowel management, safety, skin/wound care, disease management, medication administration,  pain management, and patient education, does the patient require 24 hr/day rehab nursing? Yes Does the patient require coordinated care of a physician, rehab nurse, PT, OT, and SLP to address physical and functional deficits in the context of the above medical diagnosis(es)? Yes Addressing deficits in the following areas: balance, endurance, locomotion, strength, transferring, bowel/bladder control, bathing, dressing, feeding, grooming, toileting, and psychosocial support Can the patient actively participate in an intensive therapy program of at least 3 hrs of therapy 5 days a week? Yes The potential for patient to make measurable gains while on inpatient rehab is excellent Anticipated functional outcomes upon discharge from inpatient rehab: modified independent and supervision PT, modified independent and supervision OT, n/a SLP Estimated rehab length of stay to reach the above functional goals is: 7-10 Anticipated discharge destination: Home 10. Overall Rehab/Functional Prognosis: excellent     MD Signature: Jennye Boroughs         Revision History

## 2022-11-02 NOTE — Progress Notes (Signed)
2200 blood glucose finger stick was taken by Corrie Dandy, NT and she stated that it was 179.

## 2022-11-02 NOTE — H&P (Signed)
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 He was treated with IVIG X 5 days and d/c to home walking without assistance. He started developing weakness 11/27 with sensory deficits from waist down and difficulty walking. He was evaluated by Dr. 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 Zannie Cove office for treatment. Central line placed on 12/14 and he was started on IVIG with improvement in symptoms. He completes 5/5 exchange today and reports improvement in sensory deficits--now has numbness in feet and bilateral palms. NIF-32 and vital capacity 2.7L.  Neurology does not plan for further plasma exchange, plan for catheter removal. PT/OT has been working with patient who continues to be limited by neuropathy with sensory deficits, balance deficits and decrease in BUE [facility]. CIR recommended due to functional decline.      Review of Systems  Constitutional:  Negative for fever.  HENT:  Negative for hearing loss.   Eyes:  Negative for blurred vision and double vision.  Respiratory:  Negative for cough and hemoptysis.   Cardiovascular:  Negative for chest pain and palpitations.  Gastrointestinal:  Positive for heartburn. Negative for constipation and nausea.  Genitourinary:  Positive for urgency (due to sensory deficits--doesn't know if he's done). Negative for dysuria.  Musculoskeletal:  Negative for joint pain.  Skin:  Negative for rash.  Neurological:  Positive for tingling, sensory change and weakness.  Psychiatric/Behavioral:  The patient has insomnia (gets up at 3 am daily to urinate).             Past  Medical History:  Diagnosis Date   AAA (abdominal aortic aneurysm) (HCC)     Cataract      forming            Past Surgical History:  Procedure Laterality Date   FOOT SURGERY Right     HERNIA REPAIR Right     IR FLUORO GUIDE CV LINE RIGHT   10/25/2022   IR US GUIDE VASC ACCESS RIGHT   10/25/2022   KNEE ARTHROSCOPY Left     WISDOM TOOTH EXTRACTION               Family History  Problem Relation Age of Onset   Stroke Mother 32   AAA (abdominal aortic aneurysm) Mother     Cancer Father          Prostate   Colon cancer Neg Hx     Colon polyps Neg Hx     Esophageal cancer Neg Hx     Rectal cancer Neg Hx     Stomach cancer Neg Hx        Social History: Married. 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 He reports that he quit smoking about 6 weeks ago. His smoking use included cigarettes--2PPD. He has a 100.00 pack-year smoking history. He has never used smokeless tobacco. He reports current alcohol use of about 2 shots of liquor per week.  He reports that he does not use drugs.     Allergies: No Known Allergies  Medications Prior to Admission  Medication Sig Dispense Refill   aspirin EC 81 MG tablet Take 81 mg by mouth daily. Swallow whole.       docusate sodium (COLACE) 50 MG capsule Take 150 mg by mouth daily.       ferrous sulfate 325 (65 FE) MG EC tablet Take 325 mg by mouth daily. When able to remember       gabapentin (NEURONTIN) 300 MG capsule Take 1 capsule (300 mg total) by mouth 3 (three) times daily. 90 capsule 11   ibuprofen (ADVIL) 200 MG tablet Take 400 mg by mouth 3 (three) times daily as needed for headache or mild pain.       losartan (COZAAR) 25 MG tablet Take 1 tablet (25 mg total) by mouth daily. 30 tablet 1   Multiple Vitamin (MULTIVITAMIN WITH MINERALS) TABS tablet Take 1 tablet by mouth daily. 30 tablet 1   nicotine (NICODERM CQ - DOSED IN MG/24 HOURS) 21 mg/24hr patch Place 1 patch (21 mg total) onto the skin daily. 28 patch 0   Omega-3  Fatty Acids (FISH OIL PO) Take 1 capsule by mouth daily. When able to remember       sildenafil (VIAGRA) 100 MG tablet Take 0.5-1 tablets (50-100 mg total) by mouth daily as needed for erectile dysfunction. (Patient not taking: Reported on 10/25/2022) 5 tablet 11          Home: Home Living Family/patient expects to be discharged to:: Private residence Living Arrangements: Spouse/significant other Available Help at Discharge: Family, Available 24 hours/day Type of Home: House Home Access: Ramped entrance Home Layout: Able to live on main level with bedroom/bathroom Bathroom Shower/Tub: Health visitor: Standard Bathroom Accessibility: Yes Home Equipment: Grab bars - tub/shower, Shower seat, Agricultural consultant (2 wheels), Wheelchair - manual Additional Comments: DME was wife's after prior hospitalization  Lives With: Spouse   Functional History: Prior Function Prior Level of Function : Independent/Modified Independent Mobility Comments: Manages farm; provides maintenance ADLs Comments: Indep   Functional Status:  Mobility: Bed Mobility Overal bed mobility: Needs Assistance Bed Mobility: Supine to Sit Supine to sit: HOB elevated, Min guard Sit to supine: Min guard, HOB elevated General bed mobility comments: increased time and effort Transfers Overall transfer level: Needs assistance Equipment used: Rolling walker (2 wheels) Transfers: Sit to/from Stand Sit to Stand: Min guard General transfer comment: Min guard A for safety Ambulation/Gait Ambulation/Gait assistance: Mod assist, Min assist Gait Distance (Feet): 50 Feet (x multiple bouts) Assistive device: Rolling walker (2 wheels) Gait Pattern/deviations: Step-through pattern, Decreased stride length, Knees buckling, Wide base of support General Gait Details: Progressed with forward gait and turns, navigating 50 feet x2 with seated rest break.Mod assist for balancex 3 instances, LEs buckling but demonstrates  good UE strength to support himself. Cues for sequencing and walker placement with turns. noting tendency for knees to push bck into hyperextension for stance stability; R ankle rolled once during today's walk Gait velocity: decreased Gait velocity interpretation: <1.31 ft/sec, indicative of household ambulator Pre-gait activities: Tolerated several pre-gait activities focusing on weight shift, march, and balance at EOB prior to walking.   ADL: ADL Overall ADL's : Needs assistance/impaired Eating/Feeding: Set up, Sitting Eating/Feeding Details (indicate cue type and reason): Pt self feeding when initially entering room with min difficulty. limited spillage and no assist needed. Pt not interested in built up grip Grooming: Standing, Oral care, Minimal assistance, Min guard Grooming Details (indicate cue type and reason): Min guard-min A for balance  when using BUE Upper Body Bathing: Set up, Sitting Lower Body Bathing: Min guard, Sitting/lateral leans Lower Body Bathing Details (indicate cue type and reason): simulated, able to figure 4 Upper Body Dressing : Set up, Sitting Upper Body Dressing Details (indicate cue type and reason): donning gown on back side. Pt buttoning buttons on gown with increased time and reliance on vision to line up buttons. Lower Body Dressing: Sitting/lateral leans, Supervision/safety Lower Body Dressing Details (indicate cue type and reason): Adusting socks supine, but able to perform figure 4 to don socks sitting EOB with supervision Toilet Transfer: Minimal assistance, Rolling walker (2 wheels), Ambulation Toilet Transfer Details (indicate cue type and reason): Min A for balance during functional mobility into restroom. Pt reports his L foot feels like it is dragging, however, not observed to be actively dragging floor Toileting- Clothing Manipulation and Hygiene: Min guard, Sitting/lateral lean Toileting - Clothing Manipulation Details (indicate cue type and  reason): Wife holding urinal to use in standing position. Functional mobility during ADLs: Minimal assistance, Rolling walker (2 wheels) (OT pulling chair behind pt for rest break if needed, but pt denying need for rest break. Mild instability of R knee at times, and pt continues to complain of decreased feeling in feet;) General ADL Comments: ADl participation impacted by decreased activity tolerance, impaired balance. decreased BUE FMC and generalized deconditioning   Cognition: Cognition Overall Cognitive Status: Within Functional Limits for tasks assessed Orientation Level: Oriented X4 Cognition Arousal/Alertness: Awake/alert Behavior During Therapy: WFL for tasks assessed/performed Overall Cognitive Status: Within Functional Limits for tasks assessed General Comments: Able to communicate all areas in which he is experiencing numbness as well as how it has/hasnl;'t changed since day prior.     Blood pressure 125/74, pulse 80, temperature 97.6 F (36.4 C), temperature source Oral, resp. rate 20, height 5\' 10"  (1.778 m), weight 90.5 kg, SpO2 93 %.     General: No apparent distress HEENT: Head is normocephalic, atraumatic, PERRLA, EOMI, sclera anicteric, oral mucosa pink and moist, dentition intact, ext ear canals clear,  Neck: Supple without JVD or lymphadenopathy Heart: Reg rate and rhythm. No murmurs rubs or gallops Chest: CTA bilaterally without wheezes, rales, or rhonchi; no distress, good air movement Abdomen: Soft, non-tender, non-distended, bowel sounds positive. Extremities: No clubbing, cyanosis, or edema. Pulses are 2+ Psych: Pt's affect is appropriate. Pt is cooperative Skin: Clean and intact without signs of breakdown Neuro:  Alert and oriented x4, follows commands, normal speech and language, CN 2-12 intact FTN intact b/l, HTS decreased b/l Strength 5/5 inb/l UE Strength 4+/5 in b/l LE DTR decreased throughout   Sensation decreased in distal hands and decreased bellow  the knee bilaterally, absent sensation at b/l feet to LT Musculoskeletal: No joint swelling or tenderness IV LUE, R IJ catheter in place   Lab Results Last 48 Hours        Results for orders placed or performed during the hospital encounter of 10/24/22 (from the past 48 hour(s))  CBC     Status: None    Collection Time: 11/01/22  3:02 AM  Result Value Ref Range    WBC 9.0 4.0 - 10.5 K/uL    RBC 4.31 4.22 - 5.81 MIL/uL    Hemoglobin 14.6 13.0 - 17.0 g/dL    HCT 11/03/22 40.9 - 73.5 %    MCV 97.0 80.0 - 100.0 fL    MCH 33.9 26.0 - 34.0 pg    MCHC 34.9 30.0 - 36.0 g/dL    RDW 32.9  11.5 - 15.5 %    Platelets 186 150 - 400 K/uL    nRBC 0.0 0.0 - 0.2 %      Comment: Performed at [facility], [address] [place], [place] [address]  Basic metabolic panel     Status: Abnormal    Collection Time: 11/01/22  3:02 AM  Result Value Ref Range    Sodium 138 135 - 145 mmol/L    Potassium 4.0 3.5 - 5.1 mmol/L    Chloride 105 98 - 111 mmol/L    CO2 22 22 - 32 mmol/L    Glucose, Bld 126 (H) 70 - 99 mg/dL      Comment: Glucose reference range applies only to samples taken after fasting for at least 8 hours.    BUN 21 8 - 23 mg/dL    Creatinine, Ser 6.041.05 0.61 - 1.24 mg/dL    Calcium 8.9 8.9 - 54.010.3 mg/dL    GFR, Estimated >98>60 >11>60 mL/min      Comment: (NOTE) Calculated using the CKD-EPI Creatinine Equation (2021)      Anion gap 11 5 - 15      Comment: Performed at [facility], [address] [place], [place] [address]  CBC     Status: None    Collection Time: 11/02/22  4:45 AM  Result Value Ref Range    WBC 8.8 4.0 - 10.5 K/uL    RBC 4.40 4.22 - 5.81 MIL/uL    Hemoglobin 14.8 13.0 - 17.0 g/dL    HCT 29.542.7 62.139.0 - 30.852.0 %    MCV 97.0 80.0 - 100.0 fL    MCH 33.6 26.0 - 34.0 pg    MCHC 34.7 30.0 - 36.0 g/dL    RDW 65.713.2 84.611.5 - 96.215.5 %    Platelets 185 150 - 400 K/uL    nRBC 0.0 0.0 - 0.2 %      Comment: Performed at [facility], [address] [place], [place] [address]   Basic metabolic panel     Status: Abnormal    Collection Time: 11/02/22  4:45 AM  Result Value Ref Range    Sodium 139 135 - 145 mmol/L    Potassium 3.7 3.5 - 5.1 mmol/L    Chloride 109 98 - 111 mmol/L    CO2 22 22 - 32 mmol/L    Glucose, Bld 122 (H) 70 - 99 mg/dL      Comment: Glucose reference range applies only to samples taken after fasting for at least 8 hours.    BUN 17 8 - 23 mg/dL    Creatinine, Ser 1.320.96 0.61 - 1.24 mg/dL    Calcium 9.7 8.9 - 44.010.3 mg/dL    GFR, Estimated >10>60 >27>60 mL/min      Comment: (NOTE) Calculated using the CKD-EPI Creatinine Equation (2021)      Anion gap 8 5 - 15      Comment: Performed at [facility], [address] [place], [place] [address]      Imaging Results (Last 48 hours)  No results found.         Blood pressure 125/74, pulse 80, temperature 97.6 F (36.4 C), temperature source Oral, resp. rate 20, height 5\' 10"  (1.778 m), weight 90.5 kg, SpO2 93 %.   Medical Problem List and Plan: 1. 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  Antithrombotics: -DVT/anticoagulation:  Pharmaceutical: Lovenox             -antiplatelet therapy: N/A 3. Pain Management:  tylenol prn. Gabapentin increase from  to  TID for neuropathy. 4. Mood/Behavior/Sleep: LCSW to follow for evaluation and support.              --trazodone prn for insomnia.              -antipsychotic agents: N/A 5. Neuropsych/cognition: This patient is capable of making decisions on his own behalf. 6. Skin/Wound Care: Routine pressure relief measures.  7. Fluids/Electrolytes/Nutrition: Monitor I/O.  --Check CMET in am.  8. HTN:  Monitor BP TID--continue Cozaar. Well controlled.  9. T2DM: Hgb A1C-6.5 and new diagnosis w/last admisison? Will recheck fasting level. --will add CM restrictions. Monitor BS ac/hs X 48 hours and d/c if controlled.  10. AAA. Outpatient f/u 11. Emphysema. No signs of exacerbation at this time.      Jacquelynn Cree, PA-C 11/02/2022   I have personally performed a face to face diagnostic evaluation of this patient and formulated the key components of the plan.  Additionally, I have personally reviewed laboratory data, imaging studies, as well as relevant notes and concur with the physician assistant's documentation above.   The patient's status has not changed from the original H&P.  Any changes in documentation from the acute care chart have been noted above.   Fanny Dance, MD, [place]

## 2022-11-02 NOTE — H&P (Signed)
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 He was treated with IVIG X 5 days and d/c to home walking without assistance. He started developing weakness 11/27 with sensory deficits from waist down and difficulty walking. He was evaluated by Dr. 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 Zannie CoveYan's office for treatment. Central line placed on 12/14 and he was started on IVIG with improvement in symptoms. He completes 5/5 exchange today and reports improvement in sensory deficits--now has numbness in feet and bilateral palms. NIF-32 and vital capacity 2.7L.  Neurology does not plan for further plasma exchange, plan for catheter removal. PT/OT has been working with patient who continues to be limited by neuropathy with sensory deficits, balance deficits and decrease in BUE [facility]. CIR recommended due to functional decline.    Review of Systems  Constitutional:  Negative for fever.  HENT:  Negative for hearing loss.   Eyes:  Negative for blurred vision and double vision.  Respiratory:  Negative for cough and hemoptysis.   Cardiovascular:  Negative for chest pain and palpitations.  Gastrointestinal:  Positive for heartburn. Negative for constipation and nausea.  Genitourinary:  Positive for urgency (due to sensory deficits--doesn't know if he's done). Negative for dysuria.  Musculoskeletal:  Negative for joint pain.  Skin:  Negative for rash.  Neurological:  Positive for tingling, sensory change and weakness.  Psychiatric/Behavioral:  The patient has insomnia (gets up at 3 am daily to urinate).      Past Medical History:  Diagnosis  Date   AAA (abdominal aortic aneurysm) (HCC)    Cataract    forming     Past Surgical History:  Procedure Laterality Date   FOOT SURGERY Right    HERNIA REPAIR Right    IR FLUORO GUIDE CV LINE RIGHT  10/25/2022   IR US GUIDE VASC ACCESS RIGHT  10/25/2022   KNEE ARTHROSCOPY Left    WISDOM TOOTH EXTRACTION      Family History  Problem Relation Age of Onset   Stroke Mother [id number]   AAA (abdominal aortic aneurysm) Mother    Cancer Father        Prostate   Colon cancer Neg Hx    Colon polyps Neg Hx    Esophageal cancer Neg Hx    Rectal cancer Neg Hx    Stomach cancer Neg Hx     Social History: Married. 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 He reports that he quit smoking about 6 weeks ago. His smoking use included cigarettes--2PPD. He has a 100.00 pack-year smoking history. He has never used smokeless tobacco. He reports current alcohol use of about 2 shots of liquor per week.  He reports that he does not use drugs.   Allergies: No Known Allergies   Medications Prior to Admission  Medication Sig Dispense Refill   aspirin EC 81 MG tablet Take 81 mg by mouth daily. Swallow whole.     docusate sodium (COLACE) 50 MG capsule Take 150 mg by mouth daily.     ferrous sulfate 325 (65 FE) MG EC tablet Take 325 mg by mouth daily. When able to  remember     gabapentin (NEURONTIN) 300 MG capsule Take 1 capsule (300 mg total) by mouth 3 (three) times daily. 90 capsule 11   ibuprofen (ADVIL) 200 MG tablet Take 400 mg by mouth 3 (three) times daily as needed for headache or mild pain.     losartan (COZAAR) 25 MG tablet Take 1 tablet (25 mg total) by mouth daily. 30 tablet 1   Multiple Vitamin (MULTIVITAMIN WITH MINERALS) TABS tablet Take 1 tablet by mouth daily. 30 tablet 1   nicotine (NICODERM CQ - DOSED IN MG/24 HOURS) 21 mg/24hr patch Place 1 patch (21 mg total) onto the skin daily. 28 patch 0   Omega-3 Fatty Acids (FISH OIL PO) Take 1 capsule by mouth daily. When able to remember      sildenafil (VIAGRA) 100 MG tablet Take 0.5-1 tablets (50-100 mg total) by mouth daily as needed for erectile dysfunction. (Patient not taking: Reported on 10/25/2022) 5 tablet 11      Home: Home Living Family/patient expects to be discharged to:: Private residence Living Arrangements: Spouse/significant other Available Help at Discharge: Family, Available 24 hours/day Type of Home: House Home Access: Ramped entrance Home Layout: Able to live on main level with bedroom/bathroom Bathroom Shower/Tub: Multimedia programmer: Standard Bathroom Accessibility: Yes Home Equipment: Grab bars - tub/shower, Shower seat, Conservation officer, nature (2 wheels), Wheelchair - manual Additional Comments: DME was wife's after prior hospitalization  Lives With: Spouse   Functional History: Prior Function Prior Level of Function : Independent/Modified Independent Mobility Comments: Manages farm; provides maintenance ADLs Comments: Indep  Functional Status:  Mobility: Bed Mobility Overal bed mobility: Needs Assistance Bed Mobility: Supine to Sit Supine to sit: HOB elevated, Min guard Sit to supine: Min guard, HOB elevated General bed mobility comments: increased time and effort Transfers Overall transfer level: Needs assistance Equipment used: Rolling walker (2 wheels) Transfers: Sit to/from Stand Sit to Stand: Min guard General transfer comment: Min guard A for safety Ambulation/Gait Ambulation/Gait assistance: Mod assist, Min assist Gait Distance (Feet): 50 Feet (x multiple bouts) Assistive device: Rolling walker (2 wheels) Gait Pattern/deviations: Step-through pattern, Decreased stride length, Knees buckling, Wide base of support General Gait Details: Progressed with forward gait and turns, navigating 50 feet x2 with seated rest break.Mod assist for balancex 3 instances, LEs buckling but demonstrates good UE strength to support himself. Cues for sequencing and walker placement with turns.  noting tendency for knees to push bck into hyperextension for stance stability; R ankle rolled once during today's walk Gait velocity: decreased Gait velocity interpretation: <1.31 ft/sec, indicative of household ambulator Pre-gait activities: Tolerated several pre-gait activities focusing on weight shift, march, and balance at EOB prior to walking.    ADL: ADL Overall ADL's : Needs assistance/impaired Eating/Feeding: Set up, Sitting Eating/Feeding Details (indicate cue type and reason): Pt self feeding when initially entering room with min difficulty. limited spillage and no assist needed. Pt not interested in built up grip Grooming: Standing, Oral care, Minimal assistance, Min guard Grooming Details (indicate cue type and reason): Min guard-min A for balance when using BUE Upper Body Bathing: Set up, Sitting Lower Body Bathing: Min guard, Sitting/lateral leans Lower Body Bathing Details (indicate cue type and reason): simulated, able to figure 4 Upper Body Dressing : Set up, Sitting Upper Body Dressing Details (indicate cue type and reason): donning gown on back side. Pt buttoning buttons on gown with increased time and reliance on vision to line up buttons. Lower Body Dressing: Sitting/lateral leans, Supervision/safety Lower Body  Dressing Details (indicate cue type and reason): Adusting socks supine, but able to perform figure 4 to don socks sitting EOB with supervision Toilet Transfer: Minimal assistance, Rolling walker (2 wheels), Ambulation Toilet Transfer Details (indicate cue type and reason): Min A for balance during functional mobility into restroom. Pt reports his L foot feels like it is dragging, however, not observed to be actively dragging floor Toileting- Clothing Manipulation and Hygiene: Min guard, Sitting/lateral lean Toileting - Clothing Manipulation Details (indicate cue type and reason): Wife holding urinal to use in standing position. Functional mobility during ADLs:  Minimal assistance, Rolling walker (2 wheels) (OT pulling chair behind pt for rest break if needed, but pt denying need for rest break. Mild instability of R knee at times, and pt continues to complain of decreased feeling in feet;) General ADL Comments: ADl participation impacted by decreased activity tolerance, impaired balance. decreased BUE Novelty and generalized deconditioning  Cognition: Cognition Overall Cognitive Status: Within Functional Limits for tasks assessed Orientation Level: Oriented X4 Cognition Arousal/Alertness: Awake/alert Behavior During Therapy: WFL for tasks assessed/performed Overall Cognitive Status: Within Functional Limits for tasks assessed General Comments: Able to communicate all areas in which he is experiencing numbness as well as how it has/hasnl;'t changed since day prior.   Blood pressure 125/74, pulse 80, temperature 97.6 F (36.4 C), temperature source Oral, resp. rate 20, height 5\' 10"  (1.778 m), weight 90.5 kg, SpO2 93 %.   General: No apparent distress HEENT: Head is normocephalic, atraumatic, PERRLA, EOMI, sclera anicteric, oral mucosa pink and moist, dentition intact, ext ear canals clear,  Neck: Supple without JVD or lymphadenopathy Heart: Reg rate and rhythm. No murmurs rubs or gallops Chest: CTA bilaterally without wheezes, rales, or rhonchi; no distress, good air movement Abdomen: Soft, non-tender, non-distended, bowel sounds positive. Extremities: No clubbing, cyanosis, or edema. Pulses are 2+ Psych: Pt's affect is appropriate. Pt is cooperative Skin: Clean and intact without signs of breakdown Neuro:  Alert and oriented x4, follows commands, normal speech and language, CN 2-12 intact FTN intact b/l, HTS decreased b/l Strength 5/5 inb/l UE Strength 4+/5 in b/l LE DTR decreased throughout   Sensation decreased in distal hands and decreased bellow the knee bilaterally, absent sensation at b/l feet to LT Musculoskeletal: No joint swelling or  tenderness IV LUE, R IJ catheter in place   Results for orders placed or performed during the hospital encounter of 10/24/22 (from the past 48 hour(s))  CBC     Status: None   Collection Time: 11/01/22  3:02 AM  Result Value Ref Range   WBC 9.0 4.0 - 10.5 K/uL   RBC 4.31 4.22 - 5.81 MIL/uL   Hemoglobin 14.6 13.0 - 17.0 g/dL   HCT 41.8 39.0 - 52.0 %   MCV 97.0 80.0 - 100.0 fL   MCH 33.9 26.0 - 34.0 pg   MCHC 34.9 30.0 - 36.0 g/dL   RDW 13.2 11.5 - 15.5 %   Platelets 186 150 - 400 K/uL   nRBC 0.0 0.0 - 0.2 %    Comment: Performed at [facility], [place] [address], [place], [place] [id number]  Basic metabolic panel     Status: Abnormal   Collection Time: 11/01/22  3:02 AM  Result Value Ref Range   Sodium 138 135 - 145 mmol/L   Potassium 4.0 3.5 - 5.1 mmol/L   Chloride 105 98 - 111 mmol/L   CO2 22 22 - 32 mmol/L   Glucose, Bld 126 (H) 70 - 99  mg/dL    Comment: Glucose reference range applies only to samples taken after fasting for at least 8 hours.   BUN 21 8 - 23 mg/dL   Creatinine, Ser 4.78 0.61 - 1.24 mg/dL   Calcium 8.9 8.9 - 29.5 mg/dL   GFR, Estimated >62 >13 mL/min    Comment: (NOTE) Calculated using the CKD-EPI Creatinine Equation (2021)    Anion gap 11 5 - 15    Comment: Performed at [facility], [address], [place], [place] [address]  CBC     Status: None   Collection Time: 11/02/22  4:45 AM  Result Value Ref Range   WBC 8.8 4.0 - 10.5 K/uL   RBC 4.40 4.22 - 5.81 MIL/uL   Hemoglobin 14.8 13.0 - 17.0 g/dL   HCT 84.6 96.2 - 95.2 %   MCV 97.0 80.0 - 100.0 fL   MCH 33.6 26.0 - 34.0 pg   MCHC 34.7 30.0 - 36.0 g/dL   RDW 84.1 32.4 - 40.1 %   Platelets 185 150 - 400 K/uL   nRBC 0.0 0.0 - 0.2 %    Comment: Performed at [facility], [address], [place], [place] [address]  Basic metabolic panel     Status: Abnormal   Collection Time: 11/02/22  4:45 AM  Result Value Ref Range   Sodium 139 135 - 145 mmol/L   Potassium 3.7 3.5 - 5.1 mmol/L    Chloride 109 98 - 111 mmol/L   CO2 22 22 - 32 mmol/L   Glucose, Bld 122 (H) 70 - 99 mg/dL    Comment: Glucose reference range applies only to samples taken after fasting for at least 8 hours.   BUN 17 8 - 23 mg/dL   Creatinine, Ser 3.66 0.61 - 1.24 mg/dL   Calcium 9.7 8.9 - 44.0 mg/dL   GFR, Estimated >34 >74 mL/min    Comment: (NOTE) Calculated using the CKD-EPI Creatinine Equation (2021)    Anion gap 8 5 - 15    Comment: Performed at [facility], [address], [place], [place] [address]   No results found.    Blood pressure 125/74, pulse 80, temperature 97.6 F (36.4 C), temperature source Oral, resp. rate 20, height 5\' 10"  (1.778 m), weight 90.5 kg, SpO2 93 %.  Medical Problem List and Plan: 1. 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  Antithrombotics: -DVT/anticoagulation:  Pharmaceutical: Lovenox  -antiplatelet therapy: N/A 3. Pain Management:  tylenol prn. Gabapentin increase from 300mg  to 400mg  TID for neuropathy. 4. Mood/Behavior/Sleep: LCSW to follow for evaluation and support.   --trazodone prn for insomnia.   -antipsychotic agents: N/A 5. Neuropsych/cognition: This patient is capable of making decisions on his own behalf. 6. Skin/Wound Care: Routine pressure relief measures.  7. Fluids/Electrolytes/Nutrition: Monitor I/O.  --Check CMET in am.  8. HTN: Monitor BP TID--continue Cozaar. Well controlled.  9. T2DM: Hgb A1C-6.5 and new diagnosis w/last admisison? Will recheck fasting level. --will add CM restrictions. Monitor BS ac/hs X 48 hours and d/c if controlled.  10. AAA. Outpatient f/u 11. Emphysema. No signs of exacerbation at this time.    ,  PA-C 11/02/2022  I have personally  performed a face to face diagnostic evaluation of this patient and formulated the key components of the plan.  Additionally, I have personally reviewed laboratory data, imaging studies, as well as relevant notes and concur with the physician assistant's documentation above.  The patient's status has not changed from the original H&P.  Any changes in documentation from the acute care chart have been noted above.  Jennye Boroughs, MD, Mellody Drown

## 2022-11-02 NOTE — Plan of Care (Signed)
  Problem: Nutrition: Goal: Dietary intake will improve Outcome: Progressing   Problem: Clinical Measurements: Goal: Will remain free from infection Outcome: Progressing   Problem: Clinical Measurements: Goal: Diagnostic test results will improve Outcome: Progressing

## 2022-11-02 NOTE — Assessment & Plan Note (Signed)
Continue with gabapentin 

## 2022-11-02 NOTE — Progress Notes (Signed)
  Progress Note   Patient: Joe Greene OTL:[id number] DOB: [birth year] DOA: 10/24/2022     8 DOS: the patient was seen and examined on 11/02/2022   Brief hospital course: Joe Greene was admitted to the hospital with the working diagnosis of acute demyelinating polyneuropathy.    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 At home he had recurrent and worsening ascending paresthesias, urinary retention, difficulty ambulating, not able to stand and persistent neuropathic pain. Patient was evaluated by neurology as outpatient, and had a nerve conduction study which showed acute inflammatory demyelinating neuropathy. Patient was referred to the hospital for further management.    Na 138, K 4.2 Cl 106, bicarbonate 21, glucose 110 bun 20 cr 0,90  Wbc 8,3 hgb 15.7 plt 258  Urine analysis with SG 1,021, pH 5,0, negative protein and negative leukocytes.    Chest radiograph with no cardiomegaly, no infiltrates or effusions.    EKG 77 bpm, normal axis, normal intervals, sinus rhythm with no significant ST segment or T wave changes.    Neurology has been consulted with recommendations for plasmapheresis.  IR consulted for catheter placement.    12/15 Plasma exchange (1st), NIF greater than -40 and VC 2,7 L.  12/16 Plan to continue plasma exchange Monday, Tuesday, Thursday and Friday.  12/17 medically stable, continue to have paresthesias upper and lower extremities. Respiratory not compromised.  12/22. Patient had plasma exchange on 12/21 and will have another treatment today. Neurologically improving but not back yet to baseline.   Assessment and Plan: * Acute inflammatory demyelinating polyneuropathy (HCC) Clinically improving but not yet back to baseline.  NIF - 40  VC 2 L   Plan for plasma exchange today 12/22. Continue to monitor response Gabapentin for neuropathic pain. PT and OT, patient will benefit from  inpatient rehab.   Essential hypertension -Continue losartan  AAA (abdominal aortic aneurysm) (HCC) Stable. Outpatient follow-up.  Acute sensory neuropathy Continue with gabapentin.   Emphysema of lung (HCC) Stable. No signs of exacerbation  Iron deficiency Patient is managed on iron supplementation as an outpatient. No associated anemia.        Subjective: Patient is feeling better, but continue to be weak and not back to baseline, no dyspnea or chest pain   Physical Exam: Vitals:   11/01/22 2246 11/02/22 0500 11/02/22 0636 11/02/22 0851  BP: 110/65  117/72 125/74  Pulse: 69  63 80  Resp: 18  18 20   Temp: 98.5 F (36.9 C)  97.7 F (36.5 C) 97.6 F (36.4 C)  TempSrc: Oral  Oral Oral  SpO2: 97%  99% 93%  Weight:  90.5 kg    Height:       Neurology awake and alert ENT with no pallor Cardiovascular with S1 and S2 present and rhythmic Respiratory with no rales or wheezing Abdomen with no distention  No lower extremity edema  Data Reviewed:    Family Communication: I spoke with Joe Greene at the bedside, we talked in detail about Joe condition, plan of care and prognosis and all questions were addressed.   Disposition: Status is: Inpatient Remains inpatient appropriate because: plasma exchange treatments.   Planned Discharge Destination: Rehab    Author: , MD 11/02/2022 9:38 AM  For on call review [email or web address].

## 2022-11-02 NOTE — Discharge Summary (Signed)
Physician Discharge Summary   Patient: Joe Greene MRN: [id number] DOB: [birth year]  Admit date:     10/24/2022  Discharge date: 11/02/22  Discharge Physician: Joe Greene Joe Greene   PCP: Joe Salter, MD   Recommendations at discharge:    Patient with improvement of his symptoms but not completely at baseline, plan for transfer to inpatient rehab to continue therapy. IR will follow patient in CIR to coordinate removal of temporary CV catheter used for pheresis.  Continue gabapentin and as needed ibuprofen.    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 [facility] Course: [name] was admitted to the hospital with the working diagnosis of acute demyelinating polyneuropathy.    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 At home he had recurrent and worsening ascending paresthesias, urinary retention, difficulty ambulating, not able to stand and persistent neuropathic pain. Patient was evaluated by neurology as outpatient, and had a nerve conduction study which showed acute inflammatory demyelinating neuropathy. Patient was referred to the hospital for further management.    Na 138, K 4.2 Cl 106, bicarbonate 21, glucose 110 bun 20 cr 0,90  Wbc 8,3 hgb 15.7 plt 258  Urine analysis with SG 1,021, pH 5,0, negative protein and negative leukocytes.    Chest radiograph with no cardiomegaly, no infiltrates or effusions.    EKG 77 bpm, normal axis, normal intervals, sinus rhythm with no significant ST segment or T wave changes.    Neurology has been consulted with recommendations for plasmapheresis.  IR consulted for HD catheter placement.     12/15 Plasma exchange (1st), NIF greater than -40 and VC 2,7 L.  12/16 Plan to continue plasma exchange Monday, Tuesday, Thursday and Friday.  12/17 medically stable, continue to have paresthesias upper and lower extremities. Respiratory not compromised.  12/22. Patient had plasma exchange on 12/21 and will have another treatment today. Neurologically improving but not back yet to baseline.  He will be transferred to inpatient rehab.   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 Neuro checks.  Q 12 hrs NIF and Vital capacity monitoring.  HD catheter was placed and patient underwent plasma exchange.   Completed 5 treatments with improvement of his symptoms but not yet back to baseline.  Patient was evaluated by PT and OT with recommendations for continue therapy at inpatient rehab.  Gabapentin for neuropathic pain. As needed ibuprofen.   Discussed with neurology and no further plans for plasma exchange, will plan to remove HD catheter, IR has been consulted and will follow patient in inpatient rehab to coordinate removal of the catheter.   Essential hypertension His blood pressure remained well controlled, continue with losartan.   AAA (abdominal aortic aneurysm) (HCC) Stable. Outpatient follow-up.  Acute sensory neuropathy Continue with gabapentin.   Emphysema of lung ([place]) Stable. No signs of exacerbation  Iron deficiency Patient is managed on iron supplementation as an outpatient. No associated anemia.         Consultants: IR, neurology  Procedures performed: temporary HD catheter for pheresis.   Disposition: Rehabilitation facility Diet recommendation:  Discharge Diet  Orders (From admission, onward)     Start     Ordered   11/02/22 0000  Diet - low sodium heart healthy        11/02/22 1345           Cardiac diet DISCHARGE  MEDICATION: Allergies as of 11/02/2022   No Known Allergies      Medication List     STOP taking these medications    sildenafil 100 MG tablet Commonly known as: Viagra       TAKE these medications    acetaminophen 325 MG tablet Commonly known as: TYLENOL Take 2 tablets (650 mg total) by mouth every 6 (six) hours as needed for mild pain (or Fever >/= 101).   aspirin EC 81 MG tablet Take 81 mg by mouth daily. Swallow whole.   docusate sodium 50 MG capsule Commonly known as: COLACE Take 150 mg by mouth daily.   ferrous sulfate 325 (65 FE) MG EC tablet Take 325 mg by mouth daily. When able to remember   FISH OIL PO Take 1 capsule by mouth daily. When able to remember   gabapentin 300 MG capsule Commonly known as: NEURONTIN Take 1 capsule (300 mg total) by mouth 3 (three) times daily.   ibuprofen 200 MG tablet Commonly known as: ADVIL Take 400 mg by mouth 3 (three) times daily as needed for headache or mild pain.   losartan 25 MG tablet Commonly known as: COZAAR Take 1 tablet (25 mg total) by mouth daily.   multivitamin with minerals Tabs tablet Take 1 tablet by mouth daily.   nicotine 21 mg/24hr patch Commonly known as: NICODERM CQ - dosed in mg/24 hours Place 1 patch (21 mg total) onto the skin daily.        Discharge Exam: Filed Weights   11/01/22 0400 11/01/22 1026 11/02/22 0500  Weight: 89.6 kg 89.6 kg 90.5 kg   BP 125/74 (BP Location: Right Arm)   Pulse 80   Temp 97.6 F (36.4 C) (Oral)   Resp 20   Ht 5\' 10"  (1.778 m)   Wt 90.5 kg   SpO2 93%   BMI 28.63 kg/m   Neurology awake and alert ENT with no pallor Cardiovascular with S1 and S2 present and rhythmic Respiratory with no rales or wheezing Abdomen with no distention  No lower extremity edema   Condition at discharge: stable  The results of significant diagnostics from this hospitalization (including imaging, microbiology, ancillary and laboratory) are listed below for reference.    Imaging Studies: IR Fluoro Guide CV Line Right  Result Date: 10/25/2022 INDICATION: Guillain-Barre syndrome, access for plasmapheresis EXAM: ULTRASOUND GUIDANCE FOR VASCULAR ACCESS RIGHT INTERNAL JUGULAR TEMPORARY PHERESIS CATHETER Date:  10/25/2022 10/25/2022 5:35 pm Radiologist:  Jerilynn Mages. Daryll Brod, MD Guidance:  Ultrasound fluoroscopic FLUOROSCOPY: 0 minutes 24 seconds (5.0 mGy). MEDICATIONS: 1% lidocaine local ANESTHESIA/SEDATION: None. CONTRAST:  None. COMPLICATIONS: None immediate. PROCEDURE: Informed consent was obtained from the patient following explanation of the procedure, risks, benefits and alternatives. The patient understands, agrees and consents for the procedure. All questions were addressed. A time out was performed. Maximal barrier sterile technique utilized including caps, mask, sterile gowns, sterile gloves, large sterile drape, hand hygiene, and ChloraPrep. Under sterile conditions and local anesthesia, right internal jugular micropuncture access performed. Images obtained for documentation of the patent right internal jugular vein. Guidewire advanced followed by the transitional dilator set. Amplatz guidewire inserted. Tract dilatation performed to insert a 20 cm temporary pheresis catheter. Tip SVC RA junction.  Blood aspirated easily followed by saline and heparin flushes. External caps applied. Catheter secured with Prolene sutures and a sterile dressing. No immediate complication. Patient tolerated the procedure well. IMPRESSION: Successful ultrasound fluoroscopic right IJ temporary paresis catheter. Tip SVC RA junction. Ready for use. Electronically Signed   By: Jerilynn Mages.  Shick M.D.   On: 10/25/2022 18:04   IR US Guide Vasc Access Right  Result Date: 10/25/2022 INDICATION: Guillain-Barre syndrome, access for plasmapheresis EXAM: ULTRASOUND GUIDANCE FOR VASCULAR ACCESS RIGHT INTERNAL JUGULAR TEMPORARY PHERESIS CATHETER Date:  10/25/2022 10/25/2022 5:35 pm Radiologist:  Jerilynn Mages. Daryll Brod,  MD Guidance:  Ultrasound fluoroscopic FLUOROSCOPY: 0 minutes 24 seconds (5.0 mGy). MEDICATIONS: 1% lidocaine local ANESTHESIA/SEDATION: None. CONTRAST:  None. COMPLICATIONS: None immediate. PROCEDURE: Informed consent was obtained from the patient following explanation of the procedure, risks, benefits and alternatives. The patient understands, agrees and consents for the procedure. All questions were addressed. A time out was performed. Maximal barrier sterile technique utilized including caps, mask, sterile gowns, sterile gloves, large sterile drape, hand hygiene, and ChloraPrep. Under sterile conditions and local anesthesia, right internal jugular micropuncture access performed. Images obtained for documentation of the patent right internal jugular vein. Guidewire advanced followed by the transitional dilator set. Amplatz guidewire inserted. Tract dilatation performed to insert a 20 cm temporary pheresis catheter. Tip SVC RA junction. Blood aspirated easily followed by saline and heparin flushes. External caps applied. Catheter secured with Prolene sutures and a sterile dressing. No immediate complication. Patient tolerated the procedure well. IMPRESSION: Successful ultrasound fluoroscopic right IJ temporary paresis catheter. Tip SVC RA junction. Ready for use. Electronically Signed   By: Jerilynn Mages.  Shick M.D.   On: 10/25/2022 18:04   DG Chest 2 View  Result Date: 10/24/2022 CLINICAL DATA:  Short of breath EXAM: CHEST - 2 VIEW COMPARISON:  09/24/2022 FINDINGS: Frontal and lateral views of the chest demonstrate a stable cardiac silhouette. No airspace disease, effusion, or pneumothorax. No acute bony abnormalities. IMPRESSION: 1. No acute intrathoracic process. Electronically Signed   By: Randa Ngo M.D.   On: 10/24/2022 17:15   NCV with EMG(electromyography)  Result Date: 10/24/2022 Marcial Pacas, MD     10/24/2022  2:48 PM     Full Name: [name] Gender: Male MRN #: [id number] Date of Birth: 1951/07/20    Visit Date: 10/24/2022 13:32 Age: 40 Years Examining Physician: Marcial Pacas Referring Physician: Marcial Pacas Height: 5 feet 10 inch History: 71 year old male presenting with subacute onset progressive ascending paresthesia, gait abnormality, autonomic dysfunction Summary of the test: nerve conduction study: Right sural, superficial peroneal, median, ulnar sensory responses were absent. Right radial sensory response showed moderately decreased snap amplitude, within normal range peak latency Right tibial, ulnar, and median motor responses all demonstrate significantly prolonged distal latency, temporal dispersion of the waveform, unreliable responses stimulating at proximal stimulation site, No F-wave latency was elicited stimulating of right tibialis nerve.  Right ulnar F-wave latency was severely prolonged Electromyography: Selected needle examinations were done at right lower, upper extremity muscles, right lumbar and cervical paraspinal muscles. There is evidence of increased insertional activity, complex and large motor unit potential noted at right distal neck and upper extremity muscles, there was no evidence of spontaneous activity. Right lumbosacral paraspinal muscles showed increased insertional activity, 1-2+ spontaneous activity, polyphasic motor unit potential. Conclusion: This is an abnormal study.  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 ------------------------------- Marcial Pacas M.D. Ph.D. [facility] [address] [place], [place] [address] Tel: [phone] Fax: [phone] Verbal informed consent was obtained from the patient, patient was informed of potential risk of procedure, including bruising, bleeding, hematoma formation, infection, muscle weakness, muscle pain, numbness, among others.     MNC   Nerve / Sites Muscle Latency Ref. Amplitude Ref. Rel Amp Segments  Distance Velocity Ref. Area   ms ms mV mV %  cm m/s m/s mVms R Median - APB    Wrist APB 22.9 ?4.4 0.1 ?4.0 100 Wrist - APB 7   0.2    Upper arm APB 30.2  1.2  1138 Upper arm - Wrist   ?49 10.1 R Ulnar - ADM    Wrist ADM 7.0 ?3.3 1.6 ?6.0 100 Wrist - ADM 7   4.8    B.Elbow ADM 10.4  0.8  50.3 B.Elbow - Wrist 16 47 ?49 1.4    A.Elbow ADM 12.8  2.5  305 A.Elbow - B.Elbow 13 55 ?49 12.9 R Tibial - AH    Ankle AH 14.8 ?5.8  ?4.0  Ankle - AH 9       Pop fossa AH      Pop fossa - Ankle   ?41          SNC   Nerve / Sites Rec. Site Peak Lat Ref.  Amp Ref. Segments Distance   ms ms V V  cm R Radial - Anatomical snuff box (Forearm)    Forearm Wrist 2.5 ?2.9 10 ?15 Forearm - Wrist 10 R Sural - Ankle (Calf)    Calf Ankle NR ?4.4 NR ?6 Calf - Ankle 14 R Superficial peroneal - Ankle    Lat leg Ankle NR ?4.4 NR ?6 Lat leg - Ankle 14 R Median - Orthodromic (Dig II, Mid palm)    Dig II Wrist NR ?3.4 NR ?10 Dig II - Wrist 13 R Ulnar - Orthodromic, (Dig V, Mid palm)    Dig V Wrist NR ?3.1 NR ?5 Dig V - Wrist 56             F  Wave   Nerve F Lat Ref.  ms ms R Ulnar - ADM 49.2 ?32.0 R Tibial - AH NR ?56.0       EMG Summary Table   Spontaneous MUAP Recruitment Muscle IA Fib PSW Fasc Other Amp Dur. Poly Pattern R. Tibialis anterior Increased None None None _______ Normal Increased 1+ Reduced R. Vastus lateralis Normal None None None _______ Normal Normal Normal Reduced R. Peroneus longus Normal None None None _______ Normal Normal Normal Reduced R. Tibialis posterior Normal None None None _______ Normal Normal Normal Reduced R. Gastrocnemius (Medial head) Normal None None None _______ Normal Normal Normal Reduced R. Lumbar paraspinals (low) Increased 1+ 1+ None _______ Normal Normal Normal Normal R. Lumbar paraspinals (mid) Increased 1+ 1+ None _______ Normal Normal Normal Normal R. First dorsal interosseous Normal None None None _______ Normal Normal Normal Reduced R. Pronator teres Normal None None None _______ Normal Normal Normal  Reduced R. Biceps brachii Normal None None None _______ Normal Normal Normal Reduced R. Deltoid Normal None None None _______ Normal Normal Normal Reduced R. Cervical paraspinals Normal None None None _______ Normal Normal Normal Reduced     Microbiology: Results for orders placed or performed during the hospital encounter of 09/24/22  Gram stain     Status: None   Collection Time: 09/25/22 12:54 PM   Specimen: CSF; Cerebrospinal Fluid  Result Value Ref Range Status   Specimen Description  CSF  Final   Special Requests NONE  Final   Gram Stain   Final    WBC PRESENT, PREDOMINANTLY MONONUCLEAR NO ORGANISMS SEEN CYTOSPIN SMEAR Performed at [facility], [place] [address], [place], [place] [address]    Report Status 09/25/2022 FINAL  Final    Labs: CBC: Recent Labs  Lab 10/27/22 0444 10/29/22 0958 10/30/22 1149 10/30/22 1506 11/01/22 0302 11/02/22 0445  WBC 11.4* 8.7 10.0  --  9.0 8.8  HGB 16.9 15.2 16.0 15.6 14.6 14.8  HCT 47.5 42.2 46.9 46.0 41.8 42.7  MCV 94.8 94.6 96.1  --  97.0 97.0  PLT 203 171 202  --  186 [id number]   Basic Metabolic Panel: Recent Labs  Lab 10/27/22 0444 10/29/22 0958 10/30/22 1506 10/31/22 0434 11/01/22 0302 11/02/22 0445  NA 138 137 138  --  138 139  K 4.5 3.6 4.0  --  4.0 3.7  CL 109 105 102  --  105 109  CO2 19* 23  --   --  22 22  GLUCOSE 128* 127* 106*  --  126* 122*  BUN 25* 22 19  --  21 17  CREATININE 1.14 0.92 0.90 0.96 1.05 0.96  CALCIUM 9.1 9.2  --   --  8.9 9.7   Liver Function Tests: No results for input(s): "AST", "ALT", "ALKPHOS", "BILITOT", "PROT", "ALBUMIN" in the last 168 hours. CBG: No results for input(s): "GLUCAP" in the last 168 hours.  Discharge time spent: greater than 30 minutes.  Signed: Tawni Millers, MD [facility] 11/02/2022

## 2022-11-02 NOTE — Progress Notes (Signed)
[name] to be discharged Rehab (CIR) per MD order. Patient verbalized understanding.  Skin clean, dry and intact without evidence of skin break down, no evidence of skin tears noted. R IJ catheter discontinued intact. Site without signs and symptoms of complications. Dressing clean and intact. Pt denies pain at the site currently. No complaints noted.  Patient free of lines, drains, and wounds.   Discharge packet assembled. Patient escorted  in wheelchair discharged to [facility]. Report called to accepting staff; all questions and concerns addressed.   Arvilla Meres, RN

## 2022-11-03 DIAGNOSIS — G610 Guillain-Barre syndrome: Secondary | ICD-10-CM | POA: Diagnosis not present

## 2022-11-03 LAB — COMPREHENSIVE METABOLIC PANEL
ALT: 13 U/L (ref 0–44)
AST: 14 U/L — ABNORMAL LOW (ref 15–41)
Albumin: 4 g/dL (ref 3.5–5.0)
Alkaline Phosphatase: 9 U/L — ABNORMAL LOW (ref 38–126)
Anion gap: 10 (ref 5–15)
BUN: 16 mg/dL (ref 8–23)
CO2: 20 mmol/L — ABNORMAL LOW (ref 22–32)
Calcium: 8.6 mg/dL — ABNORMAL LOW (ref 8.9–10.3)
Chloride: 110 mmol/L (ref 98–111)
Creatinine, Ser: 0.93 mg/dL (ref 0.61–1.24)
GFR, Estimated: >60 mL/min (ref >60)
Glucose, Bld: 120 mg/dL — ABNORMAL HIGH (ref 70–99)
Potassium: 3.9 mmol/L (ref 3.5–5.1)
Sodium: 140 mmol/L (ref 135–145)
Total Bilirubin: 0.3 mg/dL (ref 0.3–1.2)
Total Protein: 4.7 g/dL — ABNORMAL LOW (ref 6.5–8.1)

## 2022-11-03 LAB — CBC WITH DIFFERENTIAL/PLATELET
Abs Immature Granulocytes: 0.04 10*3/uL (ref 0.00–0.07)
Basophils Absolute: 0.1 10*3/uL (ref 0.0–0.1)
Basophils Relative: 1 %
Eosinophils Absolute: 0.8 10*3/uL — ABNORMAL HIGH (ref 0.0–0.5)
Eosinophils Relative: 10 %
HCT: 41.2 % (ref 39.0–52.0)
Hemoglobin: 13.7 g/dL (ref 13.0–17.0)
Immature Granulocytes: 1 %
Lymphocytes Relative: 28 %
Lymphs Abs: 2.3 10*3/uL (ref 0.7–4.0)
MCH: 32.9 pg (ref 26.0–34.0)
MCHC: 33.3 g/dL (ref 30.0–36.0)
MCV: 99 fL (ref 80.0–100.0)
Monocytes Absolute: 0.7 10*3/uL (ref 0.1–1.0)
Monocytes Relative: 8 %
Neutro Abs: 4.3 10*3/uL (ref 1.7–7.7)
Neutrophils Relative %: 52 %
Platelets: 188 10*3/uL (ref 150–400)
RBC: 4.16 MIL/uL — ABNORMAL LOW (ref 4.22–5.81)
RDW: 13.3 % (ref 11.5–15.5)
WBC: 8.2 10*3/uL (ref 4.0–10.5)
nRBC: 0 % (ref 0.0–0.2)

## 2022-11-03 LAB — GLUCOSE, CAPILLARY
Glucose-Capillary: 120 mg/dL — ABNORMAL HIGH (ref 70–99)
Glucose-Capillary: 105 mg/dL — ABNORMAL HIGH (ref 70–99)
Glucose-Capillary: 101 mg/dL — ABNORMAL HIGH (ref 70–99)
Glucose-Capillary: 111 mg/dL — ABNORMAL HIGH (ref 70–99)

## 2022-11-03 NOTE — Progress Notes (Signed)
Occupational Therapy Session Note  Patient Details  Name: Joe Greene MRN: [id number] Date of Birth: [birth year]  Today's Date: 11/03/2022 OT Individual Time: 1415-1500 OT Individual Time Calculation (min): 45 min    Short Term Goals: Week 1:  OT Short Term Goal 1 (Week 1): STG= LTG d/t ELOS  Skilled Therapeutic Interventions/Progress Updates:    Pt received in the recliner with no c/o pain, agreeable to OT session and excited to participate. He completed 200 ft of functional mobility with the RW to the therapy gym. He had one instance of B knee buckling, requiring mod A to correct. The remainder was at [employer]- min A level overall. He began with [facility] task- using BUE to place small pegs into a pegboard and then using tweezers to remove. He demonstrated fatigue with this but overall able to complete with (S). Functional activity addressing standing balance without UE support completed with pt holding a 1kg medicine ball in BUE and completing diagonal reaching. Min A overall required with frequent balance perturbations/postural sways. 2x15 repetitions completed. 2x10 mini squats completed with min-mod A overall with cueing required for proper body mechanics and form. Activities completed to increase independence in ADL/ADL transfers and reduce fall risk. He completed 3x10 glute bridges in supine to strengthen posterior chain for carryover to ADL transfers.  Pt returned to his room via 200 ft of functional mobility with the RW. Pt given theraputty instructions to allow him to work on [facility] / strengthening when not in therapy session. He was left sitting on the [facility] over the toilet, reporting he will call out to NT when he is done.    Therapy Documentation Precautions:  Precautions Precautions: Fall Precaution Comments: Neuropathy in Bil lower legs and feet, as well as Bil fingertips Restrictions Weight Bearing Restrictions: No  Therapy/Group: Individual Therapy  Crissie Reese 11/03/2022, 2:25 PM

## 2022-11-03 NOTE — Progress Notes (Signed)
Aquathermia was refused states he will call out when its needed.

## 2022-11-03 NOTE — Plan of Care (Signed)
Patient sitting in chair. No signs of respiratory distress noted. NIF -35 with good effort. VC 1.88 with good effort.

## 2022-11-03 NOTE — Progress Notes (Signed)
PROGRESS NOTE   Subjective/Complaints: BP soft Has soreness in legs- kpad ordered No other complaints Labs stable   Objective:   No results found. Recent Labs    11/02/22 0445 11/03/22 0600  WBC 8.8 8.2  HGB 14.8 13.7  HCT 42.7 41.2  PLT 185 188   Recent Labs    11/02/22 0445 11/03/22 0600  NA 139 140  K 3.7 3.9  CL 109 110  CO2 22 20*  GLUCOSE 122* 120*  BUN 17 16  CREATININE 0.96 0.93  CALCIUM 9.7 8.6*    Intake/Output Summary (Last 24 hours) at 11/03/2022 1600 Last data filed at 11/03/2022 1310 Gross per 24 hour  Intake 358 ml  Output 700 ml  Net -342 ml        Physical Exam: Vital Signs Blood pressure (!) 112/53, pulse 76, temperature 98.5 F (36.9 C), temperature source Oral, resp. rate 17, height 5\' 10"  (1.778 m), weight 91 kg, SpO2 95 %. Gen: no distress, normal appearing HEENT: oral mucosa pink and moist, NCAT Cardio: Reg rate Chest: normal effort, normal rate of breathing Abd: soft, non-distended Ext: no edema Psych: pleasant, normal affect Skin: intact Neuro:  Alert and oriented x4, follows commands, normal speech and language, CN 2-12 intact FTN intact b/l, HTS decreased b/l Strength 5/5 inb/l UE Strength 4+/5 in b/l LE DTR decreased throughout   Sensation decreased in distal hands and decreased bellow the knee bilaterally, absent sensation at b/l feet to LT Musculoskeletal: No joint swelling or tenderness IV LUE, R IJ catheter in place   Assessment/Plan: 1. Functional deficits which require 3+ hours per day of interdisciplinary therapy in a comprehensive inpatient rehab setting. Physiatrist is providing close team supervision and 24 hour management of active medical problems listed below. Physiatrist and rehab team continue to assess barriers to discharge/monitor patient progress toward functional and medical goals  Care Tool:  Bathing    Body parts bathed by patient:  Right arm, Left upper leg, Left arm, Right lower leg, Chest, Abdomen, Left lower leg, Front perineal area, Buttocks, Face, Right upper leg         Bathing assist Assist Level: Minimal Assistance - Patient > 75%     Upper Body Dressing/Undressing Upper body dressing   What is the patient wearing?: Pull over shirt    Upper body assist Assist Level: Set up assist    Lower Body Dressing/Undressing Lower body dressing      What is the patient wearing?: Underwear/pull up, Pants     Lower body assist Assist for lower body dressing: Minimal Assistance - Patient > 75%     Toileting Toileting    Toileting assist Assist for toileting: Minimal Assistance - Patient > 75%     Transfers Chair/bed transfer  Transfers assist     Chair/bed transfer assist level: Minimal Assistance - Patient > 75%     Locomotion Ambulation   Ambulation assist      Assist level: Minimal Assistance - Patient > 75% Assistive device: Walker-rolling Max distance: 60 ft   Walk 10 feet activity   Assist     Assist level: Contact Guard/Touching assist Assistive device: Walker-rolling   Walk 50  feet activity   Assist    Assist level: Minimal Assistance - Patient > 75% Assistive device: Walker-rolling    Walk 150 feet activity   Assist Walk 150 feet activity did not occur: Safety/medical concerns         Walk 10 feet on uneven surface  activity   Assist Walk 10 feet on uneven surfaces activity did not occur: Safety/medical concerns         Wheelchair     Assist Is the patient using a wheelchair?: Yes Type of Wheelchair: Manual    Wheelchair assist level: Minimal Assistance - Patient > 75%      Wheelchair 50 feet with 2 turns activity    Assist        Assist Level: Minimal Assistance - Patient > 75%   Wheelchair 150 feet activity     Assist      Assist Level: Contact Guard/Touching assist   Blood pressure (!) 112/53, pulse 76, temperature  98.5 F (36.9 C), temperature source Oral, resp. rate 17, height 5\' 10"  (1.778 m), weight 91 kg, SpO2 95 %.    Medical Problem List and Plan: 1. 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  Antithrombotics: -DVT/anticoagulation:  Pharmaceutical: Lovenox             -antiplatelet therapy: N/A 3. Pain Management:  tylenol prn. Gabapentin increase from 300mg  to 400mg  TID for neuropathy. 4. Mood/Behavior/Sleep: LCSW to follow for evaluation and support.              --trazodone prn for insomnia.              -antipsychotic agents: N/A 5. Neuropsych/cognition: This patient is capable of making decisions on his own behalf. 6. Skin/Wound Care: Routine pressure relief measures.  7. Fluids/Electrolytes/Nutrition: Monitor I/O.  --Check CMET in am.  8. HTN: Monitor BP TID-currently hypotensive: d/c Cozaar 9. T2DM: Hgb A1C-6.5 and new diagnosis w/last admisison? Will recheck fasting level. --will add CM restrictions. Monitor BS ac/hs X 48 hours and d/c if controlled.  10. AAA. Outpatient f/u 11. Emphysema. No signs of exacerbation at this time.  12. LE muscle soreness: kpad added  LOS: 1 days A FACE TO FACE EVALUATION WAS PERFORMED  Joe Greene Joe Greene 11/03/2022, 4:00 PM

## 2022-11-03 NOTE — Plan of Care (Signed)
  Problem: RH Balance Goal: LTG Patient will maintain dynamic sitting balance (PT) Description: LTG:  Patient will maintain dynamic sitting balance with assistance during mobility activities (PT) Flowsheets (Taken 11/03/2022 1336) LTG: Pt will maintain dynamic sitting balance during mobility activities with:: Independent Goal: LTG Patient will maintain dynamic standing balance (PT) Description: LTG:  Patient will maintain dynamic standing balance with assistance during mobility activities (PT) Flowsheets (Taken 11/03/2022 1336) LTG: Pt will maintain dynamic standing balance during mobility activities with:: Contact Guard/Touching assist   Problem: Sit to Stand Goal: LTG:  Patient will perform sit to stand with assistance level (PT) Description: LTG:  Patient will perform sit to stand with assistance level (PT) Flowsheets (Taken 11/03/2022 1336) LTG: PT will perform sit to stand in preparation for functional mobility with assistance level: Supervision/Verbal cueing   Problem: RH Bed Mobility Goal: LTG Patient will perform bed mobility with assist (PT) Description: LTG: Patient will perform bed mobility with assistance, with/without cues (PT). Flowsheets (Taken 11/03/2022 1336) LTG: Pt will perform bed mobility with assistance level of: Independent   Problem: RH Bed to Chair Transfers Goal: LTG Patient will perform bed/chair transfers w/assist (PT) Description: LTG: Patient will perform bed to chair transfers with assistance (PT). Flowsheets (Taken 11/03/2022 1336) LTG: Pt will perform Bed to Chair Transfers with assistance level: Supervision/Verbal cueing   Problem: RH Car Transfers Goal: LTG Patient will perform car transfers with assist (PT) Description: LTG: Patient will perform car transfers with assistance (PT). Flowsheets (Taken 11/03/2022 1336) LTG: Pt will perform car transfers with assist:: Supervision/Verbal cueing   Problem: RH Ambulation Goal: LTG Patient will ambulate  in controlled environment (PT) Description: LTG: Patient will ambulate in a controlled environment, # of feet with assistance (PT). Flowsheets (Taken 11/03/2022 1336) LTG: Pt will ambulate in controlled environ  assist needed:: Independent with assistive device LTG: Ambulation distance in controlled environment: up to 150 ft using LRAD Goal: LTG Patient will ambulate in home environment (PT) Description: LTG: Patient will ambulate in home environment, # of feet with assistance (PT). Flowsheets (Taken 11/03/2022 1336) LTG: Pt will ambulate in home environ  assist needed:: Supervision/Verbal cueing LTG: Ambulation distance in home environment: at least 50 ft with LRAD   Problem: RH Wheelchair Mobility Goal: LTG Patient will propel w/c in controlled environment (PT) Description: LTG: Patient will propel wheelchair in controlled environment, # of feet with assist (PT) Flowsheets (Taken 11/03/2022 1336) LTG: Pt will propel w/c in controlled environ  assist needed:: Supervision/Verbal cueing LTG: Propel w/c distance in controlled environment: 150 ft Goal: LTG Patient will propel w/c in home environment (PT) Description: LTG: Patient will propel wheelchair in home environment, # of feet with assistance (PT). Flowsheets (Taken 11/03/2022 1336) Distance: wheelchair distance in controlled environment: 150 LTG: Propel w/c distance in home environment: up to 50 ft including all turns and obstacle clearance   Problem: RH Stairs Goal: LTG Patient will ambulate up and down stairs w/assist (PT) Description: LTG: Patient will ambulate up and down # of stairs with assistance (PT) Flowsheets (Taken 11/03/2022 1336) LTG: Pt will ambulate up/down stairs assist needed:: Contact Guard/Touching assist LTG: Pt will  ambulate up and down number of stairs: at least 2 steps for improved community ambulation

## 2022-11-03 NOTE — Evaluation (Addendum)
Occupational Therapy Assessment and Plan  Patient Details  Name: Joe Greene MRN: [id number] Date of Birth: 1950-12-23  OT Diagnosis: ataxia and muscle weakness (generalized) Rehab Potential: Rehab Potential (ACUTE ONLY): Good ELOS: 7-10 days  Today's Date: 11/03/2022 OT Individual Time: 4650-3546 OT Individual Time Calculation (min): 71 min     Hospital Problem: Principal Problem:   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[place])   Past Medical History:  Past Medical History:  Diagnosis Date   AAA (abdominal aortic aneurysm) ([place])    Cataract    forming    Past Surgical History:  Past Surgical History:  Procedure Laterality Date   FOOT SURGERY Right    HERNIA REPAIR Right    IR FLUORO GUIDE CV LINE RIGHT  10/25/2022   IR US GUIDE VASC ACCESS RIGHT  10/25/2022   KNEE ARTHROSCOPY Left    WISDOM TOOTH EXTRACTION      Assessment & Plan Clinical Impression: Joe Greene is a 71 year old male with history of URI with cough/loss of taste followed by progressive numbness UE/LE with weakness and GBS diagnosis on admission 09/24/22. He was treated with IVIG X 5 days and d/c to home walking without assistance. He started developing weakness 11/27 with sensory deficits from waist down and difficulty walking. He was evaluated by Dr. 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 Rhea Belton office for treatment. Central line placed on 12/14 and he was started on IVIG with improvement in symptoms. He completes 5/5 exchange today and reports improvement in sensory deficits--now has numbness in feet and bilateral palms. NIF-32 and vital capacity 2.7L.  Neurology does not plan for further plasma exchange, plan for catheter removal. PT/OT has been working with patient who continues to be limited by neuropathy with sensory deficits, balance deficits and decrease in BUE [facility]. CIR recommended due to functional decline.  Patient  transferred to CIR on 11/02/2022 .    Patient currently requires min with basic self-care skills secondary to muscle weakness, decreased cardiorespiratoy endurance, ataxia and decreased coordination, and decreased standing balance, decreased postural control, and decreased balance strategies.  Prior to hospitalization, patient could complete ADLs with independent .  Patient will benefit from skilled intervention to increase independence with basic self-care skills prior to discharge home with care partner.  Anticipate patient will require intermittent supervision and follow up outpatient.  OT - End of Session Activity Tolerance: Tolerates 10 - 20 min activity with multiple rests Endurance Deficit: Yes Endurance Deficit Description: generalized weakness OT Assessment Rehab Potential (ACUTE ONLY): Good OT Patient demonstrates impairments in the following area(s): Balance;Safety;Sensory;Endurance;Motor;Pain OT Basic ADL's Functional Problem(s): Bathing;Dressing;Toileting OT Transfers Functional Problem(s): Toilet;Tub/Shower OT Additional Impairment(s): None OT Plan OT Intensity: Minimum of 1-2 x/day, 45 to 90 minutes OT Frequency: 5 out of 7 days OT Duration/Estimated Length of Stay: 5-7 days OT Treatment/Interventions: Balance/vestibular training;Pain management;UE/LE Coordination activities;Therapeutic Activities;Self Care/advanced ADL retraining;Functional mobility training;Patient/family education;Therapeutic Exercise;DME/adaptive equipment instruction;Community reintegration;Psychosocial support;UE/LE Strength taining/ROM;Discharge planning;Neuromuscular re-education OT Self Feeding Anticipated Outcome(s): no goal OT Basic Self-Care Anticipated Outcome(s): mod I OT Toileting Anticipated Outcome(s): mod I OT Bathroom Transfers Anticipated Outcome(s): mod I OT Recommendation Patient destination: Home Follow Up Recommendations: Outpatient OT Equipment Recommended: None recommended by  OT   OT Evaluation Precautions/Restrictions  Precautions Precautions: Fall Restrictions Weight Bearing Restrictions: No General Chart Reviewed: Yes Family/Caregiver Present: Yes   Home Living/Prior Functioning Home Living Family/patient expects to be discharged to:: Private residence Living  Arrangements: Spouse/significant other Available Help at Discharge: Family, Available 24 hours/day Type of Home: House Home Access: Ramped entrance Home Layout: Able to live on main level with bedroom/bathroom Bathroom Shower/Tub: Multimedia programmer: Standard Bathroom Accessibility: Yes Additional Comments: DME was wife's after prior hospitalization  Lives With: Spouse IADL History Homemaking Responsibilities: Yes Meal Prep Responsibility: Secondary Laundry Responsibility: Secondary Cleaning Responsibility: Secondary Bill Paying/Finance Responsibility: Secondary Shopping Responsibility: Secondary Current License: Yes Mode of Transportation: Car Occupation: Retired Type of Occupation: [employer], gardens. Retired Wellsite geologist at [employer] Function Level of Independence: Independent with basic ADLs, Independent with transfers, Independent with gait Driving: Yes Vocation: Retired Leisure: Hobbies-yes (Comment) Vision Baseline Vision/History: 0 No visual deficits;1 Wears glasses Ability to See in Adequate Light: 0 Adequate Patient Visual Report: No change from baseline Vision Assessment?: No apparent visual deficits Perception  Perception: Within Functional Limits Praxis Praxis: Intact Cognition Cognition Overall Cognitive Status: Within Functional Limits for tasks assessed Arousal/Alertness: Awake/alert Memory: Appears intact Awareness: Appears intact Problem Solving: Appears intact Safety/Judgment: Appears intact Brief Interview for Mental Status (BIMS) Repetition of Three Words (First Attempt): 3 Temporal Orientation: Year: Correct Temporal Orientation: Month:  Accurate within 5 days Temporal Orientation: Day: Correct Recall: "Sock": Yes, no cue required Recall: "Blue": Yes, no cue required Recall: "Bed": Yes, no cue required BIMS Summary Score: 15 Sensation Sensation Light Touch: Impaired Detail Central sensation comments: Impaired sensation distal to PIP's in B hands. Impaired in feet bilaterally and up shins. Able to localize some sensation in shins Light Touch Impaired Details: Impaired LLE;Impaired RLE;Impaired LUE;Impaired RUE Stereognosis: Appears Intact Coordination Gross Motor Movements are Fluid and Coordinated: No Fine Motor Movements are Fluid and Coordinated: No Coordination and Movement Description: Sensation changes in BLE affect coordination grossly 9 Hole Peg Test: L:33.7 sec R: 33.5 Motor  Motor Motor: Ataxia Motor - Skilled Clinical Observations: Generalized ataxia d/t sensation changes in LE  Trunk/Postural Assessment  Cervical Assessment Cervical Assessment: Within Functional Limits Thoracic Assessment Thoracic Assessment: Within Functional Limits Lumbar Assessment Lumbar Assessment: Within Functional Limits Postural Control Postural Control: Deficits on evaluation Righting Reactions: Delayed  Balance Balance Balance Assessed: Yes Static Sitting Balance Static Sitting - Balance Support: Feet supported Static Sitting - Level of Assistance: 6: Modified independent (Device/Increase time) Dynamic Sitting Balance Dynamic Sitting - Balance Support: Feet supported Dynamic Sitting - Level of Assistance: 6: Modified independent (Device/Increase time) Static Standing Balance Static Standing - Balance Support: During functional activity;Bilateral upper extremity supported Static Standing - Level of Assistance: 4: Min assist Dynamic Standing Balance Dynamic Standing - Balance Support: During functional activity;Bilateral upper extremity supported Dynamic Standing - Level of Assistance: 4: Min assist Extremity/Trunk  Assessment RUE Assessment RUE Assessment: Exceptions to [facility] General Strength Comments: 65 lb grip strength LUE Assessment LUE Assessment: Exceptions to [facility] General Strength Comments: 85 lb grip strength  Care Tool Care Tool Self Care Eating   Eating Assist Level: Independent with assistive device    Oral Care    Oral Care Assist Level: Supervision/Verbal cueing    Bathing   Body parts bathed by patient: Right arm;Left upper leg;Left arm;Right lower leg;Chest;Abdomen;Left lower leg;Front perineal area;Buttocks;Face;Right upper leg     Assist Level: Minimal Assistance - Patient > 75%    Upper Body Dressing(including orthotics)   What is the patient wearing?: Pull over shirt   Assist Level: Set up assist    Lower Body Dressing (excluding footwear)   What is the patient wearing?: Underwear/pull up;Pants Assist for lower body dressing: Minimal  Assistance - Patient > 75%    Putting on/Taking off footwear   What is the patient wearing?: Non-skid slipper socks;Socks Assist for footwear: Contact Guard/Touching assist       Care Tool Toileting Toileting activity   Assist for toileting: Minimal Assistance - Patient > 75%     Care Tool Bed Mobility Roll left and right activity   Roll left and right assist level: Supervision/Verbal cueing    Sit to lying activity   Sit to lying assist level: Supervision/Verbal cueing    Lying to sitting on side of bed activity   Lying to sitting on side of bed assist level: the ability to move from lying on the back to sitting on the side of the bed with no back support.: Supervision/Verbal cueing     Care Tool Transfers Sit to stand transfer   Sit to stand assist level: Minimal Assistance - Patient > 75%    Chair/bed transfer   Chair/bed transfer assist level: Minimal Assistance - Patient > 75%     Toilet transfer   Assist Level: Minimal Assistance - Patient > 75%     Care Tool Cognition  Expression of Ideas and Wants Expression  of Ideas and Wants: 4. Without difficulty (complex and basic) - expresses complex messages without difficulty and with speech that is clear and easy to understand  Understanding Verbal and Non-Verbal Content Understanding Verbal and Non-Verbal Content: 4. Understands (complex and basic) - clear comprehension without cues or repetitions   Memory/Recall Ability Memory/Recall Ability : Current season;Location of own room;Staff names and faces;That he or she is in a hospital/hospital unit   Refer to Care Plan for [place] 1 OT Short Term Goal 1 (Week 1): STG= LTG d/t ELOS  Recommendations for other services: None    Skilled Therapeutic Intervention ADL   Mobility  Bed Mobility Bed Mobility: Sit to Supine;Supine to Sit Supine to Sit: Supervision/Verbal cueing Sit to Supine: Supervision/Verbal cueing Transfers Sit to Stand: Minimal Assistance - Patient > 75% Stand to Sit: Minimal Assistance - Patient > 75%   Skilled OT evaluation completed with the creation of pt centered OT POC. Pt educated on condition, ELOS, rehab expectations, and fall risk reduction strategies throughout session. Pt's wife [name] present during session and supportive. ADLs simulated d/t pt not being able to shower from port being removed yesterday and not needing to wash up/being fully dressed. Pt completed 150 ft of functional mobility ot the the therapy gym with the RW at [facility] level. Completed testing as reported above. 50 ft x3 repetitions without the RW with min-mod A overall. He completed 2x8 repetitions sit <> stand without UE support. Min A overall required with poor posterior chain control. He returned to his room and was left sitting up with all needs met.    Discharge Criteria: Patient will be discharged from OT if patient refuses treatment 3 consecutive times without medical reason, if treatment goals not met, if there is a change in medical status, if patient makes no progress  towards goals or if patient is discharged from hospital.  The above assessment, treatment plan, treatment alternatives and goals were discussed and mutually agreed upon: by patient and by family  Curtis Sites 11/03/2022, 8:27 AM

## 2022-11-03 NOTE — Evaluation (Signed)
Physical Therapy Assessment and Plan  Patient Details  Name: Joe Greene MRN: [id number] Date of Birth: 06/10/1951  PT Diagnosis: {diagnoses:3041673} Rehab Potential: Fair ELOS: 10 days   Today's Date: 11/03/2022 PT Individual Time: 0923-3007 PT Individual Time Calculation (min): 47 min    Hospital Problem: Principal Problem:   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[place])   Past Medical History:  Past Medical History:  Diagnosis Date   AAA (abdominal aortic aneurysm) ([place])    Cataract    forming    Past Surgical History:  Past Surgical History:  Procedure Laterality Date   FOOT SURGERY Right    HERNIA REPAIR Right    IR FLUORO GUIDE CV LINE RIGHT  10/25/2022   IR US GUIDE VASC ACCESS RIGHT  10/25/2022   KNEE ARTHROSCOPY Left    WISDOM TOOTH EXTRACTION      Assessment & Plan Clinical Impression: Patient is a 71 y.o. year old male with recent admission to the hospital on *** with ***.  Patient transferred to CIR on 11/02/2022 .   Patient currently requires {MAU:6333545} with mobility secondary to {impairments:3041632}.  Prior to hospitalization, patient was {GYB:6389373} with mobility and lived with Spouse in a House home.  Home access is  Ramped entrance.  Patient will benefit from skilled PT intervention to {benefits:22816} for planned discharge {planned discharge:3041670}.  Anticipate patient will {follow SK:8768115} at discharge.  PT - End of Session Activity Tolerance: Tolerates 30+ min activity with multiple rests Endurance Deficit: Yes Endurance Deficit Description: generalized weakness PT Assessment Rehab Potential (ACUTE/IP ONLY): Fair PT Barriers to Discharge: Decreased caregiver support;Home environment access/layout;Neurogenic Bowel & Bladder;Insurance for SNF coverage;Nutrition means PT Patient demonstrates impairments in the following area(s): Balance;Behavior;Edema;Endurance;Motor;Nutrition;Pain;Skin Integrity;Sensory PT Transfers Functional Problem(s): Bed  Mobility;Bed to Chair;Car;Furniture PT Locomotion Functional Problem(s): Wheelchair Mobility;Ambulation;Stairs PT Plan PT Intensity: Minimum of 1-2 x/day ,45 to 90 minutes PT Frequency: 5 out of 7 days PT Duration Estimated Length of Stay: 10 days PT Treatment/Interventions: Ambulation/gait training;Community reintegration;DME/adaptive equipment instruction;Neuromuscular re-education;Psychosocial support;Stair training;Wheelchair propulsion/positioning;UE/LE Strength taining/ROM;UE/LE Coordination activities;Therapeutic Activities;Skin care/wound management;Pain management;Functional electrical stimulation;Discharge planning;Balance/vestibular training;Cognitive remediation/compensation;Disease management/prevention;Functional mobility training;Patient/family education;Splinting/orthotics;Therapeutic Exercise;Visual/perceptual remediation/compensation PT Transfers Anticipated Outcome(s): Mod I PT Locomotion Anticipated Outcome(s): supervision PT Recommendation Recommendations for Other Services: Speech consult Follow Up Recommendations: Home health PT;24 hour supervision/assistance Patient destination: Home Equipment Recommended: To be determined   PT Evaluation Precautions/Restrictions Precautions Precautions: Fall Precaution Comments: 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 Rarely or not at all Pain Interference  with Therapy Activities: 1. Rarely or not at all Pain Interference with Day-to-Day Activities: 1. Rarely or not at all Home Living/Prior [place] Available Help at Discharge: Family;Available 24 hours/day Type of Home: House Home Access: Ramped entrance Home Layout: One level Bathroom Shower/Tub: Walk-in shower (4" threshold) Bathroom Toilet: Standard Bathroom Accessibility: Yes Additional Comments: DME was wife's after prior hospitalization  Lives With: Spouse Prior Function Level of Independence: Independent with basic ADLs;Independent with transfers;Independent with gait;Independent with homemaking with ambulation  Able to  Take Stairs?: Yes Driving: Yes Vocation: Retired Vision/Perception  Vision - History Ability to See in Adequate Light: 0 Adequate Perception Perception: Within Functional Limits Praxis Praxis: Intact  Cognition Overall Cognitive Status: Within Functional Limits for tasks assessed Arousal/Alertness: Awake/alert Orientation Level: Oriented X4 Memory: Appears intact Awareness: Appears intact Problem Solving: Appears intact Safety/Judgment: Appears intact Sensation Sensation Light Touch: Impaired Detail Central sensation comments: Impaired sensation distal to PIP's in B hands. Impaired in feet bilaterally and up shins. Able to localize some sensation in shins Light Touch Impaired Details: Impaired LLE;Impaired RLE;Impaired LUE;Impaired RUE Coordination Gross Motor Movements are Fluid and Coordinated: No Fine Motor Movements are Fluid and Coordinated: No Coordination and Movement Description: Sensation changes in BLE affect coordination grossly Motor  Motor Motor: Ataxia Motor - Skilled Clinical Observations: Generalized ataxia d/t sensation changes in LE   Trunk/Postural Assessment  Cervical Assessment Cervical Assessment: Exceptions to [facility] (forward head) Thoracic Assessment Thoracic Assessment: Exceptions to [facility] (rounded  shoulders) Lumbar Assessment Lumbar Assessment: Within Functional Limits Postural Control Postural Control: Deficits on evaluation Righting Reactions: Delayed  Balance Balance Balance Assessed: Yes Standardized Balance Assessment Standardized Balance Assessment: Berg Balance Test Berg Balance Test Sit to Stand: Able to stand  independently using hands Standing Unsupported: Able to stand 2 minutes with supervision Sitting with Back Unsupported but Feet Supported on Floor or Stool: Able to sit safely and securely 2 minutes Stand to Sit: Controls descent by using hands Transfers: Able to transfer with verbal cueing and /or supervision Standing Unsupported with Eyes Closed: Able to stand 3 seconds Standing Ubsupported with Feet Together: Needs help to attain position but able to stand for 30 seconds with feet together From Standing, Reach Forward with Outstretched Arm: Can reach forward >12 cm safely (5") From Standing Position, Pick up Object from Floor: Unable to pick up and needs supervision From Standing Position, Turn to Look Behind Over each Shoulder: Needs supervision when turning Turn 360 Degrees: Needs assistance while turning Standing Unsupported, Alternately Place Feet on Step/Stool: Needs assistance to keep from falling or unable to try Standing Unsupported, One Foot in Front: Able to take small step independently and hold 30 seconds Standing on One Leg: Tries to lift leg/unable to hold 3 seconds but remains standing independently Total Score: 26 Static Sitting Balance Static Sitting - Balance Support: Feet supported Static Sitting - Level of Assistance: 6: Modified independent (Device/Increase time) Dynamic Sitting Balance Dynamic Sitting - Balance Support: Feet supported Dynamic Sitting - Level of Assistance: 6: Modified independent (Device/Increase time);5: Stand by assistance Dynamic Sitting - Balance Activities: Reaching for objects;Reaching across midline Static  Standing Balance Static Standing - Balance Support: During functional activity;Bilateral upper extremity supported Static Standing - Level of Assistance: 4: Min assist Dynamic Standing Balance Dynamic Standing - Balance Support: During functional activity;Bilateral upper extremity supported Dynamic Standing - Level of Assistance: 4: Min assist Extremity Assessment      RLE Assessment RLE Assessment: Exceptions to [facility] Active Range of Motion (AROM) Comments: uncoordinated/ ataxic movements RLE Strength RLE Overall Strength: Deficits Right Hip Flexion: 4-/5 Right Hip Extension: 3+/5 Right Hip ABduction: 3+/5 (ataxic/ jumpy movements) Right Hip ADduction: 3+/5 Right Knee Flexion: 3+/5 Right Knee Extension: 4/5 Right Ankle Dorsiflexion: 4/5 Right Ankle Plantar Flexion: 4-/5 LLE Assessment LLE Assessment: Exceptions to WFL LLE Strength LLE Overall Strength: Deficits Left Hip Flexion: 4/5 Left Hip Extension: 4-/5 Left Hip ABduction: 4-/5 Left Hip ADduction: 4-/5 Left Knee Flexion: 3+/5 Left Knee Extension: 3+/5 Left Ankle Dorsiflexion: 4+/5 Left Ankle  Plantar Flexion: 4/5  Care Tool Care Tool Bed Mobility Roll left and right activity   Roll left and right assist level: Supervision/Verbal cueing    Sit to lying activity   Sit to lying assist level: Supervision/Verbal cueing    Lying to sitting on side of bed activity   Lying to sitting on side of bed assist level: the ability to move from lying on the back to sitting on the side of the bed with no back support.: Supervision/Verbal cueing     Care Tool Transfers Sit to stand transfer   Sit to stand assist level: Minimal Assistance - Patient > 75%    Chair/bed transfer   Chair/bed transfer assist level: Minimal Assistance - Patient > 75%     Toilet transfer   Assist Level: Minimal Assistance - Patient > 75%    Car transfer   Car transfer assist level: Minimal Assistance - Patient > 75%      Care Tool  Locomotion Ambulation   Assist level: Minimal Assistance - Patient > 75% Assistive device: Walker-rolling Max distance: 60 ft  Walk 10 feet activity   Assist level: Contact Guard/Touching assist Assistive device: Walker-rolling   Walk 50 feet with 2 turns activity   Assist level: Minimal Assistance - Patient > 75% Assistive device: Walker-rolling  Walk 150 feet activity Walk 150 feet activity did not occur: Safety/medical concerns      Walk 10 feet on uneven surfaces activity Walk 10 feet on uneven surfaces activity did not occur: Safety/medical concerns      Stairs     Stairs assistive device: Walker Max number of stairs: 2  Walk up/down 1 step activity   Walk up/down 1 step (curb) assist level: Contact Guard/Touching assist    Walk up/down 4 steps activity Walk up/down 4 steps activity did not occur: Safety/medical concerns      Walk up/down 12 steps activity Walk up/down 12 steps activity did not occur: Safety/medical concerns      Pick up small objects from floor   Pick up small object from the floor assist level: Maximal Assistance - Patient 25 - 49%    Wheelchair Is the patient using a wheelchair?: Yes Type of Wheelchair: Manual   Wheelchair assist level: Minimal Assistance - Patient > 75%    Wheel 50 feet with 2 turns activity   Assist Level: Minimal Assistance - Patient > 75%  Wheel 150 feet activity   Assist Level: Contact Guard/Touching assist    Refer to Care Plan for Long Term Goals  SHORT TERM GOAL WEEK 1 PT Short Term Goal 1 (Week 1): Pt will perform standing transfers with consistent CGA. PT Short Term Goal 2 (Week 1): Pt will perform standing activities with minimal physical assistance and for at least 5 min. PT Short Term Goal 3 (Week 1): Pt will manage w/c parts with CGA/ supervision assst  Recommendations for other services: {RECOMMENDATIONS FOR OTHER SERVICES:3049016}  Skilled Therapeutic Intervention Mobility Bed Mobility Bed Mobility: Sit  to Supine;Supine to Sit Supine to Sit: Supervision/Verbal cueing Sit to Supine: Supervision/Verbal cueing Transfers Transfers: Sit to Stand;Stand to Sit;Stand Pivot Transfers Sit to Stand: Minimal Assistance - Patient > 75% Stand to Sit: Minimal Assistance - Patient > 75% Stand Pivot Transfers: Minimal Assistance - Patient > 75% Stand Pivot Transfer Details: Tactile cues for posture;Verbal cues for technique;Verbal cues for precautions/safety;Verbal cues for safe use of DME/AE Transfer (Assistive device): Rolling walker Locomotion  Gait Ambulation: Yes Gait Assistance: Minimal Assistance - Patient >  75% Gait Distance (Feet): 50 Feet Assistive device: Rolling walker Gait Assistance Details: Tactile cues for posture;Tactile cues for weight shifting;Verbal cues for precautions/safety;Verbal cues for safe use of DME/AE Gait Gait: Yes Gait Pattern:  (light, intermittent ataxia, flexed posture, Bil foot neuropathy) Stairs / Additional Locomotion Stairs: Yes Stairs Assistance: Minimal Assistance - Patient > 75% Stair Management Technique: With walker Number of Stairs: 2 Height of Stairs: 6 Ramp: Contact Guard/touching assist Wheelchair Mobility Wheelchair Mobility: Yes Wheelchair Assistance: Development worker, international aid: Both upper extremities Wheelchair Parts Management: Needs assistance Distance: 130 ft   Discharge Criteria: Patient will be discharged from PT if patient refuses treatment 3 consecutive times without medical reason, if treatment goals not met, if there is a change in medical status, if patient makes no progress towards goals or if patient is discharged from hospital.  The above assessment, treatment plan, treatment alternatives and goals were discussed and mutually agreed upon: {Assessment/Treatment Plan Discussed/Agreed:3049017}  Alger Simons 11/03/2022, 1:47 PM

## 2022-11-04 DIAGNOSIS — G610 Guillain-Barre syndrome: Secondary | ICD-10-CM | POA: Diagnosis not present

## 2022-11-04 LAB — GLUCOSE, CAPILLARY
Glucose-Capillary: 100 mg/dL — ABNORMAL HIGH (ref 70–99)
Glucose-Capillary: 100 mg/dL — ABNORMAL HIGH (ref 70–99)

## 2022-11-04 NOTE — Progress Notes (Signed)
Physical Therapy Session Note  Patient Details  Name: Rakeen Gaillard MRN: [id number] Date of Birth: 1951-03-24  Today's Date: 11/04/2022 PT Individual Time: 1305-1430 PT Individual Time Calculation (min): 85 min   Short Term Goals: Week 1:  PT Short Term Goal 1 (Week 1): Pt will perform standing transfers with consistent CGA. PT Short Term Goal 2 (Week 1): Pt will perform standing activities with minimal physical assistance and for at least 5 min. PT Short Term Goal 3 (Week 1): Pt will manage w/c parts with CGA/ supervision assst  Skilled Therapeutic Interventions/Progress Updates: Pt presented in recliner agreeable to therapy. Pt denies pain during session. Pt stood with CGA and ambulated to main gym with CGA overall. Pt noted to have mildly increased ataxia with fatigue. Pt then participated in standing dynamic balance activities including standing and using rebounder 2 x 15 with 1Kg weighted ball. Pt also participated in Sit to stand in bouts of 5 without AD then maintaining static stand ~15 sec for balance. On last round pt performed while standing on airex x 5 with pt able to perform stand with CGA but requiring minA initially to maintain balance. Pt then participated in standing balance while placing clothespins on basketball net performing reaching with min to moderate challenges. Pt also performed task with single LE on 2 in block. Pt also performed toe taps to target on 6in block for increased coordination challenge. Pt also performed toe taps to target incorporating crossing midline and multiple taps. Pt provided with multiple rest breaks throughout activities for energy conservation. Pt then participated in obstacle course incorporating weaving through cones, stepping over obstacles, and stepping onto 4in step. Pt was able to perform 68f x 4 with x 1 occurrence of R knee buckling with pt able to recover without assist. After buckling PTA providing continued education with pt and wife regarding  energy conservation and importance of taking breaks as needed to complete activities. PTA then obtained w/c and pt ambulated back to room with RW and CGA and mild knee instability noted but did not buckle. Pt requesting to use bathroom prior to returning to recliner. Ambulated to toilet and had continent urinary void in standing with CGA. Pt then performed hand hygiene in standing at sink with continued mild knee instability noted. Pt returned to recliner at end of session and left with call bell within reach and current needs met.      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

## 2022-11-04 NOTE — Progress Notes (Signed)
NIF -35 VC 1.9  Pt had good effort.

## 2022-11-04 NOTE — Progress Notes (Signed)
Physical Therapy Session Note  Patient Details  Name: Joe Greene MRN: [id number] Date of Birth: September 04, 1951  Today's Date: 11/04/2022 PT Individual Time: 1000-1054 PT Individual Time Calculation (min): 54 min   Short Term Goals: Week 1:  PT Short Term Goal 1 (Week 1): Pt will perform standing transfers with consistent CGA. PT Short Term Goal 2 (Week 1): Pt will perform standing activities with minimal physical assistance and for at least 5 min. PT Short Term Goal 3 (Week 1): Pt will manage w/c parts with CGA/ supervision assst  Skilled Therapeutic Interventions/Progress Updates:    pt received in bed and agreeable to therapy. No complaint of pain. Supine>sit with supervision. Donned shoes with set up EOB. Pt ambulated with RW 2 x 120-150 ft with RW and CGA, 1 seated rest break. X1 knee buckle that pt was able to correct without assist. Noted ER R>L, pt aware and able to correct with cueing. During seated rest breaks, discussed home set up and pt goals. Pt then navigates both 3" and 6" steps x 8 with BIL handrails and and CGA. Pt able to stand without UE support to rail with CGA. Pt able to ascend/descend with both legs, but discussed that ascending with stronger LLE and descending with weaker RLE will be safer at d/c. Pt states that he feels most unsafe with eccentric lowering BIL. Transitioned to // bars for lateral step downs/heel taps with BUE support on rail. Visual cues to prevent hip drop, which pt was able to feel and work on but not fully correct on 6" step. Pt performed 4 x 6 on R and x 10 on L, to fatigue on RLE. Pt then requested to attempt ramp as that is how he will access his home. Pt able to navigate easily with CGA. Pt propelled w/c with BUE back to room for endurance and functional mobility. ambulatory transfer to recliner with CGA, pt remained with all needs in reach.   Therapy Documentation Precautions:  Precautions Precautions: Fall Precaution Comments: Neuropathy in Bil lower  legs and feet, as well as Bil fingertips Restrictions Weight Bearing Restrictions: No General:       Therapy/Group: Individual Therapy  Joe Greene 11/04/2022, 10:56 AM

## 2022-11-04 NOTE — Progress Notes (Signed)
Occupational Therapy Session Note  Patient Details  Name: Linkin Vizzini MRN: [id number] Date of Birth: [birth year]  Today's Date: 11/04/2022 OT Individual Time: 0865-7846 OT Individual Time Calculation (min): 55 min    Short Term Goals: Week 1:  OT Short Term Goal 1 (Week 1): STG= LTG d/t ELOS  Skilled Therapeutic Interventions/Progress Updates:    Pt resting in bed upon arrival and agreeable to therapy. OT intervention with focus on functional amb with RW, standing balance, sitting balance, bathing at shower level, dressing with sit<>stand from EOB, and FMC strengthening/function to increase independence with BADLs. Amb with RW to bathroom with CGA. Bathing with CGA using grab bars for standing. Dressing with CGA when standing. Pt has red threaputty and HEP. Pt issued small beads to place/remove from putty. Pt reports tips of all BUE digits are tingly. Pt remained in bed with all needs within reach and bed alarm activated.   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

## 2022-11-04 NOTE — Progress Notes (Signed)
PROGRESS NOTE   Subjective/Complaints: Seen ambulating in hallway with therapy Tolerated therapy well today Ambulated 300 feet with RW with CG Has neuropathy in hands and feet    Objective:   No results found. Recent Labs    11/02/22 0445 11/03/22 0600  WBC 8.8 8.2  HGB 14.8 13.7  HCT 42.7 41.2  PLT 185 188   Recent Labs    11/02/22 0445 11/03/22 0600  NA 139 140  K 3.7 3.9  CL 109 110  CO2 22 20*  GLUCOSE 122* 120*  BUN 17 16  CREATININE 0.96 0.93  CALCIUM 9.7 8.6*    Intake/Output Summary (Last 24 hours) at 11/04/2022 1534 Last data filed at 11/04/2022 1302 Gross per 24 hour  Intake 1078 ml  Output 600 ml  Net 478 ml        Physical Exam: Vital Signs Blood pressure 131/73, pulse 74, temperature 98.3 F (36.8 C), temperature source Oral, resp. rate 18, height 5\' 10"  (1.778 m), weight 92.3 kg, SpO2 95 %. Gen: no distress, normal appearing HEENT: oral mucosa pink and moist, NCAT Cardio: Reg rate Chest: normal effort, normal rate of breathing Abd: soft, non-distended Ext: no edema Psych: pleasant, normal affect Skin: intact Neuro:  Alert and oriented x4, follows commands, normal speech and language, CN 2-12 intact FTN intact b/l, HTS decreased b/l Strength 5/5 inb/l UE Strength 4+/5 in b/l LE DTR decreased throughout   Sensation decreased in distal hands and decreased bellow the knee bilaterally, absent sensation at b/l feet to LT Musculoskeletal: No joint swelling or tenderness IV LUE, R IJ catheter in place Ambulating with RW CG   Assessment/Plan: 1. Functional deficits which require 3+ hours per day of interdisciplinary therapy in a comprehensive inpatient rehab setting. Physiatrist is providing close team supervision and 24 hour management of active medical problems listed below. Physiatrist and rehab team continue to assess barriers to discharge/monitor patient progress toward  functional and medical goals  Care Tool:  Bathing    Body parts bathed by patient: Right arm, Left upper leg, Left arm, Right lower leg, Chest, Abdomen, Left lower leg, Front perineal area, Buttocks, Face, Right upper leg         Bathing assist Assist Level: Contact Guard/Touching assist     Upper Body Dressing/Undressing Upper body dressing   What is the patient wearing?: Pull over shirt    Upper body assist Assist Level: Set up assist    Lower Body Dressing/Undressing Lower body dressing      What is the patient wearing?: Pants     Lower body assist Assist for lower body dressing: Contact Guard/Touching assist     Toileting Toileting    Toileting assist Assist for toileting: Minimal Assistance - Patient > 75%     Transfers Chair/bed transfer  Transfers assist     Chair/bed transfer assist level: Minimal Assistance - Patient > 75%     Locomotion Ambulation   Ambulation assist      Assist level: Minimal Assistance - Patient > 75% Assistive device: Walker-rolling Max distance: 60 ft   Walk 10 feet activity   Assist     Assist level: Contact Guard/Touching assist Assistive  device: Walker-rolling   Walk 50 feet activity   Assist    Assist level: Minimal Assistance - Patient > 75% Assistive device: Walker-rolling    Walk 150 feet activity   Assist Walk 150 feet activity did not occur: Safety/medical concerns         Walk 10 feet on uneven surface  activity   Assist Walk 10 feet on uneven surfaces activity did not occur: Safety/medical concerns         Wheelchair     Assist Is the patient using a wheelchair?: Yes Type of Wheelchair: Manual    Wheelchair assist level: Minimal Assistance - Patient > 75%      Wheelchair 50 feet with 2 turns activity    Assist        Assist Level: Minimal Assistance - Patient > 75%   Wheelchair 150 feet activity     Assist      Assist Level: Contact Guard/Touching  assist   Blood pressure 131/73, pulse 74, temperature 98.3 F (36.8 C), temperature source Oral, resp. rate 18, height 5\' 10"  (1.778 m), weight 92.3 kg, SpO2 95 %.    Medical Problem List and Plan: 1. 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  Antithrombotics: -DVT/anticoagulation:  Pharmaceutical: Lovenox             -antiplatelet therapy: N/A 3. Pain Management: continue tylenol prn. Gabapentin increase from 300mg  to 400mg  TID for neuropathy. 4. Mood/Behavior/Sleep: LCSW to follow for evaluation and support.              --trazodone prn for insomnia.              -antipsychotic agents: N/A 5. Neuropsych/cognition: This patient is capable of making decisions on his own behalf. 6. Skin/Wound Care: Routine pressure relief measures.  7. Fluids/Electrolytes/Nutrition: Monitor I/O.  --Check CMET in am.  8. HTN: Monitor BP TID-currently hypotensive: d/c Cozaar. Now normotensive.  9. T2DM: Hgb A1C-6.5 and new diagnosis w/last admisison? Will recheck fasting level. --will add CM restrictions. Will d/c CBGs since in low 100s 10. AAA. Outpatient f/u 11. Emphysema. No signs of exacerbation at this time.  12. LE muscle soreness: kpad added, continue prn  LOS: 2 days A FACE TO FACE EVALUATION WAS PERFORMED  Clide Deutscher Lu [place] 11/04/2022, 3:34 PM

## 2022-11-05 DIAGNOSIS — G610 Guillain-Barre syndrome: Secondary | ICD-10-CM | POA: Diagnosis not present

## 2022-11-05 DIAGNOSIS — I10 Essential (primary) hypertension: Secondary | ICD-10-CM | POA: Diagnosis not present

## 2022-11-05 LAB — GLUCOSE, CAPILLARY: Glucose-Capillary: 105 mg/dL — ABNORMAL HIGH (ref 70–99)

## 2022-11-05 NOTE — Progress Notes (Signed)
Good pt effort. NIF > -50cmH2O VC 1.98L

## 2022-11-05 NOTE — Progress Notes (Signed)
Uneventful night thus far. PRN tylenol given at 2103, for generalized discomfort.Wife at bedside, staying night. LBM 12/24. Right neck with foam dressing, old HD cath site. Joe Greene A

## 2022-11-05 NOTE — Progress Notes (Signed)
PROGRESS NOTE   Subjective/Complaints: No new concerns or complaints today.  Numbness in his hands is improving.    ROS: Denies CP, SOB, N/V/D, vision changes  Objective:   No results found. Recent Labs    11/03/22 0600  WBC 8.2  HGB 13.7  HCT 41.2  PLT 188    Recent Labs    11/03/22 0600  NA 140  K 3.9  CL 110  CO2 20*  GLUCOSE 120*  BUN 16  CREATININE 0.93  CALCIUM 8.6*     Intake/Output Summary (Last 24 hours) at 11/05/2022 1235 Last data filed at 11/05/2022 6063 Gross per 24 hour  Intake 1656 ml  Output --  Net 1656 ml         Physical Exam: Vital Signs Blood pressure 104/76, pulse 65, temperature 98 F (36.7 C), temperature source Oral, resp. rate 16, height 5\' 10"  (1.778 m), weight 92.3 kg, SpO2 96 %. Gen: no distress, normal appearing HEENT: oral mucosa pink and moist, NCAT Cardio: Reg rate Chest: CTAB, normal effort, normal rate of breathing Abd: soft, non-distended Ext: no edema Psych: pleasant, normal affect Skin: intact Neuro:  Alert and oriented x4, follows commands, normal speech and language, CN 2-12 grossly intact FTN intact b/l, HTS decreased b/l Strength 5/5 inb/l UE Strength 4+/5 in b/l LE DTR decreased throughout   Sensation decreased in distal hands and decreased bellow the knee bilaterally, absent sensation at b/l feet to LT Musculoskeletal: No joint swelling or tenderness IV LUE, R IJ catheter in place Ambulating with RW CG   Assessment/Plan: 1. Functional deficits which require 3+ hours per day of interdisciplinary therapy in a comprehensive inpatient rehab setting. Physiatrist is providing close team supervision and 24 hour management of active medical problems listed below. Physiatrist and rehab team continue to assess barriers to discharge/monitor patient progress toward functional and medical goals  Care Tool:  Bathing    Body parts bathed by patient:  Right arm, Left upper leg, Left arm, Right lower leg, Chest, Abdomen, Left lower leg, Front perineal area, Buttocks, Face, Right upper leg         Bathing assist Assist Level: Contact Guard/Touching assist     Upper Body Dressing/Undressing Upper body dressing   What is the patient wearing?: Pull over shirt    Upper body assist Assist Level: Set up assist    Lower Body Dressing/Undressing Lower body dressing      What is the patient wearing?: Pants     Lower body assist Assist for lower body dressing: Contact Guard/Touching assist     Toileting Toileting    Toileting assist Assist for toileting: Minimal Assistance - Patient > 75%     Transfers Chair/bed transfer  Transfers assist     Chair/bed transfer assist level: Minimal Assistance - Patient > 75%     Locomotion Ambulation   Ambulation assist      Assist level: Minimal Assistance - Patient > 75% Assistive device: Walker-rolling Max distance: 60 ft   Walk 10 feet activity   Assist     Assist level: Contact Guard/Touching assist Assistive device: Walker-rolling   Walk 50 feet activity   Assist  Assist level: Minimal Assistance - Patient > 75% Assistive device: Walker-rolling    Walk 150 feet activity   Assist Walk 150 feet activity did not occur: Safety/medical concerns         Walk 10 feet on uneven surface  activity   Assist Walk 10 feet on uneven surfaces activity did not occur: Safety/medical concerns         Wheelchair     Assist Is the patient using a wheelchair?: Yes Type of Wheelchair: Manual    Wheelchair assist level: Minimal Assistance - Patient > 75%      Wheelchair 50 feet with 2 turns activity    Assist        Assist Level: Minimal Assistance - Patient > 75%   Wheelchair 150 feet activity     Assist      Assist Level: Contact Guard/Touching assist   Blood pressure 104/76, pulse 65, temperature 98 F (36.7 C), temperature source  Oral, resp. rate 16, height 5\' 10"  (1.778 m), weight 92.3 kg, SpO2 96 %.    Medical Problem List and Plan: 1. 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  Antithrombotics: -DVT/anticoagulation:  Pharmaceutical: Lovenox             -antiplatelet therapy: N/A 3. Pain Management: continue tylenol prn. Gabapentin increase from 300mg  to 400mg  TID for neuropathy. 4. Mood/Behavior/Sleep: LCSW to follow for evaluation and support.              --trazodone prn for insomnia.              -antipsychotic agents: N/A 5. Neuropsych/cognition: This patient is capable of making decisions on his own behalf. 6. Skin/Wound Care: Routine pressure relief measures.  7. Fluids/Electrolytes/Nutrition: Monitor I/O.  --Check CMET in am.  8. HTN: Monitor BP TID-currently hypotensive: d/c Cozaar. Now normotensive.      11/05/2022    5:16 AM 11/04/2022    7:51 PM 11/04/2022    7:29 PM  Vitals with BMI  Systolic 104 117   Diastolic 76 73   Pulse 65 69 65    9. T2DM: Hgb A1C-6.5 and new diagnosis w/last admisison? Will recheck fasting level. --will add CM restrictions. Will d/c CBGs since in low 100s 10. AAA. Outpatient f/u 11. Emphysema. No signs of exacerbation at this time.  12. LE muscle soreness: kpad added, continue prn  LOS: 3 days A FACE TO FACE EVALUATION WAS PERFORMED  11/07/2022 11/05/2022, 12:35 PM

## 2022-11-05 NOTE — Progress Notes (Signed)
Ambulated to [employer] and back with few rest breaks. Pt tolerated well.   Marylu Lund, RN

## 2022-11-06 DIAGNOSIS — G610 Guillain-Barre syndrome: Secondary | ICD-10-CM | POA: Diagnosis not present

## 2022-11-06 DIAGNOSIS — I10 Essential (primary) hypertension: Secondary | ICD-10-CM | POA: Diagnosis not present

## 2022-11-06 DIAGNOSIS — E119 Type 2 diabetes mellitus without complications: Secondary | ICD-10-CM | POA: Diagnosis not present

## 2022-11-06 DIAGNOSIS — J438 Other emphysema: Secondary | ICD-10-CM | POA: Diagnosis not present

## 2022-11-06 LAB — BASIC METABOLIC PANEL
Anion gap: 9 (ref 5–15)
BUN: 12 mg/dL (ref 8–23)
CO2: 23 mmol/L (ref 22–32)
Calcium: 9 mg/dL (ref 8.9–10.3)
Chloride: 108 mmol/L (ref 98–111)
Creatinine, Ser: 0.83 mg/dL (ref 0.61–1.24)
GFR, Estimated: >60 mL/min (ref >60)
Glucose, Bld: 110 mg/dL — ABNORMAL HIGH (ref 70–99)
Potassium: 3.9 mmol/L (ref 3.5–5.1)
Sodium: 140 mmol/L (ref 135–145)

## 2022-11-06 LAB — CBC
HCT: 39.2 % (ref 39.0–52.0)
Hemoglobin: 13.4 g/dL (ref 13.0–17.0)
MCH: 33.8 pg (ref 26.0–34.0)
MCHC: 34.2 g/dL (ref 30.0–36.0)
MCV: 99 fL (ref 80.0–100.0)
Platelets: 225 10*3/uL (ref 150–400)
RBC: 3.96 MIL/uL — ABNORMAL LOW (ref 4.22–5.81)
RDW: 13.2 % (ref 11.5–15.5)
WBC: 7.4 10*3/uL (ref 4.0–10.5)
nRBC: 0 % (ref 0.0–0.2)

## 2022-11-06 LAB — HEMOGLOBIN A1C
Hgb A1c MFr Bld: 6.4 % — ABNORMAL HIGH (ref 4.8–5.6)
Mean Plasma Glucose: 137 mg/dL

## 2022-11-06 MED ORDER — ACETAMINOPHEN 325 MG PO TABS: 325.0000 mg | ORAL_TABLET | ORAL | Status: DC | PRN

## 2022-11-06 NOTE — Patient Care Conference (Signed)
Inpatient RehabilitationTeam Conference and Plan of Care Update Date: 11/06/2022   Time: 11:14 AM    Patient Name: Joe Greene      Medical Record Number: [id number]  Date of Birth: [birth year] Sex: Male         Room/Bed: 4W01C/4W01C-01 Payor Info: Payor: [employer] MEDICARE / Plan: [employer] MEDICARE CHOICE PPO / Product Type: *No Product type* /    Admit Date/Time:  11/02/2022  6:18 PM  Primary Diagnosis:  GBS (Guillain Barre syndrome) [facility])  Hospital Problems: Principal Problem:   GBS (Guillain Barre syndrome) [facility])    Expected Discharge Date: Expected Discharge Date: 11/11/22  Team Members Present: Physician leading conference: Dr. Fanny Dance Social Worker Present: Dossie Der, LCSW Nurse Present: Vedia Pereyra, RN PT Present: Bernie Covey, PT OT Present: Valetta Fuller, OT PPS Coordinator present : Fae Pippin, SLP     Current Status/Progress Goal Weekly Team Focus  Bowel/Bladder   Patient is continent of B/B, LBM 11/05/22   Maintain continence   Assess toiletng QS/PRN,assess for daily/prn  meds    Swallow/Nutrition/ Hydration               ADL's   bathing/LB dressing-min A; transfers-CGA   mod I overall   BADLs, education, activity tolerance, safety awareness    Mobility   CGA with mobility overall, ambulating >150 ft, occ knee buckling that pt can usually correct   supervision transfers and gait, CGA stairs  gait, endurance    Communication                Safety/Cognition/ Behavioral Observations               Pain   Patient verbalize discomfort to feet and back medicated with schedule meds and prn meds as ordered   <2/10   Assess pain and discomfortQS/PRN , medciate per orders    Skin   Skin CDI,   [place] skin intergity  QS/PRN assessment      Discharge Planning:  HOme with wife who can be there, pt is progressing and doing well.   Team Discussion: GBS. Continent B/B. C/O H/A, pain to feet and back. Pain controlled with PRN  medications. Skin is CDI. B/P okay. Family education today with wife checked off for transfers. ADLs CGA/Min A. Gait with knee buckling that patient able to self correct.  Patient on target to meet rehab goals: yes, gait greater than 150 ft   *See Care Plan and progress notes for long and short-term goals.   Revisions to Treatment Plan:  Monitor labs, monitor B/P Teaching Needs: Medications, safety, self care, gait/transfer training, etc.   Current Barriers to Discharge: Decreased caregiver support and Home enviroment access/layout  Possible Resolutions to Barriers: Family education, nursing education, order recommended DME     Medical Summary Current Status: AIDP, HTN, T2DM, muscle soreness  Barriers to Discharge: Medical stability;Self-care education  Barriers to Discharge Comments: AIDP, HTN, T2DM, muscle soreness Possible Resolutions to [employer] Focus: monitor BP and CBG, Kpad for soreness, family education   Continued Need for Acute Rehabilitation Level of Care: The patient requires daily medical management by a physician with specialized training in physical medicine and rehabilitation for the following reasons: Direction of a multidisciplinary physical rehabilitation program to maximize functional independence : Yes Medical management of patient stability for increased activity during participation in an intensive rehabilitation regime.: Yes Analysis of laboratory values and/or radiology reports with any subsequent need for medication adjustment and/or medical intervention. : Yes   I  attest that I was present, lead the team conference, and concur with the assessment and plan of the team.   Ernest Pine 11/06/2022, 2:56 PM

## 2022-11-06 NOTE — Discharge Instructions (Addendum)
Inpatient Rehab Discharge Instructions  Jamason Peckham Discharge date and time:    Activities/Precautions/ Functional Status: Activity: no lifting, driving, or strenuous exercise  till cleared by MD Diet: diabetic diet Wound Care: none needed   Functional status:  ___ No restrictions     ___ Walk up steps independently ___ 24/7 supervision/assistance   ___ Walk up steps with assistance ___ Intermittent supervision/assistance  ___ Bathe/dress independently ___ Walk with walker     ___ Bathe/dress with assistance ___ Walk Independently    ___ Shower independently ___ Walk with assistance    ___ Shower with assistance _X__ No alcohol     ___ Return to work/school ________   Special Instructions: Your fasting A1C-6.4 (new diabetic but can be managed with diet at this time)--follow up with PCP for further monitoring/input.      COMMUNITY REFERRALS UPON DISCHARGE:     Outpatient: PT  & OT             Agency:[facility]  [address]  [place] [address] Phone:[phone]              Appointment Date/Time:WILL CALL TO SET UP FOLLOW UP APPOINTMENTS  Medical Equipment/Items Ordered: HAS ALL EQUIPMENT                                                 Agency/Supplier:NA     My questions have been answered and I understand these instructions. I will adhere to these goals and the provided educational materials after my discharge from the hospital.  Patient/Caregiver Signature _______________________________ Date __________  Clinician Signature _______________________________________ Date __________  Please bring this form and your medication list with you to all your follow-up doctor's appointments.

## 2022-11-06 NOTE — Progress Notes (Signed)
Occupational Therapy Session Note  Patient Details  Name: Joe Greene MRN: [id number] Date of Birth: May 30, 1951  Today's Date: 11/06/2022 OT Individual Time: 1102-1130 OT Individual Time Calculation (min): 28 min    Short Term Goals: Week 1:  OT Short Term Goal 1 (Week 1): STG= LTG d/t ELOS  Skilled Therapeutic Interventions/Progress Updates:    Patient agreeable to participate in OT session. Reports sore left hip. Pain increased when sitting on it the wrong way. Monitored during session.   Patient participated in skilled OT session focusing on patient/family training pertaining to shower transfers and safety, functional mobility, dynamic standing balance. Therapist educated wife and patient on safe technique when completing functional mobility into walk-in shower as well as utilizing manual wheelchair to wheel into bathroom if balance is not feeling the best in order to promote increased patient and caregiver independence while demonstrating good carry over of education when completing all functional mobility activities. Wife completed hands on training while pt performed stand pvt transfer into shower using bench using grab bar. Suggested use of towel to sit on if needed, placing towels outside the shower to contain water, undressing after transferring into shower, and placing dry towel under feet when drying off. Pt and wife verbalized and demonstrated understanding. Wife completed functional mobility with pt while navigating from room to Day room and back to room utilizing RW. Pt participated in standing alternating single leg balance activity to improve ability to stand and pull up pants, manage changes in surfaces and surface levels in the home and outside and navigate within his room with less risk of falling. Utilized RW to assist with balance with therapist providing CGA-Min A; pt performed alternating toe taps to cone; 20X total. Increased difficulty level by progressing amount of time  maintained holding single leg up off ground to eventually changing standing surface from stable to unstable using Airdex cushion. Completed a total of 4-5 trials while difficulty level increased with each trial. Therapist provided VC to keep RW close to body to provide stability versus away.     Therapy Documentation Precautions:  Precautions Precautions: Fall Precaution Comments: Neuropathy in Bil lower legs and feet, as well as Bil fingertips Restrictions Weight Bearing Restrictions: No  Therapy/Group: Individual Therapy  Limmie Patricia, OTR/L,CBIS  Supplemental OT - [facility] and [facility]  11/06/2022, 8:17 AM

## 2022-11-06 NOTE — Progress Notes (Signed)
Inpatient Rehabilitation Center Individual Statement of Services  Patient Name:  Joe Greene  Date:  11/06/2022  Welcome to the Inpatient Rehabilitation Center.  Our goal is to provide you with an individualized program based on your diagnosis and situation, designed to meet your specific needs.  With this comprehensive rehabilitation program, you will be expected to participate in at least 3 hours of rehabilitation therapies Monday-Friday, with modified therapy programming on the weekends.  Your rehabilitation program will include the following services:  Physical Therapy (PT), Occupational Therapy (OT), 24 hour per day rehabilitation nursing, Therapeutic Recreaction (TR), Neuropsychology, Care Coordinator, Rehabilitation Medicine, Nutrition Services, and Pharmacy Services  Weekly team conferences will be held on Tuesday to discuss your progress.  Your Inpatient Rehabilitation Care Coordinator will talk with you frequently to get your input and to update you on team discussions.  Team conferences with you and your family in attendance may also be held.  Expected length of stay: 7-10 days  Overall anticipated outcome: Independent with device  Depending on your progress and recovery, your program may change. Your Inpatient Rehabilitation Care Coordinator will coordinate services and will keep you informed of any changes. Your Inpatient Rehabilitation Care Coordinator's name and contact numbers are listed  below.  The following services may also be recommended but are not provided by the Inpatient Rehabilitation Center:  Driving Evaluations Home Health Rehabiltiation Services Outpatient Rehabilitation Services    Arrangements will be made to provide these services after discharge if needed.  Arrangements include referral to agencies that provide these services.  Your insurance has been verified to be:  [employer] Your primary doctor is:  Herbie Drape  Pertinent information will be shared  with your doctor and your insurance company.  Inpatient Rehabilitation Care Coordinator:  Dossie Der, [place] [phone] or Luna Glasgow  Information discussed with and copy given to patient by: Lucy Chris, 11/06/2022, 9:18 AM

## 2022-11-06 NOTE — Progress Notes (Signed)
Occupational Therapy Session Note  Patient Details  Name: Joe Greene MRN: [id number] Date of Birth: 28-Jan-1951  Today's Date: 11/06/2022 OT Individual Time: 1300-1415 OT Individual Time Calculation (min): 75 min    Short Term Goals: Week 1:  OT Short Term Goal 1 (Week 1): STG= LTG d/t ELOS  Skilled Therapeutic Interventions/Progress Updates:   Pt seen for skilled OT services. Pt motivated as d/c date for later week and reports desire to progress activity tolerance and balance. Pt amb to far gym from EOB with close s with RW ~250 ft. Completed 6 min on NuStep in 3- 2 min intervals on L 5 with brief rests in between. Progressed standing tolerance and balance during scanning and visual perceptual tasks using BITS. Stood 6 sets of 4 min with unilateral standing functional reach 6-7". Amb to day room with close s and RW ~200 ft then seated rest. OT training with breathing integration during additional standing reach task. Once back in room pt able to use toilet for voiding in standing with close s and RW support. Hand washing sink side standing with close S. Rested back in bed at end of session with bed exit alarm  engaged, nurse call button and needs in reach and no pain reported.   Therapy Documentation Precautions:  Precautions Precautions: Fall Precaution Comments: Neuropathy in Bil lower legs and feet, as well as Bil fingertips Restrictions Weight Bearing Restrictions: No    Therapy/Group: Individual Therapy  Vicenta Dunning 11/06/2022, 7:53 AM

## 2022-11-06 NOTE — Progress Notes (Signed)
PROGRESS NOTE   Subjective/Complaints: Sitting in bedside chair. His wife is in the room today.  No new complaints or concerns today.    ROS: Denies CP, SOB, N/V/D, vision changes, HA  Objective:   No results found. Recent Labs    11/06/22 0941  WBC 7.4  HGB 13.4  HCT 39.2  PLT 225    Recent Labs    11/06/22 0941  NA 140  K 3.9  CL 108  CO2 23  GLUCOSE 110*  BUN 12  CREATININE 0.83  CALCIUM 9.0     Intake/Output Summary (Last 24 hours) at 11/06/2022 1115 Last data filed at 11/06/2022 0725 Gross per 24 hour  Intake 1280 ml  Output --  Net 1280 ml         Physical Exam: Vital Signs Blood pressure 121/78, pulse 61, temperature 97.7 F (36.5 C), resp. rate 15, height _0  (1.778 m), weight 91.2 kg, SpO2 98 %. Gen: no distress, normal appearing HEENT: oral mucosa pink and moist, NCAT Cardio: Reg rate Chest: CTAB, normal effort, normal rate of breathing Abd: soft, non-distended, normoactive BS Ext: no edema Psych: pleasant, normal affect Skin: intact Neuro:  Alert and oriented x4, follows commands, normal speech and language, CN 2-12 grossly intact FTN intact b/l, HTS decreased b/l Strength 5/5 inb/l UE Strength 4+/5 in b/l LE DTR decreased throughout   Sensation decreased in distal hands and decreased bellow the knee bilaterally, absent sensation at b/l feet to LT Musculoskeletal: No joint swelling or tenderness IV LUE, R IJ catheter in place Ambulating with RW CG   Assessment/Plan: 1. Functional deficits which require 3+ hours per day of interdisciplinary therapy in a comprehensive inpatient rehab setting. Physiatrist is providing close team supervision and 24 hour management of active medical problems listed below. Physiatrist and rehab team continue to assess barriers to discharge/monitor patient progress toward functional and medical goals  Care Tool:  Bathing    Body parts bathed  by patient: Right arm, Left upper leg, Left arm, Right lower leg, Chest, Abdomen, Left lower leg, Front perineal area, Buttocks, Face, Right upper leg         Bathing assist Assist Level: Contact Guard/Touching assist     Upper Body Dressing/Undressing Upper body dressing   What is the patient wearing?: Pull over shirt    Upper body assist Assist Level: Set up assist    Lower Body Dressing/Undressing Lower body dressing      What is the patient wearing?: Pants     Lower body assist Assist for lower body dressing: Contact Guard/Touching assist     Toileting Toileting    Toileting assist Assist for toileting: Minimal Assistance - Patient > 75%     Transfers Chair/bed transfer  Transfers assist     Chair/bed transfer assist level: Minimal Assistance - Patient > 75%     Locomotion Ambulation   Ambulation assist      Assist level: Minimal Assistance - Patient > 75% Assistive device: Walker-rolling Max distance: 60 ft   Walk 10 feet activity   Assist     Assist level: Contact Guard/Touching assist Assistive device: Walker-rolling   Walk 50 feet activity  Assist    Assist level: Minimal Assistance - Patient > 75% Assistive device: Walker-rolling    Walk 150 feet activity   Assist Walk 150 feet activity did not occur: Safety/medical concerns         Walk 10 feet on uneven surface  activity   Assist Walk 10 feet on uneven surfaces activity did not occur: Safety/medical concerns         Wheelchair     Assist Is the patient using a wheelchair?: Yes Type of Wheelchair: Manual    Wheelchair assist level: Minimal Assistance - Patient > 75%      Wheelchair 50 feet with 2 turns activity    Assist        Assist Level: Minimal Assistance - Patient > 75%   Wheelchair 150 feet activity     Assist      Assist Level: Contact Guard/Touching assist   Blood pressure 121/78, pulse 61, temperature 97.7 F (36.5 C),  resp. rate 15, height _0  (1.778 m), weight 91.2 kg, SpO2 98 %.    Medical Problem List and Plan: 1. 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 Formulized individual treatment plan based on medical history, underlying problem and comorbidities.   2.  Antithrombotics: -DVT/anticoagulation:  Pharmaceutical: Lovenox             -antiplatelet therapy: N/A 3. Pain Management: continue tylenol prn. Gabapentin increase from 362m to 4039mTID for neuropathy. 4. Mood/Behavior/Sleep: LCSW to follow for evaluation and support.              --trazodone prn for insomnia.              -antipsychotic agents: N/A 5. Neuropsych/cognition: This patient is capable of making decisions on his own behalf. 6. Skin/Wound Care: Routine pressure relief measures.  7. Fluids/Electrolytes/Nutrition: Monitor I/O.  8. HTN: Monitor BP TID-currently hypotensive: d/c Cozaar. Now normotensive.   -well controlled     11/06/2022    5:51 AM 11/05/2022    7:55 PM 11/05/2022    3:59 PM  Vitals with BMI  Weight 201 lbs 1 oz    BMI 2853.61  Systolic [id number] 78 85 81  Pulse 61 79 68    9. T2DM: Hgb A1C-6.5 and new diagnosis w/last admisison? Will recheck fasting level. --will add CM restrictions. Will d/c CBGs since in low 100s -glucose 110 on BMP today, stable 10. AAA. Outpatient f/u 11. Emphysema. No signs of exacerbation at this time. Denies SOB 12. LE muscle soreness: kpad added, continue prn  LOS: 4 days A FACE TO FACE EVALUATION WAS PERFORMED  YuJennye Boroughs2/26/2023, 11:15 AM

## 2022-11-06 NOTE — Progress Notes (Signed)
Inpatient Rehabilitation Care Coordinator Assessment and Plan Patient Details  Name: Joe Greene MRN: [id number] Date of Birth: 05-07-1951  Today's Date: 11/06/2022  Hospital Problems: Principal Problem:   GBS (Guillain Barre syndrome) [facility])  Past Medical History:  Past Medical History:  Diagnosis Date   AAA (abdominal aortic aneurysm) (HCC)    Cataract    forming    Past Surgical History:  Past Surgical History:  Procedure Laterality Date   FOOT SURGERY Right    HERNIA REPAIR Right    IR FLUORO GUIDE CV LINE RIGHT  10/25/2022   IR US GUIDE VASC ACCESS RIGHT  10/25/2022   KNEE ARTHROSCOPY Left    WISDOM TOOTH EXTRACTION     Social History:  reports that he quit smoking about 7 weeks ago. His smoking use included cigarettes. He has a 100.00 pack-year smoking history. He has never used smokeless tobacco. He reports current alcohol use of about 3.0 standard drinks of alcohol per week. He reports that he does not use drugs.  Family / Support Systems Marital Status: Married Patient Roles: Spouse, Parent, Other (Comment) (retiree) Spouse/Significant Other: Albesa Seen  [phone] Other Supports: Friends and neighbors Anticipated Caregiver: wife Ability/Limitations of Caregiver: no limitations Caregiver Availability: 24/7 Family Dynamics: Close knit family who are there for one another-wife is here daily and involved in pt's care.  Social History Preferred language: English Religion: Christian Cultural Background: No issues Education: Some Charity fundraiser - How often do you need to have someone help you when you read instructions, pamphlets, or other written material from your doctor or pharmacy?: Never Writes: Yes Employment Status: Retired Marine scientist Issues: No issues Guardian/Conservator: None-according to MD pt is capable of making his own decisions while here   Abuse/Neglect Abuse/Neglect Assessment Can Be Completed: Yes Physical Abuse:  Denies Verbal Abuse: Denies Sexual Abuse: Denies Exploitation of patient/patient's resources: Denies Self-Neglect: Denies  Patient response to: Social Isolation - How often do you feel lonely or isolated from those around you?: Rarely  Emotional Status Pt's affect, behavior and adjustment status: Pt feels he should be doing better and wants to be back home ASAP> He reports he was doing well until it all started again. He will try to be patient and work on his deficits. He his making progress which is encouraging to him Recent Psychosocial Issues: other health issues was recently in the hospital for the same issue Psychiatric History: No history may benefit from seeing neuro-psych if here long enough for support Substance Abuse History: Recently quit tobacco no other issues  Patient / Family Perceptions, Expectations & Goals Pt/Family understanding of illness & functional limitations: Pt and wife can explain his GBS and medical issues, both talk with the MD daily and feel thay have a good understanding of his plan moving forward. Premorbid pt/family roles/activities: husband, neighbor, church members, family etc Anticipated changes in roles/activities/participation: resume Pt/family expectations/goals: Pt states: " I want to be doing better I was until this happened again."  Wife states: " He will get back to where he was i feel confident about that."  [employer]: Other (Comment) (Had gone to OP on [address] twice until this happen) Premorbid Home Care/DME Agencies: Other (Comment) (has rw, tub seat and wc) Transportation available at discharge: wife Is the patient able to respond to transportation needs?: Yes In the past 12 months, has lack of transportation kept you from medical appointments or from getting medications?: No In the past 12 months, has lack of transportation  kept you from meetings, work, or from getting things needed for daily living?:  No Resource referrals recommended: Neuropsychology  Discharge Planning Living Arrangements: Spouse/significant other Support Systems: Spouse/significant other, Other relatives, Friends/neighbors Type of Residence: Private residence Insurance Resources: Media planner (specify) ([employer] Medicare) Financial Resources: Social Security, Family Support Financial Screen Referred: No Living Expenses: Own Money Management: Patient, Spouse Does the patient have any problems obtaining your medications?: No Home Management: both Patient/Family Preliminary Plans: Return home with wife who is able to assist if needed. Pt is hopeful he willnot need assist and get back to his prior level before this set back. Care Coordinator Barriers to Discharge: Insurance for SNF coverage Care Coordinator Anticipated Follow Up Needs: HH/OP  Clinical Impression Pleasant gentleman who is motivated to do well and regain his independence like he was prior to this set back. He wants to return to OP and will see if needs new referral. Wife very supportive work on discharge needs.  Lucy Chris 11/06/2022, 9:30 AM

## 2022-11-06 NOTE — Progress Notes (Signed)
Physical Therapy Session Note  Patient Details  Name: Joe Greene MRN: [id number] Date of Birth: 01/23/1951  Today's Date: 11/06/2022 PT Individual Time: 0800-0915 PT Individual Time Calculation (min): 75 min   Short Term Goals: Week 1:  PT Short Term Goal 1 (Week 1): Pt will perform standing transfers with consistent CGA. PT Short Term Goal 2 (Week 1): Pt will perform standing activities with minimal physical assistance and for at least 5 min. PT Short Term Goal 3 (Week 1): Pt will manage w/c parts with CGA/ supervision assst  Skilled Therapeutic Interventions/Progress Updates:    pt received in bed and agreeable to therapy. Pt's wife present and requesting family education and to be checked off for transfers. Bed mobility with supervision. PT provided education on using gait belt, appropriate guarding, providing CGA throughout. Discussed that pt will likely be at supervision level at d/c but currently requires CGA d/t occasional knee buckling. Pt had 1 episode of knee buckling while walking with wife, but did not require therapist assistance. Pt ambulated 120-180 ft throughout session with CGA and RW. Pt utilizes externally rotated gait but reports this is baseline. Pt reports he had some later ankle instability while walking yesterday. With testing noted R inversion weakness and L inversion and eversion weakness. Educated on performing inversion/eversion exercises 3-4 x 1 min with provided green theraband. Pt's wife requested to trial shower set up. Pt performed transfer over simulated shower threshold with CGA and RW without difficulty. Pt then navigated 6" steps 2 x 12, first with 2 hand rails then with L only. Pt asking about using stairs at home for HEP until back at [facility]. Discussed options for doing this safely. Pt also navigated 8"curb with CGA and RW, cues for safety. Pt instructed on and performed floor transfer x 5. Discussed assessing for injury and calling EMS if necessary, pt able to  teach back. Pt also attempted bird dogs with 1 extremity at a time, provided education on safe performance if pt were to attempt at home. Pt returned to room and to bed with supervision, was left with all needs in reach and alarm active.   Therapy Documentation Precautions:  Precautions Precautions: Fall Precaution Comments: Neuropathy in Bil lower legs and feet, as well as Bil fingertips Restrictions Weight Bearing Restrictions: No General:       Therapy/Group: Individual Therapy  Juluis Rainier 11/06/2022, 10:08 AM

## 2022-11-06 NOTE — Progress Notes (Incomplete)
Initial Nutrition Assessment  DOCUMENTATION CODES:   Not applicable  INTERVENTION:  - Continue Carb Mod diet.   NUTRITION DIAGNOSIS:   Increased nutrient needs related to chronic illness as evidenced by estimated needs.  GOAL:   Patient will meet greater than or equal to 90% of their needs  MONITOR:   PO intake  REASON FOR ASSESSMENT:   Malnutrition Screening Tool    ASSESSMENT:   71 y.o. male admits to CIR related to functional deficits due to GBS flare. PMH includes: AAA.  Meds reviewed. Labs reviewed. Blood sugars well controlled.   Pt was out of room at time of assessment. Pt ate 100% of his breakfast this am. Per record, pt has been eating 75-100% of his meals since admission. Pt is likely meeting his needs at this time.   NUTRITION - FOCUSED PHYSICAL EXAM:  Unable to assess, attempt at f/u.   Diet Order:   Diet Order             Diet Carb Modified Fluid consistency: Thin; Room service appropriate? Yes  Diet effective now                   EDUCATION NEEDS:   Not appropriate for education at this time  Skin:  Skin Assessment: Reviewed RN Assessment  Last BM:  11/05/22  Height:   Ht Readings from Last 1 Encounters:  11/02/22 5\' 10"  (1.778 m)    Weight:   Wt Readings from Last 1 Encounters:  11/06/22 91.2 kg    Ideal Body Weight:     BMI:  Body mass index is 28.85 kg/m.  Estimated Nutritional Needs:   Kcal:  11/08/22 kcals  Protein:  115-135 gm  Fluid:  >/= 2.2 L  [id number], RD, LDN, CNSC.

## 2022-11-06 NOTE — Progress Notes (Signed)
Inpatient Rehabilitation  Patient information reviewed and entered into eRehab system by Joice Nazario M. Martha Soltys, M.A., CCC/SLP, PPS Coordinator.  Information including medical coding, functional ability and quality indicators will be reviewed and updated through discharge.    

## 2022-11-06 NOTE — Progress Notes (Signed)
NIF -36  VC 1.9  Pt demonstrated good effort

## 2022-11-06 NOTE — IPOC Note (Addendum)
Overall Plan of Care [facility]) Patient Details Name: Joe Greene MRN: [id number] DOB: [birth year]  Admitting Diagnosis: GBS (Guillain Barre syndrome) [facility])  Hospital Problems: Principal Problem:   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HCC)     Functional Problem List: Nursing Bladder, Bowel, Pain, Safety, Sensory, Skin Integrity, Endurance, Medication Management, Motor  PT Balance, Behavior, Edema, Endurance, Motor, Nutrition, Pain, Skin Integrity, Sensory  OT Balance, Safety, Sensory, Endurance, Motor, Pain  SLP    TR         Basic ADL's: OT Bathing, Dressing, Toileting     Advanced  ADL's: OT       Transfers: PT Bed Mobility, Bed to Chair, Car, Occupational psychologist, Research scientist (life sciences): PT Psychologist, prison and probation services, Ambulation, Stairs     Additional Impairments: OT None  SLP        TR      Anticipated Outcomes Item Anticipated Outcome  Self Feeding no goal  Swallowing      Basic self-care  mod I  Toileting  mod I   Bathroom Transfers mod I  Bowel/Bladder  Continent x 2  Transfers  Mod I  Locomotion  supervision  Communication     Cognition     Pain  Less than 4  Safety/Judgment  remain fall free while in rehab   Therapy Plan: PT Intensity: Minimum of 1-2 x/day ,45 to 90 minutes PT Frequency: 5 out of 7 days PT Duration Estimated Length of Stay: 10 days OT Intensity: Minimum of 1-2 x/day, 45 to 90 minutes OT Frequency: 5 out of 7 days OT Duration/Estimated Length of Stay: 7-10 days     Team Interventions: Nursing Interventions Patient/Family Education, Disease Management/Prevention, Skin Care/Wound Management, Discharge Planning, Bladder Management, Pain Management, Bowel Management, Medication Management, Psychosocial Support  PT interventions Ambulation/gait training, Community reintegration, DME/adaptive equipment instruction, Neuromuscular re-education, Psychosocial support, Stair training, Wheelchair propulsion/positioning, UE/LE Strength  taining/ROM, UE/LE Coordination activities, Therapeutic Activities, Skin care/wound management, Pain management, Functional electrical stimulation, Discharge planning, Warden/ranger, Cognitive remediation/compensation, Disease management/prevention, Functional mobility training, Patient/family education, Splinting/orthotics, Therapeutic Exercise, Visual/perceptual remediation/compensation  OT Interventions Balance/vestibular training, Pain management, UE/LE Coordination activities, Therapeutic Activities, Self Care/advanced ADL retraining, Functional mobility training, Patient/family education, Therapeutic Exercise, DME/adaptive equipment instruction, Community reintegration, Psychosocial support, UE/LE Strength taining/ROM, Discharge planning, Neuromuscular re-education  SLP Interventions    TR Interventions    SW/CM Interventions Discharge Planning, Psychosocial Support, Patient/Family Education   Barriers to Discharge MD  Medical stability and Home enviroment access/loayout  Nursing Decreased caregiver support, Neurogenic Bowel & Bladder, Weight home with spouse 1 level with ramp entry  PT Decreased caregiver support, Home environment access/layout, Neurogenic Bowel & Bladder, Insurance for SNF coverage, Nutrition means    OT      SLP      SW Insurance for SNF coverage     Team Discharge Planning: Destination: PT-Home ,OT- Home , SLP-  Projected Follow-up: PT-Home health PT, 24 hour supervision/assistance, OT-  Outpatient OT, SLP-  Projected Equipment Needs: PT-To be determined, OT- None recommended by OT, SLP-  Equipment Details: PT- , OT-  Patient/family involved in discharge planning: PT- Patient,  OT-Patient, Family member/caregiver, SLP-   MD ELOS: 7-10 Medical Rehab Prognosis:  Excellent Assessment: The patient has been admitted for CIR therapies with the diagnosis of Acute inflammatory demyelinating polyneuropathy   . The team will be addressing functional  mobility, strength, stamina, balance, safety, adaptive techniques and equipment, self-care, bowel and bladder mgt, patient and caregiver education. Goals have  been set at Mod I to supervision with PT and OT . Anticipated discharge destination is home.        See Team Conference Notes for weekly updates to the plan of care

## 2022-11-06 NOTE — Progress Notes (Signed)
Patient ID: Joe Greene, male   DOB: 1951/08/27, 71 y.o.   MRN: [id number]  Met with pt and wife who is present in his room to update both on team conference goals of mod/I level and discharge date 1/1, pt would like to go 12/31 due to tax purposes and have asked team and MD and all are fine with switch to 12/31. Wife will be here to go through therapies and education with him. Have all equipment and want to be referred again to OP on [address] where he went twice before coming back into the hospital. Will fax referral today to OP so there will not be a delay in scheduling him.

## 2022-11-07 ENCOUNTER — Ambulatory Visit: Payer: Medicare PPO | Admitting: Physical Therapy

## 2022-11-07 ENCOUNTER — Ambulatory Visit: Payer: Medicare PPO | Admitting: Family Medicine

## 2022-11-07 DIAGNOSIS — G610 Guillain-Barre syndrome: Secondary | ICD-10-CM | POA: Diagnosis not present

## 2022-11-07 DIAGNOSIS — R7989 Other specified abnormal findings of blood chemistry: Secondary | ICD-10-CM

## 2022-11-07 MED ORDER — ASPIRIN 81 MG PO TBEC
81.0000 mg | DELAYED_RELEASE_TABLET | Freq: Every day | ORAL | Status: DC
  Administered 2022-11-07 – 2022-11-10 (×4): 81 mg via ORAL
  Filled 2022-11-07 (×4): qty 1

## 2022-11-07 MED ORDER — ADULT MULTIVITAMIN W/MINERALS CH
1.0000 | ORAL_TABLET | Freq: Every day | ORAL | Status: DC
  Administered 2022-11-07 – 2022-11-10 (×4): 1 via ORAL
  Filled 2022-11-07 (×4): qty 1

## 2022-11-07 MED ORDER — FERROUS SULFATE 325 (65 FE) MG PO TABS
325.0000 mg | ORAL_TABLET | Freq: Every day | ORAL | Status: DC
  Administered 2022-11-08 – 2022-11-10 (×3): 325 mg via ORAL
  Filled 2022-11-07 (×3): qty 1

## 2022-11-07 NOTE — Progress Notes (Signed)
Physical Therapy Session Note  Patient Details  Name: Joe Greene MRN: [id number] Date of Birth: [birth year]  Today's Date: 11/07/2022 PT Individual Time: 1062-6948 PT Individual Time Calculation (min): 40 min   Short Term Goals: Week 1:  PT Short Term Goal 1 (Week 1): Pt will perform standing transfers with consistent CGA. PT Short Term Goal 2 (Week 1): Pt will perform standing activities with minimal physical assistance and for at least 5 min. PT Short Term Goal 3 (Week 1): Pt will manage w/c parts with CGA/ supervision assst  Skilled Therapeutic Interventions/Progress Updates: Pt presented in w/c agreeable to therapy. Pt states some mild low back pain, rest and repositioning provided during session. Pt requesting to work on walking and balance this session. Pt propelled w/c to day room for general conditioning and performed stand pivot transfer with CGA to high/low mat. Pt then participated in ambulation with HHA ~123f with minA fading to [place] Pt noted to have decreased arm swing, narrow BOS, increased L lateral lean but no overt LOB. Pt then participated in agility ladder participating in forward and side stepping. PTA noted when pt sidestepping to R increased L knee instability noted. Pt then discussing home set up and concerned regarding being able to get up in recliner. Pt transferred to w/c in same manner as prior and transported to ADL apt. Performed ambulaotry transfer to recliner and pt was able to stand with CGA from rocker recliner with use of arm rest and RW. Pt then returned to w/c and transported to main gym where pt participated in [employer] LOS static level. Pt participated in x 3 bouts with improvement in scores 9%, 11% and 20% respectively. Pt noted to have more difficulty with anterior weight shifting but improved hip and ankle strategy with repetition. After rest break, pt propelled to 4W nsg station and ambulated back to room with HHA and light min A. Pt returned to w/c at end of  session and let with call bell within reach and needs met.    Therapy Documentation Precautions:  Precautions Precautions: Fall Precaution Comments: Neuropathy in Bil lower legs and feet, as well as Bil fingertips Restrictions Weight Bearing Restrictions: No General:   Vital Signs: Therapy Vitals Temp: 97.7 F (36.5 C) Temp Source: Oral Pulse Rate: 77 Resp: 17 BP: 119/83 Patient Position (if appropriate): Sitting Oxygen Therapy SpO2: 95 % O2 Device: Room Air Pain:   Mobility:   Locomotion :    Trunk/Postural Assessment :    Balance:   Exercises:   Other Treatments:      Therapy/Group: Individual Therapy  Cali Hope 11/07/2022, 4:05 PM

## 2022-11-07 NOTE — Progress Notes (Signed)
  Signed      NIF -36             VC 3.20   Pt demonstrated good effort

## 2022-11-07 NOTE — Progress Notes (Signed)
Physical Therapy Session Note  Patient Details  Name: Joe Greene MRN: [id number] Date of Birth: [birth year]  Today's Date: 11/07/2022 PT Individual Time: 8295-6213 PT Individual Time Calculation (min): 73 min   Short Term Goals: Week 1:  PT Short Term Goal 1 (Week 1): Pt will perform standing transfers with consistent CGA. PT Short Term Goal 2 (Week 1): Pt will perform standing activities with minimal physical assistance and for at least 5 min. 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 Pt reports bilateral foot soreness and left hip pain, provided with repositioning and rest breaks for relief. Pt requires (S) for bed mobility and CGA by spouse with sit to stand and gait to main gym. Pt performed following exercises with verbal, tactile, and visual cues for technique:   -tall <>half kneeling 2 x 10   -Q-ped alternating LE extension 3 x 5   -dolphin press up 3 x 10   -bridges 3 x 10   -triceps dips 2 x 5  Pt requested to address balance deficits and required CGA for standing dynamic reactive balance with ball toss to spouse x 10.  Transitioned to flexibility and stretching at end of session as pt performed child pose rocks 1 x 10 along with q-ped truncal rotation and threading the needle 1 x 10 bilaterally. PT provided PROM to address hip flexion, ER, and IR deficits. Pt propelled w/c to room and left seated at bedside with all needs in reach.     Therapy/Group: Individual Therapy  Truitt Leep Truitt Leep PT, DPT  11/07/2022, 7:46 AM

## 2022-11-07 NOTE — Progress Notes (Signed)
Met with patient and wife at bedside.  Discussed discharge date. Changed to 12/31. Asked if will get an extra day of therapy if stays till the 1st since weekend. Grad day will be Saturday and will be evaluated like evaluation on day one for improvements. If stayed an extra day because of Saturday and Sunday will have a grad day and may or may not have therapy one day. Would not benefit to stay the extra day. Discussed A1C 6.5 and trig 498. Reduce all processed foods and condiments to try and lower Trig. Has not smoked since this November, reports it's more of a physical thing than an addiction. Discussed steps and suggested gum to see if helps. Asked about 75m ASA that neurologist started him on PTA. Informed that PA will go over discharge medications. Do not go home and add back medications that was taking PTA.  Will have follow ups with PCP for medications and neurologist as well.  ASA may be added back with discharge paperwork but MD will have to advise. Changed dressing to neck from IJ removal. Informed when get home can keep open to air but while here to keep covered. All needs met, call bell in reach.

## 2022-11-07 NOTE — Progress Notes (Signed)
Patient ID: Joe Greene, male   DOB: [birth year], 71 y.o.   MRN: [id number]  Met with pt and wife to make aware OP had received the referral and they will reach to make follow up appointments.

## 2022-11-07 NOTE — Discharge Summary (Signed)
Physician Discharge Summary  Patient ID: Joe Greene MRN: [id number] DOB/AGE: July 09, 1951 71 y.o.  Admit date: 11/02/2022 Discharge date: 11/10/2022  Discharge Diagnoses:  Principal Problem:   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HCC) Active problems:  Hypertension Newly diagnosed diabetes mellitus AAA Emphysema Tobacco abuse.  History of anemia  Discharged Condition: Stable  Significant Diagnostic Studies: N/A   Labs:  Basic Metabolic Panel: Recent Labs  Lab 11/03/22 0600 11/06/22 0941  NA 140 140  K 3.9 3.9  CL 110 108  CO2 20* 23  GLUCOSE 120* 110*  BUN 16 12  CREATININE 0.93 0.83  CALCIUM 8.6* 9.0    CBC: Recent Labs  Lab 11/03/22 0600 11/06/22 0941  WBC 8.2 7.4  NEUTROABS 4.3  --   HGB 13.7 13.4  HCT 41.2 39.2  MCV 99.0 99.0  PLT 188 225    CBG: Recent Labs  Lab 11/03/22 1716 11/03/22 2040 11/04/22 0541 11/04/22 1127 11/05/22 1154  GLUCAP 101* 111* 100* 100* 105*    Brief HPI:   Joe Greene is a 71 y.o. right-handed male with history of URI with cough/loss of taste followed by progressive numbness upper and lower extremities with weakness and GBS diagnosis on admission 09/24/2022.  Joe Greene was treated with IVIG x 5 days and discharged to home walking without assistance.  Joe Greene developed weakness 11/27 with sensory deficits from waist down and difficulty walking.  Joe Greene was evaluated by Dr. 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  Central line placed on 12/14 Joe Greene was started on IVIG with improvement in symptoms.  Joe Greene completed 5/5 exchange reports improvement in sensory deficits now with some numbness in feet and bilateral palms.  NIF-32 and vital capacity 2.7 L.  Neurology id not plan for further plasma exchange, plan for catheter removal.  PT /OT working with ongoing deficits and patient was admitted for a comprehensive rehab  program   Hospital Course: Joe Greene was admitted to rehab 11/02/2022 for inpatient therapies to consist of P and OT at least three hours five days a week. Past admission physiatrist, therapy team and rehab RN have worked together to provide customized collaborative inpatient rehab.  Joe Greene was maintained on SQ Lovenox for DVT prophylaxis and follow up CBC showed H/H to be stable. Joe Greene continues on low dose ASA. Pain was managed with use of gabapentin titrated as needed.  Blood pressure remained a bit soft therefore his Cozaar was held and Joe Greene will will need outpatient follow-up for input on resumption.   His BS were monitored on achs basis briefly due to new diagnosis of DM with Hgb A1c 6.5 . Joe Greene was placed on CM diet and as this has been effective in BS control, CBGs check were discontinued. Respiratory status has been stable and nicotine patch was used to due to history of tobacco use. Joe Greene has been educated on importance of nicotine cessation and is to follow up with [facility] in 6 months for monitoring of AAA. Joe Greene has made good gains during his rehab stay and is modified independent in supervised setting. Joe Greene will continue to receive follow up outpatient PT and OT at [facility] after discharge.    Rehab course: During patient's stay in rehab weekly team conferences were held to monitor patient's progress, set goals and discuss barriers to discharge. At admission, patient required min assist for ADL tasks and for mobility.  Joe Greene  has had improvement in  activity tolerance, balance, postural control as well as ability to compensate for deficits. Therapy has been working with patient on energy conservation techniques.  Joe Greene is able to complete ADL tasks at modified independent level. Joe Greene is independent for transfers and to ambulate in household setting. Joe Greene is able to ambulate 200' with supervision and verbal cues. Family education has been completed.   Disposition: Home  Diet: Regular  Special Instructions: No  driving till cleared by MD 2.   Follow up with Dr. Randie Heinz in 6 moths with repeat imaging of AAA.    Medications at discharge 1.  Tylenol as needed 2.  Aspirin 81 mg p.o. daily 3.  Ferrous sulfate 325 mg p.o. daily 4.  Neurontin 400 mg p.o. 3 times daily 5.  Multivitamin daily 6.  NicoDerm patch taper as directed  30-35 minutes were spent completing discharge summary and discharge planning    Discharge Instructions     Ambulatory referral to Physical Medicine Rehab   Complete by: As directed         Follow-up Information     Rudd, Bertram Millard, MD Follow up.   Specialty: Family Medicine Why: Call in 1-2 days for post hospital follow up Contact information: [address] [place] [address] [phone]         Levert Feinstein, MD Follow up.   Specialty: Neurology Why: Call in 1-2 days for post hospital follow up Contact information: [address] [place] [address] [phone]         Fanny Dance, MD Follow up.   Specialty: Physical Medicine and Rehabilitation Why: office will call you with follow up appointment Contact information: [address] [place] [address] [phone]                 Signed: Charlton Amor 11/09/2022, 5:14 AM

## 2022-11-07 NOTE — Progress Notes (Signed)
PROGRESS NOTE   Subjective/Complaints: Pt asked about healthy diet options. His wife asked if outpatient therapy has been set up.    ROS: Denies CP, SOB, N/V/D, vision changes, HA, abdominal pain  Objective:   No results found. Recent Labs    11/06/22 0941  WBC 7.4  HGB 13.4  HCT 39.2  PLT 225    Recent Labs    11/06/22 0941  NA 140  K 3.9  CL 108  CO2 23  GLUCOSE 110*  BUN 12  CREATININE 0.83  CALCIUM 9.0     Intake/Output Summary (Last 24 hours) at 11/07/2022 0828 Last data filed at 11/07/2022 6803 Gross per 24 hour  Intake 820 ml  Output --  Net 820 ml         Physical Exam: Vital Signs Blood pressure 114/72, pulse 67, temperature 98.3 F (36.8 C), resp. rate 15, height 5\' 10"  (1.778 m), weight 91.2 kg, SpO2 96 %. Gen: no distress, normal appearing HEENT: oral mucosa pink and moist, NCAT, conjugate gaze Cardio: RRR Chest: CTAB, normal effort, normal rate of breathing Abd: soft, non-distended, normoactive BS Ext: no edema Psych: pleasant, normal affect Skin: intact Neuro:  Alert and oriented x4, follows commands, normal speech and language, CN 2-12 grossly intact FTN intact b/l, HTS decreased b/l Strength 5/5 inb/l UE Strength 4+/5 in b/l LE DTR decreased throughout   Sensation decreased in distal hands and decreased bellow the knee bilaterally, absent sensation at b/l feet to LT Musculoskeletal: No joint swelling or tenderness Ambulating with RW CG   Assessment/Plan: 1. Functional deficits which require 3+ hours per day of interdisciplinary therapy in a comprehensive inpatient rehab setting. Physiatrist is providing close team supervision and 24 hour management of active medical problems listed below. Physiatrist and rehab team continue to assess barriers to discharge/monitor patient progress toward functional and medical goals  Care Tool:  Bathing    Body parts bathed by patient:  Right arm, Left upper leg, Left arm, Right lower leg, Chest, Abdomen, Left lower leg, Front perineal area, Buttocks, Face, Right upper leg         Bathing assist Assist Level: Contact Guard/Touching assist     Upper Body Dressing/Undressing Upper body dressing   What is the patient wearing?: Pull over shirt    Upper body assist Assist Level: Set up assist    Lower Body Dressing/Undressing Lower body dressing      What is the patient wearing?: Pants     Lower body assist Assist for lower body dressing: Contact Guard/Touching assist     Toileting Toileting    Toileting assist Assist for toileting: Minimal Assistance - Patient > 75%     Transfers Chair/bed transfer  Transfers assist     Chair/bed transfer assist level: Minimal Assistance - Patient > 75%     Locomotion Ambulation   Ambulation assist      Assist level: Minimal Assistance - Patient > 75% Assistive device: Walker-rolling Max distance: 60 ft   Walk 10 feet activity   Assist     Assist level: Contact Guard/Touching assist Assistive device: Walker-rolling   Walk 50 feet activity   Assist    Assist  level: Minimal Assistance - Patient > 75% Assistive device: Walker-rolling    Walk 150 feet activity   Assist Walk 150 feet activity did not occur: Safety/medical concerns         Walk 10 feet on uneven surface  activity   Assist Walk 10 feet on uneven surfaces activity did not occur: Safety/medical concerns         Wheelchair     Assist Is the patient using a wheelchair?: Yes Type of Wheelchair: Manual    Wheelchair assist level: Minimal Assistance - Patient > 75%      Wheelchair 50 feet with 2 turns activity    Assist        Assist Level: Minimal Assistance - Patient > 75%   Wheelchair 150 feet activity     Assist      Assist Level: Contact Guard/Touching assist   Blood pressure 114/72, pulse 67, temperature 98.3 F (36.8 C), resp. rate 15,  height 5\' 10"  (1.778 m), weight 91.2 kg, SpO2 96 %.    Medical Problem List and Plan: 1. Functional deficits secondary to Acute inflammatory demyelinating polyneuropathy              -patient may  shower             -ELOS/Goals: 12/31, mod I to supervision             -Continue CIR             -Q12h NIF and VC             -Plan for HD cath removal after treatment complete, IR was consulted and plans to f/u in CIR   2.  Antithrombotics: -DVT/anticoagulation:  Pharmaceutical: Lovenox             -antiplatelet therapy: 12/27 restart ASA 81mg  3. Pain Management: continue tylenol prn. Gabapentin increase from 300mg  to 400mg  TID for neuropathy. 4. Mood/Behavior/Sleep: LCSW to follow for evaluation and support.              --trazodone prn for insomnia.              -antipsychotic agents: N/A 5. Neuropsych/cognition: This patient is capable of making decisions on his own behalf. 6. Skin/Wound Care: Routine pressure relief measures.  7. Fluids/Electrolytes/Nutrition: Monitor I/O.  8. HTN: Monitor BP TID-currently hypotensive: d/c Cozaar. Now normotensive.   -well controlled     11/07/2022    4:58 AM 11/06/2022    8:07 PM 11/06/2022    3:06 PM  Vitals with BMI  Systolic 114 110  Diastolic 72 66 76  Pulse 67 73 68    9. T2DM: Hgb A1C-6.5 and new diagnosis w/last admisison? Will recheck fasting level. --will add CM restrictions. Will d/c CBGs since in low 100s -glucose 110 on BMP today, stable 10. AAA. Outpatient f/u 11. Emphysema. No signs of exacerbation at this time. Denies SOB 12. LE muscle soreness: kpad added, continue prn 13. History of anemia with low B12, Last level 506  -Restart multivitamin, restart home Iron supplement   LOS: 5 days A FACE TO FACE EVALUATION WAS PERFORMED  11/09/2022 11/07/2022, 8:28 AM

## 2022-11-07 NOTE — Progress Notes (Signed)
Occupational Therapy Session Note  Patient Details  Name: Joe Greene MRN: [id number] Date of Birth: 1951/06/18  Today's Date: 11/07/2022 OT Individual Time: 1415-1530 OT Individual Time Calculation (min): 75 min   Short Term Goals: Week 1:  OT Short Term Goal 1 (Week 1): STG= LTG d/t ELOS  Skilled Therapeutic Interventions/Progress Updates:     Pt received sitting up in wc in good spirits and receptive to skilled therapy session. Pt reporting 0/10 pain and requesting to use restroom at beginning of session. Pt ambulated within room and used restroom in standing with RW (continent void) CGA. Pt able to wash hands in standing using RW maintaining balance CGA.  Pt ambulated to therapy gym using RW CGA for functional mobility training.   Pt completed functional mobility activity ambulating without AD min HHA. Pt instructed to retrieved blue and black clothes pins (medium and moderate resistance) from mat table, ambulate ~18 feet to basketball goal and place pins on goal using RUE. Pt able to complete task with min HHA. Pt then retrieved 10/10 clothes pins from basketball net using  RUE. Pt initially completed turns to L side when placing pins with min LOB noted x2, retrieving clothes pins making turns to L with mod LOB noted x3. Pt provided verbal cues and mod motivation during task to increase Pt safety and moral.   Pt completed compound BUE and BLE exercises to address balance and strength deficits and increase body awareness. Pt able to complete 3x12 reps of overhead press and squat combination exercises with 1-2 minute rest breaks provided between sets with a focus on slow/controlled movements. Pt provided skilled education on coordinating breath and movement demonstrating teach back as evidence of learning.   Pt completed dynamic standing bean bag throw task with CGA to facilitate anterior/posterior weight shifting in standing. Pt able to complete partial squat to retrieve bean bags from low  table and correct weight shift following throw with CGA to occasional min A. Pt completed task 2x throwing with R hand initially and then switching to L hand during second set.   Pt ambulated back to room using RW close supervision and was left resting in wc with call bell in reach and all needs met.   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

## 2022-11-08 ENCOUNTER — Ambulatory Visit: Payer: Medicare PPO | Admitting: Physical Therapy

## 2022-11-08 ENCOUNTER — Other Ambulatory Visit (HOSPITAL_COMMUNITY): Payer: Self-pay

## 2022-11-08 DIAGNOSIS — G610 Guillain-Barre syndrome: Secondary | ICD-10-CM | POA: Diagnosis not present

## 2022-11-08 MED ORDER — GABAPENTIN 400 MG PO CAPS
400.0000 mg | ORAL_CAPSULE | Freq: Three times a day (TID) | ORAL | 0 refills | Status: DC
  Filled 2022-11-08: qty 90, 30d supply, fill #0

## 2022-11-08 MED ORDER — NICOTINE 14 MG/24HR TD PT24
14.0000 mg | MEDICATED_PATCH | Freq: Every day | TRANSDERMAL | 0 refills | Status: DC
  Filled 2022-11-08: qty 28, 28d supply, fill #0

## 2022-11-08 NOTE — Progress Notes (Signed)
Occupational Therapy Session Note  Patient Details  Name: Joe Greene MRN: [id number] Date of Birth: [birth year]  Today's Date: 11/08/2022 OT Individual Time: 1300-1355 OT Individual Time Calculation (min): 55 min    Short Term Goals: Week 1:  OT Short Term Goal 1 (Week 1): STG= LTG d/t ELOS  Skilled Therapeutic Interventions/Progress Updates:    OT intervention with focus on toileting, functional amb with RW, standing balance, and FMC tasks to increase independence with BADLS. Amb with RW into bathroom and stood at toilet to void. Pt stood at sink to wash hands. All tasks with CGA. Pt amb with RW to Day Room and engaged in variety of standing activities: placing nuts on bolts followed by removing nuts from bolts while standing on AirEx (pt required CGA while standing on AirEx. Pt retrieved bean bags from floor to use in Cornhole. Pt retrieved bags utilizing a modified squat with RUE on RW for support. Pt stood at table to clean all bean bags. Pt amb with RW and returned to bed. All needs within reach. Bed alarm activated.   Therapy Documentation Precautions:  Precautions Precautions: Fall Precaution Comments: Neuropathy in Bil lower legs and feet, as well as Bil fingertips Restrictions Weight Bearing Restrictions: No  Pain:  Pt denies pain but reports tingling in finger tips   Therapy/Group: Individual Therapy  Rich Brave 11/08/2022, 2:52 PM

## 2022-11-08 NOTE — Progress Notes (Signed)
NIF= -35 VC= 1.67L With good pt effort.

## 2022-11-08 NOTE — Progress Notes (Signed)
Physical Therapy Session Note  Patient Details  Name: Joe Greene MRN: [id number] Date of Birth: Jun 12, 1951  Today's Date: 11/08/2022 PT Individual Time: 0805-0915 PT Individual Time Calculation (min): 70 min   Short Term Goals: Week 1:  PT Short Term Goal 1 (Week 1): Pt will perform standing transfers with consistent CGA. PT Short Term Goal 2 (Week 1): Pt will perform standing activities with minimal physical assistance and for at least 5 min. PT Short Term Goal 3 (Week 1): Pt will manage w/c parts with CGA/ supervision assst  Skilled Therapeutic Interventions/Progress Updates: Pt presented in w/c with wife and MD present agreeable to therapy. MD providing education to pt and family regarding GBS in additional to daily assessment. Pt then performed Sit to stand with CGA and wife providing guarding to pt while ambulating to day room. Wife noted to demonstrate good safety awareness and providing appropriate cues for pacing when needed. In day room inquired to pt and wife if any additional concerns regarding d/c with wife questioning bed mobility. Session then focused on family education and overall functional mobility with wife present in preparation for d/c. Pt then ambulated to RW and wife providing same guarding as prior to ADL apt >236f with pt performed recliner transfers at supervision level and bed mobility at independent level. Pt then ambulated to main gym and PTA provided education to pt and wife regarding floor transfer/fall recovery. Pt was able to verbalize understanding, teachback, and demonstrate fall recovery with the use of mat for support with supervision. Pt then participated in varying standing balance activities including use of rebounder standing forward as well as incorporating rotational component. Pt also participated in standing challenges while standing on airex including alternating shoulder flexion to 90, static stand, and two rounds of assembling lego patterns while standing  on airex. Pt also participated in balance/gait training without AD. Participated in weaving through cones ~269fx 4 with HHA. Pt with x 1 occurrence of LOB requiring light minA from PTA for recovery. Pt also attempting toe taps to cone which when performed with LLE requiring increased support from PTA due to stance leg (RLE) instability. After seated rest pt ambulated back to room without AD and CGA with PTA applying 1lb weights on B wrists for improved feedback and to promote reciprocal arm swing. Pt noted to have increased lateral sway with fatigue but no stumbling nor overt LOB noted. In room pt returned to w/c at end of session and left with call bell within reach and needs met.      Therapy Documentation Precautions:  Precautions Precautions: Fall Precaution Comments: Neuropathy in Bil lower legs and feet, as well as Bil fingertips Restrictions Weight Bearing Restrictions: No General:   Vital Signs:  Pain:   Mobility:   Locomotion :    Trunk/Postural Assessment :    Balance:   Exercises:   Other Treatments:      Therapy/Group: Individual Therapy  [name] 11/08/2022, 1:54 PM

## 2022-11-08 NOTE — Progress Notes (Signed)
Physical Therapy Session Note  Patient Details  Name: Joe Greene MRN: [id number] Date of Birth: 05-06-1951  Today's Date: 11/08/2022 PT Individual Time: 1430-1530 PT Individual Time Calculation (min): 60 min   Short Term Goals: Week 1:  PT Short Term Goal 1 (Week 1): Pt will perform standing transfers with consistent CGA. PT Short Term Goal 2 (Week 1): Pt will perform standing activities with minimal physical assistance and for at least 5 min. PT Short Term Goal 3 (Week 1): Pt will manage w/c parts with CGA/ supervision assst  Skilled Therapeutic Interventions/Progress Updates:    pt received in bed and agreeable to therapy. Supine<>sit with supervision. Pt performed ambulatory transfer to bathroom at both beginning and end of session with CGA-supervision, continent void in standing with CGA. Hand hygiene at sink with supervision.   Gait: Pt ambulated during session with RW and CGA fading to supervision with RW. Pt also performed gait tasks with no AD and CGA. Pt retrieved horseshoes from around gym with CGA, no overt LOB noted. Pt reports he has the most difficulty when turning to the right. Pt then retrieved horseshoes from // bars one at a time and walked back to mat table, cue to turn toward harder R side. Pt with occasional small LOB with this, but able to catch with CGA only. Discussed strategies to maintain balance while turning.  NMR: Pt performed tall kneeling squats with GTB resistance 3 x 10 with UE support on bench for  improved muscle activation during functional activities. Pt also performed bird dog with 1LE extended but on mat table and CL UE x 3 sec holds, 2 x 8. Progressed to adding LE lifting to side with BUE support for LE proprioceptive and coordination challenge, 2 x 6.   Pt returned to room and bed after session and was left with all needs in reach and alarm active.   Therapy Documentation Precautions:  Precautions Precautions: Fall Precaution Comments: Neuropathy  in Bil lower legs and feet, as well as Bil fingertips Restrictions Weight Bearing Restrictions: No General:      Therapy/Group: Individual Therapy  Juluis Rainier 11/08/2022, 4:01 PM

## 2022-11-08 NOTE — Progress Notes (Signed)
RT at beside to obtain NIF & VC this AM. Pt was not in room at that time. RT will attempt again at later time.

## 2022-11-08 NOTE — Progress Notes (Signed)
Patient good effort NIF -35 VC 3.10L

## 2022-11-08 NOTE — Progress Notes (Signed)
PROGRESS NOTE   Subjective/Complaints:  Pt reports didn't sleep well last night- staff came in at 5am for weight and vitals, and also ~ 4 am to replace gloves and then 7am, the repairman came in for faucet issues.   Legs feel like has to move/gotta "go" but not sore or painful.  Feet are tingling pretty bad this AM- said gabapentin increased but didn't know why- I explained due to tingling, to help with Sx's.   LBM this AM- but didn't get breakfast- Spoke with PT- they will order another for after therapy/PT this AM   Wife also thinks since pt is usually very active, that Dm dx due to lack of movement- I explained it likely could be, since it's been 2 months, but need PCP to recheck in ~ 2.5 to 3 months, recheck an HbA1c.   ROS:  Pt denies SOB, abd pain, CP, N/V/C/D, and vision changes  Objective:   No results found. Recent Labs    11/06/22 0941  WBC 7.4  HGB 13.4  HCT 39.2  PLT 225   Recent Labs    11/06/22 0941  NA 140  K 3.9  CL 108  CO2 23  GLUCOSE 110*  BUN 12  CREATININE 0.83  CALCIUM 9.0    Intake/Output Summary (Last 24 hours) at 11/08/2022 0852 Last data filed at 11/07/2022 1849 Gross per 24 hour  Intake 832 ml  Output --  Net 832 ml        Physical Exam: Vital Signs Blood pressure 135/79, pulse (!) 59, temperature 97.6 F (36.4 C), resp. rate 19, height 5\' 10"  (1.778 m), weight 91.6 kg, SpO2 97 %.  General: awake, alert, appropriate, sitting up in bedside w/c; wife at bedside; PT joined as well; NAD HENT: conjugate gaze; oropharynx moist CV: borderline bradycardic rate; no JVD Pulmonary: CTA B/L; no W/R/R- good air movement GI: soft, NT, ND, (+)BS Skin- just from HD catheter- no actual wounds Psychiatric: appropriate but frustrated about wakenings Neurological: Ox3 Neuro:  Alert and oriented x4, follows commands, normal speech and language, CN 2-12 grossly intact FTN intact b/l, HTS  decreased b/l Strength 5/5 inb/l UE Strength 4+/5 in b/l LE DTR decreased throughout   Sensation decreased in distal hands and decreased bellow the knee bilaterally, absent sensation at b/l feet to LT Musculoskeletal: No joint swelling or tenderness Ambulating with RW CG   Assessment/Plan: 1. Functional deficits which require 3+ hours per day of interdisciplinary therapy in a comprehensive inpatient rehab setting. Physiatrist is providing close team supervision and 24 hour management of active medical problems listed below. Physiatrist and rehab team continue to assess barriers to discharge/monitor patient progress toward functional and medical goals  Care Tool:  Bathing    Body parts bathed by patient: Right arm, Left upper leg, Left arm, Right lower leg, Chest, Abdomen, Left lower leg, Front perineal area, Buttocks, Face, Right upper leg         Bathing assist Assist Level: Contact Guard/Touching assist     Upper Body Dressing/Undressing Upper body dressing   What is the patient wearing?: Pull over shirt    Upper body assist Assist Level: Set up assist  Lower Body Dressing/Undressing Lower body dressing      What is the patient wearing?: Pants     Lower body assist Assist for lower body dressing: Contact Guard/Touching assist     Toileting Toileting    Toileting assist Assist for toileting: Minimal Assistance - Patient > 75%     Transfers Chair/bed transfer  Transfers assist     Chair/bed transfer assist level: Minimal Assistance - Patient > 75%     Locomotion Ambulation   Ambulation assist      Assist level: Minimal Assistance - Patient > 75% Assistive device: Walker-rolling Max distance: 60 ft   Walk 10 feet activity   Assist     Assist level: Contact Guard/Touching assist Assistive device: Walker-rolling   Walk 50 feet activity   Assist    Assist level: Minimal Assistance - Patient > 75% Assistive device: Walker-rolling     Walk 150 feet activity   Assist Walk 150 feet activity did not occur: Safety/medical concerns         Walk 10 feet on uneven surface  activity   Assist Walk 10 feet on uneven surfaces activity did not occur: Safety/medical concerns         Wheelchair     Assist Is the patient using a wheelchair?: Yes Type of Wheelchair: Manual    Wheelchair assist level: Minimal Assistance - Patient > 75%      Wheelchair 50 feet with 2 turns activity    Assist        Assist Level: Minimal Assistance - Patient > 75%   Wheelchair 150 feet activity     Assist      Assist Level: Contact Guard/Touching assist   Blood pressure 135/79, pulse (!) 59, temperature 97.6 F (36.4 C), resp. rate 19, height 5\' 10"  (1.778 m), weight 91.6 kg, SpO2 97 %.    Medical Problem List and Plan: 1. 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  Antithrombotics: -DVT/anticoagulation:  Pharmaceutical: Lovenox             -antiplatelet therapy: 12/27 restart ASA 81mg  3. Pain Management: continue tylenol prn. Gabapentin increase from 300mg  to 400mg  TID for neuropathy.  12/28- educated pt/wife why gabapentin was increased- hasn't made a difference so far 4. Mood/Behavior/Sleep: LCSW to follow for evaluation and support.              --trazodone prn for insomnia.              -antipsychotic agents: N/A 5. Neuropsych/cognition: This patient is capable of making decisions on his own behalf. 6. Skin/Wound Care: Routine pressure relief measures.  7. Fluids/Electrolytes/Nutrition: Monitor I/O.  8. HTN:  Monitor BP TID-currently hypotensive: d/c Cozaar. Now normotensive.   -well controlled     11/08/2022    5:12 AM 11/07/2022  9:08 PM 11/07/2022    7:24 PM  Vitals with BMI  Weight 201 lbs 15 oz    BMI 28.98    Systolic 135  111  Diastolic 79  74  Pulse 59 94 66    9. T2DM: Hgb A1C-6.5 and new diagnosis w/last admisison? Will recheck fasting level. --will add CM restrictions. Will d/c CBGs since in low 100s -glucose 110 on BMP today, stable 12/28- pt's wife doesn't think he has DM- I explained decreased movement/exercise over last 2 months could be why A1c is higher and suggest rechecking in 90 days.  10. AAA. Outpatient f/u 11. Emphysema. No signs of exacerbation at this time. Denies SOB 12. LE muscle soreness: kpad added, continue prn 13. History of anemia with low B12, Last level 506  -Restart multivitamin, restart home Iron supplement  I spent a total of 43   minutes on total care today- >50% coordination of care- due to d/w pt and wife about DM as documented above- also d/w pt and wife about GBS and prognosis-Base don his progress, I think he will likely be one of the 95% or so that gets 95% or more back- but usually fatigue/endurance are hardest tog et back over the 1 year post GBS.      LOS: 6 days A FACE TO FACE EVALUATION WAS PERFORMED  Tel Hevia 11/08/2022, 8:52 AM

## 2022-11-09 ENCOUNTER — Other Ambulatory Visit (HOSPITAL_COMMUNITY): Payer: Self-pay

## 2022-11-09 DIAGNOSIS — G610 Guillain-Barre syndrome: Secondary | ICD-10-CM | POA: Diagnosis not present

## 2022-11-09 NOTE — Progress Notes (Addendum)
Physical Therapy Discharge Summary  Patient Details  Name: Joe Greene MRN: [id number] Date of Birth: 06/17/1951  Date of Discharge from PT service:November 10, 2022  Today's Date: 11/10/2022 PT Individual Time: 1402-1443 PT Individual Time Calculation (min): 41 min    Patient has met 11 of 11 long term goals due to improved activity tolerance, improved balance, improved postural control, increased strength, improved attention, and improved coordination.  Patient to discharge at an ambulatory level Modified Independent for household ambulation and supervision in community setting.   Patient's care partner has been present throughout course of therapy and is independent to provide the necessary physical assistance at discharge.  Reasons goals not met: N/A all goals met  Recommendation:  Patient will benefit from ongoing skilled PT services in outpatient setting to continue to advance safe functional mobility, address ongoing impairments in strength, coordination, balance, activity tolerance, cognition, safety awareness, and to minimize fall risk.  Equipment: No equipment provided - pt has DME needs at home  Reasons for discharge: treatment goals met and discharge from hospital  Patient/family agrees with progress made and goals achieved: Yes  PT Discharge Precautions/Restrictions Precautions Precautions: Fall Precaution Comments: Neuropathy in Bil lower legs and feet, as well as Bil fingertips Restrictions Weight Bearing Restrictions: No Vital Signs Therapy Vitals Temp: 98 F (36.7 C) Temp Source: Oral Pulse Rate: 81 Resp: 17 BP: 118/63 Patient Position (if appropriate): Sitting Oxygen Therapy SpO2: 98 % O2 Device: Room Air Pain Pain Assessment Pain Scale: 0-10 Pain Score: 2  Pain Type: Acute pain Pain Location: Hip Pain Orientation: Left;Right Pain Descriptors / Indicators: Aching;Discomfort Pain Onset: On-going Pain Interference Pain Interference Pain Effect on  Sleep: 1. Rarely or not at all Pain Interference with Therapy Activities: 1. Rarely or not at all Pain Interference with Day-to-Day Activities: 1. Rarely or not at all Vision/Perception  Vision - History Ability to See in Adequate Light: 0 Adequate Perception Perception: Within Functional Limits Praxis Praxis: Intact  Cognition Overall Cognitive Status: Within Functional Limits for tasks assessed Arousal/Alertness: Awake/alert Orientation Level: Oriented X4 Year: 2023 Month: December Day of Week: Correct Memory: Appears intact Awareness: Appears intact Problem Solving: Appears intact Safety/Judgment: Appears intact Sensation Sensation Light Touch: Impaired Detail Central sensation comments: Impaired sensation distal to PIP's in B hands. Impaired in feet bilaterally and up shins. Able to localize some sensation in shins Light Touch Impaired Details: Impaired LLE;Impaired RLE;Impaired LUE;Impaired RUE Hot/Cold: Appears Intact Proprioception: Appears Intact Coordination Gross Motor Movements are Fluid and Coordinated: No Fine Motor Movements are Fluid and Coordinated: No Heel Shin Test: reduced coordination requiring zeroing in, but able to perform 9 Hole Peg Test: L 27.4 R 25.2 Motor  Motor Motor: Ataxia Motor - Discharge Observations: moderately improved, mildly present with ambulation  Mobility Bed Mobility Bed Mobility: Sit to Supine;Supine to Sit Supine to Sit: Independent Sit to Supine: Independent Transfers Transfers: Sit to Stand;Stand to Sit;Stand Pivot Transfers Sit to Stand: Independent with assistive device Stand to Sit: Independent with assistive device Stand Pivot Transfers: Independent with assistive device Transfer (Assistive device): Rolling walker Locomotion  Gait Ambulation: Yes Gait Assistance: Supervision/Verbal cueing Gait Distance (Feet): 200 Feet Gait Gait Pattern: Step-through pattern;Narrow base of support Gait velocity: decreased Stairs  / Additional Locomotion Stairs: Yes Stairs Assistance: Supervision/Verbal cueing Stair Management Technique: One rail Left;One rail Right Number of Stairs: 12 Height of Stairs: 6 Ramp: Supervision/Verbal cueing Curb: Supervision/Verbal cueing Wheelchair Mobility Wheelchair Propulsion: Both lower extermities Wheelchair Parts Management: Needs assistance Distance: 300  Trunk/Postural   Assessment  Cervical Assessment Cervical Assessment: Exceptions to [facility] (forward head) Thoracic Assessment Thoracic Assessment: Exceptions to [facility] (rounded shoulders) Lumbar Assessment Lumbar Assessment: Within Functional Limits Postural Control Postural Control: Within Functional Limits  Balance Balance Balance Assessed: Yes Standardized Balance Assessment Standardized Balance Assessment: Berg Balance Test Berg Balance Test Sit to Stand: Able to stand without using hands and stabilize independently Standing Unsupported: Able to stand 2 minutes with supervision Sitting with Back Unsupported but Feet Supported on Floor or Stool: Able to sit safely and securely 2 minutes Stand to Sit: Controls descent by using hands Transfers: Able to transfer safely, minor use of hands Standing Unsupported with Eyes Closed: Able to stand 10 seconds with supervision Standing Ubsupported with Feet Together: Able to place feet together independently and stand for 1 minute with supervision From Standing, Reach Forward with Outstretched Arm: Can reach confidently >25 cm (10") From Standing Position, Pick up Object from Floor: Able to pick up shoe, needs supervision From Standing Position, Turn to Look Behind Over each Shoulder: Looks behind one side only/other side shows less weight shift Turn 360 Degrees: Able to turn 360 degrees safely but slowly Standing Unsupported, Alternately Place Feet on Step/Stool: Able to stand independently and complete 8 steps >20 seconds Standing Unsupported, One Foot in Front: Able to take  small step independently and hold 30 seconds Standing on One Leg: Tries to lift leg/unable to hold 3 seconds but remains standing independently Total Score: 42 Static Sitting Balance Static Sitting - Balance Support: Feet supported Static Sitting - Level of Assistance: 7: Independent Dynamic Sitting Balance Dynamic Sitting - Balance Support: Feet supported Dynamic Sitting - Level of Assistance: 7: Independent Static Standing Balance Static Standing - Balance Support: During functional activity;Bilateral upper extremity supported Static Standing - Level of Assistance: 6: Modified independent (Device/Increase time) Dynamic Standing Balance Dynamic Standing - Balance Support: During functional activity;Bilateral upper extremity supported Dynamic Standing - Level of Assistance: 6: Modified independent (Device/Increase time) Functional Gait  Assessment Gait Level Surface: Walks 20 ft in less than 7 sec but greater than 5.5 sec, uses assistive device, slower speed, mild gait deviations, or deviates 6-10 in outside of the 12 in walkway width. Change in Gait Speed: Able to change speed, demonstrates mild gait deviations, deviates 6-10 in outside of the 12 in walkway width, or no gait deviations, unable to achieve a major change in velocity, or uses a change in velocity, or uses an assistive device. Gait with Horizontal Head Turns: Performs head turns with moderate changes in gait velocity, slows down, deviates 10-15 in outside 12 in walkway width but recovers, can continue to walk. Gait with Vertical Head Turns: Performs task with moderate change in gait velocity, slows down, deviates 10-15 in outside 12 in walkway width but recovers, can continue to walk. Gait and Pivot Turn: Turns slowly, requires verbal cueing, or requires several small steps to catch balance following turn and stop Step Over Obstacle: Is able to step over 2 stacked shoe boxes taped together (9 in total height) without changing gait  speed. No evidence of imbalance. Gait with Narrow Base of Support: Ambulates less than 4 steps heel to toe or cannot perform without assistance. Gait with Eyes Closed: Walks 20 ft, slow speed, abnormal gait pattern, evidence for imbalance, deviates 10-15 in outside 12 in walkway width. Requires more than 9 sec to ambulate 20 ft. Ambulating Backwards: Walks 20 ft, uses assistive device, slower speed, mild gait deviations, deviates 6-10 in outside 12 in walkway width.  Steps: Alternating feet, must use rail. Total Score: 15 FGA comment:: < 19 = high fall risk Extremity Assessment      RLE Assessment RLE Assessment: Exceptions to [facility] RLE Strength RLE Overall Strength: Deficits RLE Overall Strength Comments: improved Right Hip Flexion: 4/5 Right Hip Extension: 4-/5 Right Hip ABduction: 4/5 Right Hip ADduction: 4/5 Right Knee Flexion: 4-/5 Right Knee Extension: 4/5 Right Ankle Dorsiflexion: 4+/5 LLE Assessment LLE Assessment: Exceptions to [facility] LLE Strength LLE Overall Strength: Deficits Left Hip Flexion: 4+/5 Left Hip Extension: 4+/5 Left Hip ABduction: 4+/5 Left Hip ADduction: 4+/5 Left Knee Flexion: 4/5 Left Knee Extension: 4/5 Left Ankle Dorsiflexion: 4+/5 Left Ankle Plantar Flexion: 4+/5   Rosita DeChalus PTA 11/09/2022, 3:21 PM  Skilled Intervention Patient seated upright in recliner on entrance to room. Wife present. Patient alert and agreeable to PT session.   Patient with no pain complaint at start of session.  Therapeutic Activity: Transfers: Pt performed sit<>stand transfers throughout session with good control and no UE support when cued for control with BLE only. Stand pivot transfers using RW with Mod I and close supervision with no AD.   Gait Training:  Pt ambulated >250 ft using RW with supervision. Few demonstrations of off-balance stepping but corrected with good demo of stepping strategy.  Provided vc/ tc for maintaining level gaze.  Neuromuscular  Re-ed: NMR facilitated during session with focus on dynamic gait. Pt guided in functional gait assessment without use of AD.  Patient demonstrates increased fall risk as noted by score of 15/30 on Functional Gait Assessment.  (< 19 = high fall risk in general population) <22/30 = predictive of falls, <20/30 = fall in 6 months, <18/30 = predictive of falls in PD MCID: 5 points stroke population, 4 points geriatric population (ANPTA Core Set of Outcome Measures for Adults with Neurologic Conditions, 2018)   NMR performed for improvements in motor control and coordination, balance, sequencing, judgement, and self confidence/ efficacy in performing all aspects of mobility at highest level of independence.   Patient seated in w/c at end of session with brakes locked, no alarm set as wife present and preparing for d/c home, and all needs within reach.  Alger Simons PT, DPT, CSRS 11/10/2022, 4:28 PM

## 2022-11-09 NOTE — Progress Notes (Signed)
PROGRESS NOTE   Subjective/Complaints:  Pt reports slept somewhat better last night, but was still woken up around 5am for vitals-  LBM last night Just wants to get legs stronger.    ROS:   Pt denies SOB, abd pain, CP, N/V/C/D, and vision changes   Objective:   No results found. Recent Labs    11/06/22 0941  WBC 7.4  HGB 13.4  HCT 39.2  PLT 225   Recent Labs    11/06/22 0941  NA 140  K 3.9  CL 108  CO2 23  GLUCOSE 110*  BUN 12  CREATININE 0.83  CALCIUM 9.0    Intake/Output Summary (Last 24 hours) at 11/09/2022 1751 Last data filed at 11/09/2022 0700 Gross per 24 hour  Intake 1506 ml  Output --  Net 1506 ml        Physical Exam: Vital Signs Blood pressure 118/81, pulse 64, temperature 97.9 F (36.6 C), resp. rate 17, height 5\' 10"  (1.778 m), weight 89.1 kg, SpO2 97 %.    General: awake, alert, appropriate, sitting up in bed; finished NAD HENT: conjugate gaze; oropharynx moist CV: regular rate; no JVD Pulmonary: CTA B/L; no W/R/R- good air movement GI: soft, NT, ND, (+)BS Psychiatric: appropriate- less frustrated Neurological: Ox3- absent DTR's Neuro:  Alert and oriented x4, follows commands, normal speech and language, CN 2-12 grossly intact FTN intact b/l, HTS decreased b/l Strength 5/5 inb/l UE Strength 4+/5 in b/l LE DTR decreased throughout   Sensation decreased in distal hands and decreased bellow the knee bilaterally, absent sensation at b/l feet to LT Musculoskeletal: No joint swelling or tenderness Ambulating with RW CG   Assessment/Plan: 1. Functional deficits which require 3+ hours per day of interdisciplinary therapy in a comprehensive inpatient rehab setting. Physiatrist is providing close team supervision and 24 hour management of active medical problems listed below. Physiatrist and rehab team continue to assess barriers to discharge/monitor patient progress toward  functional and medical goals  Care Tool:  Bathing    Body parts bathed by patient: Right arm, Left upper leg, Left arm, Right lower leg, Chest, Abdomen, Left lower leg, Front perineal area, Buttocks, Face, Right upper leg         Bathing assist Assist Level: Contact Guard/Touching assist     Upper Body Dressing/Undressing Upper body dressing   What is the patient wearing?: Pull over shirt    Upper body assist Assist Level: Set up assist    Lower Body Dressing/Undressing Lower body dressing      What is the patient wearing?: Pants     Lower body assist Assist for lower body dressing: Contact Guard/Touching assist     Toileting Toileting    Toileting assist Assist for toileting: Minimal Assistance - Patient > 75%     Transfers Chair/bed transfer  Transfers assist     Chair/bed transfer assist level: Supervision/Verbal cueing     Locomotion Ambulation   Ambulation assist      Assist level: Contact Guard/Touching assist Assistive device: Walker-rolling Max distance: 2100ft   Walk 10 feet activity   Assist     Assist level: Contact Guard/Touching assist Assistive device: Walker-rolling   Walk  50 feet activity   Assist    Assist level: Contact Guard/Touching assist Assistive device: Walker-rolling    Walk 150 feet activity   Assist Walk 150 feet activity did not occur: Safety/medical concerns  Assist level: Contact Guard/Touching assist Assistive device: Walker-rolling    Walk 10 feet on uneven surface  activity   Assist Walk 10 feet on uneven surfaces activity did not occur: Safety/medical concerns   Assist level: Contact Guard/Touching assist Assistive device: Walker-rolling   Wheelchair     Assist Is the patient using a wheelchair?: Yes Type of Wheelchair: Manual    Wheelchair assist level: Minimal Assistance - Patient > 75%      Wheelchair 50 feet with 2 turns activity    Assist        Assist Level:  Minimal Assistance - Patient > 75%   Wheelchair 150 feet activity     Assist      Assist Level: Contact Guard/Touching assist   Blood pressure 118/81, pulse 64, temperature 97.9 F (36.6 C), resp. rate 17, height 5\' 10"  (1.778 m), weight 89.1 kg, SpO2 97 %.    Medical Problem List and Plan: 1. 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  Antithrombotics: -DVT/anticoagulation:  Pharmaceutical: Lovenox             -antiplatelet therapy: 12/27 restart ASA 81mg  3. Pain Management: continue tylenol prn. Gabapentin increase from 300mg  to 400mg  TID for neuropathy.  12/28- educated pt/wife why gabapentin was increased- hasn't made a difference so far  12/29- still tingling in feet 4. Mood/Behavior/Sleep: LCSW to follow for evaluation and support.              --trazodone prn for insomnia.              -antipsychotic agents: N/A 5. Neuropsych/cognition: This patient is capable of making decisions on his own behalf. 6. Skin/Wound Care: Routine pressure relief measures.  7. Fluids/Electrolytes/Nutrition: Monitor I/O.  8. HTN: Monitor BP TID-currently hypotensive: d/c Cozaar. Now normotensive.   12/29- well controlled- con't regimen     11/09/2022    4:48 AM 11/08/2022    7:58 PM 11/08/2022    3:40 PM  Vitals with BMI  Weight 196 lbs 7 oz    BMI 28.18    Systolic 118 135 1/30  Diastolic 81 72 68  Pulse 64 77 70    9. T2DM: Hgb A1C-6.5 and new diagnosis w/last admisison? Will recheck fasting level. --will add CM restrictions. Will d/c CBGs since in low 100s -glucose 110 on BMP today, stable 12/28- pt's wife doesn't think he has DM- I explained  decreased movement/exercise over last 2 months could be why A1c is higher and suggest rechecking in 90 days.  10. AAA. Outpatient f/u 11. Emphysema. No signs of exacerbation at this time. Denies SOB 12. LE muscle soreness: kpad added, continue prn 13. History of  anemia with low B12, Last level 506  -Restart multivitamin, restart home Iron supplement  Will verify HD catheter has been removed.       LOS: 7 days A FACE TO FACE EVALUATION WAS PERFORMED  Janelle Spellman 11/09/2022, 8:23 AM

## 2022-11-09 NOTE — Progress Notes (Signed)
Inpatient Rehabilitation Care Coordinator Discharge Note DC SUNDAY 12/31  Patient Details  Name: Joe Greene MRN: [id number] Date of Birth: 05-09-1951   Discharge location: HOME WITH WIFE WHO IS ABLE TO ASSIST-HERE DAILY AND PARTICIPATED IN THERAPIES  Length of Stay: 9 days  Discharge activity level: INDEPENDENT WITH DEVICE  Home/community participation: ACTIVE  Patient response UK:GURKYH Literacy - How often do you need to have someone help you when you read instructions, pamphlets, or other written material from your doctor or pharmacy?: Never  Patient response CW:CBJSEG Isolation - How often do you feel lonely or isolated from those around you?: Rarely  Services provided included: MD, RD, PT, OT, RN, CM, Pharmacy, SW  Financial Services:  Field seismologist Utilized: Scientific laboratory technician MEDICARE  Choices offered to/list presented to: PT AND WIFE  Follow-up services arranged:  Outpatient    Outpatient Servicies: [facility] [address]-PT & OT WILL CALL TO SET UP FOLLOW UP APPOINTMENTS HAS ALL EQUIPMENT FROM PAST ADMISSIONS      Patient response to transportation need: Is the patient able to respond to transportation needs?: Yes In the past 12 months, has lack of transportation kept you from medical appointments or from getting medications?: No In the past 12 months, has lack of transportation kept you from meetings, work, or from getting things needed for daily living?: No    Comments (or additional information):WIFE WAS HERE DAILY AND PARTICIPATED IN THERAPIES WITH HUSBAND. BOTH FEEL COMFORTABLE WITH DC HOME.   Patient/Family verbalized understanding of follow-up arrangements:  Yes  Individual responsible for coordination of the follow-up plan: JACKIE-WIFE [phone]  Confirmed correct DME delivered: Lucy Chris 11/09/2022    Aquan Kope, Lemar Livings

## 2022-11-09 NOTE — Progress Notes (Signed)
NIF= -40 VC= 1.15L With good pt effort.

## 2022-11-09 NOTE — Progress Notes (Signed)
Had a quite night. Respiratory therapist input last night (Breathing Exercises). Denies pain or SOB. Safety maintained.

## 2022-11-09 NOTE — Progress Notes (Addendum)
Occupational Therapy Session Note  Patient Details  Name: Joe Greene MRN: [id number] Date of Birth: 22-Aug-1951  Today's Date: 11/09/2022 OT Individual Time: 1300-1330 OT Individual Time Calculation (min): 30 min    Short Term Goals: Week 1:  OT Short Term Goal 1 (Week 1): STG= LTG d/t ELOS  Skilled Therapeutic Interventions/Progress Updates:    Pt resting in w/c upon arrival with wife present for education. Home safety education provided. Pt amb with RW (wife providing supervision/CGA) to ADL apartment for furniture transfers and practicing walk-in shower transfers. Pt with accessible shower with seat at home. Pt in hospital in November with GBS and was recovering well until current episode. Pt and wife demonstrated good safety awareness and verbalized understanding of all recommendations. Recommended pt check with OP therapists before visiting local gym. Pt returned to room and remained seated in w/c. Wife present. Pt missed 15 mins skilled OT services.  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

## 2022-11-09 NOTE — TOC Transition Note (Signed)
Discharge medications (1) are being stored in the main pharmacy on the ground floor until patient is ready for discharge.   

## 2022-11-09 NOTE — Progress Notes (Signed)
ned      NIF= -40 VC= 2.89L With good pt effort.

## 2022-11-09 NOTE — Progress Notes (Signed)
Occupational Therapy Session Note  Patient Details  Name: Joe Greene MRN: [id number] Date of Birth: 01/12/1951  Today's Date: 11/09/2022 OT Individual Time: 9563-8756 OT Individual Time Calculation (min): 71 min    Short Term Goals: Week 1:  OT Short Term Goal 1 (Week 1): STG= LTG d/t ELOS  Skilled Therapeutic Interventions/Progress Updates:   Pt sitting EOB on arrival. Amb with RW to day room. 4 mins NuStep BLE/BUE Level 5 followed by rest break. 3 mins level 3 BLE only. STanding task with 3# bar chest presses 2x10 with CGA. Table activities: finger strengthening exercieses, stacking quarters and pennies in conjunction with in hand manipulation tasks.    9 Hole Peg Test is used to measure finger dexterity in pts with various neurological diagnoses. - Instructions The pt was instructed to pick up the pegs one at a time, using their dominant hand first and put them into the holes in any order until the holes were all filled. The pt then removed the pegs one at a time and returned them to the container. Both hands were tested separately.  - Results The pt completed the test in 27.45 seconds with LUE and 25.18 seconds with RUE.  Scores are based on the time taken to complete the activity. The timer started the moment the pt touched the first peg until the moment the last peg hit the container.  - Norms for healthy males ages 52-70+ 71+ R 25.79 L 25.95  Pt amb with RW to gym and completed toss back with dodge ball onto mini trampoline 2x10 on floor with supervision. 3x10 on AirEx with CGA. Pt amb with RW back to room and returned to bed. Pt remained in bed with all needs within reach.     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

## 2022-11-09 NOTE — Progress Notes (Signed)
Physical Therapy Session Note  Patient Details  Name: Joe Greene MRN: [id number] Date of Birth: [birth year]  Today's Date: 11/09/2022 PT Individual Time: 1022-1050 and 1433-1530 PT Individual Time Calculation (min): 28 min and 57 min  Short Term Goals: Week 1:  PT Short Term Goal 1 (Week 1): Pt will perform standing transfers with consistent CGA. PT Short Term Goal 2 (Week 1): Pt will perform standing activities with minimal physical assistance and for at least 5 min. PT Short Term Goal 3 (Week 1): Pt will manage w/c parts with CGA/ supervision assst  Skilled Therapeutic Interventions/Progress Updates: Tx1: Pt presented in bed agreeable to therapy. Pt states mild pain in hip but unrated and no intervention required during session. Session focused on balance and gait without AD. Performed bed mobility mod I with use of bed features. Pt provided with RW and stood and ambulated to day room with supervision overall. At high low mat pt participated four square activities clockwise/counterclockwise and diagonals. Pt able to perform all directions overall light minA fading to CGA on counter/clockwise and requiring minA on diagonals with x 1 near LOB due to ankle rolling when stepping backwards diagonal to R. After seated rest pt ambulated back to room 252f without AD and CGA with pt demonstrating improved cadence, arm swing, and decreased lateral sway. Pt returned to bed at end of session mod I and left with call bell within reach and needs met.   Tx2: Pt presented in w/c with wife present agreeable to therapy. Pt c/o pain in hip but un rated and rest breaks provided as needed during session. Session focused on functional mobility in preparation for d/c with wife present for continued education. Pt propelled >3023fto ortho gym with supervision. Pt was able to brake and release leg rests without assist. Performed stand pivot transfer to car simulator without AD and with supervision. Pt got out of simulator  with RW and ambulated on ramp and mulch with supervision and CGA respectively. Pt then ambulated to main gym and participated in ascending/descending x 12 steps with 1 rail. Pt electively chose step to then progressed to step through pattern. Pt then participated in [place] with improved score of 42/56 from 26/56 with recommendation to continue to use RW for safety. (<36= high risk for falls, close to 100%; 37-45 significant >80%; 46-51 moderate >50%; 52-55 lower >25%). For increased challenge pt ambulated to day room to check weight. Pt able to ambulate with CGA but noted when decreased speed moderately increased lateral sway. After weigh in pt ambulated back to room in same manner without AD. Pt requesting to use bathroom and performed ambulatory transfer to toilet with continent void in standing. Pt handed off to wife during hand hygiene (wife signed off) with current needs met.       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

## 2022-11-10 NOTE — Progress Notes (Signed)
NIF -40 VC 2.71 Good pt effort.

## 2022-11-10 NOTE — Progress Notes (Signed)
Occupational Therapy Session Note  Patient Details  Name: [name] MRN: [id number] Date of Birth: [birth year]  Today's Date: 11/10/2022 OT Individual Time: 6144-3154 OT Individual Time Calculation (min): 44 min    Short Term Goals: Week 1:  OT Short Term Goal 1 (Week 1): STG= LTG d/t ELOS  Skilled Therapeutic Interventions/Progress Updates:    OT intervention with focus on functional amb with RW, dynamic standing balance, discharge planning, and safety awareness. Pt amb with RW to gym for standing activity on AirEx. Pt tossed dodge ball against rebounder while standing on AirEx 3x15 with occasional CGA. No LOB noted. Pt amb with RW to day room for game of Wii bowling without RW. Pt stood for entire game with no LOB. Pt returned to room and sat in w/c. Wife present. Pt pleased with progress and ready for discharge home after therapy.  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

## 2022-11-10 NOTE — Progress Notes (Signed)
Occupational Therapy Discharge Summary  Patient Details  Name: Joe Greene MRN: [id number] Date of Birth: 1951/07/16  Date of Discharge from OT service:November 10, 2022  Patient has met 7 of 7 long term goals due to improved activity tolerance, improved balance, postural control, ability to compensate for deficits, improved attention, improved awareness, and improved coordination.  Pt made excellent progress with BADLs and functional amb/transfers during this admission. Pt is mod I for all functional tranfsers and BADLs. Pt's wife has been present and assisted with therapy. Patient to discharge at overall  SBA-CGA  level.  Patient's care partner is independent to provide the necessary physical and cognitive assistance at discharge.    Reasons goals not met: n/a  Recommendation:  Patient will benefit from ongoing skilled OT services in outpatient setting to continue to advance functional skills in the area of BADL, iADL, and Reduce care partner burden.  Equipment: No equipment provided  Reasons for discharge: treatment goals met and discharge from hospital  Patient/family agrees with progress made and goals achieved: Yes  OT Discharge PADL ADL Eating: Independent Where Assessed-Eating: Chair Grooming: Independent Where Assessed-Grooming: Sitting at sink Upper Body Bathing: Modified independent Where Assessed-Upper Body Bathing: Shower Lower Body Bathing: Modified independent Where Assessed-Lower Body Bathing: Shower Upper Body Dressing: Independent Where Assessed-Upper Body Dressing: Chair Lower Body Dressing: Modified independent Where Assessed-Lower Body Dressing: Standing at sink, Chair Toileting: Modified independent Where Assessed-Toileting: Glass blower/designer: Diplomatic Services operational officer Method: Counselling psychologist: Energy manager: Chief Financial Officer Method: Heritage manager:  Civil engineer, contracting with back Vision Baseline Vision/History: 0 No visual deficits Patient Visual Report: No change from baseline Vision Assessment?: No apparent visual deficits Perception  Perception: Within Functional Limits Praxis Praxis: Intact Cognition Cognition Overall Cognitive Status: Within Functional Limits for tasks assessed Arousal/Alertness: Awake/alert Orientation Level: Person;Place;Situation Person: Oriented Place: Oriented Situation: Oriented Memory: Appears intact Awareness: Appears intact Problem Solving: Appears intact Safety/Judgment: Appears intact Brief Interview for Mental Status (BIMS) Repetition of Three Words (First Attempt): 3 Temporal Orientation: Year: Correct Temporal Orientation: Month: Accurate within 5 days Temporal Orientation: Day: Correct Recall: "Sock": Yes, no cue required Recall: "Blue": Yes, no cue required Recall: "Bed": Yes, no cue required BIMS Summary Score: 15 Sensation Sensation Light Touch: Impaired Detail Central sensation comments: Impaired sensation distal to PIP's in B hands. Impaired in feet bilaterally and up shins. Able to localize some sensation in shins Light Touch Impaired Details: Impaired LLE;Impaired RLE;Impaired LUE;Impaired RUE Hot/Cold: Appears Intact Proprioception: Appears Intact Stereognosis: Not tested Coordination Gross Motor Movements are Fluid and Coordinated: No Fine Motor Movements are Fluid and Coordinated: No 9 Hole Peg Test: L 27.4 R 25.2 Motor  Motor Motor: Ataxia Motor - Discharge Observations: moderately improved, mildly present with ambulation Trunk/Postural Assessment  Cervical Assessment Cervical Assessment: Exceptions to WFL (forward hed) Thoracic Assessment Thoracic Assessment: Exceptions to [facility] (rounded shoulders) Lumbar Assessment Lumbar Assessment: Within Functional Limits Postural Control Postural Control: Within Functional Limits Righting Reactions: Delayed  Balance Static  Sitting Balance Static Sitting - Balance Support: Feet supported Static Sitting - Level of Assistance: 7: Independent Dynamic Sitting Balance Dynamic Sitting - Balance Support: During functional activity Dynamic Sitting - Level of Assistance: 7: Independent Extremity/Trunk Assessment RUE Assessment RUE Assessment: Within Functional Limits LUE Assessment LUE Assessment: Within Functional Limits   Leroy Libman 11/10/2022, 7:06 AM   Ailene Ravel, OTR/L,CBIS  Supplemental OT - [facility] and [facility] 11/10/22

## 2022-11-13 ENCOUNTER — Ambulatory Visit: Payer: Medicare PPO | Admitting: Physical Therapy

## 2022-11-13 NOTE — Telephone Encounter (Signed)
I called pt and we discussed how he is feeling. Reports over all doing ok he has noticed legs feel weaker, along with increased numbness/tingling since d/c in bilateral legs. He is agreeable to follow up appt and scheduled for 11/21/2021 at 1130 with Dr. Krista Blue.

## 2022-11-13 NOTE — Progress Notes (Signed)
Inpatient Rehabilitation Discharge Medication Review by a Pharmacist  A complete drug regimen review was completed for this patient to identify any potential clinically significant medication issues.  High Risk Drug Classes Is patient taking? Indication by Medication  Antipsychotic No   Anticoagulant No   Antibiotic No   Opioid No   Antiplatelet Yes Asprin-antiplatelet therapy  Hypoglycemics/insulin Yes Sliding Scale insulin - DM  Vasoactive Medication No   Chemotherapy No   Other Yes Ferrous sulfate- h/o anemia, iron deficiency Neurontin-neuropathy Nicotine patch- smoking cessation      Type of Medication Issue Identified Description of Issue Recommendation(s)  Drug Interaction(s) (clinically significant)     Duplicate Therapy     Allergy     No Medication Administration End Date     Incorrect Dose     Additional Drug Therapy Needed     Significant med changes from prior encounter (inform family/care partners about these prior to discharge). Cozaar  (Losartan)-HTN  Cozaar (losartan) was held and he will will need outpatient follow-up for input on resumption  Other       Clinically significant medication issues were identified that warrant physician communication and completion of prescribed/recommended actions by midnight of the next day:  No  Name of provider notified for urgent issues identified:   Provider Method of Notification:     Pharmacist comments:   Time spent performing this drug regimen review (minutes):  [place], RPh Clinical Pharmacist  11/13/2022 3:56 PM

## 2022-11-14 ENCOUNTER — Encounter: Payer: Self-pay | Admitting: Rehabilitation

## 2022-11-14 ENCOUNTER — Ambulatory Visit: Payer: Medicare PPO | Attending: Family Medicine | Admitting: Rehabilitation

## 2022-11-14 DIAGNOSIS — Z1152 Encounter for screening for COVID-19: Secondary | ICD-10-CM | POA: Diagnosis not present

## 2022-11-14 DIAGNOSIS — Z720 Tobacco use: Secondary | ICD-10-CM | POA: Diagnosis not present

## 2022-11-14 DIAGNOSIS — E781 Pure hyperglyceridemia: Secondary | ICD-10-CM | POA: Diagnosis present

## 2022-11-14 DIAGNOSIS — R7303 Prediabetes: Secondary | ICD-10-CM | POA: Diagnosis present

## 2022-11-14 DIAGNOSIS — R2689 Other abnormalities of gait and mobility: Secondary | ICD-10-CM

## 2022-11-14 DIAGNOSIS — Z79899 Other long term (current) drug therapy: Secondary | ICD-10-CM | POA: Diagnosis not present

## 2022-11-14 DIAGNOSIS — Z8042 Family history of malignant neoplasm of prostate: Secondary | ICD-10-CM | POA: Diagnosis not present

## 2022-11-14 DIAGNOSIS — R278 Other lack of coordination: Secondary | ICD-10-CM | POA: Insufficient documentation

## 2022-11-14 DIAGNOSIS — R2681 Unsteadiness on feet: Secondary | ICD-10-CM

## 2022-11-14 DIAGNOSIS — I2510 Atherosclerotic heart disease of native coronary artery without angina pectoris: Secondary | ICD-10-CM | POA: Diagnosis present

## 2022-11-14 DIAGNOSIS — E785 Hyperlipidemia, unspecified: Secondary | ICD-10-CM | POA: Diagnosis present

## 2022-11-14 DIAGNOSIS — Z7982 Long term (current) use of aspirin: Secondary | ICD-10-CM | POA: Diagnosis not present

## 2022-11-14 DIAGNOSIS — M6281 Muscle weakness (generalized): Secondary | ICD-10-CM

## 2022-11-14 DIAGNOSIS — R208 Other disturbances of skin sensation: Secondary | ICD-10-CM | POA: Insufficient documentation

## 2022-11-14 DIAGNOSIS — G6181 Chronic inflammatory demyelinating polyneuritis: Secondary | ICD-10-CM | POA: Diagnosis present

## 2022-11-14 DIAGNOSIS — I10 Essential (primary) hypertension: Secondary | ICD-10-CM | POA: Diagnosis present

## 2022-11-14 DIAGNOSIS — G610 Guillain-Barre syndrome: Secondary | ICD-10-CM | POA: Diagnosis present

## 2022-11-14 DIAGNOSIS — Z823 Family history of stroke: Secondary | ICD-10-CM | POA: Diagnosis not present

## 2022-11-14 DIAGNOSIS — J449 Chronic obstructive pulmonary disease, unspecified: Secondary | ICD-10-CM | POA: Diagnosis present

## 2022-11-14 DIAGNOSIS — K5900 Constipation, unspecified: Secondary | ICD-10-CM | POA: Diagnosis present

## 2022-11-14 DIAGNOSIS — Z87891 Personal history of nicotine dependence: Secondary | ICD-10-CM | POA: Diagnosis not present

## 2022-11-14 NOTE — Therapy (Signed)
OUTPATIENT PHYSICAL THERAPY NEURO EVALUATION   Patient Name: Joe Greene MRN: [id number] DOB:[birth year], 72 y.o., male Today's Date: 11/14/2022   PCP: Arlester Marker, MD REFERRING PROVIDER: Marlowe Shores, PA-C  END OF SESSION:  PT End of Session - 11/14/22 1022     Visit Number 1    Number of Visits 25    Date for PT Re-Evaluation 01/13/23    Authorization Type [employer] [place]    Progress Note Due on Visit 10    PT Start Time 0354    PT Stop Time 1103    PT Time Calculation (min) 48 min    Activity Tolerance Patient tolerated treatment well    Behavior During Therapy WFL for tasks assessed/performed             Past Medical History:  Diagnosis Date   AAA (abdominal aortic aneurysm) ([place])    Cataract    forming    Past Surgical History:  Procedure Laterality Date   FOOT SURGERY Right    HERNIA REPAIR Right    IR FLUORO GUIDE CV LINE RIGHT  10/25/2022   IR US GUIDE VASC ACCESS RIGHT  10/25/2022   KNEE ARTHROSCOPY Left    WISDOM TOOTH EXTRACTION     Patient Active Problem List   Diagnosis Date Noted   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[place]) 11/02/2022   Acute sensory neuropathy 10/25/2022   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[place]) 10/18/2022   B12 deficiency 10/18/2022   Gait abnormality 10/18/2022   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 10/10/2022   Pre-diabetes 09/25/2022   AAA (abdominal aortic aneurysm) ([place]) 09/24/2022   Essential hypertension 09/24/2022   Iron deficiency 04/04/2022   Hypertriglyceridemia 04/03/2022   Coronary artery calcification 01/18/2022   Candidal intertrigo    Emphysema of lung ([place]) 07/26/2021   Aortic atherosclerosis ([place]) 07/26/2021   Internal hemorrhoids 01/23/2021   Diverticula of colon 01/23/2021   Eczema 01/23/2021   Actinic keratoses 01/23/2021   Tobacco use 09/02/2020    ONSET DATE: 11/06/2022 (referral date)  REFERRING  DIAG: AIDP, GBS  THERAPY DIAG:  Muscle weakness (generalized)  Unsteadiness on feet  Other abnormalities of gait and mobility  Rationale for Evaluation and Treatment: Rehabilitation  SUBJECTIVE:                                                                                                                                                                                             SUBJECTIVE STATEMENT: Per pt report, "My legs are tired and weak.  They feel more weak than when I left the hospital.  I  don't think my balance is as good as when I left the hosptial."   Pt accompanied by: significant other Joe Greene)  PERTINENT HISTORY: Patient is in today for follow-up from his recent hospitalization. Joe Greene was admitted at [facility] from 11/13-11/20/2023. He presented with right leg weakness and ascending numbness of his fingers and toes. He had extensive work up. 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 He was treated with a course of IVIG. He underwent some initial physical therapy. Due to some logistical challenges, the decision was made to discharge him to home, rather than move to inpatient rehab.   Since returning home, Joe Greene notes he had fluctuating weakness in the legs. Therefore returned to [place] and was hospitalized with new diagnosis of AIDP on 11/02/22, went to inpatient rehab and then returned home 11/10/22.   Other issues were identified during his hospital stay, including some hyperglycemia with an A1c of 6.5%, a mildly high TSH with a normal T4, elevated blood pressures, and some dyspnea issues. His blood pressure was high enough that he was started on losartan 25 mg daily. He has been trying to quit smoking now in light of his current issues. Additionally, the hospital noted his infrarenal AAA.  PAIN:  Are you having pain? Yes: NPRS scale: 5/10 Pain location: L hip  Pain description: Sharp  Aggravating factors: when sitting certain ways Relieving  factors: heat helps, readjust   PRECAUTIONS: Fall  WEIGHT BEARING RESTRICTIONS: No  FALLS: Has patient fallen in last 6 months? No  LIVING ENVIRONMENT: Lives with: lives with their family Lives in: House/apartment Stairs: No Has following equipment at home: Environmental consultant - 2 wheeled, shower chair, and Grab bars  PLOF: Requires assistive device for independence, Needs assistance with ADLs, and Needs assistance with homemaking  PATIENT GOALS: "I want to get back to normal."   OBJECTIVE:   DIAGNOSTIC FINDINGS: Most recent nerve testing revealed AIDP  COGNITION: Overall cognitive status: Within functional limits for tasks assessed   SENSATION: Light touch: Impaired B numbness and tingling up to thighs and also in B hands  COORDINATION: WFL heel to shin     POSTURE: rounded shoulders and forward head Has to sit off of L hip at times due to pain   LOWER EXTREMITY ROM:      Right Eval Left Eval  Hip flexion    Hip extension    Hip abduction    Hip adduction    Hip internal rotation    Hip external rotation    Knee flexion    Knee extension    Ankle dorsiflexion    Ankle plantarflexion    Ankle inversion    Ankle eversion     (Blank rows = not tested)Grossly WFL  LOWER EXTREMITY MMT:    MMT Right Eval Left Eval  Hip flexion 3+/5 3+/5  Hip extension    Hip abduction 4/5 4/5  Hip adduction 4/5 4/5  Hip internal rotation    Hip external rotation    Knee flexion 3+/5 3+/5  Knee extension 4/5 4/5  Ankle dorsiflexion 3+/5 3+/5  Ankle plantarflexion    Ankle inversion    Ankle eversion    (Blank rows = not tested) All tested in seated position  BED MOBILITY:  Reports mild difficulty, but is completing on his own  TRANSFERS: Assistive device utilized: Environmental consultant - 2 wheeled  Sit to stand: CGA Stand to sit: SBA Chair to chair: CGA Floor:  n/a    STAIRS: Level of Assistance:  CGA Stair Negotiation Technique: Alternating Pattern  with Bilateral Rails Number of  Stairs: 4  Height of Stairs: 6  Comments: Pt with much more weakness when descending stairs, less control noted.   GAIT: Gait pattern: step through pattern, decreased stride length, decreased ankle dorsiflexion- Right, decreased ankle dorsiflexion- Left, lateral hip instability, and wide BOS Distance walked: 200' in session with RW, TUG without AD and Min A  Assistive device utilized: Environmental consultant - 2 wheeled Level of assistance: SBA and Min A Comments: Pt has most difficulty with turns, esp without AD.   FUNCTIONAL TESTS:  5 times sit to stand: 18.63 secs with B UE support  Timed up and go (TUG): 22.63 secs without AD with min A 10 meter walk test: 2.38 ft/sec with RW Berg Balance Scale: 33/56 (down from 42/56 when left CIR)   [facility] PT Assessment - 11/14/22 1045       Standardized Balance Assessment   Standardized Balance Assessment Berg Balance Test      Berg Balance Test   Sit to Stand Able to stand  independently using hands    Standing Unsupported Able to stand 2 minutes with supervision    Sitting with Back Unsupported but Feet Supported on Floor or Stool Able to sit safely and securely 2 minutes    Stand to Sit Sits safely with minimal use of hands    Transfers Able to transfer safely, definite need of hands    Standing Unsupported with Eyes Closed Able to stand 3 seconds    Standing Unsupported with Feet Together Able to place feet together independently and stand for 1 minute with supervision    From Standing, Reach Forward with Outstretched Arm Reaches forward but needs supervision    From Standing Position, Pick up Object from Floor Able to pick up shoe, needs supervision    From Standing Position, Turn to Look Behind Over each Shoulder Needs supervision when turning    Turn 360 Degrees Needs close supervision or verbal cueing    Standing Unsupported, Alternately Place Feet on Step/Stool Able to complete >2 steps/needs minimal assist    Standing Unsupported, One Foot in Front  Able to plae foot ahead of the other independently and hold 30 seconds    Standing on One Leg Tries to lift leg/unable to hold 3 seconds but remains standing independently    Total Score 33               TODAY'S TREATMENT:                                                                                                                              DATE: no treatment initiated today     PATIENT EDUCATION: Education details: Educated on evaluation results, POC, goals Person educated: Patient and Spouse Education method: Explanation and Verbal cues Education comprehension: verbalized understanding and needs further education  HOME EXERCISE PROGRAM: No HEP initiated today   GOALS: Goals reviewed  with patient? Yes  SHORT TERM GOALS: Target date: 12/15/22  Pt will be independent with initial HEP for improved strength, balance, transfers and gait. Baseline: Goal status: INITIAL   2.  Pt will improve gait velocity to at least 2.75 ft/sec for improved gait efficiency and performance at S level  Baseline: 2.38 ft/sec with CGA and RW (1/3) Goal status: INITIAL   3.  2MWT vs 6MWT to be assessed and STG written Baseline:  Goal status: INITIAL  4. Pt will improve BERG balance score 4 points from baseline in order to indicate dec fall risk.   Baseline: 33/56  Goal Status: Initial      LONG TERM GOALS: Target date: 01/13/2023   Pt will be independent with final HEP for improved strength, balance, transfers and gait. Baseline:  Goal status: INITIAL   2. Pt will improve gait velocity to at least 3.0 ft/sec with cane or no AD for improved gait efficiency and performance at Supervision level  Baseline: 2.38 ft/sec with RW and CGA Goal status: INITIAL   3.  Pt will improve 5 x STS to less than or equal to 15 seconds without UE support to demonstrate improved functional strength and transfer efficiency.  Baseline: 18.63 with BUE support  Goal status: INITIAL   4.  Pt will improve  normal TUG to less than or equal to 18 seconds without AD for improved functional mobility and decreased fall risk. Baseline: 15.97 sec (12/8) Goal status: INITIAL   5.  2MWT vs 6MWT to be assessed and LTG written Baseline:  Goal status: INITIAL  6. Pt will improve BERG balance score 8 points from baseline in order to indicate dec fall risk.   Baseline: 33/56  Goal Status: INITIAL  7.  Pt will negotiate up/down 4 steps with single rail/cane, up/down ramp and curb and ambulate x 500' outdoors over unlevel paved surfaces at S level with cane or less in order to indicate improved community mobility.   Baseline:    Goal Status: INITIAL    ASSESSMENT:  CLINICAL IMPRESSION: Patient is a 72 y.o. male who was seen today for physical therapy evaluation and treatment for AIDP.   Mr. Arredondo was admitted at [facility] from 11/13-11/20/2023. He presented with right leg weakness and ascending numbness of his fingers and toes. He had extensive work up. 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 He was treated with a course of IVIG. He underwent some initial physical therapy. He was sent home and came to this clinic for OP PT.  Noted to have increased symptoms of N/T and weakness.    Since returning home, Mr. Burggraf notes he has fluctuating weakness in the legs. He went to [facility] and was hospitalized again 12/22 with AIDP, getting another round of IVIG and going to inpatient rehab until 11/10/22 when he [place] home. Other issues were identified during his hospital stay, including some hyperglycemia with an A1c of 6.5%, a mildly high TSH with a normal T4, elevated blood pressures, and some dyspnea issues. His blood pressure was high enough that he was started on losartan 25 mg daily. He has been trying to quit smoking now in light of his current issues. Additionally, the hospital noted his infrarenal AAA.    Upon PT evaluation, note decreased sensation in BLEs, decreased gait speed (below community  level), decreased functional strength and BERG score of 33/56 indicative of high fall risk.    OBJECTIVE IMPAIRMENTS: Abnormal gait, cardiopulmonary status limiting activity, decreased activity tolerance,  decreased balance, decreased endurance, decreased knowledge of use of DME, decreased mobility, difficulty walking, decreased strength, impaired perceived functional ability, impaired sensation, impaired UE functional use, improper body mechanics, and postural dysfunction.   ACTIVITY LIMITATIONS: carrying, lifting, bending, standing, squatting, stairs, transfers, bathing, reach over head, and locomotion level  PARTICIPATION LIMITATIONS: meal prep, cleaning, laundry, driving, shopping, community activity, and yard work  PERSONAL FACTORS: Past/current experiences and 3+ comorbidities: see above  are also affecting patient's functional outcome.   REHAB POTENTIAL: Excellent  CLINICAL DECISION MAKING: Evolving/moderate complexity  EVALUATION COMPLEXITY: Moderate  PLAN:  PT FREQUENCY: 3x/week  PT DURATION: 8 weeks  PLANNED INTERVENTIONS: Therapeutic exercises, Therapeutic activity, Neuromuscular re-education, Balance training, Gait training, Patient/Family education, Self Care, Stair training, Vestibular training, DME instructions, and Aquatic Therapy  PLAN FOR NEXT SESSION: 2MWT vs 6MWT and update goals, initiated HEP for BLE strengthening and balance (sit<>stand, hip abd, ext, ankle DF), gait with RW with obstacles and turns, activities without AD as able.    Cameron Sprang, PT, MPT [facility] [address] [place], [place], [address] Phone: [phone]   Fax:  [phone] 11/14/22, 12:17 PM

## 2022-11-15 ENCOUNTER — Telehealth: Payer: Self-pay | Admitting: Neurology

## 2022-11-15 ENCOUNTER — Encounter: Payer: Medicare PPO | Admitting: Occupational Therapy

## 2022-11-15 ENCOUNTER — Ambulatory Visit: Payer: Medicare PPO | Admitting: Physical Therapy

## 2022-11-15 ENCOUNTER — Other Ambulatory Visit: Payer: Self-pay

## 2022-11-15 ENCOUNTER — Inpatient Hospital Stay (HOSPITAL_COMMUNITY)
Admission: EM | Admit: 2022-11-15 | Discharge: 2022-11-19 | DRG: 095 | Disposition: A | Discharge status: Home Health | Payer: Medicare PPO | Attending: Internal Medicine | Admitting: Internal Medicine

## 2022-11-15 ENCOUNTER — Inpatient Hospital Stay (HOSPITAL_COMMUNITY): Payer: Medicare PPO

## 2022-11-15 DIAGNOSIS — Z8042 Family history of malignant neoplasm of prostate: Secondary | ICD-10-CM

## 2022-11-15 DIAGNOSIS — Z7982 Long term (current) use of aspirin: Secondary | ICD-10-CM | POA: Diagnosis not present

## 2022-11-15 DIAGNOSIS — I2510 Atherosclerotic heart disease of native coronary artery without angina pectoris: Secondary | ICD-10-CM | POA: Diagnosis present

## 2022-11-15 DIAGNOSIS — Z823 Family history of stroke: Secondary | ICD-10-CM | POA: Diagnosis not present

## 2022-11-15 DIAGNOSIS — I10 Essential (primary) hypertension: Secondary | ICD-10-CM | POA: Diagnosis present

## 2022-11-15 DIAGNOSIS — G6181 Chronic inflammatory demyelinating polyneuritis: Secondary | ICD-10-CM | POA: Diagnosis present

## 2022-11-15 DIAGNOSIS — J449 Chronic obstructive pulmonary disease, unspecified: Secondary | ICD-10-CM | POA: Diagnosis present

## 2022-11-15 DIAGNOSIS — Z87891 Personal history of nicotine dependence: Secondary | ICD-10-CM | POA: Diagnosis not present

## 2022-11-15 DIAGNOSIS — R7303 Prediabetes: Secondary | ICD-10-CM | POA: Diagnosis not present

## 2022-11-15 DIAGNOSIS — Z720 Tobacco use: Secondary | ICD-10-CM

## 2022-11-15 DIAGNOSIS — K5900 Constipation, unspecified: Secondary | ICD-10-CM | POA: Diagnosis present

## 2022-11-15 DIAGNOSIS — Z79899 Other long term (current) drug therapy: Secondary | ICD-10-CM

## 2022-11-15 DIAGNOSIS — G610 Guillain-Barre syndrome: Principal | ICD-10-CM | POA: Diagnosis present

## 2022-11-15 DIAGNOSIS — E785 Hyperlipidemia, unspecified: Secondary | ICD-10-CM | POA: Diagnosis present

## 2022-11-15 DIAGNOSIS — E781 Pure hyperglyceridemia: Secondary | ICD-10-CM | POA: Diagnosis present

## 2022-11-15 DIAGNOSIS — Z1152 Encounter for screening for COVID-19: Secondary | ICD-10-CM | POA: Diagnosis not present

## 2022-11-15 LAB — RESP PANEL BY RT-PCR (RSV, FLU A&B, COVID)  RVPGX2
Influenza A by PCR: NEGATIVE
Influenza B by PCR: NEGATIVE
Resp Syncytial Virus by PCR: NEGATIVE
SARS Coronavirus 2 by RT PCR: NEGATIVE

## 2022-11-15 LAB — URINALYSIS, ROUTINE W REFLEX MICROSCOPIC
Bilirubin Urine: NEGATIVE
Glucose, UA: NEGATIVE mg/dL
Hgb urine dipstick: NEGATIVE
Ketones, ur: NEGATIVE mg/dL
Leukocytes,Ua: NEGATIVE
Nitrite: NEGATIVE
Protein, ur: NEGATIVE mg/dL
Specific Gravity, Urine: 1.016 (ref 1.005–1.030)
pH: 5 (ref 5.0–8.0)

## 2022-11-15 LAB — CBC WITH DIFFERENTIAL/PLATELET
Abs Immature Granulocytes: 0.01 10*3/uL (ref 0.00–0.07)
Basophils Absolute: 0.1 10*3/uL (ref 0.0–0.1)
Basophils Relative: 1 %
Eosinophils Absolute: 0.7 10*3/uL — ABNORMAL HIGH (ref 0.0–0.5)
Eosinophils Relative: 10 %
HCT: 43.3 % (ref 39.0–52.0)
Hemoglobin: 14.5 g/dL (ref 13.0–17.0)
Immature Granulocytes: 0 %
Lymphocytes Relative: 29 %
Lymphs Abs: 2 10*3/uL (ref 0.7–4.0)
MCH: 33.1 pg (ref 26.0–34.0)
MCHC: 33.5 g/dL (ref 30.0–36.0)
MCV: 98.9 fL (ref 80.0–100.0)
Monocytes Absolute: 0.7 10*3/uL (ref 0.1–1.0)
Monocytes Relative: 9 %
Neutro Abs: 3.6 10*3/uL (ref 1.7–7.7)
Neutrophils Relative %: 51 %
Platelets: 283 10*3/uL (ref 150–400)
RBC: 4.38 MIL/uL (ref 4.22–5.81)
RDW: 12.4 % (ref 11.5–15.5)
WBC: 7 10*3/uL (ref 4.0–10.5)
nRBC: 0 % (ref 0.0–0.2)

## 2022-11-15 LAB — URINALYSIS, COMPLETE (UACMP) WITH MICROSCOPIC
Bacteria, UA: NONE SEEN
Bilirubin Urine: NEGATIVE
Glucose, UA: NEGATIVE mg/dL
Hgb urine dipstick: NEGATIVE
Ketones, ur: NEGATIVE mg/dL
Leukocytes,Ua: NEGATIVE
Nitrite: NEGATIVE
Protein, ur: NEGATIVE mg/dL
Specific Gravity, Urine: 1.014 (ref 1.005–1.030)
pH: 6 (ref 5.0–8.0)

## 2022-11-15 LAB — COMPREHENSIVE METABOLIC PANEL
ALT: 27 U/L (ref 0–44)
AST: 20 U/L (ref 15–41)
Albumin: 4.1 g/dL (ref 3.5–5.0)
Alkaline Phosphatase: 37 U/L — ABNORMAL LOW (ref 38–126)
Anion gap: 13 (ref 5–15)
BUN: 18 mg/dL (ref 8–23)
CO2: 21 mmol/L — ABNORMAL LOW (ref 22–32)
Calcium: 9.3 mg/dL (ref 8.9–10.3)
Chloride: 102 mmol/L (ref 98–111)
Creatinine, Ser: 0.88 mg/dL (ref 0.61–1.24)
GFR, Estimated: >60 mL/min (ref >60)
Glucose, Bld: 117 mg/dL — ABNORMAL HIGH (ref 70–99)
Potassium: 4.2 mmol/L (ref 3.5–5.1)
Sodium: 136 mmol/L (ref 135–145)
Total Bilirubin: 0.5 mg/dL (ref 0.3–1.2)
Total Protein: 6.7 g/dL (ref 6.5–8.1)

## 2022-11-15 LAB — PHOSPHORUS: Phosphorus: 4.6 mg/dL (ref 2.5–4.6)

## 2022-11-15 LAB — TSH: TSH: 4.832 u[IU]/mL — ABNORMAL HIGH (ref 0.350–4.500)

## 2022-11-15 LAB — MAGNESIUM: Magnesium: 2.2 mg/dL (ref 1.7–2.4)

## 2022-11-15 LAB — CK: Total CK: 69 U/L (ref 49–397)

## 2022-11-15 MED ORDER — IMMUNE GLOBULIN (HUMAN) 10 GM/100ML IV SOLN
400.0000 mg/kg | INTRAVENOUS | Status: DC
  Administered 2022-11-16: 30 g via INTRAVENOUS
  Filled 2022-11-15 (×2): qty 300

## 2022-11-15 MED ORDER — SENNOSIDES-DOCUSATE SODIUM 8.6-50 MG PO TABS
1.0000 | ORAL_TABLET | Freq: Every day | ORAL | Status: DC
  Administered 2022-11-16 – 2022-11-18 (×4): 1 via ORAL
  Filled 2022-11-15 (×4): qty 1

## 2022-11-15 MED ORDER — FERROUS SULFATE 325 (65 FE) MG PO TBEC: 325.0000 mg | DELAYED_RELEASE_TABLET | Freq: Every day | ORAL | Status: DC

## 2022-11-15 MED ORDER — SODIUM CHLORIDE 0.9 % IV BOLUS: 500.0000 mL | INTRAVENOUS | Status: DC

## 2022-11-15 MED ORDER — ALBUTEROL SULFATE (2.5 MG/3ML) 0.083% IN NEBU: 2.5000 mg | INHALATION_SOLUTION | RESPIRATORY_TRACT | Status: DC | PRN

## 2022-11-15 MED ORDER — HYDROCODONE-ACETAMINOPHEN 5-325 MG PO TABS
1.0000 | ORAL_TABLET | ORAL | Status: DC | PRN
  Administered 2022-11-17 (×2): 2 via ORAL
  Filled 2022-11-15 (×2): qty 2

## 2022-11-15 MED ORDER — SODIUM CHLORIDE 0.9 % IV SOLN: INTRAVENOUS | Status: AC

## 2022-11-15 MED ORDER — ACETAMINOPHEN 325 MG PO TABS
650.0000 mg | ORAL_TABLET | Freq: Four times a day (QID) | ORAL | Status: DC | PRN
  Administered 2022-11-16 – 2022-11-18 (×2): 650 mg via ORAL
  Filled 2022-11-15 (×2): qty 2

## 2022-11-15 MED ORDER — FERROUS SULFATE 325 (65 FE) MG PO TABS
325.0000 mg | ORAL_TABLET | Freq: Every day | ORAL | Status: DC
  Administered 2022-11-16 – 2022-11-19 (×4): 325 mg via ORAL
  Filled 2022-11-15 (×4): qty 1

## 2022-11-15 MED ORDER — SODIUM CHLORIDE 0.9 % IV BOLUS
500.0000 mL | INTRAVENOUS | Status: DC
  Administered 2022-11-16: 500 mL via INTRAVENOUS

## 2022-11-15 MED ORDER — DOCUSATE SODIUM 100 MG PO CAPS
100.0000 mg | ORAL_CAPSULE | Freq: Two times a day (BID) | ORAL | Status: DC
  Administered 2022-11-16 – 2022-11-19 (×8): 100 mg via ORAL
  Filled 2022-11-15 (×8): qty 1

## 2022-11-15 MED ORDER — ACETAMINOPHEN 650 MG RE SUPP: 650.0000 mg | Freq: Four times a day (QID) | RECTAL | Status: DC | PRN

## 2022-11-15 MED ORDER — SODIUM CHLORIDE 0.9 % IV BOLUS
500.0000 mL | INTRAVENOUS | Status: DC
  Administered 2022-11-16 – 2022-11-17 (×2): 500 mL via INTRAVENOUS

## 2022-11-15 MED ORDER — ASPIRIN 81 MG PO TBEC
81.0000 mg | DELAYED_RELEASE_TABLET | Freq: Every morning | ORAL | Status: DC
  Administered 2022-11-16 – 2022-11-19 (×4): 81 mg via ORAL
  Filled 2022-11-15 (×4): qty 1

## 2022-11-15 MED ORDER — IMMUNE GLOBULIN (HUMAN) 10 GM/100ML IV SOLN: 400.0000 mg/kg | INTRAVENOUS | Status: DC

## 2022-11-15 MED ORDER — GABAPENTIN 400 MG PO CAPS
400.0000 mg | ORAL_CAPSULE | Freq: Three times a day (TID) | ORAL | Status: DC
  Administered 2022-11-16 – 2022-11-18 (×8): 400 mg via ORAL
  Filled 2022-11-15 (×9): qty 1

## 2022-11-15 MED ORDER — NICOTINE 21 MG/24HR TD PT24
21.0000 mg | MEDICATED_PATCH | Freq: Every day | TRANSDERMAL | Status: DC
  Administered 2022-11-16 – 2022-11-19 (×4): 21 mg via TRANSDERMAL
  Filled 2022-11-15 (×4): qty 1

## 2022-11-15 NOTE — H&P (Signed)
Joe Greene EKC:[id number] DOB: 09-28-1951 DOA: 11/15/2022     PCP: Haydee Salter, MD   Outpatient Specialists:   CT surgery:  Dr. Donzetta Matters   NEurology   Dr. Krista Blue Pulmonary Elie Confer Lars Masson, NP   GI Dr. Hilarie Fredrickson  Sadie Haber, [facility])   Patient arrived to ER on 11/15/22 at 0720 Referred by Attending Charlesetta Shanks, MD   Patient coming from:    home Lives  With family    Chief Complaint:   Chief Complaint  Patient presents with   Tingling   Numbness    HPI: Joe Greene is a 72 y.o. male with medical history significant of Guillain-Barr syndrome with variant, acute inflammatory demyelinating polyneuropathy, hypertension, iron deficiency, HLD, COPD, history of tobacco abuse    Presented with   progressive tingling of lower and upper extremities Patient has episode of tingling numbness of the legs arms and hands that has been progressive since mid November she said at that time she was diagnosed with Guillain-Barr syndrome.  This is may be her third episode of the same.  She had had IVIG in the past as well as plasma exchange with some improvement. IVIG was trying to be administered as an outpatient but the office was closed so the plan was for patient to present to emergency department and be admitted Her symptoms has progressed last night she developed more numbness and tingling that is going up her hands and feet.  Also some trouble ambulation secondary to this.  No trouble breathing no chest pain or shortness of breath no nausea no vomiting no tingling in her face no trouble swallowing or voice changes.   Quit smoking in November Drinks on occasion   Initial COVID TEST  NEGATIVE  Lab Results  Component Value Date   [place] NEGATIVE 11/15/2022   [place] NEGATIVE 09/17/2021     Regarding pertinent Chronic problems:    Hyperlipidemia -  not on  statins   Lipid Panel     Component Value Date/Time   CHOL 200 09/25/2022 0522   TRIG 499 (H) 09/25/2022 0522   HDL 29 (L)  09/25/2022 0522   CHOLHDL 6.9 09/25/2022 0522   VLDL UNABLE TO CALCULATE IF TRIGLYCERIDE OVER 400 mg/dL 09/25/2022 0522   LDLCALC UNABLE TO CALCULATE IF TRIGLYCERIDE OVER 400 mg/dL 09/25/2022 0522   LDLDIRECT 103 (H) 09/25/2022 0522     HTN  not on meds    CAD  - On Aspirin,     Pre DM 2 -  Lab Results  Component Value Date   HGBA1C 6.4 (H) 11/03/2022   diet controlled   COPD - not followed by pulmonology not  on baseline oxygen   ,       While in ER:  Vital stable glucose 170 noted to have slight increase in eosinophils count of 0.7 unclear etiology Seen by neurology who thinks this is a recurrence of demyelinating polyneuropathy recommend medicine to admit for either IVIG or plasmapheresis     Following Medications were ordered in ER: Medications - No data to display  _______________________________________________________ ER Provider Called:   Neurology   Dr.Bahgat They Recommend admit to medicine    SEEN in ER   ED Triage Vitals  Enc Vitals Group     BP 11/15/22 0728 (!) 140/84     Pulse Rate 11/15/22 0728 74     Resp 11/15/22 0728 18     Temp 11/15/22 0728 98.2 F (36.8 C)     Temp  Source 11/15/22 0728 Oral     SpO2 11/15/22 0728 93 %     Weight 11/15/22 0735 195 lb (88.5 kg)     Height 11/15/22 0735 _0  (1.778 m)     Head Circumference --      Peak Flow --      Pain Score 11/15/22 0735 5     Pain Loc --      Pain Edu? --      Excl. in [place]? --   TMAX(24)@     _________________________________________ Significant initial  Findings: Abnormal Labs Reviewed  CBC WITH DIFFERENTIAL/PLATELET - Abnormal; Notable for the following components:      Result Value   Eosinophils Absolute 0.7 (*)    All other components within normal limits  COMPREHENSIVE METABOLIC PANEL - Abnormal; Notable for the following components:   CO2 21 (*)    Glucose, Bld 117 (*)    Alkaline Phosphatase 37 (*)    All other components within normal limits    ECG: Ordered Personally  reviewed and interpreted by me showing: HR : 68 Rhythm: Normal sinus rhythm Normal ECG QTC 412   The recent clinical data is shown below. Vitals:   11/15/22 1630 11/15/22 1730 11/15/22 1800 11/15/22 1819  BP: 136/86 117/61 106/66   Pulse: 82 66 (!) 59   Resp: (!) _1 Temp:    97.9 F (36.6 C)  TempSrc:    Oral  SpO2: 96% 95% 96%   Weight:      Height:         WBC     Component Value Date/Time   WBC 7.0 11/15/2022 0751   LYMPHSABS 2.0 11/15/2022 0751   MONOABS 0.7 11/15/2022 0751   EOSABS 0.7 (H) 11/15/2022 0751   BASOSABS 0.1 11/15/2022 0751      UA   no evidence of UTI     Urine analysis:    Component Value Date/Time   COLORURINE YELLOW 11/15/2022 0910   APPEARANCEUR CLEAR 11/15/2022 0910   LABSPEC 1.016 11/15/2022 0910   PHURINE 5.0 11/15/2022 0910   GLUCOSEU NEGATIVE 11/15/2022 0910   GLUCOSEU NEGATIVE 09/02/2020 1112   HGBUR NEGATIVE 11/15/2022 0910   BILIRUBINUR NEGATIVE 11/15/2022 0910   KETONESUR NEGATIVE 11/15/2022 0910   PROTEINUR NEGATIVE 11/15/2022 0910   UROBILINOGEN 0.2 09/02/2020 1112   NITRITE NEGATIVE 11/15/2022 0910   LEUKOCYTESUR NEGATIVE 11/15/2022 0910    Results for orders placed or performed during the hospital encounter of 11/15/22  Resp panel by RT-PCR (RSV, Flu A&B, Covid) Anterior Nasal Swab     Status: None   Collection Time: 11/15/22  9:10 AM   Specimen: Anterior Nasal Swab  Result Value Ref Range Status   SARS Coronavirus 2 by RT PCR NEGATIVE NEGATIVE Final         Influenza A by PCR NEGATIVE NEGATIVE Final   Influenza B by PCR NEGATIVE NEGATIVE Final         Resp Syncytial Virus by PCR NEGATIVE NEGATIVE Final          _______________________________________________ Hospitalist was called for admission for   Acute inflammatory demyelinating polyneuropathy     The following Work up has been ordered so far:  Orders Placed This Encounter  Procedures   Resp panel by RT-PCR (RSV, Flu A&B, Covid) Anterior Nasal Swab    CBC with Differential   Comprehensive metabolic panel   Urinalysis, Routine w reflex microscopic   Miscellaneous [facility] test (send-out)   Miscellaneous [facility]  test (send-out)   Multiple Myeloma Panel (SPEP&IFE w/QIG)   CK   Magnesium   Phosphorus   Prealbumin   TSH   Urinalysis, Complete w Microscopic   Consult to neurology   Consult to hospitalist   Airborne and Contact precautions   NIF     OTHER Significant initial  Findings:  labs showing:  Recent Labs  Lab 11/15/22 0751  NA 136  K 4.2  CO2 21*  GLUCOSE 117*  BUN 18  CREATININE 0.88  CALCIUM 9.3    Cr  stable,   Lab Results  Component Value Date   CREATININE 0.88 11/15/2022   CREATININE 0.83 11/06/2022   CREATININE 0.93 11/03/2022    Recent Labs  Lab 11/15/22 0751  AST 20  ALT 27  ALKPHOS 37*  BILITOT 0.5  PROT 6.7  ALBUMIN 4.1   Lab Results  Component Value Date   CALCIUM 9.3 11/15/2022    Plt: Lab Results  Component Value Date   PLT 283 11/15/2022    COVID-19 Labs  No results for input(s): "DDIMER", "FERRITIN", "LDH", "CRP" in the last 72 hours.  Lab Results  Component Value Date   SARSCOV2NAA NEGATIVE 11/15/2022   [place] NEGATIVE 09/17/2021     Recent Labs  Lab 11/15/22 0751  WBC 7.0  NEUTROABS 3.6  HGB 14.5  HCT 43.3  MCV 98.9  PLT 283    HG/HCT  stable,      Component Value Date/Time   HGB 14.5 11/15/2022 0751   HCT 43.3 11/15/2022 0751   MCV 98.9 11/15/2022 0751    DM  labs:  HbA1C: Recent Labs    09/24/22 0840 11/03/22 0600  HGBA1C 6.5 6.4*    CBG (last 3)  No results for input(s): "GLUCAP" in the last 72 hours.    Cultures:    Component Value Date/Time   SDES CSF 09/25/2022 1254   SPECREQUEST NONE 09/25/2022 1254   CULT  09/17/2021 2313    NO GROWTH 5 DAYS Performed at [facility], [place] [address] [place], [place] [address]    REPTSTATUS 09/25/2022 FINAL 09/25/2022 1254     Radiological Exams on Admission: No results  found. _______________________________________________________________________________________________________ Latest  Blood pressure 106/66, pulse (!) 59, temperature 97.9 F (36.6 C), temperature source Oral, resp. rate 17, height _0  (1.778 m), weight 88.5 kg, SpO2 96 %.   Vitals  labs and radiology finding personally reviewed  Review of Systems:    Pertinent positives include:  fatigue,   Constitutional:  No weight loss, night sweats, Fevers, chills, weight loss  HEENT:  No headaches, Difficulty swallowing,Tooth/dental problems,Sore throat,  No sneezing, itching, ear ache, nasal congestion, post nasal drip,  Cardio-vascular:  No chest pain, Orthopnea, PND, anasarca, dizziness, palpitations.no Bilateral lower extremity swelling  GI:  No heartburn, indigestion, abdominal pain, nausea, vomiting, diarrhea, change in bowel habits, loss of appetite, melena, blood in stool, hematemesis Resp:  no shortness of breath at rest. No dyspnea on exertion, No excess mucus, no productive cough, No non-productive cough, No coughing up of blood.No change in color of mucus.No wheezing. Skin:  no rash or lesions. No jaundice GU:  no dysuria, change in color of urine, no urgency or frequency. No straining to urinate.  No flank pain.  Musculoskeletal:  No joint pain or no joint swelling. No decreased range of motion. No back pain.  Psych:  No change in mood or affect. No depression or anxiety. No memory loss.  Neuro: no localizing neurological complaints,  no tingling, no weakness, no double vision, no gait abnormality, no slurred speech, no confusion  All systems reviewed and apart from [place] all are negative _______________________________________________________________________________________________ Past Medical History:   Past Medical History:  Diagnosis Date   AAA (abdominal aortic aneurysm) ([place])    Cataract    forming       Past Surgical History:  Procedure Laterality Date   FOOT  SURGERY Right    HERNIA REPAIR Right    IR FLUORO GUIDE CV LINE RIGHT  10/25/2022   IR US GUIDE VASC ACCESS RIGHT  10/25/2022   KNEE ARTHROSCOPY Left    WISDOM TOOTH EXTRACTION      Social History:  Ambulatory   independently        reports that he quit smoking about 8 weeks ago. His smoking use included cigarettes. He has a 100.00 pack-year smoking history. He has never used smokeless tobacco. He reports current alcohol use of about 3.0 standard drinks of alcohol per week. He reports that he does not use drugs.     Family History:   Family History  Problem Relation Age of Onset   Stroke Mother 68   AAA (abdominal aortic aneurysm) Mother    Cancer Father        Prostate   Colon cancer Neg Hx    Colon polyps Neg Hx    Esophageal cancer Neg Hx    Rectal cancer Neg Hx    Stomach cancer Neg Hx    ______________________________________________________________________________________________ Allergies: No Known Allergies   Prior to Admission medications   Medication Sig Start Date End Date Taking? Authorizing Provider  aspirin EC 81 MG tablet Take 81 mg by mouth in the morning.   Yes [provider]  Cyanocobalamin (B-12 PO) Take 1 tablet by mouth daily with lunch.   Yes [provider]  DOCUSATE SODIUM PO Take 1 capsule by mouth daily as needed (constipation).   Yes [provider]  ferrous sulfate 325 (65 FE) MG EC tablet Take 325 mg by mouth daily with lunch.   Yes [provider]  Fluocinonide (LIDEX EX) Apply 1 application  topically daily as needed (rash).   Yes [provider]  gabapentin (NEURONTIN) 400 MG capsule Take 1 capsule (400 mg total) by mouth 3 (three) times daily. 11/08/22  Yes Angiulli, Lavon Paganini, PA-C  ibuprofen (ADVIL) 200 MG tablet Take 400 mg by mouth daily as needed for headache or mild pain.   Yes [provider]  Multiple Vitamins-Minerals (CENTRUM SILVER) tablet Take 1 tablet by mouth daily with  lunch.   Yes [provider]  nicotine (NICODERM CQ - DOSED IN MG/24 HOURS) 21 mg/24hr patch Place 21 mg onto the skin daily as needed (nicotine craving).   Yes [provider]  Omega-3 Fatty Acids (FISH OIL PO) Take 1 capsule by mouth daily with lunch. When able to remember   Yes [provider]  sildenafil (VIAGRA) 100 MG tablet Take 100 mg by mouth daily as needed for erectile dysfunction.   Yes [provider]  acetaminophen (TYLENOL) 325 MG tablet Take 1-2 tablets (325-650 mg total) by mouth every 4 (four) hours as needed for mild pain. Patient not taking: Reported on 11/15/2022 11/06/22   Love, Ivan Anchors, PA-C  nicotine (NICODERM CQ - DOSED IN MG/24 HOURS) 14 mg/24hr patch Place 1 patch (14 mg total) onto the skin daily. Patient not taking: Reported on 11/15/2022 11/08/22   Cathlyn Parsons, PA-C    ___________________________________________________________________________________________________  Physical Exam:    11/15/2022    6:00 PM 11/15/2022    5:30 PM 11/15/2022    4:30 PM  Vitals with BMI  Systolic 970 263 785  Diastolic 66 61 86  Pulse 59 66 82     1. General:  in No  Acute distress    Chronically ill   -appearing 2. Psychological: Alert and   Oriented 3. Head/ENT:   Dry Mucous Membranes                          Head Non traumatic, neck supple                         Poor Dentition 4. SKIN:  decreased Skin turgor,  Skin clean Dry and intact no rash 5. Heart: Regular rate and rhythm no  Murmur, no Rub or gallop 6. Lungs:   no wheezes or crackles   7. Abdomen: Soft,  non-tender,   distended  bowel sounds present 8. Lower extremities: no clubbing, cyanosis, no  edema 9. Neurologically strength 5 out of 5 in all 4 extremities cranial nerves II through XII intact, decreased sensation on both hands and feet 10. MSK: Normal range of motion    Chart has been  reviewed  ______________________________________________________________________________________________  Assessment/Plan 72 y.o. male with medical history significant of Guillain-Barr syndrome with variant, acute inflammatory demyelinating polyneuropathy, hypertension, iron deficiency, HLD, COPD, history of tobacco abuse  Admitted for   Acute inflammatory demyelinating polyneuropathy     Present on Admission:  Guillain Barr syndrome ([place])  Acute inflammatory demyelinating polyneuropathy ([place])  Essential hypertension  Pre-diabetes  Constipation     Acute inflammatory demyelinating polyneuropathy ([place]) Appreciate neurology consult.  May need IVIG versus plasmapheresis defer to neurology regarding this.  Continue Neurontin at 400 mg 3 times a day  Essential hypertension Continue to monitor permissive hypertension for tonight  Guillain Barr syndrome [facility]) See above defer to neurology for plan of care  Pre-diabetes diet controlled at this time check hemoglobin A1c and TSH  Constipation Obtain KUB Ordered bowel regimen    Other plan as per orders.  DVT prophylaxis:  SCD     Code Status:    Code Status: Prior FULL CODE as per patient   I had personally discussed CODE STATUS with patient and family    Family Communication:   Family  at  Bedside  plan of care was discussed  with   Wife,   Disposition Plan:           To home once workup is complete and patient is stable   Following barriers for discharge:                                                  Will need consultants to evaluate patient prior to discharge                     Would benefit from PT/OT eval prior to DC  Ordered                    Consults called: Neurology is aware  Admission status:  ED Disposition     ED Disposition  [facility]: [facility]  [place] [[id number]]  Level of Care: Med-Surg [16]  May admit patient to [facility] or [facility] if  equivalent level of care is available:: No  Covid Evaluation: Confirmed COVID Negative  Diagnosis: Peggye Ley [facility]) [[id number]]  Admitting Physician: Toy Baker [[id number]]  Attending Physician: Toy Baker [[id number]]  Certification:: I certify this patient will need inpatient services for at least 2 midnights  Estimated Length of Stay: 2             inpatient     I Expect 2 midnight stay secondary to severity of patient's current illness need for inpatient interventions justified by the following:    Severe lab/radiological/exam abnormalities including:   Worsening inflammatory demyelination and extensive comorbidities including: Guillain-Barr syndrome  That are currently affecting medical management.   I expect  patient to be hospitalized for 2 midnights requiring inpatient medical care.  Patient is at high risk for adverse outcome (such as loss of life or disability) if not treated.  Indication for inpatient stay as follows:    Need for operative/procedural  intervention    Need for  IV fluids, need for IV IG vs Plasmapheresis    Level of care       medical floor       Lab Results  Component Value Date   [place] 11/15/2022     Precautions: admitted as   Covid Negative     Brianni Manthe 11/15/2022, 8:19 PM    [facility]     after 2 AM please page floor coverage PA If 7AM-7PM, please contact the day team taking care of the patient using [email or web address]   Patient was evaluated in the context of the global COVID-19 pandemic, which necessitated consideration that the patient might be at risk for infection with the SARS-CoV-2 virus that causes COVID-19. Institutional protocols and algorithms that pertain to the evaluation of patients at risk for COVID-19 are in a state of rapid change based on information released by regulatory bodies including the CDC and federal and state organizations. These policies and algorithms were  followed during the patient's care.

## 2022-11-15 NOTE — Telephone Encounter (Signed)
I spoke with Dr. Curly Shores via the secure chat and she stated that she was wanting to administer IVIG today, but was trying to do it in an outpatient setting to avoid admitting him to the hospital, but was informed we are closed Friday-Sunday and per the order in our office, the IVIG was to be administered over four consecutive days. After relaying this information, she stated she will probably admit him following a more through exam of him in the ER.

## 2022-11-15 NOTE — ED Provider Notes (Signed)
[facility] EMERGENCY DEPARTMENT Provider Note  CSN: [id number] Arrival date & time: 11/15/22 0720  Chief Complaint(s) Tingling and Numbness  HPI Joe Greene is a 72 y.o. male with history of acute inflammatory demyelinating polyneuropathy presenting with numbness and tingling.  He reports that last night he developed numbness and tingling in his hands and his feet.  He reports associated weakness and trouble ambulating.  He reports last night he had trouble turning the pages of a book.  He reports that it is somewhat better this morning.  He denies any trouble breathing, chest pain, abdominal pain, numbness or tingling in his face, trouble swallowing, voice changes.  He was previously admitted for this and treated with IVIG and plasmapheresis with some improvement in his symptoms.   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[place]) 10/18/2022   B12 deficiency 10/18/2022   Gait abnormality 10/18/2022   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 10/10/2022   Pre-diabetes 09/25/2022   AAA (abdominal aortic aneurysm) ([place]) 09/24/2022   Essential hypertension 09/24/2022   Iron deficiency 04/04/2022   Hypertriglyceridemia 04/03/2022   Coronary artery calcification 01/18/2022   Candidal intertrigo    Emphysema of lung ([place]) 07/26/2021   Aortic atherosclerosis ([place]) 07/26/2021   Internal hemorrhoids 01/23/2021   Diverticula of colon 01/23/2021   Eczema 01/23/2021   Actinic keratoses 01/23/2021   Tobacco use 09/02/2020   Home Medication(s) Prior to Admission medications   Medication Sig Start Date End Date  Taking? Authorizing Provider  acetaminophen (TYLENOL) 325 MG tablet Take 1-2 tablets (325-650 mg total) by mouth every 4 (four) hours as needed for mild pain. 11/06/22   Love, Ivan Anchors, PA-C  aspirin EC 81 MG tablet Take 81 mg by mouth daily. Swallow whole.    [provider]  ferrous sulfate 325 (65 FE) MG EC tablet Take 325 mg by mouth daily. When able to remember    [provider]  gabapentin (NEURONTIN) 400 MG capsule Take 1 capsule (400 mg total) by mouth 3 (three) times daily. 11/08/22   Angiulli, Lavon Paganini, PA-C  Multiple Vitamin (MULTIVITAMIN WITH MINERALS) TABS tablet Take 1 tablet by mouth daily. 10/02/22   Little Ishikawa, MD  nicotine (NICODERM CQ - DOSED IN MG/24 HOURS) 14 mg/24hr patch Place 1 patch (14 mg total) onto the skin daily. 11/08/22   Angiulli, Lavon Paganini, PA-C  Omega-3 Fatty Acids (FISH OIL PO) Take 1 capsule by mouth daily. When able to remember    [provider]  Past Surgical History Past Surgical History:  Procedure Laterality Date   FOOT SURGERY Right    HERNIA REPAIR Right    IR FLUORO GUIDE CV LINE RIGHT  10/25/2022   IR US GUIDE VASC ACCESS RIGHT  10/25/2022   KNEE ARTHROSCOPY Left    WISDOM TOOTH EXTRACTION     Family History Family History  Problem Relation Age of Onset   Stroke Mother 60   AAA (abdominal aortic aneurysm) Mother    Cancer Father        Prostate   Colon cancer Neg Hx    Colon polyps Neg Hx    Esophageal cancer Neg Hx    Rectal cancer Neg Hx    Stomach cancer Neg Hx     Social History Social History   Tobacco Use   Smoking status: Former    Packs/day: 2.00    Years: 50.00    Total pack years: 100.00    Types: Cigarettes    Quit date: 09/18/2022    Years since quitting: 0.1   Smokeless tobacco: Never  Vaping Use   Vaping Use: Never used  Substance Use Topics    Alcohol use: Yes    Alcohol/week: 3.0 standard drinks of alcohol    Types: 3 Standard drinks or equivalent per week    Comment: 2 or 3 drinks per week   Drug use: Never   Allergies Patient has no known allergies.  Review of Systems Review of Systems  All other systems reviewed and are negative.   Physical Exam Vital Signs  I have reviewed the triage vital signs BP 128/75   Pulse 71   Temp 97.6 F (36.4 C) (Oral)   Resp 16   Ht 5\' 10"  (1.778 m)   Wt 88.5 kg   SpO2 98%   BMI 27.98 kg/m  Physical Exam Vitals and nursing note reviewed.  Constitutional:      General: He is not in acute distress.    Appearance: Normal appearance.  HENT:     Mouth/Throat:     Mouth: Mucous membranes are moist.  Eyes:     Conjunctiva/sclera: Conjunctivae normal.  Cardiovascular:     Rate and Rhythm: Normal rate and regular rhythm.  Pulmonary:     Effort: Pulmonary effort is normal. No respiratory distress.     Breath sounds: Normal breath sounds.  Abdominal:     General: Abdomen is flat.     Palpations: Abdomen is soft.     Tenderness: There is no abdominal tenderness.  Musculoskeletal:     Right lower leg: No edema.     Left lower leg: No edema.  Skin:    General: Skin is warm and dry.     Capillary Refill: Capillary refill takes less than 2 seconds.  Neurological:     Mental Status: He is alert and oriented to person, place, and time. Mental status is at baseline.     Comments: Cranial nerves II through XII intact.  Strength 5 out of 5 in the bilateral upper and lower extremities.  Decreased sensation to light touch in the hands and the feet in a stocking glove distribution  Psychiatric:        Mood and Affect: Mood normal.        Behavior: Behavior normal.     ED Results and Treatments Labs (all labs ordered are listed, but only abnormal results are displayed) Labs Reviewed  CBC WITH DIFFERENTIAL/PLATELET - Abnormal; Notable for the following components:  Result Value    Eosinophils Absolute 0.7 (*)    All other components within normal limits  COMPREHENSIVE METABOLIC PANEL - Abnormal; Notable for the following components:   CO2 21 (*)    Glucose, Bld 117 (*)    Alkaline Phosphatase 37 (*)    All other components within normal limits  RESP PANEL BY RT-PCR (RSV, FLU A&B, COVID)  RVPGX2  URINALYSIS, ROUTINE W REFLEX MICROSCOPIC                                                                                                                          Radiology No results found.  Pertinent labs & imaging results that were available during my care of the patient were reviewed by me and considered in my medical decision making (see MDM for details).  Medications Ordered in ED Medications - No data to display                                                                                                                                   Procedures Procedures  (including critical care time)  Medical Decision Making / ED Course   MDM:  72 year old male presenting to the emergency department numbness and tingling in the hands and feet.  Symptoms concerning for recurrence of his demyelinating polyneuropathy.  His laboratory testing is overall reassuring.  He is not in respiratory distress.  Patient will need to be evaluated by neurology to determine whether he would benefit from admission for IVIG although given his worsening symptoms suspect he will end up needing to come into the hospital.  Signed out to the oncoming provider pending neurology consultation and recommendations.      Additional history obtained: -Additional history obtained from family -External records from outside source obtained and reviewed including: Chart review including previous notes, labs, imaging, consultation notes including admission 11/02/22   Lab Tests: -I ordered, reviewed, and interpreted labs.   The pertinent results include:   Labs Reviewed  CBC WITH  DIFFERENTIAL/PLATELET - Abnormal; Notable for the following components:      Result Value   Eosinophils Absolute 0.7 (*)    All other components within normal limits  COMPREHENSIVE METABOLIC PANEL - Abnormal; Notable for the following components:   CO2 21 (*)    Glucose, Bld 117 (*)    Alkaline Phosphatase 37 (*)    All other components within normal limits  RESP PANEL BY RT-PCR (RSV, FLU A&B, COVID)  RVPGX2  URINALYSIS, ROUTINE W REFLEX MICROSCOPIC    Notable for no AKI, leukocytosis  Medicines ordered and prescription drug management: No orders of the defined types were placed in this encounter.   -I have reviewed the patients home medicines and have made adjustments as needed   Social Determinants of Health:  Diagnosis or treatment significantly limited by social determinants of health: former smoker   Reevaluation: After the interventions noted above, I reevaluated the patient and found that they have stayed the same  Co morbidities that complicate the patient evaluation  Past Medical History:  Diagnosis Date   AAA (abdominal aortic aneurysm) ([place])    Cataract    forming       Dispostion: Disposition decision including need for hospitalization was considered, and patient disposition pending at sign out    Final Clinical Impression(s) / ED Diagnoses Final diagnoses:  Acute inflammatory demyelinating polyneuropathy ([place])     This chart was dictated using voice recognition software.  Despite best efforts to proofread,  errors can occur which can change the documentation meaning.    Cristie Hem, MD 11/15/22 [phone]

## 2022-11-15 NOTE — Assessment & Plan Note (Signed)
Appreciate neurology consult.  May need IVIG versus plasmapheresis defer to neurology regarding this.  Continue Neurontin at 400 mg 3 times a day

## 2022-11-15 NOTE — Consult Note (Signed)
Neurology Consultation    Reason for Consult: AIDP  CC: Weakness   HISTORY OF PRESENT ILLNESS   HPI  History is obtained from: Patient, wife   Joe Greene is a 72 y.o. 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 He completed IVIG 11/14-11/18 and PLEX from 12/15-12/22. Following his PLEX in December he was discharged to [facility] where he continued to have an improvement in his strength and walking. He was discharged from [facility] on 11/10/2022. He noticed increasing numbness in his feet and hands starting Tuesday and it has progressed to his elbows and knees as of today, he has also had increasing difficulty ambulating today despite using his walker.  He isn't able to tell me which therapy (IVIG vs PLEX) worked best for him, he calls it a "coin toss" to have to pick. He did get much better with IVIG the first time, but he states that he also feels like he went downhill fast after he was discharged prior to his second flare. He does not have a strong opinion one way or another on which therapy he receives while in the hospital, he just wants to keep it from progressing the was it has in the past.   He is following with Dr. Krista Blue at [facility] and did receive authorization to start outpatient IVIG, with plan to start on 1/10 with a 2g/kg load divided over 4 days and then 1g/kg every 3 weeks.    The patient denies headache, dizziness, visual changes, problems with swallowing or speech, focal muscle weakness, abnormal movements, or other focal neurological deficits.  ROS: Full ROS was performed and is negative except as noted in the HPI.   PAST MEDICAL HISTORY    Past Medical History:  Past Medical History:  Diagnosis Date   AAA (abdominal aortic aneurysm) ([place])    Cataract    forming     No family history on file. Family History  Problem Relation  Age of Onset   Stroke Mother 59   AAA (abdominal aortic aneurysm) Mother    Cancer Father        Prostate   Colon cancer Neg Hx    Colon polyps Neg Hx    Esophageal cancer Neg Hx    Rectal cancer Neg Hx    Stomach cancer Neg Hx     Allergies:  No Known Allergies  Social History:   reports that he quit smoking about 8 weeks ago. His smoking use included cigarettes. He has a 100.00 pack-year smoking history. He has never used smokeless tobacco. He reports current alcohol use of about 3.0 standard drinks of alcohol per week. He reports that he does not use drugs.    Medications (Not in a hospital admission)   EXAMINATION    Current vital signs:    11/15/2022    3:00 PM 11/15/2022    1:24 PM 11/15/2022   10:19 AM  Vitals with BMI  Systolic 163 846 659  Diastolic 83 82 77  Pulse 77 70 70    Examination:  GENERAL: Awake, alert in NAD HEENT: - Normocephalic and atraumatic, dry mm, no lymphadenopathy, no Thyromegally LUNGS - Clear to auscultation bilaterally CV - S1S2 RRR, equal pulses bilaterally. ABDOMEN - Soft, nontender, nondistended with normoactive BS Ext: warm, well perfused, intact peripheral pulses, no pedal edema  NEURO:  Mental Status: AA&Ox3  Language: speech is clear.  Intact naming, repetition, fluency, and comprehension. Cranial Nerves:  II: PERRL. Visual fields full  III, IV, VI: EOM intact. Eyelids elevate symmetrically. Blinks to threat.  V: Sensation intact V1-3 symmetrically  VII: no facial asymmetry   VIII: hearing intact to voice IX, X: Palate elevates symmetrically. Phonation is normal.  VE:HMCNOBSJ shrug 5/5 and symmetrical  XII: tongue is midline without fasciculations. Motor:  RUE:5/5     LUE: 5/5  RLE: 4/5     LLE: 4/5      Attending eval 4/5 hip flexion bilaterally, 5/5 knee extension, 4+/5 knee flexion, 4+/5 foot dorsiflexion, 5/5 plantarflexion DTRs: absent patellars, brachioradialis, biceps Sensation-  Sensation to light touch is  diminished at the knees through the toes bilaterally and from the elbows to the toes. Is able to feel pressure Sensation is absent in the fingers  Tingling sensation with pressure in bilateral forearms  Coordination: FTN intact bilaterally Gait- deferred- currently using a walker    LABS   I have reviewed labs in epic and the results pertinent to this consultation are:   Basic Metabolic Panel: Recent Labs  Lab 11/15/22 0751  NA 136  K 4.2  CL 102  CO2 21*  GLUCOSE 117*  BUN 18  CREATININE 0.88  CALCIUM 9.3    CBC: Recent Labs  Lab 11/15/22 0751  WBC 7.0  NEUTROABS 3.6  HGB 14.5  HCT 43.3  MCV 98.9  PLT 283    Coagulation Studies: No results for input(s): "LABPROT", "INR" in the last 72 hours.    Lab Results  Component Value Date   LDLCALC UNABLE TO CALCULATE IF TRIGLYCERIDE OVER 400 mg/dL 09/25/2022   Lab Results  Component Value Date   ALT 27 11/15/2022   AST 20 11/15/2022   ALKPHOS 37 (L) 11/15/2022   BILITOT 0.5 11/15/2022   Lab Results  Component Value Date   HGBA1C 6.4 (H) 11/03/2022   Lab Results  Component Value Date   WBC 7.0 11/15/2022   HGB 14.5 11/15/2022   HCT 43.3 11/15/2022   MCV 98.9 11/15/2022   PLT 283 11/15/2022   Lab Results  Component Value Date   VITAMINB12 506 10/18/2022   Lab Results  Component Value Date   FOLATE >20.0 10/18/2022   Lab Results  Component Value Date   NA 136 11/15/2022   K 4.2 11/15/2022   CL 102 11/15/2022   CO2 21 (L) 11/15/2022     DIAGNOSTIC IMAGING/PROCEDURES   I have reviewed the images obtained:, as below   MRI/MRA 11/14 1. No acute brain finding. Mild chronic small-vessel ischemic change of the pons and cerebral hemispheric white matter. 2. Negative intracranial MR angiography of the large and medium size vessels. 3. Left mastoid effusion.  ASSESSMENT/PLAN    Assessment:   72 y.o. male who was previously diagnosed with AIDP confirmed with EMG in November presenting with  ascending numbness and tingling in his extremities. He completed IVIG 11/14-11/18 and PLEX from 12/15-12/22. But unfortunately is having progressive symptoms again. Cannot obtain IVIG outpatient as no weekend option available for infusion, so will admit to prevent further progression.   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 MD to update note as needed.   Elmer Picker, DNP, FNP-BC [facility] Pager: [phone]   **This documentation was dictated using Dragon Medical Software and may contain inadvertent errors **  Attending Neurologist's note:  I personally saw this patient, gathering history, performing a full neurologic examination, reviewing relevant labs, personally reviewing relevant imaging including MRI brain, and formulated the assessment and plan, adding the note above for completeness and clarity to accurately reflect my thoughts

## 2022-11-15 NOTE — ED Provider Notes (Signed)
Neurology consult for dispo and management for demyelinating disease. Physical Exam  BP 122/83   Pulse 77   Temp 97.6 F (36.4 C) (Oral)   Resp 19   Ht 5\' 10"  (1.778 m)   Wt 88.5 kg   SpO2 96%   BMI 27.98 kg/m   Physical Exam  Procedures  Procedures  ED Course / MDM    Medical Decision Making Amount and/or Complexity of Data Reviewed Labs: ordered.  Risk Decision regarding hospitalization.   Neurology recommendations for admission. Consult: Reviewed with Dr. Darnell Level for admission.        Charlesetta Shanks, MD 11/15/22 [phone]

## 2022-11-15 NOTE — Assessment & Plan Note (Signed)
Obtain KUB Ordered bowel regimen

## 2022-11-15 NOTE — Assessment & Plan Note (Signed)
See above defer to neurology for plan of care

## 2022-11-15 NOTE — ED Triage Notes (Addendum)
Pt. Stated, Im having an episode of tingling, numbness in legs , arms and hands . 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  1st time I got the IV  IG , 2nd time was Plasma exchange.

## 2022-11-15 NOTE — Assessment & Plan Note (Signed)
diet controlled at this time check hemoglobin A1c and TSH

## 2022-11-15 NOTE — Telephone Encounter (Signed)
[place] (Dr., Charlies Constable)  IVIG has been approved for your office. Want to know if your office can arrange as a outpatient. Would like a call from the nurse.

## 2022-11-15 NOTE — ED Provider Triage Note (Addendum)
Emergency Medicine Provider Triage Evaluation Note  Joe Greene , a 72 y.o. male  was evaluated in triage.  Pt complains of increased tingling and numbness to his hands and feet for the past 2 days.  He is concern for worsening of his Gamber a syndrome.  He was treated with IVIG in December with improvement in his sensory deficits but feels like they are getting worse again.  increasing weakness.  No fever..  Review of Systems  Positive: Tingling, numbness, weakness Negative: Feve  Physical Exam  BP (!) 140/84 (BP Location: Left Arm)   Pulse 74   Temp 98.2 F (36.8 C) (Oral)   Resp 18   Ht 5\' 10"  (1.778 m)   Wt 88.5 kg   SpO2 93%   BMI 27.98 kg/m  Gen:   Awake, no distress   Resp:  Normal effort  MSK:   Moves extremities without difficulty  Other:  Subjective tingling to hands and feet without gross weakness.  Medical Decision Making  Medically screening exam initiated at 8:30 AM.  Appropriate orders placed.  Dianna Deshler was informed that the remainder of the evaluation will be completed by another provider, this initial triage assessment does not replace that evaluation, and the importance of remaining in the ED until their evaluation is complete.  D/w Dr. Curly Shores of neurology who will review chart and give recommendations  Dr. Curly Shores states patient can likely be managed as an outpatient.  She recommends ruling out infectious pathology and she is waiting to hear back from patient's outpatient neurologist regarding outpatient treatment     Ezequiel Essex, MD 11/15/22 1660    Ezequiel Essex, MD 11/15/22 [phone]

## 2022-11-15 NOTE — Subjective & Objective (Signed)
Patient has episode of tingling numbness of the legs arms and hands that has been progressive since mid November she said at that time she was diagnosed with Guillain-Barr syndrome.  This is may be her third episode of the same.  She had had IVIG in the past as well as plasma exchange with some improvement. IVIG was trying to be administered as an outpatient but the office was closed so the plan was for patient to present to emergency department and be admitted Her symptoms has progressed last night she developed more numbness and tingling that is going up her hands and feet.  Also some trouble ambulation secondary to this.  No trouble breathing no chest pain or shortness of breath no nausea no vomiting no tingling in her face no trouble swallowing or voice changes.

## 2022-11-15 NOTE — Telephone Encounter (Signed)
Sent a message to Dr. Curly Shores via the secure chat

## 2022-11-15 NOTE — Telephone Encounter (Signed)
Pt has been to the emergency room for hours today. He has been previously during a flare up and gotten IVIG treatment and plasma exchange. pt is wonder if we know anything about getting these treatments done somewhere else besides the emergency room. [phone]

## 2022-11-15 NOTE — Assessment & Plan Note (Signed)
Continue to monitor permissive hypertension for tonight

## 2022-11-15 NOTE — Telephone Encounter (Signed)
Please view previous telephone note if patient calls back, we have already been in contact with hospital regarding this issue and Dr. Rhea Belton recommendation. Thank you!

## 2022-11-15 NOTE — Progress Notes (Signed)
Pt performed NIF with great effort.   NIF -40

## 2022-11-16 ENCOUNTER — Ambulatory Visit: Payer: Medicare PPO | Admitting: Physical Therapy

## 2022-11-16 ENCOUNTER — Encounter (HOSPITAL_COMMUNITY): Payer: Self-pay | Admitting: Internal Medicine

## 2022-11-16 DIAGNOSIS — I10 Essential (primary) hypertension: Secondary | ICD-10-CM | POA: Diagnosis not present

## 2022-11-16 DIAGNOSIS — K5900 Constipation, unspecified: Secondary | ICD-10-CM | POA: Diagnosis not present

## 2022-11-16 DIAGNOSIS — R7303 Prediabetes: Secondary | ICD-10-CM

## 2022-11-16 DIAGNOSIS — G610 Guillain-Barre syndrome: Principal | ICD-10-CM

## 2022-11-16 LAB — COMPREHENSIVE METABOLIC PANEL
ALT: 24 U/L (ref 0–44)
AST: 18 U/L (ref 15–41)
Albumin: 3.4 g/dL — ABNORMAL LOW (ref 3.5–5.0)
Alkaline Phosphatase: 32 U/L — ABNORMAL LOW (ref 38–126)
Anion gap: 8 (ref 5–15)
BUN: 19 mg/dL (ref 8–23)
CO2: 21 mmol/L — ABNORMAL LOW (ref 22–32)
Calcium: 8.3 mg/dL — ABNORMAL LOW (ref 8.9–10.3)
Chloride: 108 mmol/L (ref 98–111)
Creatinine, Ser: 0.93 mg/dL (ref 0.61–1.24)
GFR, Estimated: >60 mL/min (ref >60)
Glucose, Bld: 115 mg/dL — ABNORMAL HIGH (ref 70–99)
Potassium: 3.8 mmol/L (ref 3.5–5.1)
Sodium: 137 mmol/L (ref 135–145)
Total Bilirubin: 0.8 mg/dL (ref 0.3–1.2)
Total Protein: 6.1 g/dL — ABNORMAL LOW (ref 6.5–8.1)

## 2022-11-16 LAB — PHOSPHORUS: Phosphorus: 3.9 mg/dL (ref 2.5–4.6)

## 2022-11-16 LAB — PREALBUMIN: Prealbumin: 22 mg/dL (ref 18–38)

## 2022-11-16 LAB — CBC
HCT: 37.6 % — ABNORMAL LOW (ref 39.0–52.0)
Hemoglobin: 12.9 g/dL — ABNORMAL LOW (ref 13.0–17.0)
MCH: 33.7 pg (ref 26.0–34.0)
MCHC: 34.3 g/dL (ref 30.0–36.0)
MCV: 98.2 fL (ref 80.0–100.0)
Platelets: 237 10*3/uL (ref 150–400)
RBC: 3.83 MIL/uL — ABNORMAL LOW (ref 4.22–5.81)
RDW: 12.7 % (ref 11.5–15.5)
WBC: 8.1 10*3/uL (ref 4.0–10.5)
nRBC: 0 % (ref 0.0–0.2)

## 2022-11-16 LAB — MAGNESIUM: Magnesium: 2 mg/dL (ref 1.7–2.4)

## 2022-11-16 LAB — T4, FREE: Free T4: 0.84 ng/dL (ref 0.61–1.12)

## 2022-11-16 MED ORDER — ADULT MULTIVITAMIN W/MINERALS CH
1.0000 | ORAL_TABLET | Freq: Every day | ORAL | Status: DC
  Administered 2022-11-16 – 2022-11-19 (×4): 1 via ORAL
  Filled 2022-11-16 (×4): qty 1

## 2022-11-16 MED ORDER — VITAMIN B-12 1000 MCG PO TABS
1000.0000 ug | ORAL_TABLET | Freq: Every day | ORAL | Status: DC
  Administered 2022-11-16 – 2022-11-19 (×4): 1000 ug via ORAL
  Filled 2022-11-16 (×4): qty 1

## 2022-11-16 MED ORDER — OMEGA-3-ACID ETHYL ESTERS 1 G PO CAPS
2.0000 g | ORAL_CAPSULE | Freq: Every day | ORAL | Status: DC
  Administered 2022-11-16 – 2022-11-19 (×4): 2 g via ORAL
  Filled 2022-11-16 (×4): qty 2

## 2022-11-16 MED ORDER — IMMUNE GLOBULIN (HUMAN) 10 GM/100ML IV SOLN
400.0000 mg/kg | INTRAVENOUS | Status: DC
  Administered 2022-11-16: 30 g via INTRAVENOUS
  Filled 2022-11-16 (×2): qty 300

## 2022-11-16 MED ORDER — SODIUM CHLORIDE 0.9 % IV BOLUS
500.0000 mL | INTRAVENOUS | Status: DC
  Administered 2022-11-16: 500 mL via INTRAVENOUS

## 2022-11-16 NOTE — Evaluation (Signed)
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 PMH sig for GBS, HTN, COPD.   Clinical Impression   Pt presents to OT with deficits in dynamic balance, [facility] bimanually, and coordination deficits that affect his ability to perform ADLs/IADls with PLOF. Pt known to this OT from recent CIR stay. Pt close to level he was discharged at, (S) with the RW and CGA-minA without a device for ADLs and ADL transfers. Recommend he continue OPOT services to continue at d/c. Pt would benefit from continued acute OT services to facilitate safe d/c home and optimize occupational performance.      Recommendations for follow up therapy are one component of a multi-disciplinary discharge planning process, led by the attending physician.  Recommendations may be updated based on patient status, additional functional criteria and insurance authorization.   Follow Up Recommendations  Outpatient OT     Assistance Recommended at Discharge Intermittent Supervision/Assistance  Patient can return home with the following A little help with walking and/or transfers;A little help with bathing/dressing/bathroom    Functional Status Assessment  Patient has had a recent decline in their functional status and demonstrates the ability to make significant improvements in function in a reasonable and predictable amount of time.  Equipment Recommendations  None recommended by OT    Recommendations for Other Services       Precautions / Restrictions Precautions Precautions: Fall Precaution Comments: Neuropathy in Bil lower legs and feet, as well as Bil fingertips Restrictions Weight Bearing Restrictions: No      Mobility Bed Mobility Overal bed mobility: Needs Assistance Bed Mobility: Supine to Sit      Supine to sit: Supervision Sit to supine: Supervision        Transfers Overall transfer level: Needs assistance Equipment used: Rolling walker (2 wheels) Transfers: Sit to/from Stand, Bed to chair/wheelchair/BSC Sit to Stand: Supervision Stand pivot transfers: Supervision                Balance Overall balance assessment: Needs assistance Sitting-balance support: No upper extremity supported, Feet supported Sitting balance-Leahy Scale: Good     Standing balance support: Bilateral upper extremity supported, During functional activity Standing balance-Leahy Scale: Poor Standing balance comment: (S) with the RW, CGA- min A without a device                           ADL either performed or assessed with clinical judgement   ADL   Eating/Feeding: Set up;Sitting   Grooming: Wash/dry face;Sitting;Set up   Upper Body Bathing: Set up;Sitting   Lower Body Bathing: Sit to/from stand;Supervison/ safety   Upper Body Dressing : Set up;Sitting   Lower Body Dressing: Supervision/safety   Toilet Transfer: Supervision/safety;Rolling walker (2 wheels)   Toileting- Clothing Manipulation and Hygiene: Supervision/safety;Sit to/from stand       Functional mobility during ADLs: Supervision/safety;Rolling walker (2 wheels) General ADL Comments: Overall supervision for ADLs with the (S). Close to baseline from previous admission     Vision Baseline Vision/History: 0 No visual deficits Ability to See in Adequate Light: 0 Adequate Patient Visual Report: No change from baseline Vision Assessment?: No apparent visual deficits     Perception     Praxis      Pertinent Vitals/Pain Pain Assessment Pain Assessment: 0-10 Pain Score: 4  Pain Descriptors / Indicators: Discomfort, Pins and needles, Numbness Pain Intervention(s): Limited activity within patient's tolerance, Repositioned     Hand Dominance Right   Extremity/Trunk Assessment Upper Extremity  Assessment Upper Extremity Assessment: Overall WFL for tasks assessed RUE Deficits / Details: Sensation changes in fingers but gross motor WNL. Some higher level coordination deficits RUE Sensation: decreased light touch;decreased proprioception RUE Coordination: decreased fine motor LUE Sensation: decreased light touch;decreased proprioception LUE Coordination: decreased fine motor   Lower Extremity Assessment Lower Extremity Assessment: Defer to PT evaluation   Cervical / Trunk Assessment Cervical / Trunk Assessment: Normal   Communication Communication Communication: No difficulties   Cognition Arousal/Alertness: Awake/alert Behavior During Therapy: WFL for tasks assessed/performed Overall Cognitive Status: Within Functional Limits for tasks assessed                                       General Comments  Wife present and supportive    Exercises     Shoulder Instructions      Home Living Family/patient expects to be discharged to:: Private residence Living Arrangements: Spouse/significant other Available Help at Discharge: Family;Available 24 hours/day Type of Home: House Home Access: Ramped entrance     Home Layout: One level     Bathroom Shower/Tub: Occupational psychologist: Standard Bathroom Accessibility: Yes How Accessible: Accessible via wheelchair;Accessible via walker Home Equipment: Grab bars - tub/shower;Shower Land (2 wheels);Wheelchair - manual   Additional Comments: DME was wife's after prior hospitalization  Lives With: Spouse    Prior Functioning/Environment Prior Level of Function : Needs assist       Physical Assist : Mobility (physical);ADLs (physical) (pt left CIR at (S)- mod I level overall 12/30)     Mobility Comments: Manages farm; provides maintenance          OT Problem List: Decreased activity tolerance;Decreased strength;Impaired balance (sitting and/or standing);Decreased  coordination;Decreased safety awareness;Decreased knowledge of use of DME or AE;Impaired sensation;Impaired UE functional use      OT Treatment/Interventions: Self-care/ADL training;Therapeutic exercise;DME and/or AE instruction;Therapeutic activities;Patient/family education;Balance training;Energy conservation    OT Goals(Current goals can be found in the care plan section) Acute Rehab OT Goals Patient Stated Goal: get home OT Goal Formulation: With patient/family Time For Goal Achievement: 11/30/22 Potential to Achieve Goals: Good  OT Frequency: Min 2X/week    Co-evaluation              AM-PAC OT "6 Clicks" Daily Activity     Outcome Measure Help from another person eating meals?: None Help from another person taking care of personal grooming?: None Help from another person toileting, which includes using toliet, bedpan, or urinal?: A Little Help from another person bathing (including washing, rinsing, drying)?: A Little Help from another person to put on and taking off regular upper body clothing?: None Help from another person to put on and taking off regular lower body clothing?: A Little 6 Click Score: 21   End of Session Equipment Utilized During Treatment: Gait belt;Rolling walker (2 wheels) Nurse Communication: Mobility status  Activity Tolerance: Patient tolerated treatment well Patient left: in chair;with call bell/phone within reach  OT Visit Diagnosis: Unsteadiness on feet (R26.81);Repeated falls (R29.6);Muscle weakness (generalized) (M62.81)                Time: 2542-7062 OT Time Calculation (min): 12 min Charges:  OT General Charges $OT Visit: 1 Visit OT  Evaluation $OT Eval Low Complexity: 1 Low  Laverle Hobby, OTR/L, CBIS Acute Rehab Office: [place] 11/16/2022, 9:27 AM

## 2022-11-16 NOTE — Plan of Care (Signed)

## 2022-11-16 NOTE — Evaluation (Signed)
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 PMH sig for GBS, HTN, COPD.  Clinical Impression  Pt seen for PT evaluation with pt agreeable to tx. Pt reports he was initiating OPPT following d/c from CIR. Pt has been ambulatory with RW without assistance from wife prior to this admission. On this date, pt is able to complete STS with supervision, ambulate increased distances with RW & CGA, and tolerate standing without BUE support with CGA. Pt is motivated to return to PLOF, does endorse ongoing numbness/tingling in BLE. Recommend pt resume with OPPT services upon d/c from acute setting.     Recommendations for follow up therapy are one component of a multi-disciplinary discharge planning process, led by the attending physician.  Recommendations may be updated based on patient status, additional functional criteria and insurance authorization.  Follow Up Recommendations Outpatient PT      Assistance Recommended at Discharge Intermittent Supervision/Assistance  Patient can return home with the following  A little help with walking and/or transfers;A little help with bathing/dressing/bathroom;Assistance with cooking/housework;Assist for transportation;Help with stairs or ramp for entrance    Equipment Recommendations None recommended by PT  Recommendations for Other Services       Functional Status Assessment Patient has had a recent decline in their functional status and demonstrates the ability to make significant improvements in function in a reasonable and predictable amount of time.     Precautions / Restrictions Precautions Precautions: Fall Precaution Comments: Neuropathy in Bil lower legs and feet, as well as Bil fingertips Restrictions Weight Bearing Restrictions: No       Mobility  Bed Mobility               General bed mobility comments: not tested, pt received & left sitting in recliner    Transfers Overall transfer level: Needs assistance Equipment used: Rolling walker (2 wheels) Transfers: Sit to/from Stand Sit to Stand: Supervision                Ambulation/Gait Ambulation/Gait assistance: Min guard Gait Distance (Feet):  (>400 ft) Assistive device: Rollator (4 wheels) Gait Pattern/deviations: Decreased dorsiflexion - right, Decreased dorsiflexion - left, Decreased stride length Gait velocity: slightly decreased     General Gait Details: decreased coordination, decreased strength distally & proximally, reliance on BUE on RW for support/stability. Education re: hand placement positioning on RW, need to ambulate within base of AD.  Stairs            Wheelchair Mobility    Modified [place] (Stroke Patients Only)       Balance Overall balance assessment: Needs assistance Sitting-balance support: No upper extremity supported, Feet supported Sitting balance-Leahy Scale: Good     Standing balance support: Bilateral upper extremity supported, No upper extremity supported, During functional activity Standing balance-Leahy Scale: Poor Standing balance comment: Pt requires CGA for static standing to void in bathroom.                             Pertinent Vitals/Pain Pain Assessment Pain Assessment: No/denies pain    Home Living Family/patient expects to be discharged to:: Private residence Living Arrangements: Spouse/significant other Available Help at Discharge: Family;Available 24 hours/day Type of Home: House Home Access: Ramped entrance       Home Layout: One  level Home Equipment: Grab bars - tub/shower;Shower Land (2 wheels);Wheelchair - manual Additional Comments: DME was wife's after prior hospitalization    Prior Function Prior Level of Function : Needs assist        Physical Assist : Mobility (physical);ADLs (physical) (Pt left CIR at supervision<>mod I level overall on 12/30)     Mobility Comments: Manages farm; provides maintenance       Hand Dominance   Dominant Hand: Right    Extremity/Trunk Assessment   Upper Extremity Assessment Upper Extremity Assessment: Defer to OT evaluation RUE Deficits / Details: Sensation changes in fingers but gross motor WNL. Some higher level coordination deficits RUE Sensation: decreased light touch;decreased proprioception RUE Coordination: decreased fine motor LUE Sensation: decreased light touch;decreased proprioception LUE Coordination: decreased fine motor    Lower Extremity Assessment Lower Extremity Assessment: Generalized weakness RLE Deficits / Details: Pt endorses numbness/tingling in BLE    Cervical / Trunk Assessment Cervical / Trunk Assessment: Normal  Communication   Communication: No difficulties  Cognition Arousal/Alertness: Awake/alert Behavior During Therapy: WFL for tasks assessed/performed Overall Cognitive Status: Within Functional Limits for tasks assessed                                 General Comments: Very pleasant gentleman, good awareness of current situation & CLOF.        General Comments General comments (skin integrity, edema, etc.): Pt with continent void.    Exercises     Assessment/Plan    PT Assessment Patient needs continued PT services  PT Problem List Decreased strength;Decreased range of motion;Decreased activity tolerance;Decreased balance;Decreased mobility;Decreased coordination;Decreased knowledge of use of DME;Impaired sensation;Impaired tone       PT Treatment Interventions DME instruction;Gait training;Functional mobility training;Therapeutic activities;Therapeutic exercise;Balance training;Neuromuscular re-education;Patient/family education;Wheelchair mobility training    PT Goals (Current goals can be found in the Care Plan  section)  Acute Rehab PT Goals Patient Stated Goal: get better, return to PLOF PT Goal Formulation: With patient Time For Goal Achievement: 11/30/22 Potential to Achieve Goals: Good    Frequency Min 2X/week     Co-evaluation               AM-PAC PT "6 Clicks" Mobility  Outcome Measure Help needed turning from your back to your side while in a flat bed without using bedrails?: None Help needed moving from lying on your back to sitting on the side of a flat bed without using bedrails?: None Help needed moving to and from a bed to a chair (including a wheelchair)?: A Little Help needed standing up from a chair using your arms (e.g., wheelchair or bedside chair)?: A Little Help needed to walk in hospital room?: A Little Help needed climbing 3-5 steps with a railing? : A Lot 6 Click Score: 19    End of Session   Activity Tolerance: Patient tolerated treatment well Patient left: in chair;with call bell/phone within reach   PT Visit Diagnosis: Unsteadiness on feet (R26.81);Other abnormalities of gait and mobility (R26.89);Difficulty in walking, not elsewhere classified (R26.2);Other symptoms and signs involving the nervous system (R29.898)    Time: 3335-4562 PT Time Calculation (min) (ACUTE ONLY): 20 min   Charges:   PT Evaluation $PT Eval Low Complexity: [place], PT, DPT 11/16/22, 12:50 PM   Waunita Schooner 11/16/2022, 12:49 PM

## 2022-11-16 NOTE — Progress Notes (Signed)
Patient performed NIF -40 with great effort. In no distress at this time.

## 2022-11-16 NOTE — TOC Initial Note (Signed)
Transition of Care [facility]) - Initial/Assessment Note    Patient Details  Name: Joe Greene MRN: [id number] Date of Birth: 1951/03/14  Transition of Care [facility]) CM/SW Contact:    Ninfa Meeker, RN Phone Number: 11/16/2022, 12:26 PM  Clinical Narrative:                  Transition of Care Screening Note:  Transition of Care [facility]) Department has reviewed patient and no TOC needs have been identified at this time. We will continue to monitor patient advancement through Interdisciplinary progressions and if new patient needs arise, please place a consult.        Patient Goals and CMS Choice            Expected Discharge Plan and Services                                              Prior Living Arrangements/Services                       Activities of Daily Living Home Assistive Devices/Equipment: Environmental consultant (specify type) (Front wheel) ADL Screening (condition at time of admission) Patient's cognitive ability adequate to safely complete daily activities?: Yes Is the patient deaf or have difficulty hearing?: No Does the patient have difficulty seeing, even when wearing glasses/contacts?: No Does the patient have difficulty concentrating, remembering, or making decisions?: No Patient able to express need for assistance with ADLs?: Yes Does the patient have difficulty dressing or bathing?: No Independently performs ADLs?: Yes (appropriate for developmental age) Communication: Independent Dressing (OT): Independent Is this a change from baseline?: Pre-admission baseline Does the patient have difficulty walking or climbing stairs?: Yes Weakness of Legs: Both Weakness of Arms/Hands: None  Permission Sought/Granted                  Emotional Assessment              Admission diagnosis:  Acute inflammatory demyelinating polyneuropathy ([place]) [G61.0] Guillain Barr syndrome ([place]) [G61.0] Patient Active Problem List   Diagnosis Date Noted    Guillain Barr syndrome ([place]) 11/15/2022   Constipation 11/15/2022   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[place]) 11/02/2022   Acute sensory neuropathy 10/25/2022   Acute inflammatory demyelinating polyneuropathy ([place]) 10/24/2022   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[place]) 10/18/2022   B12 deficiency 10/18/2022   Gait abnormality 10/18/2022   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 10/10/2022   Pre-diabetes 09/25/2022   AAA (abdominal aortic aneurysm) ([place]) 09/24/2022   Essential hypertension 09/24/2022   Iron deficiency 04/04/2022   Hypertriglyceridemia 04/03/2022   Coronary artery calcification 01/18/2022   Candidal intertrigo    Emphysema of lung ([place]) 07/26/2021   Aortic atherosclerosis ([place]) 07/26/2021   Internal hemorrhoids 01/23/2021   Diverticula of colon 01/23/2021   Eczema 01/23/2021   Actinic keratoses 01/23/2021   Tobacco use 09/02/2020   PCP:  Haydee Salter, MD Pharmacy:   [facility] [place], [address] [place] [address] Phone: [phone] Fax: [phone]  [facility] [address] [place] [address] Phone: [phone] Fax: [phone]     Social Determinants of Health (SDOH) Social History: SDOH Screenings   Food Insecurity: No Food Insecurity (11/16/2022)  Housing: Low Risk  (11/16/2022)  Transportation Needs: No Transportation Needs (11/16/2022)  Utilities: Not At Risk (11/16/2022)  Alcohol Screen: Low Risk  (11/14/2021)  Depression (PHQ2-9): Low Risk  (11/14/2021)  Financial Resource Strain: Low Risk  (11/14/2021)  Physical Activity: Sufficiently Active (11/14/2021)  Social Connections: Moderately Integrated (11/14/2021)  Stress: No Stress Concern Present (11/14/2021)  Tobacco Use: Medium Risk (11/16/2022)   SDOH Interventions:     Readmission Risk Interventions     No data to display

## 2022-11-16 NOTE — Progress Notes (Signed)
PROGRESS NOTE    Joe Greene  EML:[id number] DOB: [birth year] DOA: 11/15/2022 PCP: Haydee Salter, MD    Brief Narrative:  Joe Greene is a 72 y.o. male with medical history significant of Guillain-Barr syndrome with variant, acute inflammatory demyelinating polyneuropathy, hypertension, iron deficiency, hyperlipidemia, COPD, history of tobacco abuse presented to hospital with progressive tingling numbness of his lower extremities and hands progressive since in mid November when he was diagnosed of Guillain-Barr syndrome.  This is likely a third episode for the same.  Had been on IVIG in the past including plasma exchange with some improvement.  Symptoms progressed so the patient presented to hospital for further evaluation and treatment.    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  Neurology has seen the patient.  Has been started on IVIG at this time and plan is for 5 doses.  Follow neurology recommendations/labs sent by neurology..  Patient has been restarted on B12, omega-3 fatty acids.    Urinalysis was negative.  No respiratory symptoms.  Medicated with neurology as well.  Essential hypertension Latest blood pressure seems to be stable.  No antihypertensives listed in the med rec.    Pre-diabetes Currently diet controlled.   Constipation Continue bowel regimen.  Neuropathy, weakness.  PT OT consulted.  OT recommends outpatient 0T     DVT prophylaxis: SCDs Start: 11/15/22 1927   Code Status:     Code Status: Full Code  Disposition: Home /outpatient PT OT  Status is: Inpatient  Remains inpatient appropriate because: Pending clinical improvement, on IVIG,   Family Communication: Spoke with the patient's wife at bedside.  Consultants:  Neurology  Procedures:  IVIG administration  Antimicrobials:  None  Anti-infectives (From admission, onward)    None       Subjective: Today, patient was seen and examined at bedside.  Patient still complains of tingling, numbness and bilateral lower extremities and hands with subjective weakness.  Denies any shortness of breath cough fever chills or rigor.  Patient's spouse at bedside.  Objective: Vitals:   11/16/22 0230 11/16/22 0400 11/16/22 0415 11/16/22 0937  BP: 120/77 (!) 123/58 104/64 123/70  Pulse:   69 65  Resp: 17 15 16 17   Temp:   97.7 F (36.5 C) 97.9 F (36.6 C)  TempSrc:   Oral Oral  SpO2:    94%  Weight:      Height:        Intake/Output Summary (Last 24 hours) at 11/16/2022 1159 Last data filed at 11/16/2022 0910 Gross per 24 hour  Intake 500 ml  Output --  Net 500 ml   Filed Weights   11/15/22 0735  Weight: 88.5 kg    Physical Examination: Body mass index is 27.98 kg/m.  General:  Average built, not in obvious distress HENT:   No scleral pallor or icterus noted. Oral mucosa is moist.  Chest:    Diminished breath sounds bilaterally. No crackles or wheezes.  CVS: S1 &S2 heard. No murmur.  Regular rate and rhythm. Abdomen: Soft, nontender, nondistended.  Bowel sounds are heard.   Extremities: No cyanosis, clubbing or edema.  Peripheral pulses are palpable. Psych: Alert, awake and oriented, normal mood CNS:  No cranial nerve deficits.  Power 4/5 bilateral lower extremities.  Sensation over the bilateral lower extremities, Skin: Warm and dry.  No rashes noted.  Data Reviewed:   CBC: Recent Labs  Lab 11/15/22 0751 11/16/22 0521  WBC  7.0 8.1  NEUTROABS 3.6  --   HGB 14.5 12.9*  HCT 43.3 37.6*  MCV 98.9 98.2  PLT 283 449    Basic Metabolic Panel: Recent Labs  Lab 11/15/22 0751 11/15/22 2000 11/16/22 0521  NA 136  --  137  K 4.2  --  3.8  CL 102  --  108  CO2 21*  --  21*  GLUCOSE 117*  --  115*  BUN 18  --  19  CREATININE 0.88  --  0.93  CALCIUM 9.3  --  8.3*  MG  --  2.2 2.0  PHOS  --  4.6 3.9    Liver Function Tests: Recent Labs  Lab 11/15/22 0751  11/16/22 0521  AST 20 18  ALT 27 24  ALKPHOS 37* 32*  BILITOT 0.5 0.8  PROT 6.7 6.1*  ALBUMIN 4.1 3.4*     Radiology Studies: No results found.    LOS: 1 day    Flora Lipps, MD [facility] Available via Epic secure chat 7am-7pm After these hours, please refer to coverage provider listed on [email or web address] 11/16/2022, 11:59 AM

## 2022-11-17 DIAGNOSIS — G610 Guillain-Barre syndrome: Secondary | ICD-10-CM | POA: Diagnosis not present

## 2022-11-17 LAB — CBC
HCT: 39.9 % (ref 39.0–52.0)
Hemoglobin: 13.7 g/dL (ref 13.0–17.0)
MCH: 33.3 pg (ref 26.0–34.0)
MCHC: 34.3 g/dL (ref 30.0–36.0)
MCV: 96.8 fL (ref 80.0–100.0)
Platelets: 237 10*3/uL (ref 150–400)
RBC: 4.12 MIL/uL — ABNORMAL LOW (ref 4.22–5.81)
RDW: 12.5 % (ref 11.5–15.5)
WBC: 5.3 10*3/uL (ref 4.0–10.5)
nRBC: 0.4 % — ABNORMAL HIGH (ref 0.0–0.2)

## 2022-11-17 LAB — BASIC METABOLIC PANEL
Anion gap: 11 (ref 5–15)
BUN: 16 mg/dL (ref 8–23)
CO2: 19 mmol/L — ABNORMAL LOW (ref 22–32)
Calcium: 8.9 mg/dL (ref 8.9–10.3)
Chloride: 107 mmol/L (ref 98–111)
Creatinine, Ser: 0.98 mg/dL (ref 0.61–1.24)
GFR, Estimated: >60 mL/min (ref >60)
Glucose, Bld: 100 mg/dL — ABNORMAL HIGH (ref 70–99)
Potassium: 4.1 mmol/L (ref 3.5–5.1)
Sodium: 137 mmol/L (ref 135–145)

## 2022-11-17 LAB — MAGNESIUM: Magnesium: 2.1 mg/dL (ref 1.7–2.4)

## 2022-11-17 MED ORDER — IMMUNE GLOBULIN (HUMAN) 10 GM/100ML IV SOLN
400.0000 mg/kg | INTRAVENOUS | Status: DC
  Administered 2022-11-17: 30 g via INTRAVENOUS
  Filled 2022-11-17 (×3): qty 300

## 2022-11-17 MED ORDER — SODIUM CHLORIDE 0.9 % IV BOLUS: 500.0000 mL | INTRAVENOUS | Status: DC

## 2022-11-17 MED ORDER — SODIUM CHLORIDE 0.9 % IV BOLUS
500.0000 mL | INTRAVENOUS | Status: DC
  Administered 2022-11-17: 500 mL via INTRAVENOUS

## 2022-11-17 NOTE — Progress Notes (Signed)
PROGRESS NOTE    Joe Greene  HCW:[id number] DOB: 1951/01/28 DOA: 11/15/2022 PCP: Haydee Salter, MD    Brief Narrative:  Joe Greene is a 72 y.o. male with medical history significant of Guillain-Barr syndrome with variant, acute inflammatory demyelinating polyneuropathy, hypertension, iron deficiency, hyperlipidemia, COPD, history of tobacco abuse presented to hospital with progressive tingling numbness of his lower extremities and hands progressive since in mid November when he was diagnosed of Guillain-Barr syndrome.  This is likely a third episode for the same.  Had been on IVIG in the past including plasma exchange with some improvement.  Symptoms progressed so the patient presented to hospital for further evaluation and treatment.   11/17/2022: Patient seen alongside patient's wife.  Patient is currently on a course of IVIG.  IVIG will be completed in 2 days time.  Neurology input is appreciated.   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  Neurology has seen the patient.  Has been started on IVIG at this time and plan is for 5 doses.  Follow neurology recommendations/labs sent by neurology..  Patient has been restarted on B12, omega-3 fatty acids.    Urinalysis was negative.  No respiratory symptoms.  Medicated with neurology as well. 11/17/2022: Completed course of IVIG.  Essential hypertension Latest blood pressure seems to be stable.  No antihypertensives listed in the med rec.    Pre-diabetes Currently diet controlled.   Constipation Continue bowel regimen.  Neuropathy, weakness.  PT OT consulted.  OT recommends outpatient 0T     DVT prophylaxis: SCDs Start: 11/15/22 1927   Code Status:     Code Status: Full Code  Disposition: Home /outpatient PT OT  Status is: Inpatient  Remains inpatient appropriate because: Pending clinical improvement, on IVIG,   Family  Communication: Spoke with the patient's wife at bedside.  Consultants:  Neurology  Procedures:  IVIG administration  Antimicrobials:  None  Anti-infectives (From admission, onward)    None      Subjective: -Patient continues to complain of the tingling, soreness, fasciculation of the calf muscles.  Objective: Vitals:   11/16/22 0230 11/16/22 0400 11/16/22 0415 11/16/22 0937  BP: 120/77 (!) 123/58 104/64 123/70  Pulse:   69 65  Resp: 17 15 16 17   Temp:   97.7 F (36.5 C) 97.9 F (36.6 C)  TempSrc:   Oral Oral  SpO2:    94%  Weight:      Height:        Intake/Output Summary (Last 24 hours) at 11/16/2022 1159 Last data filed at 11/16/2022 0910 Gross per 24 hour  Intake 500 ml  Output --  Net 500 ml   Filed Weights   11/15/22 0735  Weight: 88.5 kg    Physical Examination: General: Not in any distress.  Awake and alert. HEENT: No pallor.  No jaundice. Neck: Supple. Lungs: Clear to auscultation. CVS: S1-S2. Abdomen: Soft and nontender. Neuro: Awake and alert.  Moves all extremities. Extremities: No leg edema.   Data Reviewed:   CBC: Recent Labs  Lab 11/15/22 0751 11/16/22 0521  WBC 7.0 8.1  NEUTROABS 3.6  --   HGB 14.5 12.9*  HCT 43.3 37.6*  MCV 98.9 98.2  PLT 283 176    Basic Metabolic Panel: Recent Labs  Lab 11/15/22 0751 11/15/22 2000 11/16/22 0521  NA 136  --  137  K 4.2  --  3.8  CL 102  --  108  CO2 21*  --  21*  GLUCOSE 117*  --  115*  BUN 18  --  19  CREATININE 0.88  --  0.93  CALCIUM 9.3  --  8.3*  MG  --  2.2 2.0  PHOS  --  4.6 3.9    Liver Function Tests: Recent Labs  Lab 11/15/22 0751 11/16/22 0521  AST 20 18  ALT 27 24  ALKPHOS 37* 32*  BILITOT 0.5 0.8  PROT 6.7 6.1*  ALBUMIN 4.1 3.4*     Radiology Studies: No results found.    LOS: 1 day    Joycelyn Das, MD [facility] Available via Epic secure chat 7am-7pm After these hours, please refer to coverage provider listed on [email or web address] 11/16/2022,  11:59 AM

## 2022-11-18 DIAGNOSIS — G610 Guillain-Barre syndrome: Secondary | ICD-10-CM | POA: Diagnosis not present

## 2022-11-18 MED ORDER — IMMUNE GLOBULIN (HUMAN) 10 GM/100ML IV SOLN
400.0000 mg/kg | INTRAVENOUS | Status: AC
  Administered 2022-11-18 – 2022-11-19 (×2): 30 g via INTRAVENOUS
  Filled 2022-11-18 (×2): qty 300

## 2022-11-18 MED ORDER — GABAPENTIN 300 MG PO CAPS
600.0000 mg | ORAL_CAPSULE | Freq: Three times a day (TID) | ORAL | Status: DC
  Administered 2022-11-18 – 2022-11-19 (×2): 600 mg via ORAL
  Filled 2022-11-18 (×2): qty 2

## 2022-11-18 MED ORDER — SODIUM CHLORIDE 0.9 % IV BOLUS
500.0000 mL | INTRAVENOUS | Status: DC
  Administered 2022-11-18 – 2022-11-19 (×2): 500 mL via INTRAVENOUS

## 2022-11-18 MED ORDER — SODIUM CHLORIDE 0.9 % IV BOLUS
500.0000 mL | INTRAVENOUS | Status: DC
  Administered 2022-11-18: 500 mL via INTRAVENOUS

## 2022-11-18 MED ORDER — SODIUM CHLORIDE 0.9 % IV BOLUS
500.0000 mL | INTRAVENOUS | Status: DC
  Administered 2022-11-19: 500 mL via INTRAVENOUS

## 2022-11-18 NOTE — Progress Notes (Signed)
Neurology Progress Note  Brief HPI: Patient who was diagnosed with AIDP in November initially received IVIG with some improvement, noted worsening of symptoms and received Plex in December.  He was then discharged to CIR, where he continued to improve, but earlier this week symptoms returned and patient returned to the hospital for another course of IVIG.  Patient states that he has bilateral leg weakness as well as losing the dexterity in his hands.  He has difficulty ambulating with a walker.  Patient has received 3 doses of IVIG this hospitalization so far, but does not so far note any improvement in symptoms.  He states that his legs may have actually gotten weaker, but also states that he has not been out of bed much during this hospitalization.  Subjective: Patient reports that he has not been out of bed much and verbalizes intent to ambulate with a walker today.  He states he is losing the dexterity in his hands and does not feel he has had improvement in his symptoms with IVIG so far.  He denies any respiratory difficulties and has been able to eat his breakfast without problems.  On later attending evaluation he notes he is actually feeling stable to improved and may be having fluctuating neuropathy symptoms  Exam: Vitals:   11/17/22 2220 11/18/22 0355  BP: 126/70 115/63  Pulse: 65 (!) 58  Resp: 16 18  Temp: 98.1 F (36.7 C) 97.9 F (36.6 C)  SpO2: 96% 96%   Gen: In bed, NAD Resp: non-labored breathing, no acute distress Abd: soft, nt  Neuro: Mental Status: Alert and oriented x 4, calm and cooperative with examination Cranial Nerves: Pupils equal round and reactive, extraocular movements intact, face symmetrical, hearing intact to voice, phonation normal, tongue midline Motor: On confrontational strength testing, 5 out of 5 shoulder abduction, 5 out of 5 elbow extension and elbow flexion, 5 out of 5 wrist extension and flexion, 5 out of 5 finger spread on right, 4+ out of 5 finger  spread on left.  In bilateral lower extremities, 4 out of 5 hip flexion, 4 out of 5 knee extension and knee flexion, 4 out of 5 dorsiflexion and plantarflexion On later attending eval: Attending eval 4/5 hip flexion bilaterally, 5/5 knee extension, 4+/5 knee flexion, 4+/5 foot dorsiflexion, 5/5 plantarflexion, stable from my admission exam Sensory: Intact to light touch throughout Gait: Deferred  Pertinent Labs:    Latest Ref Rng & Units 11/17/2022    3:59 AM 11/16/2022    5:21 AM 11/15/2022    7:51 AM  CBC  WBC 4.0 - 10.5 K/uL 5.3  8.1  7.0   Hemoglobin 13.0 - 17.0 g/dL 71.0  62.6  94.8   Hematocrit 39.0 - 52.0 % 39.9  37.6  43.3   Platelets 150 - 400 K/uL 237  237  283        Latest Ref Rng & Units 11/17/2022    3:59 AM 11/16/2022    5:21 AM 11/15/2022    7:51 AM  BMP  Glucose 70 - 99 mg/dL 546  270  350   BUN 8 - 23 mg/dL 16  19  18    Creatinine 0.61 - 1.24 mg/dL  0.93  8.18   Sodium 135 - 145 mmol/L 137  137  136   Potassium 3.5 - 5.1 mmol/L 4.1  3.8  4.2   Chloride 98 - 111 mmol/L 107  108  102   CO2 22 - 32 mmol/L 19  21  21   Calcium 8.9 - 10.3 mg/dL 8.9  8.3  9.3      Imaging Reviewed:  None this admission  Assessment: 72 year old patient with history of AIDP returns for second course of IVIG given relapse of symptoms of leg weakness and further ascending paresthesias.  While he initially reported worsening symptoms to NP, on later attending evaluation he felt like his symptoms were stable or improving.  May be having some fluctuating symptoms from his neuropathy, will trial increase of gabapentin.  Additionally discussed that IVIG takes several days post completion of IVIG to see the full effects of the medication  Impression: CIDP  Recommendations: 1) continue IVIG for 2 more doses (timed every 20 hours to complete dosing slightly earlier) (today and tomorrow), with 500 mL normal saline pre and post fluid boluses for renal protection 2) mobilization with PT/OT 3) no  NIF/FVC checked yesterday, have reached out to respiratory to continue monitoring NIF/FVC due to neuromuscular condition while hospitalized for additional objective data on weakness 4) increase gabapentin to 600 mg 3 times daily, based on creatinine clearance room to increase up to 1200 mg 3 times daily gradually as tolerated until symptoms controlled 5) close outpatient follow-up scheduled with Dr. Krista Blue on Wednesday 6) inpatient neurology will be available as needed going forward, patient stable for discharge tomorrow with outpatient physical therapy  [place] , MSN, AGACNP-BC [facility] See Amion for schedule and pager information 11/18/2022 8:57 AM  Attending Neurologist's note:  I personally saw this patient, gathering history, performing a full neurologic examination, reviewing relevant labs, and formulated the assessment and plan, adding the note above for completeness and clarity to accurately reflect my thoughts  Lesleigh Noe MD-PhD [facility] [phone]

## 2022-11-18 NOTE — Progress Notes (Signed)
Patients wife stated patient does not wear CPAP anymore.

## 2022-11-18 NOTE — Progress Notes (Signed)
Patient has excellent vasculature. Please assess and attempt prior to placing IVT consult.   Allaina Brotzman Lorita Officer, RN

## 2022-11-18 NOTE — Progress Notes (Signed)
NIF= greater than 40 on three attempts VC= 1.85 average on three attempts Sp02=96% on room air. Breath Sounds clear

## 2022-11-18 NOTE — Progress Notes (Signed)
IVIG 30g completed with no adverse rxns noted, VSS. Pt remains alert/oriented in no apparent distress. No complaints.

## 2022-11-18 NOTE — Progress Notes (Addendum)
Mobility Specialist Progress Note   11/18/22 1345  Mobility  Activity Ambulated with assistance in hallway  Level of Assistance Contact guard assist, steadying assist  Assistive Device Front wheel walker  Distance Ambulated (ft) 2300 ft  Range of Motion/Exercises Active;All extremities  Activity Response Tolerated well   Patient received in supine and agreeable to participate. Ambulated min guard with slow steady gait. Did require standing rest break x1 second LE buckling. Returned to room without complaint or incident. Was left in supine with all needs met, call bell in reach.   Joe Greene, BS EXP Mobility Specialist Please contact via SecureChat or Rehab office at [phone]

## 2022-11-18 NOTE — Plan of Care (Signed)

## 2022-11-18 NOTE — Progress Notes (Signed)
PROGRESS NOTE    Joe Greene  IRS:[id number] DOB: [birth year] DOA: 11/15/2022 PCP: Haydee Salter, MD    Brief Narrative:  Joe Greene is a 72 y.o. male with medical history significant of Guillain-Barr syndrome with variant, acute inflammatory demyelinating polyneuropathy, hypertension, iron deficiency, hyperlipidemia, COPD, history of tobacco abuse presented to hospital with progressive tingling numbness of his lower extremities and hands progressive since in mid November when he was diagnosed of Guillain-Barr syndrome.  This is likely a third episode for the same.  Had been on IVIG in the past including plasma exchange with some improvement.  Symptoms progressed so the patient presented to hospital for further evaluation and treatment.   11/18/2022: Patient seen alongside patient's wife.  Patient is currently on a course of IVIG.  IVIG will be completed in tomorrow, 11/19/2022.  Neurology team is directing care.     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  Neurology has seen the patient.  Has been started on IVIG at this time and plan is for 5 doses.  Follow neurology recommendations/labs sent by neurology..  Patient has been restarted on B12, omega-3 fatty acids.    Urinalysis was negative.  No respiratory symptoms.  Medicated with neurology as well. 11/18/2022: Complete course of IVIG.  Essential hypertension Latest blood pressure seems to be stable.  No antihypertensives listed in the med rec.    Pre-diabetes Currently diet controlled.   Constipation Continue bowel regimen.  Neuropathy, weakness.  PT OT consulted.  OT recommends outpatient 0T     DVT prophylaxis: SCDs Start: 11/15/22 1927   Code Status:     Code Status: Full Code  Disposition: Home /outpatient PT OT  Status is: Inpatient  Remains inpatient appropriate because: Pending clinical improvement, on IVIG,   Family  Communication: Spoke with the patient's wife at bedside.  Consultants:  Neurology  Procedures:  IVIG administration  Antimicrobials:  None  Anti-infectives (From admission, onward)    None      Subjective: -Patient continues to complain of the tingling, soreness, fasciculation of the calf muscles.  Objective: Vitals:   11/16/22 0230 11/16/22 0400 11/16/22 0415 11/16/22 0937  BP: 120/77 (!) 123/58 104/64 123/70  Pulse:   69 65  Resp: 17 15 16 17   Temp:   97.7 F (36.5 C) 97.9 F (36.6 C)  TempSrc:   Oral Oral  SpO2:    94%  Weight:      Height:        Intake/Output Summary (Last 24 hours) at 11/16/2022 1159 Last data filed at 11/16/2022 0910 Gross per 24 hour  Intake 500 ml  Output --  Net 500 ml   Filed Weights   11/15/22 0735  Weight: 88.5 kg    Physical Examination: General: Not in any distress.  Awake and alert. HEENT: No pallor.  No jaundice. Neck: Supple. Lungs: Clear to auscultation. CVS: S1-S2. Abdomen: Soft and nontender. Neuro: Awake and alert.  Moves all extremities. Extremities: No leg edema.   Data Reviewed:   CBC: Recent Labs  Lab 11/15/22 0751 11/16/22 0521  WBC 7.0 8.1  NEUTROABS 3.6  --   HGB 14.5 12.9*  HCT 43.3 37.6*  MCV 98.9 98.2  PLT 283 009    Basic Metabolic Panel: Recent Labs  Lab 11/15/22 0751 11/15/22 2000 11/16/22 0521  NA 136  --  137  K 4.2  --  3.8  CL 102  --  108  CO2 21*  --  21*  GLUCOSE 117*  --  115*  BUN 18  --  19  CREATININE 0.88  --  0.93  CALCIUM 9.3  --  8.3*  MG  --  2.2 2.0  PHOS  --  4.6 3.9    Liver Function Tests: Recent Labs  Lab 11/15/22 0751 11/16/22 0521  AST 20 18  ALT 27 24  ALKPHOS 37* 32*  BILITOT 0.5 0.8  PROT 6.7 6.1*  ALBUMIN 4.1 3.4*     Radiology Studies: No results found.    LOS: 1 day    Joycelyn Das, MD [facility] Available via Epic secure chat 7am-7pm After these hours, please refer to coverage provider listed on [email or web address] 11/16/2022,  11:59 AM

## 2022-11-19 ENCOUNTER — Encounter: Payer: Medicare PPO | Admitting: Occupational Therapy

## 2022-11-19 ENCOUNTER — Ambulatory Visit: Payer: Medicare PPO | Admitting: Physical Therapy

## 2022-11-19 DIAGNOSIS — G610 Guillain-Barre syndrome: Secondary | ICD-10-CM | POA: Diagnosis not present

## 2022-11-19 MED ORDER — GABAPENTIN 300 MG PO CAPS: 600.0000 mg | ORAL_CAPSULE | Freq: Three times a day (TID) | ORAL | 0 refills | Status: DC

## 2022-11-19 NOTE — Plan of Care (Signed)
?  Problem: Health Behavior/Discharge Planning: ?Goal: Ability to manage health-related needs will improve ?Outcome: Progressing ?  ?Problem: Nutrition: ?Goal: Adequate nutrition will be maintained ?Outcome: Progressing ?  ?Problem: Pain Managment: ?Goal: General experience of comfort will improve ?Outcome: Progressing ?  ?Problem: Safety: ?Goal: Ability to remain free from injury will improve ?Outcome: Progressing ?  ?

## 2022-11-19 NOTE — TOC Transition Note (Signed)
Transition of Care [facility]) - CM/SW Discharge Note   Patient Details  Name: Joe Greene MRN: [id number] Date of Birth: [birth year]  [facility] of [facility]) CM/SW Contact:  Bartholomew Crews, RN Phone Number: [phone] 11/19/2022, 10:41 AM   Clinical Narrative:     Spoke with provider about patient needing outpatient rehab. Noted on AVS appointments already in place. No TOC needs identified at this time.   Final next level of care: OP Rehab Barriers to Discharge: No Barriers Identified   Patient Goals and CMS Choice      Discharge Placement                         Discharge Plan and Services Additional resources added to the After Visit Summary for                                       Social Determinants of Health (SDOH) Interventions SDOH Screenings   Food Insecurity: No Food Insecurity (11/16/2022)  Housing: Low Risk  (11/16/2022)  Transportation Needs: No Transportation Needs (11/16/2022)  Utilities: Not At Risk (11/16/2022)  Alcohol Screen: Low Risk  (11/14/2021)  Depression (PHQ2-9): Low Risk  (11/14/2021)  Financial Resource Strain: Low Risk  (11/14/2021)  Physical Activity: Sufficiently Active (11/14/2021)  Social Connections: Moderately Integrated (11/14/2021)  Stress: No Stress Concern Present (11/14/2021)  Tobacco Use: Medium Risk (11/16/2022)     Readmission Risk Interventions     No data to display

## 2022-11-19 NOTE — Progress Notes (Signed)
Occupational Therapy Treatment Patient Details Name: Joe Greene MRN: [id number] DOB: 02-01-1951 Today's Date: 11/19/2022   History of present illness Pt is a 72 yo M who presents 1/4 with 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 PMH sig for GBS, HTN, COPD.   OT comments  Patient making good gains with OT treatment with patient requiring supervision for self care tasks and mobility with occasion min guard for safety due to neuropathy. Patient motivated towards progress and would benefit from continued OT services in outpatient setting to continue to address balance and safety with functional mobility, functional transfers, and self care. Acute OT to continue to follow.    Recommendations for follow up therapy are one component of a multi-disciplinary discharge planning process, led by the attending physician.  Recommendations may be updated based on patient status, additional functional criteria and insurance authorization.    Follow Up Recommendations  Outpatient OT     Assistance Recommended at Discharge Intermittent Supervision/Assistance  Patient can return home with the following  A little help with walking and/or transfers;A little help with bathing/dressing/bathroom   Equipment Recommendations  None recommended by OT    Recommendations for Other Services      Precautions / Restrictions Precautions Precautions: Fall Precaution Comments: Neuropathy in Bil lower legs and feet, as well as Bil fingertips Restrictions Weight Bearing Restrictions: No       Mobility Bed Mobility Overal bed mobility: Needs Assistance Bed Mobility: Supine to Sit, Sit to Supine     Supine to sit: Supervision Sit to supine: Supervision   General bed mobility comments: no physical assistance needed with bed mobility    Transfers Overall transfer level: Needs assistance Equipment used: Rolling walker (2 wheels) Transfers: Sit to/from Stand Sit to Stand:  Supervision           General transfer comment: supervision for safety, occasional min guard     Balance Overall balance assessment: Needs assistance Sitting-balance support: No upper extremity supported, Feet supported Sitting balance-Leahy Scale: Good     Standing balance support: Bilateral upper extremity supported, No upper extremity supported, During functional activity Standing balance-Leahy Scale: Poor Standing balance comment: min guard when standing at sink, unsteady static standing with no UE support                           ADL either performed or assessed with clinical judgement   ADL Overall ADL's : Needs assistance/impaired     Grooming: Wash/dry hands;Wash/dry face;Oral care;Min guard;Standing Grooming Details (indicate cue type and reason): min guard for safety with balance due to BLE numbness                 Toilet Transfer: Supervision/safety;Rolling walker (2 wheels) Toilet Transfer Details (indicate cue type and reason): supervision to min guard for safety Toileting- Clothing Manipulation and Hygiene: Supervision/safety;Sit to/from stand         General ADL Comments: Supervision for self care    Extremity/Trunk Assessment Upper Extremity Assessment RUE Deficits / Details: Sensation changes in fingers but gross motor WNL. Some higher level coordination deficits RUE Sensation: decreased light touch;decreased proprioception RUE Coordination: decreased fine motor LUE Deficits / Details: reports numbness and decreased dexterity, also reprots able to complete ADLs with + time LUE Sensation: decreased light touch;decreased proprioception LUE Coordination: decreased fine motor            Vision       Perception     Praxis  Cognition Arousal/Alertness: Awake/alert Behavior During Therapy: WFL for tasks assessed/performed Overall Cognitive Status: Within Functional Limits for tasks assessed                                           Exercises      Shoulder Instructions       General Comments performed mobility in hallway with RW and supervision to min guard assist    Pertinent Vitals/ Pain       Pain Assessment Pain Assessment: Faces Faces Pain Scale: Hurts little more Pain Location: back and BLE after functional mobility Pain Descriptors / Indicators: Discomfort, Pins and needles, Numbness Pain Intervention(s): Limited activity within patient's tolerance, Monitored during session, Repositioned  Home Living                                          Prior Functioning/Environment              Frequency  Min 2X/week        Progress Toward Goals  OT Goals(current goals can now be found in the care plan section)  Progress towards OT goals: Progressing toward goals  Acute Rehab OT Goals Patient Stated Goal: get more rehab OT Goal Formulation: With patient/family Time For Goal Achievement: 11/30/22 Potential to Achieve Goals: Good ADL Goals Pt Will Perform Grooming: standing;with modified independence Pt Will Perform Lower Body Bathing: with modified independence Pt Will Perform Lower Body Dressing: with modified independence Pt Will Transfer to Toilet: with modified independence Pt Will Perform Toileting - Clothing Manipulation and hygiene: with modified independence Additional ADL Goal #1: Pt will demonstrate use of compensatory techniques for sensory loss of BUE.  Plan Discharge plan remains appropriate;Frequency remains appropriate    Co-evaluation                 AM-PAC OT "6 Clicks" Daily Activity     Outcome Measure   Help from another person eating meals?: None Help from another person taking care of personal grooming?: None Help from another person toileting, which includes using toliet, bedpan, or urinal?: A Little Help from another person bathing (including washing, rinsing, drying)?: A Little Help from another person to put on  and taking off regular upper body clothing?: None Help from another person to put on and taking off regular lower body clothing?: A Little 6 Click Score: 21    End of Session Equipment Utilized During Treatment: Gait belt;Rolling walker (2 wheels)  OT Visit Diagnosis: Unsteadiness on feet (R26.81);Repeated falls (R29.6);Muscle weakness (generalized) (M62.81)   Activity Tolerance Patient tolerated treatment well   Patient Left in bed;with call bell/phone within reach;with family/visitor present   Nurse Communication Mobility status        Time: 4098-1191 OT Time Calculation (min): 37 min  Charges: OT General Charges $OT Visit: 1 Visit OT Treatments $Self Care/Home Management : 8-22 mins $Therapeutic Activity: 8-22 mins  Alfonse Flavors, OTA Acute Rehabilitation Services  Office [phone]   Dewain Penning 11/19/2022, 10:24 AM

## 2022-11-19 NOTE — Progress Notes (Signed)
Mobility Specialist Progress Note   11/19/22 1204  Mobility  Activity Ambulated with assistance in hallway  Level of Assistance Minimal assist, patient does 75% or more  Assistive Device Front wheel walker  Distance Ambulated (ft) 550 ft  Activity Response Tolerated well  $Mobility charge 1 Mobility   Received in bed tired but agreeable. X1 bout of buckling in R knee but pt able to catch self w/ minA. Returned back to room w/o further faults or complaints and left in bed w/ all needs met.   Holland Falling Mobility Specialist Please contact via SecureChat or  Rehab office at [phone]

## 2022-11-19 NOTE — Progress Notes (Signed)
NIF: Greater than -40 VC: 1.9L  With excellent patient effort.

## 2022-11-19 NOTE — Care Management Important Message (Signed)
Important Message  Patient Details  Name: Joe Greene MRN: [id number] Date of Birth: Nov 14, 1950   Medicare Important Message Given:  Yes     Darleth Eustache Montine Circle 11/19/2022, 3:41 PM

## 2022-11-19 NOTE — Discharge Summary (Signed)
Physician Discharge Summary  Joe Greene LTJ:[id number] DOB: Nov 29, 1950 DOA: 11/15/2022  PCP: Haydee Salter, MD  Admit date: 11/15/2022 Discharge date: 11/19/2022 Recommendations for Outpatient Follow-up:  Follow up with PCP in 1 weeks-call for appointment Follow-up with neurology in 1 week Please obtain BMP/CBC in one week  Discharge Dispo: Home w/ OP PTOT Discharge Condition: Stable Code Status:   Code Status: Full Code Diet recommendation:  Diet Order             Diet regular Room service appropriate? Yes; Fluid consistency: Thin  Diet effective now                    Brief/Interim Summary: 72 y.o. male with medical history significant of Guillain-Barr syndrome with variant, acute inflammatory demyelinating polyneuropathy, hypertension, iron deficiency, hyperlipidemia, COPD, history of tobacco abuse presented to hospital with progressive tingling numbness of his lower extremities and hands progressive since in mid November when he was diagnosed of Guillain-Barr syndrome.  This is likely a third episode for the same.  Had been on IVIG in the past including plasma exchange with some improvement.  Symptoms progressed so the patient presented to hospital for further evaluation and treatment. Neurology was consulted.  Patient's Neurontin dose adjusted, placed on IVIG x 5 days NIF/FVC monitored by RT. 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 he is doing much better denies any numbness tingling and weakness, would like to be discharged after completing  fifth dose of IVIG today, cleared by neurology.  Outpatient PT OT has been already set up.    Essential hypertension BP stable Prediabetes continue diet control Constipation continue bowel regimen   Consults: Neurology  Subjective: Alert awake oriented wife at the bedside took shower this morning ambulating, no shortness of breath weakness numbness better.  Discharge Exam: Vitals:   11/18/22 2117 11/19/22 0811  BP: 138/89 (!) 142/82  Pulse: 67 66  Resp: 18 19  Temp: 97.9 F (36.6 C) 98 F (36.7 C)  SpO2: 96% 95%   General: Pt is alert, awake, not in acute distress Cardiovascular: RRR, S1/S2 +, no rubs, no gallops Respiratory: CTA bilaterally, no wheezing, no rhonchi Abdominal: Soft, NT, ND, bowel sounds + Extremities: no edema, no cyanosis  Discharge Instructions  Discharge Instructions     Discharge instructions   Complete by: As directed    Please call call MD or return to ER for similar or worsening recurring problem that brought you to hospital or if any fever,nausea/vomiting,abdominal pain, uncontrolled pain, chest pain,  shortness of breath or any other alarming symptoms.  Please follow-up your doctor as instructed in a week time and call the office for appointment.  Please avoid alcohol, smoking, or any other illicit substance and maintain healthy habits including taking your regular medications as prescribed.  You were cared for by a hospitalist during your hospital stay. If you have any questions about your discharge medications or the care you received while you were in the hospital after you are discharged, you can call the unit and ask to speak with the hospitalist on call if the hospitalist that  took care of you is not available.  Once you are discharged, your primary care physician will handle any further medical issues. Please note that NO REFILLS for any discharge medications will be authorized once you are discharged, as it is imperative that you return to  your primary care physician (or establish a relationship with a primary care physician if you do not have one) for your aftercare needs so that they can reassess your need for medications and monitor your lab values   Increase activity slowly   Complete by: As directed       Allergies as of 11/19/2022   No Known Allergies      Medication List     TAKE these medications    acetaminophen 325 MG tablet Commonly known as: TYLENOL Take 1-2 tablets (325-650 mg total) by mouth every 4 (four) hours as needed for mild pain.   aspirin EC 81 MG tablet Take 81 mg by mouth in the morning.   B-12 PO Take 1 tablet by mouth daily with lunch.   Centrum Silver tablet Take 1 tablet by mouth daily with lunch.   DOCUSATE SODIUM PO Take 1 capsule by mouth daily as needed (constipation).   ferrous sulfate 325 (65 FE) MG EC tablet Take 325 mg by mouth daily with lunch.   FISH OIL PO Take 1 capsule by mouth daily with lunch. When able to remember   gabapentin 300 MG capsule Commonly known as: NEURONTIN Take 2 capsules (600 mg total) by mouth 3 (three) times daily. What changed:  medication strength how much to take   ibuprofen 200 MG tablet Commonly known as: ADVIL Take 400 mg by mouth daily as needed for headache or mild pain.   LIDEX EX Apply 1 application  topically daily as needed (rash).   nicotine 21 mg/24hr patch Commonly known as: NICODERM CQ - dosed in mg/24 hours Place 21 mg onto the skin daily as needed (nicotine craving). What changed: Another medication with the same name was removed. Continue taking this medication, and follow the directions you see here.   sildenafil 100 MG tablet Commonly known as: VIAGRA Take 100 mg by mouth daily as needed for erectile dysfunction.        Follow-up Information     Loyola Mast, MD Follow up in 1 week(s).   Specialty: Family Medicine Contact information: [address] [place] [address] [phone]                 No Known Allergies  The results of significant diagnostics from this hospitalization (including imaging, microbiology, ancillary and laboratory) are listed below for reference.    Microbiology: Recent Results (from the past 240 hour(s))  Resp panel by RT-PCR (RSV, Flu A&B, Covid) Anterior Nasal Swab     Status: None   Collection Time: 11/15/22  9:10 AM   Specimen: Anterior Nasal Swab  Result Value Ref Range Status   SARS Coronavirus 2 by RT PCR NEGATIVE NEGATIVE Final    Comment: (NOTE) SARS-CoV-2 target nucleic acids are NOT DETECTED.  The SARS-CoV-2 RNA is generally detectable in upper respiratory specimens during the acute phase of infection. The lowest concentration of SARS-CoV-2 viral copies this assay can detect is 138 copies/mL. A negative result does not preclude SARS-Cov-2 infection and should not be used as the sole basis for treatment or other patient management decisions. A negative result may occur with  improper specimen collection/handling, submission of specimen other than nasopharyngeal swab, presence of viral  mutation(s) within the areas targeted by this assay, and inadequate number of viral copies(<138 copies/mL). A negative result must be combined with clinical observations, patient history, and epidemiological information. The expected result is Negative.  Fact Sheet for Patients:  BloggerCourse.comhttps://www.fda.gov/media/152166/download  Fact Sheet for Healthcare Providers:  SeriousBroker.ithttps://www.fda.gov/media/152162/download  This test is no t yet approved or cleared by the [place] FDA and  has been authorized for detection and/or diagnosis of SARS-CoV-2 by FDA under an Emergency Use Authorization (EUA). This EUA will remain  in effect (meaning this test can be used) for the duration of the COVID-19 declaration under Section 564(b)(1) of the Act, 21 U.S.C.section 360bbb-3(b)(1), unless the authorization is terminated  or revoked sooner.        Influenza A by PCR NEGATIVE NEGATIVE Final   Influenza B by PCR NEGATIVE NEGATIVE Final    Comment: (NOTE) The Xpert Xpress SARS-CoV-2/FLU/RSV plus assay is intended as an aid in the diagnosis of influenza from Nasopharyngeal swab specimens and should not be used as a sole basis for treatment. Nasal washings and aspirates are unacceptable for Xpert Xpress SARS-CoV-2/FLU/RSV testing.  Fact Sheet for Patients: BloggerCourse.comhttps://www.fda.gov/media/152166/download  Fact Sheet for Healthcare Providers: SeriousBroker.ithttps://www.fda.gov/media/152162/download  This test is not yet approved or cleared by the [place] FDA and has been authorized for detection and/or diagnosis of SARS-CoV-2 by FDA under an Emergency Use Authorization (EUA). This EUA will remain in effect (meaning this test can be used) for the duration of the COVID-19 declaration under Section 564(b)(1) of the Act, 21 U.S.C. section 360bbb-3(b)(1), unless the authorization is terminated or revoked.     Resp Syncytial Virus by PCR NEGATIVE NEGATIVE Final    Comment: (NOTE) Fact Sheet for Patients: BloggerCourse.comhttps://www.fda.gov/media/152166/download  Fact Sheet for Healthcare Providers: SeriousBroker.ithttps://www.fda.gov/media/152162/download  This test is not yet approved or cleared by the [place] FDA and has been authorized for detection and/or diagnosis of SARS-CoV-2 by FDA under an Emergency Use Authorization (EUA). This EUA will remain in effect (meaning this test can be used) for the duration of the COVID-19 declaration under Section 564(b)(1) of the Act, 21 U.S.C. section 360bbb-3(b)(1), unless the authorization is terminated or revoked.  Performed at [facility], [address] [place], [place] [address]     Procedures/Studies: Gerald LeitzIR Fluoro Guide CV Line Right  Result Date: 10/25/2022 INDICATION: Guillain-Barre syndrome, access for plasmapheresis EXAM: ULTRASOUND GUIDANCE FOR VASCULAR ACCESS RIGHT INTERNAL JUGULAR TEMPORARY PHERESIS  CATHETER Date:  10/25/2022 10/25/2022 5:35 pm Radiologist:  Judie PetitM. Ruel Favorsrevor Shick, MD Guidance:  Ultrasound fluoroscopic FLUOROSCOPY: 0 minutes 24 seconds (5.0 mGy). MEDICATIONS: 1% lidocaine local ANESTHESIA/SEDATION: None. CONTRAST:  None. COMPLICATIONS: None immediate. PROCEDURE: Informed consent was obtained from the patient following explanation of the procedure, risks, benefits and alternatives. The patient understands, agrees and consents for the procedure. All questions were addressed. A time out was performed. Maximal barrier sterile technique utilized including caps, mask, sterile gowns, sterile gloves, large sterile drape, hand hygiene, and ChloraPrep. Under sterile conditions and local anesthesia, right internal jugular micropuncture access performed. Images obtained for documentation of the patent right internal jugular vein. Guidewire advanced followed by the transitional dilator set. Amplatz guidewire inserted. Tract dilatation performed to insert a 20 cm temporary pheresis catheter. Tip SVC RA junction. Blood aspirated easily followed by saline and heparin flushes. External caps applied. Catheter secured with Prolene sutures and a sterile dressing. No immediate complication. Patient tolerated the procedure well. IMPRESSION: Successful ultrasound fluoroscopic right IJ temporary paresis catheter. Tip SVC RA junction. Ready for use.  Electronically Signed   By: Judie Petit.  Shick M.D.   On: 10/25/2022 18:04   IR US Guide Vasc Access Right  Result Date: 10/25/2022 INDICATION: Guillain-Barre syndrome, access for plasmapheresis EXAM: ULTRASOUND GUIDANCE FOR VASCULAR ACCESS RIGHT INTERNAL JUGULAR TEMPORARY PHERESIS CATHETER Date:  10/25/2022 10/25/2022 5:35 pm Radiologist:  Judie Petit. Ruel Favors, MD Guidance:  Ultrasound fluoroscopic FLUOROSCOPY: 0 minutes 24 seconds (5.0 mGy). MEDICATIONS: 1% lidocaine local ANESTHESIA/SEDATION: None. CONTRAST:  None. COMPLICATIONS: None immediate. PROCEDURE: Informed consent was obtained  from the patient following explanation of the procedure, risks, benefits and alternatives. The patient understands, agrees and consents for the procedure. All questions were addressed. A time out was performed. Maximal barrier sterile technique utilized including caps, mask, sterile gowns, sterile gloves, large sterile drape, hand hygiene, and ChloraPrep. Under sterile conditions and local anesthesia, right internal jugular micropuncture access performed. Images obtained for documentation of the patent right internal jugular vein. Guidewire advanced followed by the transitional dilator set. Amplatz guidewire inserted. Tract dilatation performed to insert a 20 cm temporary pheresis catheter. Tip SVC RA junction. Blood aspirated easily followed by saline and heparin flushes. External caps applied. Catheter secured with Prolene sutures and a sterile dressing. No immediate complication. Patient tolerated the procedure well. IMPRESSION: Successful ultrasound fluoroscopic right IJ temporary paresis catheter. Tip SVC RA junction. Ready for use. Electronically Signed   By: Judie Petit.  Shick M.D.   On: 10/25/2022 18:04   DG Chest 2 View  Result Date: 10/24/2022 CLINICAL DATA:  Short of breath EXAM: CHEST - 2 VIEW COMPARISON:  09/24/2022 FINDINGS: Frontal and lateral views of the chest demonstrate a stable cardiac silhouette. No airspace disease, effusion, or pneumothorax. No acute bony abnormalities. IMPRESSION: 1. No acute intrathoracic process. Electronically Signed   By: Sharlet Salina M.D.   On: 10/24/2022 17:15   NCV with EMG(electromyography)  Result Date: 10/24/2022 Levert Feinstein, MD     10/24/2022  2:48 PM     Full Name: Armando Gang Gender: Male MRN #: [id number] Date of Birth: [birth year]   Visit Date: 10/24/2022 13:32 Age: 43 Years Examining Physician: Levert Feinstein Referring Physician: Levert Feinstein Height: 5 feet 10 inch History: 72 year old male presenting with subacute onset progressive ascending paresthesia, gait  abnormality, autonomic dysfunction Summary of the test: nerve conduction study: Right sural, superficial peroneal, median, ulnar sensory responses were absent. Right radial sensory response showed moderately decreased snap amplitude, within normal range peak latency Right tibial, ulnar, and median motor responses all demonstrate significantly prolonged distal latency, temporal dispersion of the waveform, unreliable responses stimulating at proximal stimulation site, No F-wave latency was elicited stimulating of right tibialis nerve.  Right ulnar F-wave latency was severely prolonged Electromyography: Selected needle examinations were done at right lower, upper extremity muscles, right lumbar and cervical paraspinal muscles. There is evidence of increased insertional activity, complex and large motor unit potential noted at right distal neck and upper extremity muscles, there was no evidence of spontaneous activity. Right lumbosacral paraspinal muscles showed increased insertional activity, 1-2+ spontaneous activity, polyphasic motor unit potential. Conclusion: This is an abnormal study.  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 ------------------------------- Levert Feinstein M.D. Ph.D. [facility] [address] [place], [place] [address] Tel: [phone] Fax: [phone] Verbal informed consent was obtained from the patient, patient was informed of potential risk of procedure, including bruising, bleeding, hematoma formation, infection, muscle weakness, muscle pain, numbness, among others.     MNC   Nerve /  Sites Muscle Latency Ref. Amplitude Ref. Rel Amp Segments Distance Velocity Ref. Area   ms ms mV mV %  cm m/s m/s mVms R Median - APB    Wrist APB 22.9 ?4.4 0.1 ?4.0 100 Wrist - APB 7   0.2    Upper arm APB 30.2  1.2  1138 Upper arm - Wrist   ?49 10.1 R Ulnar - ADM    Wrist ADM 7.0 ?3.3 1.6  ?6.0 100 Wrist - ADM 7   4.8    B.Elbow ADM 10.4  0.8  50.3 B.Elbow - Wrist 16 47 ?49 1.4    A.Elbow ADM 12.8  2.5  305 A.Elbow - B.Elbow 13 55 ?49 12.9 R Tibial - AH    Ankle AH 14.8 ?5.8  ?4.0  Ankle - AH 9       Pop fossa AH      Pop fossa - Ankle   ?41          SNC   Nerve / Sites Rec. Site Peak Lat Ref.  Amp Ref. Segments Distance   ms ms V V  cm R Radial - Anatomical snuff box (Forearm)    Forearm Wrist 2.5 ?2.9 10 ?15 Forearm - Wrist 10 R Sural - Ankle (Calf)    Calf Ankle NR ?4.4 NR ?6 Calf - Ankle 14 R Superficial peroneal - Ankle    Lat leg Ankle NR ?4.4 NR ?6 Lat leg - Ankle 14 R Median - Orthodromic (Dig II, Mid palm)    Dig II Wrist NR ?3.4 NR ?10 Dig II - Wrist 13 R Ulnar - Orthodromic, (Dig V, Mid palm)    Dig V Wrist NR ?3.1 NR ?5 Dig V - Wrist 80             F  Wave   Nerve F Lat Ref.  ms ms R Ulnar - ADM 49.2 ?32.0 R Tibial - AH NR ?56.0       EMG Summary Table   Spontaneous MUAP Recruitment Muscle IA Fib PSW Fasc Other Amp Dur. Poly Pattern R. Tibialis anterior Increased None None None _______ Normal Increased 1+ Reduced R. Vastus lateralis Normal None None None _______ Normal Normal Normal Reduced R. Peroneus longus Normal None None None _______ Normal Normal Normal Reduced R. Tibialis posterior Normal None None None _______ Normal Normal Normal Reduced R. Gastrocnemius (Medial head) Normal None None None _______ Normal Normal Normal Reduced R. Lumbar paraspinals (low) Increased 1+ 1+ None _______ Normal Normal Normal Normal R. Lumbar paraspinals (mid) Increased 1+ 1+ None _______ Normal Normal Normal Normal R. First dorsal interosseous Normal None None None _______ Normal Normal Normal Reduced R. Pronator teres Normal None None None _______ Normal Normal Normal Reduced R. Biceps brachii Normal None None None _______ Normal Normal Normal Reduced R. Deltoid Normal None None None _______ Normal Normal Normal Reduced R. Cervical paraspinals Normal None None None _______ Normal Normal Normal  Reduced     Labs: BNP (last 3 results) No results for input(s): "BNP" in the last 8760 hours. Basic Metabolic Panel: Recent Labs  Lab 11/15/22 0751 11/15/22 2000 11/16/22 0521 11/17/22 0359  NA 136  --  137 137  K 4.2  --  3.8 4.1  CL 102  --  108 107  CO2 21*  --  21* 19*  GLUCOSE 117*  --  115* 100*  BUN 18  --  19 16  CREATININE 0.88  --  0.93 0.98  CALCIUM 9.3  --  8.3* 8.9  MG  --  2.2 2.0 2.1  PHOS  --  4.6 3.9  --    Liver Function Tests: Recent Labs  Lab 11/15/22 0751 11/16/22 0521  AST 20 18  ALT 27 24  ALKPHOS 37* 32*  BILITOT 0.5 0.8  PROT 6.7 6.1*  ALBUMIN 4.1 3.4*   No results for input(s): "LIPASE", "AMYLASE" in the last 168 hours. No results for input(s): "AMMONIA" in the last 168 hours. CBC: Recent Labs  Lab 11/15/22 0751 11/16/22 0521 11/17/22 0359  WBC 7.0 8.1 5.3  NEUTROABS 3.6  --   --   HGB 14.5 12.9* 13.7  HCT 43.3 37.6* 39.9  MCV 98.9 98.2 96.8  PLT 283 237 237   Cardiac Enzymes: Recent Labs  Lab 11/15/22 1900  CKTOTAL 69   BNP: Invalid input(s): "POCBNP" CBG: No results for input(s): "GLUCAP" in the last 168 hours. D-Dimer No results for input(s): "DDIMER" in the last 72 hours. Hgb A1c No results for input(s): "HGBA1C" in the last 72 hours. Lipid Profile No results for input(s): "CHOL", "HDL", "LDLCALC", "TRIG", "CHOLHDL", "LDLDIRECT" in the last 72 hours. Thyroid function studies No results for input(s): "TSH", "T4TOTAL", "T3FREE", "THYROIDAB" in the last 72 hours.  Invalid input(s): "FREET3" Anemia work up No results for input(s): "VITAMINB12", "FOLATE", "FERRITIN", "TIBC", "IRON", "RETICCTPCT" in the last 72 hours. Urinalysis    Component Value Date/Time   COLORURINE YELLOW 11/15/2022 2000   APPEARANCEUR CLEAR 11/15/2022 2000   LABSPEC 1.014 11/15/2022 2000   PHURINE 6.0 11/15/2022 2000   GLUCOSEU NEGATIVE 11/15/2022 2000   GLUCOSEU NEGATIVE 09/02/2020 1112   HGBUR NEGATIVE 11/15/2022 2000   BILIRUBINUR  NEGATIVE 11/15/2022 2000   KETONESUR NEGATIVE 11/15/2022 2000   PROTEINUR NEGATIVE 11/15/2022 2000   UROBILINOGEN 0.2 09/02/2020 1112   NITRITE NEGATIVE 11/15/2022 2000   LEUKOCYTESUR NEGATIVE 11/15/2022 2000   Sepsis Labs Recent Labs  Lab 11/15/22 0751 11/16/22 0521 11/17/22 0359  WBC 7.0 8.1 5.3   Microbiology Recent Results (from the past 240 hour(s))  Resp panel by RT-PCR (RSV, Flu A&B, Covid) Anterior Nasal Swab     Status: None   Collection Time: 11/15/22  9:10 AM   Specimen: Anterior Nasal Swab  Result Value Ref Range Status   SARS Coronavirus 2 by RT PCR NEGATIVE NEGATIVE Final    Comment: (NOTE) SARS-CoV-2 target nucleic acids are NOT DETECTED.  The SARS-CoV-2 RNA is generally detectable in upper respiratory specimens during the acute phase of infection. The lowest concentration of SARS-CoV-2 viral copies this assay can detect is 138 copies/mL. A negative result does not preclude SARS-Cov-2 infection and should not be used as the sole basis for treatment or other patient management decisions. A negative result may occur with  improper specimen collection/handling, submission of specimen other than nasopharyngeal swab, presence of viral mutation(s) within the areas targeted by this assay, and inadequate number of viral copies(<138 copies/mL). A negative result must be combined with clinical observations, patient history, and epidemiological information. The expected result is Negative.  Fact Sheet for Patients:  BloggerCourse.comhttps://www.fda.gov/media/152166/download  Fact Sheet for Healthcare Providers:  SeriousBroker.ithttps://www.fda.gov/media/152162/download  This test is no t yet approved or cleared by the [place] FDA and  has been authorized for detection and/or diagnosis of SARS-CoV-2 by FDA under an Emergency Use Authorization (EUA). This EUA will remain  in effect (meaning this test can be used) for the duration of the COVID-19 declaration under Section 564(b)(1) of the Act,  21 U.S.C.section 360bbb-3(b)(1), unless the authorization  is terminated  or revoked sooner.       Influenza A by PCR NEGATIVE NEGATIVE Final   Influenza B by PCR NEGATIVE NEGATIVE Final    Comment: (NOTE) The Xpert Xpress SARS-CoV-2/FLU/RSV plus assay is intended as an aid in the diagnosis of influenza from Nasopharyngeal swab specimens and should not be used as a sole basis for treatment. Nasal washings and aspirates are unacceptable for Xpert Xpress SARS-CoV-2/FLU/RSV testing.  Fact Sheet for Patients: BloggerCourse.com  Fact Sheet for Healthcare Providers: SeriousBroker.it  This test is not yet approved or cleared by the [place] FDA and has been authorized for detection and/or diagnosis of SARS-CoV-2 by FDA under an Emergency Use Authorization (EUA). This EUA will remain in effect (meaning this test can be used) for the duration of the COVID-19 declaration under Section 564(b)(1) of the Act, 21 U.S.C. section 360bbb-3(b)(1), unless the authorization is terminated or revoked.     Resp Syncytial Virus by PCR NEGATIVE NEGATIVE Final    Comment: (NOTE) Fact Sheet for Patients: BloggerCourse.com  Fact Sheet for Healthcare Providers: SeriousBroker.it  This test is not yet approved or cleared by the [place] FDA and has been authorized for detection and/or diagnosis of SARS-CoV-2 by FDA under an Emergency Use Authorization (EUA). This EUA will remain in effect (meaning this test can be used) for the duration of the COVID-19 declaration under Section 564(b)(1) of the Act, 21 U.S.C. section 360bbb-3(b)(1), unless the authorization is terminated or revoked.  Performed at [facility], [address], [place], [place] [address]      Time coordinating discharge: 25 minutes  SIGNED: Lanae Boast, MD  [facility] 11/19/2022, 11:31 AM  If 7PM-7AM, please  contact night-coverage [email or web address]

## 2022-11-20 ENCOUNTER — Telehealth: Payer: Self-pay

## 2022-11-20 ENCOUNTER — Telehealth: Payer: Self-pay | Admitting: Family Medicine

## 2022-11-20 DIAGNOSIS — M6281 Muscle weakness (generalized): Secondary | ICD-10-CM

## 2022-11-20 LAB — MISC LABCORP TEST (SEND OUT): Labcorp test code: 140120

## 2022-11-20 NOTE — Telephone Encounter (Signed)
Spoke to patient, he states that he needs a referral to [facility] for Ezequiel Ganser specialist for a second opinion.  He does have an appointment with [facility] tomorrow and will go to that appointment.   Please review and advise.  Thanks. Dm/cma

## 2022-11-20 NOTE — Telephone Encounter (Signed)
Patient notified VIA phone. Dm/cma  

## 2022-11-20 NOTE — Patient Outreach (Signed)
  Care Coordination TOC Note Transition Care Management Follow-up Telephone Call Date of discharge and from where: [facility] How have you been since you were released from the hospital? "I am not doing so good.  I just got home from the hospital yesterday" Any questions or concerns? No  Items Reviewed: Did the pt receive and understand the discharge instructions provided? Yes  Medications obtained and verified? Yes  Other? No  Any new allergies since your discharge? No  Dietary orders reviewed? Yes Do you have support at home? Yes   Home Care and Equipment/Supplies: Were home health services ordered? no If so, what is the name of the agency? N/A  Has the agency set up a time to come to the patient's home? not applicable Were any new equipment or medical supplies ordered?  No What is the name of the medical supply agency? N/a Were you able to get the supplies/equipment? not applicable Do you have any questions related to the use of the equipment or supplies? No  Functional Questionnaire: (I = Independent and D = Dependent) ADLs: Needs assistance  Bathing/Dressing- needs assistance  Meal Prep- D  Eating- I  Maintaining continence- I  Transferring/Ambulation- supervision  Managing Meds- I  Follow up appointments reviewed:  PCP Hospital f/u appt confirmed? No   Specialist Hospital f/u appt confirmed? Yes  Scheduled to see Dr. Krista Blue on 11/21/22 @ 11:30. Are transportation arrangements needed? No  If their condition worsens, is the pt aware to call PCP or go to the Emergency Dept.? Yes Was the patient provided with contact information for the PCP's office or ED? Yes Was to pt encouraged to call back with questions or concerns? Yes  SDOH assessments and interventions completed:   Yes SDOH Interventions Today    Flowsheet Row Most Recent Value  SDOH Interventions   Housing Interventions Intervention Not Indicated       Care Coordination Interventions:  PCP  follow up appointment requested   Encounter Outcome:  Pt. Visit Completed

## 2022-11-20 NOTE — Telephone Encounter (Signed)
Will see pt on Nov 21, 2022

## 2022-11-20 NOTE — Telephone Encounter (Signed)
Pt need for inquire notes from the neurology in [place]  to the [facility] neurologist. Pt also stated that they need a referral for [facility] specialist. Please give the pt a call

## 2022-11-21 ENCOUNTER — Ambulatory Visit (INDEPENDENT_AMBULATORY_CARE_PROVIDER_SITE_OTHER): Payer: Medicare PPO | Admitting: Family Medicine

## 2022-11-21 ENCOUNTER — Ambulatory Visit: Payer: Medicare PPO | Admitting: Rehabilitation

## 2022-11-21 ENCOUNTER — Ambulatory Visit: Payer: Medicare PPO | Admitting: Neurology

## 2022-11-21 ENCOUNTER — Encounter: Payer: Self-pay | Admitting: Rehabilitation

## 2022-11-21 ENCOUNTER — Encounter: Payer: Self-pay | Admitting: Neurology

## 2022-11-21 ENCOUNTER — Encounter: Payer: Self-pay | Admitting: Family Medicine

## 2022-11-21 VITALS — BP 120/78 | HR 78 | Temp 97.8°F | Ht 70.0 in | Wt 194.0 lb

## 2022-11-21 VITALS — BP 119/70 | HR 75 | Ht 70.0 in | Wt 194.0 lb

## 2022-11-21 DIAGNOSIS — E538 Deficiency of other specified B group vitamins: Secondary | ICD-10-CM

## 2022-11-21 DIAGNOSIS — R2689 Other abnormalities of gait and mobility: Secondary | ICD-10-CM | POA: Diagnosis not present

## 2022-11-21 DIAGNOSIS — R2681 Unsteadiness on feet: Secondary | ICD-10-CM

## 2022-11-21 DIAGNOSIS — R269 Unspecified abnormalities of gait and mobility: Secondary | ICD-10-CM

## 2022-11-21 DIAGNOSIS — G610 Guillain-Barre syndrome: Secondary | ICD-10-CM

## 2022-11-21 DIAGNOSIS — M6281 Muscle weakness (generalized): Secondary | ICD-10-CM | POA: Diagnosis not present

## 2022-11-21 DIAGNOSIS — R278 Other lack of coordination: Secondary | ICD-10-CM | POA: Diagnosis not present

## 2022-11-21 DIAGNOSIS — R208 Other disturbances of skin sensation: Secondary | ICD-10-CM | POA: Diagnosis not present

## 2022-11-21 LAB — MULTIPLE MYELOMA PANEL, SERUM
Albumin SerPl Elph-Mcnc: 3.9 g/dL (ref 2.9–4.4)
Albumin/Glob SerPl: 1.6 (ref 0.7–1.7)
Alpha 1: 0.2 g/dL (ref 0.0–0.4)
Alpha2 Glob SerPl Elph-Mcnc: 0.9 g/dL (ref 0.4–1.0)
B-Globulin SerPl Elph-Mcnc: 0.9 g/dL (ref 0.7–1.3)
Gamma Glob SerPl Elph-Mcnc: 0.5 g/dL (ref 0.4–1.8)
Globulin, Total: 2.5 g/dL (ref 2.2–3.9)
IgA: 185 mg/dL (ref 61–437)
IgG (Immunoglobin G), Serum: 491 mg/dL — ABNORMAL LOW (ref 603–1613)
IgM (Immunoglobulin M), Srm: 86 mg/dL (ref 15–143)
Total Protein ELP: 6.4 g/dL (ref 6.0–8.5)

## 2022-11-21 LAB — T3: T3, Total: 114 ng/dL (ref 71–180)

## 2022-11-21 NOTE — Progress Notes (Signed)
[facility] [address] [place] [address] Dept: [phone] Dept Fax: [phone]  Office Visit  Subjective:    Patient ID: Joe Greene, male    DOB: 1951/07/20, 72 y.o..   MRN: [id number]  Chief Complaint  Patient presents with   Hospitalization [facility] f/u for [facility].      History of Present Illness:  Patient is in today for follow-up from recent hospitalization. 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 This condition started for him initially on Nov. 9th, following a URI about 2 weeks earlier. He underwent IVIG treatment from Nov. 14-18th. His symptoms were worsening in Dec., when he was admitted for plasma exchange. At his most recent hospitalization, he was treated with another round of IVIG.  Joe Greene notes that he continues to have significant weakness in the lower legs. He is using a walker to ambulate. He was seen this morning by Dr. Krista Blue. She plans to proceed with another 4-dose infusion of IVIG at the end of the month. He continue to work with physical therapy and occupational therapy regarding strengthening and ADLs.  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) 07/26/2021   Internal hemorrhoids 01/23/2021    Diverticula of colon 01/23/2021   Eczema 01/23/2021   Actinic keratoses 01/23/2021   Tobacco use 09/02/2020   Past Surgical History:  Procedure Laterality Date   FOOT SURGERY Right    HERNIA REPAIR Right    IR FLUORO GUIDE CV LINE RIGHT  10/25/2022   IR US GUIDE VASC ACCESS RIGHT  10/25/2022   KNEE ARTHROSCOPY Left    WISDOM TOOTH EXTRACTION     Family History  Problem Relation Age of Onset   Stroke Mother 41   AAA (abdominal aortic aneurysm) Mother    Cancer Father        Prostate   Colon cancer Neg Hx    Colon polyps Neg Hx    Esophageal cancer Neg Hx    Rectal cancer Neg Hx    Stomach cancer Neg Hx    Outpatient Medications Prior to Visit  Medication Sig Dispense Refill   aspirin EC 81 MG tablet Take 81 mg by mouth in the morning.     Cyanocobalamin (B-12 PO) Take 1 tablet by mouth daily with lunch.     DOCUSATE SODIUM PO Take 1 capsule by mouth daily as needed (constipation).     ferrous sulfate 325 (65 FE) MG EC tablet Take 325 mg by mouth daily with lunch.     Fluocinonide (LIDEX EX) Apply 1 application  topically daily as needed (rash).     gabapentin (NEURONTIN) 300 MG capsule Take 2 capsules (600 mg total) by mouth 3 (three) times daily. 180 capsule 0   ibuprofen (ADVIL) 200 MG tablet Take 400 mg by mouth daily as needed for headache or mild pain.  Multiple Vitamins-Minerals (CENTRUM SILVER) tablet Take 1 tablet by mouth daily with lunch.     nicotine (NICODERM CQ - DOSED IN MG/24 HOURS) 21 mg/24hr patch Place 21 mg onto the skin daily as needed (nicotine craving).     Omega-3 Fatty Acids (FISH OIL PO) Take 1 capsule by mouth daily with lunch. When able to remember     sildenafil (VIAGRA) 100 MG tablet Take 100 mg by mouth daily as needed for erectile dysfunction.     acetaminophen (TYLENOL) 325 MG tablet Take 1-2 tablets (325-650 mg total) by mouth every 4 (four) hours as needed for mild pain. (Patient not taking: Reported on 11/21/2022)     No  facility-administered medications prior to visit.   No Known Allergies    Objective:   Today's Vitals   11/21/22 1339  BP: 120/78  Pulse: 78  Temp: 97.8 F (36.6 C)  TempSrc: Temporal  SpO2: 96%  Weight: 194 lb (88 kg)  Height: 5\' 10"  (1.778 m)   Body mass index is 27.84 kg/m.   General: Well developed, well nourished. No acute distress. Psych: Alert and oriented. Normal mood and affect.  Health Maintenance Due  Topic Date Due   Hepatitis C Screening  Never done   Medicare Annual Wellness (AWV)  11/14/2022     Assessment & Plan:   1. 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[place]) I reviewed recent discharge summary and neurology consult notes. The discharge physician requested we follow-up on labs. Joe Greene had contacted me yesterday requesting a referral to [facility] for a 2nd opinion regarding therapy options. I placed the referral for him, agreeing that they may be able to offer newer approaches for managing his GBS. He and his wife have considerable concerns for what his prognosis is. I reviewed with them regarding 80% probability of significant improvement/full recovery at 1 year. He has appropriate assistive devices at home. He will continue to work with PT/OT.  - CBC - Comprehensive metabolic panel  2. B12 deficiency Joe Greene had been noted to have a mildly low B12 level in Nov. I will reassess how this is doing and determine if he needs further Vit. B12 replacement.  - CBC - Vitamin B12  Return in about 3 months (around 02/20/2023) for Reassessment.   Haydee Salter, MD

## 2022-11-21 NOTE — Progress Notes (Signed)
Chief Complaint  Patient presents with   Follow-up    Rm 14 with wife here for f/u on IVIG and next steps.       ASSESSMENT AND PLAN  Joe Greene is a 72 y.o. male   Guillain-Barr syndrome  Started since early November 2023 following upper respiratory infection, loss of taste 2 weeks prior to the symptom onset  mild to moderate improvement following IVIG from November 14-18 2023, began to have worsening symptoms again since early December 2023, hospital admission again on Dec 14 for plasma Change, did help his symptoms, following that, at his best, he can ambulate without assistance, He began to have relapse hospital admission again on January 4th 2024, IVIG 2 g/kg completed on January 8, Remains symptomatic, areflexia, most distal upper and lower extremity bilateral hip flexion weakness, gait abnormality, positive Romberg signs, Continue IVIG as outpatient, 2 g/kg loading dose on the week of December 10, 2022, then 1 g/kg every 3 weeks   Return To Clinic With in  6 weeks  DIAGNOSTIC DATA (LABS, IMAGING, TESTING) - I reviewed patient records, labs, notes, testing and imaging myself where available.  MEDICAL HISTORY:  Joe Greene, is a 72 year old male seen in request by his primary care physician Dr. 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 PMHX HTN Smoker, 2ppd, quit since Sep 23 2022.  Patient has been active all his life, began to noticed toes and fingertips numbness tingling on September 17, 2022, prior to that, he has suffered upper respiratory infection for 2 weeks, cough, loss of taste,  By November 10, he noticed progressive numbness upper and lower extremity, and also developed gait abnormality,  Symptoms continue to progressively getting worse, leading to emergency room presentation September 24, 2022, also had intractable  headache,  He was admitted with diagnosis of probable Guillain-Barr, lumbar puncture September 25, 2022 showed wbc, 4, RBC 0,  TP 89, Glucose 80,   He had 5 days of IVIG, began to notice improvement shortly afterwards, continue to make progress, to the point he can ambulate without assistance at home for few days, but since early December, he noticed regression, increased numbness, gait abnormality,   Update October 24, 2022: Patient returns for electrodiagnostic study today, which confirmed acute demyelinating polyradiculoneuropathy, there was no evidence of significant axonal loss He had symptom onset early November, received IVIG from November 14 for 5 consecutive days, he began to notice an after second and third dose, the benefit lasted about 2 weeks, at his best, he could take few steps at home without assistant,  Since beginning of December 2023, 2 weeks after IVIG infusion, he began to notice worsening bilateral lower extremity ascending numbness, weakness, now could barely take a step, sitting in wheelchair most of the time, also frequent shortness of breath episode, sometimes waking up in the middle of the night  Also complains of difficulty emptying his bladder, urinary frequency, baseline elevated heart rate, consistent with autonomic involvement  We have attempted outpatient IVIG infusion, but due to insurance and preauthorization limitations, it needed few weeks to complete the process.  With his worsening symptoms, especially autonomic involvement, shortness of breath, continue progress of symptoms, per patient even worse than prior to treatment level in the middle of November, will refer him to hospital for either repeat IVIG or even consider plasma exchange  UPDATE Nov 21 2022: He was  admitted to hospital following his electrodiagnostic study because of his complaints of worsening ascending paresthesia, gait abnormality  Central line placed on 12/14 he was started on plasma  exchange, 5 rounds, every other day, with improvement in symptoms.  He completed  NIF-32 and vital capacity 2.7 L.  Then was discharged to inpatient rehabilitation on November 02, 2022, which continues improvement, at his best, he can ambulate without assistance  Shortly after he was discharged home on November 10, 2022, he noticed regression, hospital admission again on January 4th, IVIG 2 g/kg Completed on January 8th 2023, this round, he only noticed mild improvement  Discharge neurological examination showed normal upper extremity strength, mild distal upper extremity, hip flexion, distal leg weakness,  Patient complains of continued lower extremity paresthesia, weakness, gait abnormality, fingertips paresthesia, loss of dexterity of his fingers  He denies swallowing difficulty, breathing difficulty, no significant autonomic symptoms  PHYSICAL EXAM:   Vitals:   11/21/22 1103  BP: 119/70  Pulse: 75  Weight: 194 lb (88 kg)  Height: 5\' 10"  (1.778 m)   Body mass index is 27.84 kg/m.  PHYSICAL EXAMNIATION:  Gen: NAD, conversant, well nourised, well groomed                     Cardiovascular: Regular rate rhythm, no peripheral edema, warm, nontender. Eyes: Conjunctivae clear without exudates or hemorrhage Neck: Supple, no carotid bruits. Pulmonary: Clear to auscultation bilaterally   NEUROLOGICAL EXAM:  MENTAL STATUS: Speech/cognition: Awake, alert, oriented to history taking and casual conversation, normal breathing CRANIAL NERVES: CN II: Visual fields are full to confrontation. Pupils are round equal and briskly reactive to light. CN III, IV, VI: extraocular movement are normal. No ptosis. CN V: Facial sensation is intact to light touch CN VII: Face is symmetric with normal eye closure  CN VIII: Hearing is normal to causal conversation. CN IX, X: Phonation is normal. CN XI: Head turning and shoulder shrug are intact  MOTOR: Upper extremity proximal muscle strength is  normal, mild finger abduction weakness, mild bilateral hip flexion, bilateral ankle dorsiflexion, toe flexion/extension weakness  REFLEXES: Areflexia  SENSORY:  Length-dependent decreased light touch, pinprick sensation to above ankle level, absent vibratory sensation to knee level, decreased toe proprioception  Absent finger vibratory sensation, preserved finger proprioception, decreased pinprick to wrist level  COORDINATION: There is no trunk or limb dysmetria noted.  GAIT/STANCE: Need push-up to get up from sitting position, wide-based, cautious gait, REVIEW OF SYSTEMS:  Full 14 system review of systems performed and notable only for as above All other review of systems were negative.   ALLERGIES: No Known Allergies  HOME MEDICATIONS: Current Outpatient Medications  Medication Sig Dispense Refill   acetaminophen (TYLENOL) 325 MG tablet Take 1-2 tablets (325-650 mg total) by mouth every 4 (four) hours as needed for mild pain. (Patient not taking: Reported on 11/15/2022)     aspirin EC 81 MG tablet Take 81 mg by mouth in the morning.     Cyanocobalamin (B-12 PO) Take 1 tablet by mouth daily with lunch.     DOCUSATE SODIUM PO Take 1 capsule by mouth daily as needed (constipation).     ferrous sulfate 325 (65 FE) MG EC tablet Take 325 mg by mouth daily with lunch.     Fluocinonide (LIDEX EX) Apply 1 application  topically daily as needed (rash).     gabapentin (NEURONTIN) 300 MG capsule Take 2 capsules (600 mg total) by mouth 3 (three) times daily. 180 capsule  0   ibuprofen (ADVIL) 200 MG tablet Take 400 mg by mouth daily as needed for headache or mild pain.     Multiple Vitamins-Minerals (CENTRUM SILVER) tablet Take 1 tablet by mouth daily with lunch.     nicotine (NICODERM CQ - DOSED IN MG/24 HOURS) 21 mg/24hr patch Place 21 mg onto the skin daily as needed (nicotine craving).     Omega-3 Fatty Acids (FISH OIL PO) Take 1 capsule by mouth daily with lunch. When able to remember      sildenafil (VIAGRA) 100 MG tablet Take 100 mg by mouth daily as needed for erectile dysfunction.     No current facility-administered medications for this visit.    PAST MEDICAL HISTORY: Past Medical History:  Diagnosis Date   AAA (abdominal aortic aneurysm) (HCC)    Cataract    forming     PAST SURGICAL HISTORY: Past Surgical History:  Procedure Laterality Date   FOOT SURGERY Right    HERNIA REPAIR Right    IR FLUORO GUIDE CV LINE RIGHT  10/25/2022   IR US GUIDE VASC ACCESS RIGHT  10/25/2022   KNEE ARTHROSCOPY Left    WISDOM TOOTH EXTRACTION      FAMILY HISTORY: Family History  Problem Relation Age of Onset   Stroke Mother 36   AAA (abdominal aortic aneurysm) Mother    Cancer Father        Prostate   Colon cancer Neg Hx    Colon polyps Neg Hx    Esophageal cancer Neg Hx    Rectal cancer Neg Hx    Stomach cancer Neg Hx     SOCIAL HISTORY: Social History   Socioeconomic History   Marital status: Married    Spouse name: Clements Toro   Number of children: 2   Years of education: Not on file   Highest education level: Not on file  Occupational History   Occupation: Retired    Comment: Scientist, research (medical)- [employer]  Tobacco Use   Smoking status: Former    Packs/day: 2.00    Years: 50.00    Total pack years: 100.00    Types: Cigarettes    Quit date: 09/18/2022    Years since quitting: 0.1   Smokeless tobacco: Never  Vaping Use   Vaping Use: Never used  Substance and Sexual Activity   Alcohol use: Yes    Alcohol/week: 3.0 standard drinks of alcohol    Types: 3 Standard drinks or equivalent per week    Comment: 2 or 3 drinks per week   Drug use: Never   Sexual activity: Yes  Other Topics Concern   Not on file  Social History Narrative   Not on file   Social Determinants of Health   Financial Resource Strain: Low Risk  (11/14/2021)   Overall Financial Resource Strain (CARDIA)    Difficulty of Paying Living Expenses: Not hard at all  Food Insecurity:  No Food Insecurity (11/16/2022)   Hunger Vital Sign    Worried About Running Out of Food in the Last Year: Never true    Ran Out of Food in the Last Year: Never true  Transportation Needs: No Transportation Needs (11/16/2022)   PRAPARE - Administrator, Civil Service (Medical): No    Lack of Transportation (Non-Medical): No  Physical Activity: Sufficiently Active (11/14/2021)   Exercise Vital Sign    Days of Exercise per Week: 6 days    Minutes of Exercise per Session: 60 min  Stress: No  Stress Concern Present (11/14/2021)   [place]    Feeling of Stress : Not at all  Social Connections: Moderately Integrated (11/14/2021)   Social Connection and Isolation Panel [NHANES]    Frequency of Communication with Friends and Family: Twice a week    Frequency of Social Gatherings with Friends and Family: Twice a week    Attends Religious Services: More than 4 times per year    Active Member of [employer] or Organizations: No    Attends Archivist Meetings: Never    Marital Status: Married  Human resources officer Violence: Not At Risk (11/18/2022)   Humiliation, Afraid, Rape, and Kick questionnaire    Fear of Current or Ex-Partner: No    Emotionally Abused: No    Physically Abused: No    Sexually Abused: No      Marcial Pacas, M.D. Ph.D.  [facility] [address] [place], [place] [address] Ph: [phone] Fax: [phone]  CC:  Haydee Salter, MD [place],  [place] [id number]  Haydee Salter, MD

## 2022-11-21 NOTE — Progress Notes (Deleted)
[facility] [address] [place] [address] Dept: [phone] Dept Fax: [phone]  Chronic Care Office Visit  Subjective:    Patient ID: Joe Greene, male    DOB: 12/29/1950, 72 y.o..   MRN: [id number]  Chief Complaint  Patient presents with   Hospitalization [facility] f/u for [facility].      History of Present Illness:  Patient is in today for reassessment of chronic medical issues.  Past Medical History: Patient Active Problem List   Diagnosis Date Noted   Constipation 11/15/2022   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[place]) 11/02/2022   Acute sensory neuropathy 10/25/2022   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[place]) 10/18/2022   B12 deficiency 10/18/2022   Gait abnormality 10/18/2022   Pre-diabetes 09/25/2022   AAA (abdominal aortic aneurysm) ([place]) 09/24/2022   Essential hypertension 09/24/2022   Iron deficiency 04/04/2022   Hypertriglyceridemia 04/03/2022   Coronary artery calcification 01/18/2022   Candidal intertrigo    Emphysema of lung ([place]) 07/26/2021   Aortic atherosclerosis ([place]) 07/26/2021   Internal hemorrhoids 01/23/2021   Diverticula of colon 01/23/2021   Eczema 01/23/2021   Actinic keratoses 01/23/2021   Tobacco use 09/02/2020    Past Surgical History:  Procedure Laterality Date   FOOT SURGERY Right    HERNIA REPAIR Right    IR FLUORO GUIDE CV LINE RIGHT  10/25/2022   IR US GUIDE VASC ACCESS RIGHT  10/25/2022   KNEE ARTHROSCOPY Left    WISDOM TOOTH EXTRACTION      Family History  Problem Relation Age of Onset   Stroke Mother 57   AAA (abdominal aortic aneurysm) Mother    Cancer Father        Prostate   Colon cancer Neg Hx    Colon polyps Neg Hx    Esophageal cancer Neg Hx    Rectal cancer Neg Hx    Stomach cancer Neg Hx     Outpatient Medications Prior to Visit  Medication Sig Dispense Refill   aspirin EC 81 MG tablet Take 81 mg by mouth in the  morning.     Cyanocobalamin (B-12 PO) Take 1 tablet by mouth daily with lunch.     DOCUSATE SODIUM PO Take 1 capsule by mouth daily as needed (constipation).     ferrous sulfate 325 (65 FE) MG EC tablet Take 325 mg by mouth daily with lunch.     Fluocinonide (LIDEX EX) Apply 1 application  topically daily as needed (rash).     gabapentin (NEURONTIN) 300 MG capsule Take 2 capsules (600 mg total) by mouth 3 (three) times daily. 180 capsule 0   ibuprofen (ADVIL) 200 MG tablet Take 400 mg by mouth daily as needed for headache or mild pain.     Multiple Vitamins-Minerals (CENTRUM SILVER) tablet Take 1 tablet by mouth daily with lunch.     nicotine (NICODERM CQ - DOSED IN MG/24 HOURS) 21 mg/24hr patch Place 21 mg onto the skin daily as needed (nicotine craving).     Omega-3 Fatty Acids (FISH OIL PO) Take 1 capsule by mouth daily with lunch. When able to remember     sildenafil (VIAGRA) 100 MG tablet Take 100 mg by mouth daily as needed for erectile dysfunction.     acetaminophen (TYLENOL) 325 MG tablet Take 1-2 tablets (325-650 mg total) by mouth every 4 (four) hours as needed for mild pain. (Patient not taking: Reported on 11/21/2022)     No facility-administered medications prior  to visit.    No Known Allergies    Objective:   Today's Vitals   11/21/22 1339  BP: 120/78  Pulse: 78  Temp: 97.8 F (36.6 C)  TempSrc: Temporal  SpO2: 96%  Weight: 194 lb (88 kg)  Height: 5\' 10"  (1.778 m)   Body mass index is 27.84 kg/m.   General: Well developed, well nourished. No acute distress. HEENT: Normocephalic, non-traumatic. External ears normal. EAC and TMs normal bilaterally. PERRL, EOMI. Conjunctiva clear. Fundiscopic exam shows normal disc and vasculature. Nose   clear without congestion or rhinorrhea. Mucous membranes moist. Oropharynx clear. Good dentition. Neck: Supple. No lymphadenopathy. No thyromegaly. Lungs: Clear to auscultation bilaterally. No wheezing, rales or rhonchi. CV: RRR  without murmurs or rubs. Pulses 2+ bilaterally. Abdomen: Soft, non-tender. Bowel sounds positive, normal pitch and frequency. No hepatosplenomegaly. No rebound or guarding. Back: Straight. No CVA tenderness bilaterally. Extremities: Full ROM. No joint swelling or tenderness. No edema noted. Skin: Warm and dry. No rashes. Neuro: CN II-XII intact. Normal sensation and DTR bilaterally. Psych: Alert and oriented. Normal mood and affect.  Health Maintenance Due  Topic Date Due   Hepatitis C Screening  Never done   Medicare Annual Wellness (AWV)  11/14/2022    Lab Results {Labs (Optional):23779}    Assessment & Plan:   1. 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HCC) *** - CBC - Comprehensive metabolic panel  2. B12 deficiency *** - CBC - Vitamin B12   Return in about 3 months (around 02/20/2023) for Reassessment.   Haydee Salter, MD

## 2022-11-21 NOTE — Therapy (Signed)
OUTPATIENT PHYSICAL THERAPY NEURO THERAPY   Patient Name: Joe Greene MRN: [id number] DOB:Aug 11, 1951, 72 y.o., male Today's Date: 11/21/2022   PCP: Arlester Marker, MD REFERRING PROVIDER: Marlowe Shores, PA-C  END OF SESSION:  PT End of Session - 11/21/22 0934     Visit Number 2    Number of Visits 25    Date for PT Re-Evaluation 01/13/23    Authorization Type [employer] [place]    Progress Note Due on Visit 10    PT Start Time 0930    PT Stop Time 9767    PT Time Calculation (min) 45 min    Activity Tolerance Patient tolerated treatment well;Patient limited by fatigue    Behavior During Therapy [facility] for tasks assessed/performed             Past Medical History:  Diagnosis Date   AAA (abdominal aortic aneurysm) ([place])    Cataract    forming    Past Surgical History:  Procedure Laterality Date   FOOT SURGERY Right    HERNIA REPAIR Right    IR FLUORO GUIDE CV LINE RIGHT  10/25/2022   IR US GUIDE VASC ACCESS RIGHT  10/25/2022   KNEE ARTHROSCOPY Left    WISDOM TOOTH EXTRACTION     Patient Active Problem List   Diagnosis Date Noted   Constipation 11/15/2022   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[place]) 11/02/2022   Acute sensory neuropathy 10/25/2022   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[place]) 10/18/2022   B12 deficiency 10/18/2022   Gait abnormality 10/18/2022   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 10/10/2022   Pre-diabetes 09/25/2022   AAA (abdominal aortic aneurysm) ([place]) 09/24/2022   Essential hypertension 09/24/2022   Iron deficiency 04/04/2022   Hypertriglyceridemia 04/03/2022   Coronary artery calcification 01/18/2022   Candidal intertrigo    Emphysema of lung ([place]) 07/26/2021   Aortic atherosclerosis ([place]) 07/26/2021   Internal hemorrhoids 01/23/2021   Diverticula of colon 01/23/2021   Eczema 01/23/2021   Actinic keratoses 01/23/2021   Tobacco use 09/02/2020    ONSET DATE: 11/06/2022 (referral date)  REFERRING DIAG: AIDP,  GBS  THERAPY DIAG:  Muscle weakness (generalized)  Unsteadiness on feet  Other abnormalities of gait and mobility  Rationale for Evaluation and Treatment: Rehabilitation  SUBJECTIVE:                                                                                                                                                                                             SUBJECTIVE STATEMENT: Pt reports he feels he has taken another step back even from last time I saw him.  Note that he was hospitalized  with another round of IVIG given over 5 days.  Returned home on Monday.  Pt accompanied by: self   PERTINENT HISTORY: Patient is in today for follow-up from his recent hospitalization. Joe Greene was admitted at [facility] from 11/13-11/20/2023. He presented with right leg weakness and ascending numbness of his fingers and toes. He had extensive work up. 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 He was treated with a course of IVIG. He underwent some initial physical therapy. Due to some logistical challenges, the decision was made to discharge him to home, rather than move to inpatient rehab.   Since returning home, Joe Greene notes he had fluctuating weakness in the legs. Therefore returned to MD and was hospitalized with new diagnosis of AIDP on 11/02/22, went to inpatient rehab and then returned home 11/10/22.   Other issues were identified during his hospital stay, including some hyperglycemia with an A1c of 6.5%, a mildly high TSH with a normal T4, elevated blood pressures, and some dyspnea issues. His blood pressure was high enough that he was started on losartan 25 mg daily. He has been trying to quit smoking now in light of his current issues. Additionally, the hospital noted his infrarenal AAA.  PAIN:  Are you having pain? Yes: NPRS scale: 5/10 Pain location: L hip  Pain description: Sharp  Aggravating factors: when sitting certain ways Relieving factors: heat  helps, readjust   PRECAUTIONS: Fall  WEIGHT BEARING RESTRICTIONS: No  FALLS: Has patient fallen in last 6 months? No  LIVING ENVIRONMENT: Lives with: lives with their family Lives in: House/apartment Stairs: No Has following equipment at home: Environmental consultant - 2 wheeled, shower chair, and Grab bars  PLOF: Requires assistive device for independence, Needs assistance with ADLs, and Needs assistance with homemaking  PATIENT GOALS: "I want to get back to normal."   OBJECTIVE:   DIAGNOSTIC FINDINGS: Most recent nerve testing revealed AIDP  COGNITION: Overall cognitive status: Within functional limits for tasks assessed   SENSATION: Light touch: Impaired B numbness and tingling up to thighs and also in B hands  COORDINATION: WFL heel to shin     POSTURE: rounded shoulders and forward head Has to sit off of L hip at times due to pain     FUNCTIONAL TESTS: FROM EVAL 5 times sit to stand: 18.63 secs with B UE support  Timed up and go (TUG): 22.63 secs without AD with min A 10 meter walk test: 2.38 ft/sec with RW Berg Balance Scale: 33/56 (down from 42/56 when left CIR)       TODAY'S TREATMENT:                                                                                                                              DATE: 11/21/21   Therex:  Seated scifit x 5 mins at level 2 with BUE/LEs for generalized strengthening/endurance prior to session.  Pt tolerated well with only mild fatigue noted.  Initiated HEP, see below for exercises and reps performed.  Tolerated well.    In // bars:  Side stepping x 4 laps, side stepping with mini squat x 4 laps with light UE support and cues for posture and technique throughout.  Pt doing better at self moderating movement, as he sometimes tends to over perform task.  Ended with static standing x 2 sets of 20 secs without UE support with cues to keep hips over ankles for more core activation progressing to lateral weight shifts (cues for SLOW  controlled movement leading with hips) x 10 reps and ant/post weight shifts x 10 reach same cuing.  Ant/post more difficult in session.      PATIENT EDUCATION: Education details: Educated on evaluation results, POC, goals Person educated: Patient and Spouse Education method: Explanation and Verbal cues Education comprehension: verbalized understanding and needs further education  HOME EXERCISE PROGRAM: Access Code: [email or web address] URL: [email or web address] Date: 11/21/2022 Prepared by: Harriet Butte  Exercises - Standing March with Counter Support  - 1-2 x daily - 5 x weekly - 1-2 sets - 5-10 reps - 3 hold - Standing Thoracic and Cervical Rotation with Reach at Wall  - 1-2 x daily - 5 x weekly - 1-2 sets - 3-5 reps - 5 hold - Mini Squat with Counter Support  - 1-2 x daily - 5 x weekly - 1-2 sets - 5-10 reps - 3 hold - Standing Hip Abduction with Counter Support  - 1 x daily - 7 x weekly - 1-2 sets - 10 reps - Standing Hip Extension with Counter Support  - 1 x daily - 7 x weekly - 1-2 sets - 10 reps  GOALS: Goals reviewed with patient? Yes  SHORT TERM GOALS: Target date: 12/15/22  Pt will be independent with initial HEP for improved strength, balance, transfers and gait. Baseline: Goal status: INITIAL   2.  Pt will improve gait velocity to at least 2.75 ft/sec for improved gait efficiency and performance at S level  Baseline: 2.38 ft/sec with CGA and RW (1/3) Goal status: INITIAL   3.  vs to be assessed and STG written Baseline:  Goal status: INITIAL  4. Pt will improve BERG balance score 4 points from baseline in order to indicate dec fall risk.   Baseline: 33/56  Goal Status: Initial      LONG TERM GOALS: Target date: 01/13/2023   Pt will be independent with final HEP for improved strength, balance, transfers and gait. Baseline:  Goal status: INITIAL   2. Pt will improve gait velocity to at least 3.0 ft/sec with cane or no AD for improved gait  efficiency and performance at Supervision level  Baseline: 2.38 ft/sec with RW and CGA Goal status: INITIAL   3.  Pt will improve 5 x STS to less than or equal to 15 seconds without UE support to demonstrate improved functional strength and transfer efficiency.  Baseline: 18.63 with BUE support  Goal status: INITIAL   4.  Pt will improve normal TUG to less than or equal to 18 seconds without AD for improved functional mobility and decreased fall risk. Baseline: 15.97 sec (12/8) Goal status: INITIAL   5.  vs to be assessed and LTG written Baseline:  Goal status: INITIAL  6. Pt will improve BERG balance score 8 points from baseline in order to indicate dec fall risk.   Baseline: 33/56  Goal Status: INITIAL  7.  Pt will negotiate up/down 4 steps with single  rail/cane, up/down ramp and curb and ambulate x 500' outdoors over unlevel paved surfaces at S level with cane or less in order to indicate improved community mobility.   Baseline:    Goal Status: INITIAL    ASSESSMENT:  CLINICAL IMPRESSION: Skilled session focused on initiated HEP for generalized strengthening and balance.  Pt reports that now LLE feels weak and "dead" vs it was the right previously.  He did well with standing HEP so only made slight modifications and recommend a chair behind him for safety.  Pt sees Dr. Krista Blue today to try and get a referral to [facility].     OBJECTIVE IMPAIRMENTS: Abnormal gait, cardiopulmonary status limiting activity, decreased activity tolerance, decreased balance, decreased endurance, decreased knowledge of use of DME, decreased mobility, difficulty walking, decreased strength, impaired perceived functional ability, impaired sensation, impaired UE functional use, improper body mechanics, and postural dysfunction.   ACTIVITY LIMITATIONS: carrying, lifting, bending, standing, squatting, stairs, transfers, bathing, reach over head, and locomotion level  PARTICIPATION LIMITATIONS:  meal prep, cleaning, laundry, driving, shopping, community activity, and yard work  PERSONAL FACTORS: Past/current experiences and 3+ comorbidities: see above  are also affecting patient's functional outcome.   REHAB POTENTIAL: Excellent  CLINICAL DECISION MAKING: Evolving/moderate complexity  EVALUATION COMPLEXITY: Moderate  PLAN:  PT FREQUENCY: 3x/week  PT DURATION: 8 weeks  PLANNED INTERVENTIONS: Therapeutic exercises, Therapeutic activity, Neuromuscular re-education, Balance training, Gait training, Patient/Family education, Self Care, Stair training, Vestibular training, DME instructions, and Aquatic Therapy  PLAN FOR NEXT SESSION: 2MWT vs 6MWT and update goals, make sure HEP is going well, dynamic standing balance with small ranges initially with focus on control, compliant surfaces when able, gait with RW with obstacles and turns, activities without AD as able.    Cameron Sprang, PT, MPT [facility] [address] [place], [place], [address] Phone: [phone]   Fax:  [phone] 11/21/22, 1:36 PM

## 2022-11-22 ENCOUNTER — Ambulatory Visit: Payer: Medicare PPO | Admitting: Physical Therapy

## 2022-11-22 LAB — COMPREHENSIVE METABOLIC PANEL
ALT: 21 U/L (ref 0–53)
AST: 19 U/L (ref 0–37)
Albumin: 4.1 g/dL (ref 3.5–5.2)
Alkaline Phosphatase: 42 U/L (ref 39–117)
BUN: 24 mg/dL — ABNORMAL HIGH (ref 6–23)
CO2: 24 mEq/L (ref 19–32)
Calcium: 9.6 mg/dL (ref 8.4–10.5)
Chloride: 105 mEq/L (ref 96–112)
Creatinine, Ser: 1.03 mg/dL (ref 0.40–1.50)
GFR: 72.85 mL/min (ref >60.00)
Glucose, Bld: 101 mg/dL — ABNORMAL HIGH (ref 70–99)
Potassium: 4.4 mEq/L (ref 3.5–5.1)
Sodium: 138 mEq/L (ref 135–145)
Total Bilirubin: 0.3 mg/dL (ref 0.2–1.2)
Total Protein: 8.2 g/dL (ref 6.0–8.3)

## 2022-11-22 LAB — CBC
HCT: 41.8 % (ref 39.0–52.0)
Hemoglobin: 14.3 g/dL (ref 13.0–17.0)
MCHC: 34.3 g/dL (ref 30.0–36.0)
MCV: 97.9 fl (ref 78.0–100.0)
Platelets: 248 10*3/uL (ref 150.0–400.0)
RBC: 4.28 Mil/uL (ref 4.22–5.81)
RDW: 13.4 % (ref 11.5–15.5)
WBC: 5.8 10*3/uL (ref 4.0–10.5)

## 2022-11-22 LAB — VITAMIN B12: Vitamin B-12: 421 pg/mL (ref 211–911)

## 2022-11-23 ENCOUNTER — Ambulatory Visit: Payer: Medicare PPO | Admitting: Physical Therapy

## 2022-11-23 DIAGNOSIS — R208 Other disturbances of skin sensation: Secondary | ICD-10-CM | POA: Diagnosis not present

## 2022-11-23 DIAGNOSIS — M6281 Muscle weakness (generalized): Secondary | ICD-10-CM

## 2022-11-23 DIAGNOSIS — R278 Other lack of coordination: Secondary | ICD-10-CM | POA: Diagnosis not present

## 2022-11-23 DIAGNOSIS — R2689 Other abnormalities of gait and mobility: Secondary | ICD-10-CM | POA: Diagnosis not present

## 2022-11-23 DIAGNOSIS — R2681 Unsteadiness on feet: Secondary | ICD-10-CM | POA: Diagnosis not present

## 2022-11-23 NOTE — Therapy (Signed)
OUTPATIENT PHYSICAL THERAPY NEURO THERAPY   Patient Name: Joe Greene MRN: [id number] DOB:May 25, 1951, 72 y.o., male Today's Date: 11/23/2022   PCP: Arlester Marker, MD REFERRING PROVIDER: Marlowe Shores, PA-C  END OF SESSION:  PT End of Session - 11/23/22 1016     Visit Number 3    Number of Visits 25    Date for PT Re-Evaluation 01/13/23    Authorization Type [employer] [place]    Progress Note Due on Visit 10    PT Start Time 7341    PT Stop Time 1100    PT Time Calculation (min) 45 min    Equipment Utilized During Treatment Gait belt    Activity Tolerance Patient tolerated treatment well    Behavior During Therapy WFL for tasks assessed/performed              Past Medical History:  Diagnosis Date   AAA (abdominal aortic aneurysm) ([place])    Cataract    forming    Guillain Barr syndrome ([place])    Past Surgical History:  Procedure Laterality Date   FOOT SURGERY Right    HERNIA REPAIR Right    IR FLUORO GUIDE CV LINE RIGHT  10/25/2022   IR US GUIDE VASC ACCESS RIGHT  10/25/2022   KNEE ARTHROSCOPY Left    WISDOM TOOTH EXTRACTION     Patient Active Problem List   Diagnosis Date Noted   Constipation 11/15/2022   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[place]) 11/02/2022   Acute sensory neuropathy 10/25/2022   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[place]) 10/18/2022   B12 deficiency 10/18/2022   Gait abnormality 10/18/2022   Pre-diabetes 09/25/2022   AAA (abdominal aortic aneurysm) ([place]) 09/24/2022   Essential hypertension 09/24/2022   Iron deficiency 04/04/2022   Hypertriglyceridemia 04/03/2022   Coronary artery calcification 01/18/2022   Candidal intertrigo    Emphysema of lung ([place]) 07/26/2021   Aortic atherosclerosis ([place]) 07/26/2021   Internal hemorrhoids 01/23/2021   Diverticula of colon 01/23/2021   Eczema 01/23/2021   Actinic keratoses 01/23/2021   Tobacco use 09/02/2020    ONSET DATE: 11/06/2022 (referral date)  REFERRING DIAG: AIDP, GBS  THERAPY  DIAG:  Muscle weakness (generalized)  Unsteadiness on feet  Other abnormalities of gait and mobility  Rationale for Evaluation and Treatment: Rehabilitation  SUBJECTIVE:                                                                                                                                                                                             SUBJECTIVE STATEMENT: Pt reports he is "making it". Rating fatigue as 3/10 this morning. No pain but feels as though  his legs are "uncomfortable", unable to describe the feeling. No falls.    Pt accompanied by: self   PERTINENT HISTORY: Patient is in today for follow-up from his recent hospitalization. Joe Greene was admitted at [facility] from 11/13-11/20/2023. He presented with right leg weakness and ascending numbness of his fingers and toes. He had extensive work up. 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 He was treated with a course of IVIG. He underwent some initial physical therapy. Due to some logistical challenges, the decision was made to discharge him to home, rather than move to inpatient rehab.   Since returning home, Joe Greene notes he had fluctuating weakness in the legs. Therefore returned to MD and was hospitalized with new diagnosis of AIDP on 11/02/22, went to inpatient rehab and then returned home 11/10/22.   Other issues were identified during his hospital stay, including some hyperglycemia with an A1c of 6.5%, a mildly high TSH with a normal T4, elevated blood pressures, and some dyspnea issues. His blood pressure was high enough that he was started on losartan 25 mg daily. He has been trying to quit smoking now in light of his current issues. Additionally, the hospital noted his infrarenal AAA.  PAIN:  Are you having pain? No  PRECAUTIONS: Fall  WEIGHT BEARING RESTRICTIONS: No  FALLS: Has patient fallen in last 6 months? No  LIVING ENVIRONMENT: Lives with: lives with their family Lives  in: House/apartment Stairs: No Has following equipment at home: Environmental consultant - 2 wheeled, shower chair, and Grab bars  PLOF: Requires assistive device for independence, Needs assistance with ADLs, and Needs assistance with homemaking  PATIENT GOALS: "I want to get back to normal."   OBJECTIVE:   DIAGNOSTIC FINDINGS: Most recent nerve testing revealed AIDP  COGNITION: Overall cognitive status: Within functional limits for tasks assessed   SENSATION: Light touch: Impaired B numbness and tingling up to thighs and also in B hands  COORDINATION: WFL heel to shin     POSTURE: rounded shoulders and forward head Has to sit off of L hip at times due to pain     FUNCTIONAL TESTS: FROM EVAL 5 times sit to stand: 18.63 secs with B UE support  Timed up and go (TUG): 22.63 secs without AD with min A 10 meter walk test: 2.38 ft/sec with RW Berg Balance Scale: 33/56 (down from 42/56 when left CIR)    TODAY'S TREATMENT:                                                                                                                               Gait Training - 2MWT Gait pattern: step through pattern, decreased stride length, decreased hip/knee flexion- Right, decreased hip/knee flexion- Left, decreased ankle dorsiflexion- Right, decreased ankle dorsiflexion- Left, and narrow BOS Distance walked: 460' Assistive device utilized: Environmental consultant - 2 wheeled Level of assistance: CGA Comments: Pt had several instances of knee bucking bilaterally but is able to recover  well w/BUE support on walker. Noted very narrow BOS w/tendency towards scissoring, as pt catches foot on heel of forward foot. Noted significant ER of RLE as well.   Ther Ex  Modified Thomas stretch on L side for improved hip ROM and pain modulation, x3 minutes  Seated BAPS board exercises for improved ankle strength and proprioception. Performed bilaterally:  A/P taps, x10  L/R taps, x10  Counterclockwise and clockwise circles, x10 each  direction. Pt w/difficulty stabilizing board w/activity and sliding board in outward direction. Pt using hands on quads to stabilize leg throughout. Pt w/increased difficulty performing on LLE >RLE Pt rated fatigue as 4/10 following activity.  Double leg presses, x15 reps using 60#, for improved BLE strength and eccentric control. Mod cues to avoid hyperextending bilateral knees. Progressed to single leg at 50#, x8 per side. Pt required min A for eccentric control on L side but overall performed well. Rated fatigue as 5/10 following activity  NMR  Standing w/back to corner on airex with no UE support x2 minutes for improved ankle strategy, vestibular input for balance and BLE strength. Noted moderate A/P sway but pt able to stabilize without need for UE support. Progressed to mini squats on airex, x12 reps, w/fingertip support on chair. CGA for safety. No incidence of knee buckling noted and pt able to stabilize well.  In // bars for improved gait kinematics, foot placement and unsupported gait: Fwd and retro gait without UE support, x5 each direction w/CGA-min A for steadying assist. Cued pt to use mirror for visual biofeedback on foot position as pt assumes very narrow BOS, making it more difficult to stabilize. Progressed to tying red theraband around distal quads to provide tactile cue for hip abduction w/gait, x4 reps down and back. Noted improved foot placement in retro direction but continued difficulty in fwd direction. Provided pt with band to practice at home w/S*.     Pt rated fatigue as 5/10 following session  PATIENT EDUCATION: Education details: Continue HEP, walking w/band at home Person educated: Patient and Spouse Education method: Explanation and Verbal cues Education comprehension: verbalized understanding and needs further education  HOME EXERCISE PROGRAM: Access Code: [email or web address] URL: [email or web address] Date: 11/21/2022 Prepared by: Harriet Butte  Exercises - Standing March with Counter Support  - 1-2 x daily - 5 x weekly - 1-2 sets - 5-10 reps - 3 hold - Standing Thoracic and Cervical Rotation with Reach at Wall  - 1-2 x daily - 5 x weekly - 1-2 sets - 3-5 reps - 5 hold - Mini Squat with Counter Support  - 1-2 x daily - 5 x weekly - 1-2 sets - 5-10 reps - 3 hold - Standing Hip Abduction with Counter Support  - 1 x daily - 7 x weekly - 1-2 sets - 10 reps - Standing Hip Extension with Counter Support  - 1 x daily - 7 x weekly - 1-2 sets - 10 reps  GOALS: Goals reviewed with patient? Yes  SHORT TERM GOALS: Target date: 12/15/22  Pt will be independent with initial HEP for improved strength, balance, transfers and gait. Baseline: Goal status: INITIAL   2.  Pt will improve gait velocity to at least 2.75 ft/sec for improved gait efficiency and performance at S level  Baseline: 2.38 ft/sec with CGA and RW (1/3) Goal status: INITIAL   3.  Pt will ambulate 500' w/LRAD and S* on for improved endurance and efficiency with gait Baseline: 460' w/RW and CGA Goal status: REVISED  4. Pt will improve BERG balance score 4 points from baseline in order to indicate dec fall risk.   Baseline: 33/56  Goal Status: Initial      LONG TERM GOALS: Target date: 01/13/2023   Pt will be independent with final HEP for improved strength, balance, transfers and gait. Baseline:  Goal status: INITIAL   2. Pt will improve gait velocity to at least 3.0 ft/sec with cane or no AD for improved gait efficiency and performance at Supervision level  Baseline: 2.38 ft/sec with RW and CGA Goal status: INITIAL   3.  Pt will improve 5 x STS to less than or equal to 15 seconds without UE support to demonstrate improved functional strength and transfer efficiency.  Baseline: 18.63 with BUE support  Goal status: INITIAL   4.  Pt will improve normal TUG to less than or equal to 18 seconds without AD for improved functional mobility and decreased fall  risk. Baseline: 15.97 sec (12/8) Goal status: INITIAL   5. Pt will ambulate 550' w/LRAD and mod I on for improved endurance and efficiency with gait Baseline: 460' w/RW and CGA Goal status: REVISED  6. Pt will improve BERG balance score 8 points from baseline in order to indicate dec fall risk.   Baseline: 33/56  Goal Status: INITIAL  7.  Pt will negotiate up/down 4 steps with single rail/cane, up/down ramp and curb and ambulate x 500' outdoors over unlevel paved surfaces at S level with cane or less in order to indicate improved community mobility.   Baseline:    Goal Status: INITIAL    ASSESSMENT:  CLINICAL IMPRESSION: Emphasis of skilled PT session on BLE strength, improved foot placement w/gait and facilitation of ankle strategy. Pt tolerated session well w/minimal increase in fatigue. Pt continues to report L hip pain, did improve some w/thomas stretch. Pt exhibits narrow BOS w/gait and frequently catches his own feet together, increasing his fall risk. Provided red theraband as tactile cue for hip abduction with gait to promote hip-width BOS, but pt continues to have difficulty due to poor proprioception and weakness. Continue POC.   OBJECTIVE IMPAIRMENTS: Abnormal gait, cardiopulmonary status limiting activity, decreased activity tolerance, decreased balance, decreased endurance, decreased knowledge of use of DME, decreased mobility, difficulty walking, decreased strength, impaired perceived functional ability, impaired sensation, impaired UE functional use, improper body mechanics, and postural dysfunction.   ACTIVITY LIMITATIONS: carrying, lifting, bending, standing, squatting, stairs, transfers, bathing, reach over head, and locomotion level  PARTICIPATION LIMITATIONS: meal prep, cleaning, laundry, driving, shopping, community activity, and yard work  PERSONAL FACTORS: Past/current experiences and 3+ comorbidities: see above  are also affecting patient's functional outcome.    REHAB POTENTIAL: Excellent  CLINICAL DECISION MAKING: Evolving/moderate complexity  EVALUATION COMPLEXITY: Moderate  PLAN:  PT FREQUENCY: 3x/week  PT DURATION: 8 weeks  PLANNED INTERVENTIONS: Therapeutic exercises, Therapeutic activity, Neuromuscular re-education, Balance training, Gait training, Patient/Family education, Self Care, Stair training, Vestibular training, DME instructions, and Aquatic Therapy  PLAN FOR NEXT SESSION: How is HEP? dynamic standing balance with small ranges initially with focus on control, compliant surfaces when able, gait with RW with obstacles and turns, activities without AD as able.    Josephine Igo, PT, DPT [facility] [address] [place], [place]  [address] Phone:  [phone] Fax:  [phone] 11/23/22, 11:07 AM

## 2022-11-26 ENCOUNTER — Encounter: Payer: Self-pay | Admitting: Physical Therapy

## 2022-11-26 ENCOUNTER — Ambulatory Visit: Payer: Medicare PPO | Admitting: Physical Therapy

## 2022-11-26 ENCOUNTER — Encounter: Payer: Medicare PPO | Admitting: Occupational Therapy

## 2022-11-26 ENCOUNTER — Telehealth: Payer: Self-pay

## 2022-11-26 DIAGNOSIS — G6181 Chronic inflammatory demyelinating polyneuritis: Secondary | ICD-10-CM

## 2022-11-26 DIAGNOSIS — R2689 Other abnormalities of gait and mobility: Secondary | ICD-10-CM

## 2022-11-26 DIAGNOSIS — M6281 Muscle weakness (generalized): Secondary | ICD-10-CM | POA: Diagnosis not present

## 2022-11-26 DIAGNOSIS — G610 Guillain-Barre syndrome: Secondary | ICD-10-CM

## 2022-11-26 DIAGNOSIS — R208 Other disturbances of skin sensation: Secondary | ICD-10-CM | POA: Diagnosis not present

## 2022-11-26 DIAGNOSIS — R2681 Unsteadiness on feet: Secondary | ICD-10-CM | POA: Diagnosis not present

## 2022-11-26 DIAGNOSIS — R278 Other lack of coordination: Secondary | ICD-10-CM | POA: Diagnosis not present

## 2022-11-26 NOTE — Therapy (Signed)
OUTPATIENT PHYSICAL THERAPY NEURO THERAPY   Patient Name: Joe Greene MRN: [id number] DOB:07-22-1951, 72 y.o., male Today's Date: 11/26/2022   PCP: Arlester Marker, MD REFERRING PROVIDER: Marlowe Shores, PA-C  END OF SESSION:  PT End of Session - 11/26/22 0938     Visit Number 4    Number of Visits 25    Date for PT Re-Evaluation 01/13/23    Authorization Type [employer] [place]    Progress Note Due on Visit 10    PT Start Time 0933    PT Stop Time 1015    PT Time Calculation (min) 42 min    Equipment Utilized During Treatment Gait belt    Activity Tolerance Patient tolerated treatment well    Behavior During Therapy WFL for tasks assessed/performed              Past Medical History:  Diagnosis Date   AAA (abdominal aortic aneurysm) ([place])    Cataract    forming    Guillain Barr syndrome ([place])    Past Surgical History:  Procedure Laterality Date   FOOT SURGERY Right    HERNIA REPAIR Right    IR FLUORO GUIDE CV LINE RIGHT  10/25/2022   IR US GUIDE VASC ACCESS RIGHT  10/25/2022   KNEE ARTHROSCOPY Left    WISDOM TOOTH EXTRACTION     Patient Active Problem List   Diagnosis Date Noted   Constipation 11/15/2022   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[place]) 11/02/2022   Acute sensory neuropathy 10/25/2022   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[place]) 10/18/2022   B12 deficiency 10/18/2022   Gait abnormality 10/18/2022   Pre-diabetes 09/25/2022   AAA (abdominal aortic aneurysm) ([place]) 09/24/2022   Essential hypertension 09/24/2022   Iron deficiency 04/04/2022   Hypertriglyceridemia 04/03/2022   Coronary artery calcification 01/18/2022   Candidal intertrigo    Emphysema of lung ([place]) 07/26/2021   Aortic atherosclerosis ([place]) 07/26/2021   Internal hemorrhoids 01/23/2021   Diverticula of colon 01/23/2021   Eczema 01/23/2021   Actinic keratoses 01/23/2021   Tobacco use 09/02/2020    ONSET DATE: 11/06/2022 (referral date)  REFERRING DIAG: AIDP, GBS  THERAPY  DIAG:  Muscle weakness (generalized)  Unsteadiness on feet  Other abnormalities of gait and mobility  Rationale for Evaluation and Treatment: Rehabilitation  SUBJECTIVE:                                                                                                                                                                                             SUBJECTIVE STATEMENT: Pt reports his left hip is bothering him since he laid in a hospital bed for 72 days.  He is continuing to have issues with the knees buckling.  He denies falls. He rates current fatigue as 2/10.  He is doing his HEP 3-4x a day and states "if I don't get better, it's not my fault."   Pt accompanied by: self   PERTINENT HISTORY: Patient is in today for follow-up from his recent hospitalization. Joe Greene was admitted at [facility] from 11/13-11/20/2023. He presented with right leg weakness and ascending numbness of his fingers and toes. He had extensive work up. 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 He was treated with a course of IVIG. He underwent some initial physical therapy. Due to some logistical challenges, the decision was made to discharge him to home, rather than move to inpatient rehab.   Since returning home, Joe Greene notes he had fluctuating weakness in the legs. Therefore returned to [place] and was hospitalized with new diagnosis of AIDP on 11/02/22, went to inpatient rehab and then returned home 11/10/22.   Other issues were identified during his hospital stay, including some hyperglycemia with an A1c of 6.5%, a mildly high TSH with a normal T4, elevated blood pressures, and some dyspnea issues. His blood pressure was high enough that he was started on losartan 25 mg daily. He has been trying to quit smoking now in light of his current issues. Additionally, the hospital noted his infrarenal AAA.  PAIN:  Are you having pain? Yes: NPRS scale: 4/10 Pain location: left hip Pain  description: "like you're sticking an ice pick in it" Aggravating factors: getting in the wrong position Relieving factors: resting  PRECAUTIONS: Fall  WEIGHT BEARING RESTRICTIONS: No  FALLS: Has patient fallen in last 6 months? No  LIVING ENVIRONMENT: Lives with: lives with their family Lives in: House/apartment Stairs: No Has following equipment at home: Environmental consultant - 2 wheeled, shower chair, and Grab bars  PLOF: Requires assistive device for independence, Needs assistance with ADLs, and Needs assistance with homemaking  PATIENT GOALS: "I want to get back to normal."   OBJECTIVE:   DIAGNOSTIC FINDINGS: Most recent nerve testing revealed AIDP  COGNITION: Overall cognitive status: Within functional limits for tasks assessed   SENSATION: Light touch: Impaired B numbness and tingling up to thighs and also in B hands  COORDINATION: WFL heel to shin     POSTURE: rounded shoulders and forward head Has to sit off of L hip at times due to pain     FUNCTIONAL TESTS: FROM EVAL 5 times sit to stand: 18.63 secs with B UE support  Timed up and go (TUG): 22.63 secs without AD with min A 10 meter walk test: 2.38 ft/sec with RW Berg Balance Scale: 33/56 (down from 42/56 when left CIR)    TODAY'S TREATMENT:                                                                                                                               Gait Training Gait pattern:  step through pattern, decreased stride length, decreased hip/knee flexion- Right, decreased hip/knee flexion- Left, decreased ankle dorsiflexion- Right, decreased ankle dorsiflexion- Left, and narrow BOS Distance walked: 200' Assistive device utilized: Environmental consultant - 2 wheeled Level of assistance: CGA Comments: Pt ambulates a figure 8 pattern in clinic as well as x4 through and over obstacle course using cones and 2-4" hurdles to practice turns.  He requires cuing to maintain RW contact w/ ground during turning as well as for safe RW  navigation w/ stepping over obstacles.  He paces ambulatory tasks quickly and maintains a widened and ER BOS.  Ther Ex  Countertop lateral and forward lunges x15 alt LE all 3 directions  NMR  STS on airex 2x8, initial round w/ RUE on RW, second round no UE support 4" step taps > 6" step taps no UE support CGA 6" step ups w/ RUE support only alt LE x20 Side stepping no UE support 6x10', cued to prevent quad compensation Forward and lateral 4" hurdles w/ BUE support on counter Advance/retreat progressing to no UE support over 4" hurdle x40  Pt rated fatigue as 4/10 following session.  Pt severely dyspneic following all tasks requiring brief rest between tasks.  PATIENT EDUCATION: Education details: Decrease number of times HEP is completed per day to ensure rest and recovery as limit current high level of muscle soreness pt is experiencing.   Person educated: Patient and Spouse Education method: Explanation and Verbal cues Education comprehension: verbalized understanding and needs further education  HOME EXERCISE PROGRAM: Access Code: [email or web address] URL: [email or web address] Date: 11/21/2022 Prepared by: Harriet Butte  Exercises - Standing March with Counter Support  - 1-2 x daily - 5 x weekly - 1-2 sets - 5-10 reps - 3 hold - Standing Thoracic and Cervical Rotation with Reach at Wall  - 1-2 x daily - 5 x weekly - 1-2 sets - 3-5 reps - 5 hold - Mini Squat with Counter Support  - 1-2 x daily - 5 x weekly - 1-2 sets - 5-10 reps - 3 hold - Standing Hip Abduction with Counter Support  - 1 x daily - 7 x weekly - 1-2 sets - 10 reps - Standing Hip Extension with Counter Support  - 1 x daily - 7 x weekly - 1-2 sets - 10 reps  GOALS: Goals reviewed with patient? Yes  SHORT TERM GOALS: Target date: 12/15/22  Pt will be independent with initial HEP for improved strength, balance, transfers and gait. Baseline: Goal status: INITIAL   2.  Pt will improve gait velocity to at least  2.75 ft/sec for improved gait efficiency and performance at S level  Baseline: 2.38 ft/sec with CGA and RW (1/3) Goal status: INITIAL   3.  Pt will ambulate 500' w/LRAD and S* on for improved endurance and efficiency with gait Baseline: 460' w/RW and CGA Goal status: REVISED   4. Pt will improve BERG balance score 4 points from baseline in order to indicate dec fall risk.   Baseline: 33/56  Goal Status: Initial      LONG TERM GOALS: Target date: 01/13/2023   Pt will be independent with final HEP for improved strength, balance, transfers and gait. Baseline:  Goal status: INITIAL   2. Pt will improve gait velocity to at least 3.0 ft/sec with cane or no AD for improved gait efficiency and performance at Supervision level  Baseline: 2.38 ft/sec with RW and CGA Goal status: INITIAL   3.  Pt will improve 5 x STS  to less than or equal to 15 seconds without UE support to demonstrate improved functional strength and transfer efficiency.  Baseline: 18.63 with BUE support  Goal status: INITIAL   4.  Pt will improve normal TUG to less than or equal to 18 seconds without AD for improved functional mobility and decreased fall risk. Baseline: 15.97 sec (12/8) Goal status: INITIAL   5. Pt will ambulate 550' w/LRAD and mod I on 2MWT for improved endurance and efficiency with gait Baseline: 460' w/RW and CGA Goal status: REVISED  6. Pt will improve BERG balance score 8 points from baseline in order to indicate dec fall risk.   Baseline: 33/56  Goal Status: INITIAL  7.  Pt will negotiate up/down 4 steps with single rail/cane, up/down ramp and curb and ambulate x 500' outdoors over unlevel paved surfaces at S level with cane or less in order to indicate improved community mobility.   Baseline:    Goal Status: INITIAL    ASSESSMENT:  CLINICAL IMPRESSION: Focus of skilled session today on continuing to address BLE weakness w/ functional strengthening.  Progressed session to gait training  with emphasis on obstacle navigation requiring cuing to maintain appropriate safe contact of RW to ground.  Further addressed SLS and unsupported balance this session with pt tolerating all challenges well reporting low levels of fatigue.  PT is unsure how accurate fatigue ratings are based on dyspnea on exertion.  Will continue per POC.  OBJECTIVE IMPAIRMENTS: Abnormal gait, cardiopulmonary status limiting activity, decreased activity tolerance, decreased balance, decreased endurance, decreased knowledge of use of DME, decreased mobility, difficulty walking, decreased strength, impaired perceived functional ability, impaired sensation, impaired UE functional use, improper body mechanics, and postural dysfunction.   ACTIVITY LIMITATIONS: carrying, lifting, bending, standing, squatting, stairs, transfers, bathing, reach over head, and locomotion level  PARTICIPATION LIMITATIONS: meal prep, cleaning, laundry, driving, shopping, community activity, and yard work  PERSONAL FACTORS: Past/current experiences and 3+ comorbidities: see above  are also affecting patient's functional outcome.   REHAB POTENTIAL: Excellent  CLINICAL DECISION MAKING: Evolving/moderate complexity  EVALUATION COMPLEXITY: Moderate  PLAN:  PT FREQUENCY: 3x/week  PT DURATION: 8 weeks  PLANNED INTERVENTIONS: Therapeutic exercises, Therapeutic activity, Neuromuscular re-education, Balance training, Gait training, Patient/Family education, Self Care, Stair training, Vestibular training, DME instructions, and Aquatic Therapy  PLAN FOR NEXT SESSION: How is HEP? dynamic standing balance with small ranges initially with focus on control, compliant surfaces when able, gait with RW with obstacles and turns, activities without AD as able.  Stretch left hip - piriformis, glut   Elease Etienne, PT, DPT [facility] [address] [place],   [address] Phone:  [phone] Fax:  [phone] 11/26/22,  1:57 PM

## 2022-11-26 NOTE — Telephone Encounter (Signed)
Your referral arrived @ [facility]. However, the scheduler said the wording : "muscle weakness" was too general. This wording has moved our request to the back of the line - July is the earliest available. We have asked to be put on their waiting list. We would be grateful if you could re-send the referral with 'Gillian-Barre Syndrome' as the reason for referral.  Thank you!

## 2022-11-27 ENCOUNTER — Ambulatory Visit: Payer: Medicare PPO | Admitting: Occupational Therapy

## 2022-11-28 ENCOUNTER — Encounter: Payer: Self-pay | Admitting: Rehabilitation

## 2022-11-28 ENCOUNTER — Ambulatory Visit: Payer: Medicare PPO | Admitting: Rehabilitation

## 2022-11-28 DIAGNOSIS — R2681 Unsteadiness on feet: Secondary | ICD-10-CM | POA: Diagnosis not present

## 2022-11-28 DIAGNOSIS — R2689 Other abnormalities of gait and mobility: Secondary | ICD-10-CM

## 2022-11-28 DIAGNOSIS — M6281 Muscle weakness (generalized): Secondary | ICD-10-CM

## 2022-11-28 DIAGNOSIS — R208 Other disturbances of skin sensation: Secondary | ICD-10-CM | POA: Diagnosis not present

## 2022-11-28 DIAGNOSIS — R278 Other lack of coordination: Secondary | ICD-10-CM | POA: Diagnosis not present

## 2022-11-28 NOTE — Telephone Encounter (Signed)
Message sent to patient's wife. Dm/cma

## 2022-11-28 NOTE — Therapy (Signed)
OUTPATIENT PHYSICAL THERAPY NEURO THERAPY   Patient Name: Joe Greene MRN: [id number] DOB:Mar 20, 1951, 72 y.o., male Today's Date: 11/28/2022   PCP: Herbie Drape, MD REFERRING PROVIDER: Deatra Ina, PA-C  END OF SESSION:  PT End of Session - 11/28/22 1026     Visit Number 5    Number of Visits 25    Date for PT Re-Evaluation 01/13/23    Authorization Type [employer]    Progress Note Due on Visit 10    PT Start Time 1018    PT Stop Time 1100    PT Time Calculation (min) 42 min    Equipment Utilized During Treatment Gait belt    Activity Tolerance Patient tolerated treatment well    Behavior During Therapy WFL for tasks assessed/performed              Past Medical History:  Diagnosis Date   AAA (abdominal aortic aneurysm) ([facility])    Cataract    forming    Guillain Barr syndrome ([facility])    Past Surgical History:  Procedure Laterality Date   FOOT SURGERY Right    HERNIA REPAIR Right    IR FLUORO GUIDE CV LINE RIGHT  10/25/2022   IR US GUIDE VASC ACCESS RIGHT  10/25/2022   KNEE ARTHROSCOPY Left    WISDOM TOOTH EXTRACTION     Patient Active Problem List   Diagnosis Date Noted   Constipation 11/15/2022   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[facility]) 11/02/2022   Acute sensory neuropathy 10/25/2022   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/2022   B12 deficiency 10/18/2022   Gait abnormality 10/18/2022   Pre-diabetes 09/25/2022   AAA (abdominal aortic aneurysm) ([facility]) 09/24/2022   Essential hypertension 09/24/2022   Iron deficiency 04/04/2022   Hypertriglyceridemia 04/03/2022   Coronary artery calcification 01/18/2022   Candidal intertrigo    Emphysema of lung ([facility]) 07/26/2021   Aortic atherosclerosis ([facility]) 07/26/2021   Internal hemorrhoids 01/23/2021   Diverticula of colon 01/23/2021   Eczema 01/23/2021   Actinic keratoses 01/23/2021   Tobacco use 09/02/2020    ONSET DATE: 11/06/2022 (referral date)  REFERRING DIAG: AIDP, GBS  THERAPY  DIAG:  Muscle weakness (generalized)  Unsteadiness on feet  Other abnormalities of gait and mobility  Rationale for Evaluation and Treatment: Rehabilitation  SUBJECTIVE:                                                                                                                                                                                             SUBJECTIVE STATEMENT: Pt reports left hip still bothering him, otherwise doing "okay."    Pt accompanied by: self  PERTINENT HISTORY: Patient is in today for follow-up from his recent hospitalization. [name] was admitted at [facility] from 11/13-11/20/2023. He presented with right leg weakness and ascending numbness of his fingers and toes. He had extensive work up. 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 He was treated with a course of IVIG. He underwent some initial physical therapy. Due to some logistical challenges, the decision was made to discharge him to home, rather than move to inpatient rehab.   Since returning home, [name] notes he had fluctuating weakness in the legs. Therefore returned to MD and was hospitalized with new diagnosis of AIDP on 11/02/22, went to inpatient rehab and then returned home 11/10/22.   Other issues were identified during his hospital stay, including some hyperglycemia with an A1c of 6.5%, a mildly high TSH with a normal T4, elevated blood pressures, and some dyspnea issues. His blood pressure was high enough that he was started on losartan 25 mg daily. He has been trying to quit smoking now in light of his current issues. Additionally, the hospital noted his infrarenal AAA.  PAIN:  Are you having pain? Yes: NPRS scale: 4/10 Pain location: left hip Pain description: "like you're sticking an ice pick in it" Aggravating factors: getting in the wrong position Relieving factors: resting  PRECAUTIONS: Fall  WEIGHT BEARING RESTRICTIONS: No  FALLS: Has patient fallen in last  6 months? No  LIVING ENVIRONMENT: Lives with: lives with their family Lives in: House/apartment Stairs: No Has following equipment at home: Environmental consultant - 2 wheeled, shower chair, and Grab bars  PLOF: Requires assistive device for independence, Needs assistance with ADLs, and Needs assistance with homemaking  PATIENT GOALS: "I want to get back to normal."   OBJECTIVE:   DIAGNOSTIC FINDINGS: Most recent nerve testing revealed AIDP  COGNITION: Overall cognitive status: Within functional limits for tasks assessed   SENSATION: Light touch: Impaired B numbness and tingling up to thighs and also in B hands  COORDINATION: WFL heel to shin     POSTURE: rounded shoulders and forward head Has to sit off of L hip at times due to pain     FUNCTIONAL TESTS: FROM EVAL 5 times sit to stand: 18.63 secs with B UE support  Timed up and go (TUG): 22.63 secs without AD with min A 10 meter walk test: 2.38 ft/sec with RW Berg Balance Scale: 33/56 (down from 42/56 when left CIR)    TODAY'S TREATMENT:                                                                                                                               Gait Training Gait pattern: step through pattern, decreased stride length, decreased hip/knee flexion- Right, decreased hip/knee flexion- Left, decreased ankle dorsiflexion- Right, decreased ankle dorsiflexion- Left, and narrow BOS Distance walked: 200' Assistive device utilized: Environmental consultant - 2 wheeled Level of assistance: CGA Comments: Pt ambulates with RW throughout session at [employer]  I level.  Continues to have ER in BLEs however he does demo improvement in BOS, causing him to not trip himself on his own feet.  We did some gait during session with light HHA from PT with slightly wider BOS noted, but no overt LOB.    NMR:  Standing without UE support, reaching laterally for bean bag and stepping foot forward with toss x 8 reps (each side) then retrieving bags from ground, first rep  with RW, using light support then second round not using UE support (ambulated over to bags with light support from PT).  Some mild LOB noted initially with returning to midline from forward step, but this improved with practice.    Tapping to targets in varied directions with each LE called at random by PT (ant, lat, post) with min A at most (esp crossing midline).  Performed x 2 sets of about 2 mins.   In // bars standing on foam beam:  feet apart maintaining balance x 2 sets of 20 secs>alt forward stepping x 10 reps, backwards stepping x 10 reps all with light to no UE support.  Ended task with PT providing random perturbations to elicit step strategy without UE support.  Pt has more difficulty with returning to beam than stepping.   Therex: Prior to session had pt perform L knee to chest x 30 secs,  L knee to R shoulder x 30 secs then figure 4 L piriformis stretch, which seemed to "get the spot" therefore added to HEP in session.  Performed x 2 sets of 30 secs.   Pt reports up to 8/10 fatigue with bending down to gather bean bags but this improved on second rep with cues for breathing with motion.  From then on, fatigue did not increase past 5-6/10.   PATIENT EDUCATION: Education details: additional stretch for L hip  Person educated: Patient and Spouse Education method: Explanation and Verbal cues Education comprehension: verbalized understanding and needs further education  HOME EXERCISE PROGRAM: Access Code: [email or web address] URL: [email or web address] Date: 11/21/2022 Prepared by: Cameron Sprang  Exercises - Standing March with Counter Support  - 1-2 x daily - 5 x weekly - 1-2 sets - 5-10 reps - 3 hold - Standing Thoracic and Cervical Rotation with Reach at Wall  - 1-2 x daily - 5 x weekly - 1-2 sets - 3-5 reps - 5 hold - Mini Squat with Counter Support  - 1-2 x daily - 5 x weekly - 1-2 sets - 5-10 reps - 3 hold - Standing Hip Abduction with Counter Support  - 1 x daily - 7 x  weekly - 1-2 sets - 10 reps - Standing Hip Extension with Counter Support  - 1 x daily - 7 x weekly - 1-2 sets - 10 reps -Figure 4 piriformis stretch  GOALS: Goals reviewed with patient? Yes  SHORT TERM GOALS: Target date: 12/15/22  Pt will be independent with initial HEP for improved strength, balance, transfers and gait. Baseline: Goal status: INITIAL   2.  Pt will improve gait velocity to at least 2.75 ft/sec for improved gait efficiency and performance at S level  Baseline: 2.38 ft/sec with CGA and RW (1/3) Goal status: INITIAL   3.  Pt will ambulate 500' w/LRAD and S* on 2MWT for improved endurance and efficiency with gait Baseline: 460' w/RW and CGA Goal status: REVISED   4. Pt will improve BERG balance score 4 points from baseline in order to indicate dec fall risk.   Baseline: 33/56  Goal Status: Initial      LONG TERM GOALS: Target date: 01/13/2023   Pt will be independent with final HEP for improved strength, balance, transfers and gait. Baseline:  Goal status: INITIAL   2. Pt will improve gait velocity to at least 3.0 ft/sec with cane or no AD for improved gait efficiency and performance at Supervision level  Baseline: 2.38 ft/sec with RW and CGA Goal status: INITIAL   3.  Pt will improve 5 x STS to less than or equal to 15 seconds without UE support to demonstrate improved functional strength and transfer efficiency.  Baseline: 18.63 with BUE support  Goal status: INITIAL   4.  Pt will improve normal TUG to less than or equal to 18 seconds without AD for improved functional mobility and decreased fall risk. Baseline: 15.97 sec (12/8) Goal status: INITIAL   5. Pt will ambulate 550' w/LRAD and mod I on 2MWT for improved endurance and efficiency with gait Baseline: 460' w/RW and CGA Goal status: REVISED  6. Pt will improve BERG balance score 8 points from baseline in order to indicate dec fall risk.   Baseline: 33/56  Goal Status: INITIAL  7.  Pt will  negotiate up/down 4 steps with single rail/cane, up/down ramp and curb and ambulate x 500' outdoors over unlevel paved surfaces at S level with cane or less in order to indicate improved community mobility.   Baseline:    Goal Status: INITIAL    ASSESSMENT:  CLINICAL IMPRESSION: Skilled session focused on dynamic standing balance with and without support, reaching to ground, and hip and stepping strategy.  Pt tolerated well with most fatigue happening with bending to floor.    OBJECTIVE IMPAIRMENTS: Abnormal gait, cardiopulmonary status limiting activity, decreased activity tolerance, decreased balance, decreased endurance, decreased knowledge of use of DME, decreased mobility, difficulty walking, decreased strength, impaired perceived functional ability, impaired sensation, impaired UE functional use, improper body mechanics, and postural dysfunction.   ACTIVITY LIMITATIONS: carrying, lifting, bending, standing, squatting, stairs, transfers, bathing, reach over head, and locomotion level  PARTICIPATION LIMITATIONS: meal prep, cleaning, laundry, driving, shopping, community activity, and yard work  PERSONAL FACTORS: Past/current experiences and 3+ comorbidities: see above  are also affecting patient's functional outcome.   REHAB POTENTIAL: Excellent  CLINICAL DECISION MAKING: Evolving/moderate complexity  EVALUATION COMPLEXITY: Moderate  PLAN:  PT FREQUENCY: 3x/week  PT DURATION: 8 weeks  PLANNED INTERVENTIONS: Therapeutic exercises, Therapeutic activity, Neuromuscular re-education, Balance training, Gait training, Patient/Family education, Self Care, Stair training, Vestibular training, DME instructions, and Aquatic Therapy  PLAN FOR NEXT SESSION: How is HEP? dynamic standing balance with small ranges initially with focus on control, compliant surfaces when able, gait with RW with obstacles and turns, activities without AD as able.  Stretch left hip - piriformis, glut   Cameron Sprang, PT, MPT [facility] [address] [place], [place], [address] Phone: [phone]   Fax:  [phone] 11/28/22, 12:18 PM

## 2022-11-29 ENCOUNTER — Encounter: Payer: Medicare PPO | Admitting: Physical Medicine & Rehabilitation

## 2022-11-29 ENCOUNTER — Ambulatory Visit: Payer: Medicare PPO | Admitting: Physical Therapy

## 2022-11-29 ENCOUNTER — Encounter: Payer: Medicare PPO | Admitting: Occupational Therapy

## 2022-11-29 LAB — MISC LABCORP TEST (SEND OUT): Labcorp test code: 9985

## 2022-11-30 ENCOUNTER — Ambulatory Visit: Payer: Medicare PPO | Admitting: Physical Therapy

## 2022-11-30 DIAGNOSIS — M6281 Muscle weakness (generalized): Secondary | ICD-10-CM

## 2022-11-30 DIAGNOSIS — R208 Other disturbances of skin sensation: Secondary | ICD-10-CM | POA: Diagnosis not present

## 2022-11-30 DIAGNOSIS — R2689 Other abnormalities of gait and mobility: Secondary | ICD-10-CM | POA: Diagnosis not present

## 2022-11-30 DIAGNOSIS — R2681 Unsteadiness on feet: Secondary | ICD-10-CM | POA: Diagnosis not present

## 2022-11-30 DIAGNOSIS — R278 Other lack of coordination: Secondary | ICD-10-CM | POA: Diagnosis not present

## 2022-11-30 NOTE — Therapy (Signed)
OUTPATIENT PHYSICAL THERAPY NEURO THERAPY   Patient Name: Joe Greene MRN: [id number] DOB:1951-03-24, 72 y.o., male Today's Date: 11/30/2022   PCP: Joe Marker, MD REFERRING PROVIDER: Marlowe Sh[place], PA-C  END OF SESSION:  PT End of Session - 11/30/22 1016     Visit Number 6    Number of Visits 25    Date for PT Re-Evaluation 01/13/23    Authorization Type [employer] [place]    Progress Note Due on Visit 10    PT Start Time 4010    PT Stop Time 1057    PT Time Calculation (min) 42 min    Equipment Utilized During Treatment Gait belt    Activity Tolerance Patient tolerated treatment well    Behavior During Therapy WFL for tasks assessed/performed               Past Medical History:  Diagnosis Date   AAA (abdominal aortic aneurysm) ([place])    Cataract    forming    Guillain Barr syndrome ([place])    Past Surgical History:  Procedure Laterality Date   FOOT SURGERY Right    HERNIA REPAIR Right    IR FLUORO GUIDE CV LINE RIGHT  10/25/2022   IR US GUIDE VASC ACCESS RIGHT  10/25/2022   KNEE ARTHROSCOPY Left    WISDOM TOOTH EXTRACTION     Patient Active Problem List   Diagnosis Date Noted   Constipation 11/15/2022   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[place]) 11/02/2022   Acute sensory neuropathy 10/25/2022   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[place]) 10/18/2022   B12 deficiency 10/18/2022   Gait abnormality 10/18/2022   Pre-diabetes 09/25/2022   AAA (abdominal aortic aneurysm) ([place]) 09/24/2022   Essential hypertension 09/24/2022   Iron deficiency 04/04/2022   Hypertriglyceridemia 04/03/2022   Coronary artery calcification 01/18/2022   Candidal intertrigo    Emphysema of lung ([place]) 07/26/2021   Aortic atherosclerosis ([place]) 07/26/2021   Internal hemorrhoids 01/23/2021   Diverticula of colon 01/23/2021   Eczema 01/23/2021   Actinic keratoses 01/23/2021   Tobacco use 09/02/2020    ONSET DATE: 11/06/2022 (referral date)  REFERRING DIAG: AIDP, GBS  THERAPY  DIAG:  Muscle weakness (generalized)  Unsteadiness on feet  Other abnormalities of gait and mobility  Rationale for Evaluation and Treatment: Rehabilitation  SUBJECTIVE:                                                                                                                                                                                             SUBJECTIVE STATEMENT: Pt reports feeling like his legs are "weaker" today, started feeling like this yesterday. Rating fatigue as  5/10, no falls.    Pt accompanied by: self   PERTINENT HISTORY: Patient is in today for follow-up from his recent hospitalization. Joe Greene was admitted at [facility] from 11/13-11/20/2023. He presented with right leg weakness and ascending numbness of his fingers and toes. He had extensive work up. 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 He was treated with a course of IVIG. He underwent some initial physical therapy. Due to some logistical challenges, the decision was made to discharge him to home, rather than move to inpatient rehab.   Since returning home, Joe Greene notes he had fluctuating weakness in the legs. Therefore returned to MD and was hospitalized with new diagnosis of AIDP on 11/02/22, went to inpatient rehab and then returned home 11/10/22.   Other issues were identified during his hospital stay, including some hyperglycemia with an A1c of 6.5%, a mildly high TSH with a normal T4, elevated blood pressures, and some dyspnea issues. His blood pressure was high enough that he was started on losartan 25 mg daily. He has been trying to quit smoking now in light of his current issues. Additionally, the hospital noted his infrarenal AAA.  PAIN:  Are you having pain? No  PRECAUTIONS: Fall  WEIGHT BEARING RESTRICTIONS: No  FALLS: Has patient fallen in last 6 months? No  LIVING ENVIRONMENT: Lives with: lives with their family Lives in: House/apartment Stairs: No Has  following equipment at home: Environmental consultant - 2 wheeled, shower chair, and Grab bars  PLOF: Requires assistive device for independence, Needs assistance with ADLs, and Needs assistance with homemaking  PATIENT GOALS: "I want to get back to normal."   OBJECTIVE:   DIAGNOSTIC FINDINGS: Most recent nerve testing revealed AIDP  COGNITION: Overall cognitive status: Within functional limits for tasks assessed   SENSATION: Light touch: Impaired B numbness and tingling up to thighs and also in B hands  COORDINATION: WFL heel to shin     POSTURE: rounded shoulders and forward head Has to sit off of L hip at times due to pain     FUNCTIONAL TESTS: FROM EVAL 5 times sit to stand: 18.63 secs with B UE support  Timed up and go (TUG): 22.63 secs without AD with min A 10 meter walk test: 2.38 ft/sec with RW Berg Balance Scale: 33/56 (down from 42/56 when left CIR)    TODAY'S TREATMENT:               Ther Ex  Added seated piriformis stretch to HEP as pt does not like photo of supine stretch (see bolded below)     NMR  Blaze pods on random hit setting (1 min on, 30s rest for 3 rounds using 5 pods) for improved LE coordination, standing tolerance and single leg stability. No UE support and CGA throughout:  Round 1: 5 pods arranged in semi-circle pattern Round 2: 5 pods arranged randomly w/2 pods elevated on 2" step (only performed single 60s round of this)  In // bars for improved ankle strategy, reactive balance strategies, LE coordination, single leg stance and BLE strength: On blue mat, fwd and retro gait without UE support, x3 each direction. Noted wider BOS in fwd direction but continued scissoring w/retro. CGA throughout Progressed to fwd 4" hurdle navigation using step-to pattern on blue mat, x4 reps (alt leading leg each rep). Pt w/increased difficulty when leading w/LLE > RLE  Trialed step-through pattern and pt able to perform but moving very quickly and holding breath. Min cues to  maintain breathing throughout to avoid valsalva, but pt unable to do so. X2 reps down and back.  Lateral hurdle navigation on blue mat, x2 reps each direction. Pt performed well w/no LOB but continued to hold breath  On rocker board in A/P direction: Standing without UE support x2 minutes using mirror for visual biofeedback on body position. Pt had great difficulty w/this and frequently relied on hip strategy to recover balance.  Progressed to A/P weight shifts and pt unable to maintain anterior shift well without UE support or use of hip strategy. Pt reported 6/10 fatigue w/activity and rated RPE as 81/10.  Progressed to ball tosses on board w/2nd therapist to facilitate reaching out of BOS, x2 minutes. Pt performed well w/no LOB, CGA throughout.  Sit <>stands from 12" box w/blue airex on top x3 reps to replicate standing from recliner, as this tends to aggravate pt's piriformis pain. Pt denied pain in piriformis but did have crepitus in L knee and reported pain there. This therapist suspect weakness in VMO due to patella tracking too far laterally. Added green theraband around distal quads to facilitate increased hip activation x3 reps and pt performed well but continued to report pain in L knee.                                                                                                              Gait Training Gait pattern: step through pattern, decreased stride length, decreased hip/knee flexion- Right, decreased hip/knee flexion- Left, decreased ankle dorsiflexion- Right, decreased ankle dorsiflexion- Left, and narrow BOS Distance walked: Various clinic distances  Assistive device utilized: Walker - 2 wheeled and None Level of assistance: CGA Comments: Pt ambulated into and out of clinic w/RW and ambulated throughout session without AD. Noted wide BOS without AD, CGA for safety.     PATIENT EDUCATION: Education details: updates to HEP, importance of not overdoing exercise at home   Person educated: Patient Education method: Explanation and Verbal cues Education comprehension: verbalized understanding and needs further education  HOME EXERCISE PROGRAM: Access Code: [email or web address] URL: [email or web address] Date: 11/21/2022 Prepared by: Harriet Butte  Exercises - Standing March with Counter Support  - 1-2 x daily - 5 x weekly - 1-2 sets - 5-10 reps - 3 hold - Standing Thoracic and Cervical Rotation with Reach at Wall  - 1-2 x daily - 5 x weekly - 1-2 sets - 3-5 reps - 5 hold - Mini Squat with Counter Support  - 1-2 x daily - 5 x weekly - 1-2 sets - 5-10 reps - 3 hold - Standing Hip Abduction with Counter Support  - 1 x daily - 7 x weekly - 1-2 sets - 10 reps - Standing Hip Extension with Counter Support  - 1 x daily - 7 x weekly - 1-2 sets - 10 reps - Supine Figure 4 Piriformis Stretch  - 2-3 x daily - 7 x weekly - 1 sets - 2-3 reps - 30 secs hold - Seated Piriformis Stretch with Trunk Bend  - 1 x daily -  7 x weekly - 3-4 reps - 30-60 second hold  GOALS: Goals reviewed with patient? Yes  SHORT TERM GOALS: Target date: 12/15/22  Pt will be independent with initial HEP for improved strength, balance, transfers and gait. Baseline: Goal status: INITIAL   2.  Pt will improve gait velocity to at least 2.75 ft/sec for improved gait efficiency and performance at S level  Baseline: 2.38 ft/sec with CGA and RW (1/3) Goal status: INITIAL   3.  Pt will ambulate 500' w/LRAD and S* on for improved endurance and efficiency with gait Baseline: 460' w/RW and CGA Goal status: REVISED   4. Pt will improve BERG balance score 4 points from baseline in order to indicate dec fall risk.   Baseline: 33/56  Goal Status: Initial      LONG TERM GOALS: Target date: 01/13/2023   Pt will be independent with final HEP for improved strength, balance, transfers and gait. Baseline:  Goal status: INITIAL   2. Pt will improve gait velocity to at least 3.0 ft/sec with cane or  no AD for improved gait efficiency and performance at Supervision level  Baseline: 2.38 ft/sec with RW and CGA Goal status: INITIAL   3.  Pt will improve 5 x STS to less than or equal to 15 seconds without UE support to demonstrate improved functional strength and transfer efficiency.  Baseline: 18.63 with BUE support  Goal status: INITIAL   4.  Pt will improve normal TUG to less than or equal to 18 seconds without AD for improved functional mobility and decreased fall risk. Baseline: 15.97 sec (12/8) Goal status: INITIAL   5. Pt will ambulate 550' w/LRAD and mod I on for improved endurance and efficiency with gait Baseline: 460' w/RW and CGA Goal status: REVISED  6. Pt will improve BERG balance score 8 points from baseline in order to indicate dec fall risk.   Baseline: 33/56  Goal Status: INITIAL  7.  Pt will negotiate up/down 4 steps with single rail/cane, up/down ramp and curb and ambulate x 500' outdoors over unlevel paved surfaces at S level with cane or less in order to indicate improved community mobility.   Baseline:    Goal Status: INITIAL    ASSESSMENT:  CLINICAL IMPRESSION: Emphasis of skilled PT session on reactive balance strategies, single leg stability, BLE strength and LE coordination. Pt tolerated session well but was seemingly discouraged that his legs are feeling weaker today. Pt most challenged by rockerboard activities and relied heavily on hip strategy to maintain balance. Pt able to ambulate small distances during session without AD. Continue POC.    OBJECTIVE IMPAIRMENTS: Abnormal gait, cardiopulmonary status limiting activity, decreased activity tolerance, decreased balance, decreased endurance, decreased knowledge of use of DME, decreased mobility, difficulty walking, decreased strength, impaired perceived functional ability, impaired sensation, impaired UE functional use, improper body mechanics, and postural dysfunction.   ACTIVITY LIMITATIONS:  carrying, lifting, bending, standing, squatting, stairs, transfers, bathing, reach over head, and locomotion level  PARTICIPATION LIMITATIONS: meal prep, cleaning, laundry, driving, shopping, community activity, and yard work  PERSONAL FACTORS: Past/current experiences and 3+ comorbidities: see above  are also affecting patient's functional outcome.   REHAB POTENTIAL: Excellent  CLINICAL DECISION MAKING: Evolving/moderate complexity  EVALUATION COMPLEXITY: Moderate  PLAN:  PT FREQUENCY: 3x/week  PT DURATION: 8 weeks  PLANNED INTERVENTIONS: Therapeutic exercises, Therapeutic activity, Neuromuscular re-education, Balance training, Gait training, Patient/Family education, Self Care, Stair training, Vestibular training, DME instructions, and Aquatic Therapy  PLAN FOR NEXT SESSION:  How is HEP? dynamic standing balance with small ranges initially with focus on control, compliant surfaces when able, gait with RW with obstacles and turns, activities without AD as able.  Stretch left hip - piriformis, glut   Cruzita Lederer Yovani Cogburn, PT, DPT [facility] [address] [place], [place]  [address] Phone:  [phone] Fax:  [phone] 11/30/22, 10:57 AM

## 2022-12-03 ENCOUNTER — Encounter: Payer: Self-pay | Admitting: Neurology

## 2022-12-03 ENCOUNTER — Encounter: Payer: Self-pay | Admitting: Physical Therapy

## 2022-12-03 ENCOUNTER — Ambulatory Visit: Payer: Medicare PPO | Admitting: Physical Therapy

## 2022-12-03 ENCOUNTER — Ambulatory Visit: Payer: Medicare PPO | Admitting: Occupational Therapy

## 2022-12-03 DIAGNOSIS — R2689 Other abnormalities of gait and mobility: Secondary | ICD-10-CM

## 2022-12-03 DIAGNOSIS — M6281 Muscle weakness (generalized): Secondary | ICD-10-CM

## 2022-12-03 DIAGNOSIS — R2681 Unsteadiness on feet: Secondary | ICD-10-CM

## 2022-12-03 DIAGNOSIS — R208 Other disturbances of skin sensation: Secondary | ICD-10-CM | POA: Diagnosis not present

## 2022-12-03 DIAGNOSIS — R278 Other lack of coordination: Secondary | ICD-10-CM

## 2022-12-03 NOTE — Therapy (Signed)
OUTPATIENT OCCUPATIONAL THERAPY NEURO EVALUATION  Patient Name: Joe Greene MRN: [id number] DOB:1951-08-07, 72 y.o., male Today's Date: 12/03/2022  PCP: Dr. Gena Fray REFERRING PROVIDER: Dr. Gena Fray  END OF SESSION:  OT End of Session - 12/03/22 1139     Visit Number 1    Number of Visits 25    Date for OT Re-Evaluation 02/04/23    Authorization Type [employer]    Authorization - Visit Number 1    Progress Note Due on Visit 10    OT Start Time 1103    OT Stop Time 1137    OT Time Calculation (min) 34 min             Past Medical History:  Diagnosis Date   AAA (abdominal aortic aneurysm) ([place])    Cataract    forming    Guillain Barr syndrome ([place])    Past Surgical History:  Procedure Laterality Date   FOOT SURGERY Right    HERNIA REPAIR Right    IR FLUORO GUIDE CV LINE RIGHT  10/25/2022   IR US GUIDE VASC ACCESS RIGHT  10/25/2022   KNEE ARTHROSCOPY Left    WISDOM TOOTH EXTRACTION     Patient Active Problem List   Diagnosis Date Noted   Constipation 11/15/2022   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[place]) 11/02/2022   Acute sensory neuropathy 10/25/2022   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[place]) 10/18/2022   B12 deficiency 10/18/2022   Gait abnormality 10/18/2022   Pre-diabetes 09/25/2022   AAA (abdominal aortic aneurysm) ([place]) 09/24/2022   Essential hypertension 09/24/2022   Iron deficiency 04/04/2022   Hypertriglyceridemia 04/03/2022   Coronary artery calcification 01/18/2022   Candidal intertrigo    Emphysema of lung ([place]) 07/26/2021   Aortic atherosclerosis ([place]) 07/26/2021   Internal hemorrhoids 01/23/2021   Diverticula of colon 01/23/2021   Eczema 01/23/2021   Actinic keratoses 01/23/2021   Tobacco use 09/02/2020    ONSET DATE: 11/07/23  REFERRING DIAG:  Diagnosis  G35 (ICD-10-CM) - Multiple sclerosis  G61.0 (ICD-10-CM) - Guillain-Barre syndrome  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 (ICD-10-CM) - Other hereditary and idiopathic neuropathies    THERAPY DIAG:   Other lack of coordination - Plan: Ot plan of care cert/re-cert  Muscle weakness (generalized) - Plan: Ot plan of care cert/re-cert  Unsteadiness on feet - Plan: Ot plan of care cert/re-cert  Other abnormalities of gait and mobility - Plan: Ot plan of care cert/re-cert  Other disturbances of skin sensation - Plan: Ot plan of care cert/re-cert  Rationale for Evaluation and Treatment: Rehabilitation  SUBJECTIVE:   SUBJECTIVE STATEMENT: Pt reports difficulty using hands Pt accompanied by: self  PERTINENT HISTORY: Pt is a 72 yo M who presents with 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 PMH sig for GBS, HTN, COPD, MS.  Patient is in today for follow-up from his recent hospitalization. Joe Greene was admitted at [facility] from 11/13-11/20/2023. He presented with right leg weakness and ascending numbness of his fingers and toes. He had extensive work up. 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 He was treated with a course of IVIG. He underwent some initial physical therapy. Due to some logistical challenges, the decision was made to discharge him to home, rather than move to inpatient rehab.   Since returning home, Joe Greene notes he had fluctuating weakness in the legs. Therefore returned to [place] and was hospitalized with new diagnosis of AIDP on 11/02/22, went to inpatient rehab and then returned home 11/10/22.   Other issues were identified during  his hospital stay, including some hyperglycemia with an A1c of 6.5%, a mildly high TSH with a normal T4, elevated blood pressures, and some dyspnea issues. His blood pressure was high enough that he was started on losartan 25 mg daily. He has been trying to quit smoking now in light of his current issues. Additionally, the hospital noted his infrarenal AAA.    PRECAUTIONS: Fall  WEIGHT BEARING RESTRICTIONS: No  PAIN:  Are you having pain? No  FALLS: Has patient fallen in last 6 months?  No  LIVING ENVIRONMENT: Lives with: lives with their family and lives with their spouse Lives in: House/apartment Stairs: No Has following equipment at home: Ramped entry  PLOF: Independent  PATIENT GOALS: Get back to using hands normally  OBJECTIVE:   HAND DOMINANCE: Right  ADLs: Overall ADLs: increased time required Transfers/ambulation related to ADLs:uses walker  Eating: mod I Grooming: mod I UB Dressing: set up LB Dressing: set up Toileting: mod I with walker Bathing: walk in shower with grab bar has 3 in 1 but does not use, mod A  Tub Shower transfers: min A Equipment:  3 in 1  IADLs: Shopping: dependent Light housekeeping: dependent Meal Prep: does not Buyer, retail mobility: supervision Medication management: pt's wife assists Landscape architect: pt assists but difficulty writing Handwriting: 100% legible  MOBILITY STATUS:  uses walker  POSTURE COMMENTS:  No Significant postural limitations    FUNCTIONAL OUTCOME MEASURES: Upper Extremity Functional Scale (UEFS): 40% impairment  UPPER EXTREMITY ROM:  WFLS for bilateral UE's    UPPER EXTREMITY MMT:     MMT Right eval Left eval  Shoulder flexion 4+/5 4+/5  Shoulder abduction    Shoulder adduction    Shoulder extension    Shoulder internal rotation    Shoulder external rotation    Middle trapezius    Lower trapezius    Elbow flexion 4+/5 4+/5  Elbow extension 4+/5 4+/5  Wrist flexion    Wrist extension    Wrist ulnar deviation    Wrist radial deviation    Wrist pronation    Wrist supination    (Blank rows = not tested)  HAND FUNCTION: Grip strength: Right: 73.1 lbs; Left: 83.3 lbs  COORDINATION: 9 Hole Peg test: Right: 36.41 sec; Left: 28.94 sec Box and Blocks:  Right 49blocks, Left 50blocks  SENSATION: Light touch: Impaired  for hands   EDEMA: n/a    COGNITION: Overall cognitive status: Within functional limits for tasks assessed however a little impulsive with  testing  VISION: Subjective report: denies visual changes Baseline vision: Wears contacts     OBSERVATIONS: Pt appears anxious and moves quickly during testing.   TODAY'S TREATMENT:                                                                                                                              DATE: 12/03/22 n/a eval only   PATIENT EDUCATION: Education details: role of OT, potential goals  Person educated: Patient Education method: Explanation Education comprehension: verbalized understanding  HOME EXERCISE PROGRAM: N/A   GOALS: Goals reviewed with patient? Yes  SHORT TERM GOALS: Target date: 01/02/23  I with HEP  Goal status: INITIAL  2.  Pt will perform all basic ADLS modified independently  Goal status: INITIAL  3.  Pt will increase RUE grip strength by 5 lbs for increased functional use. Baseline: RUE 73.1lbs, LUE 83.3 lbs Goal status: INITIAL  4.  I with adapted strategies, and AE to increase safety and I with ADLS/ IADLs (safety for showering)  Goal status: INITIAL  5.  Pt will perform light home management modified independently demonstrating good safety awareness.  Goal status: INITIAL    LONG TERM GOALS: Target date: 02/04/23  I with updated HEP for bilateral UE strength   Goal status: INITIAL  2.  Pt will improve UEFS score to 35% impairment Baseline: 40% impairment Goal status: INITIAL  3.  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 PMH sig for GBS, HTN, COPD. Pt can benefit from skilled occupational therapy to maximize pt's safety and I with ADLS/ IADLs.  PERFORMANCE DEFICITS: in functional skills including ADLs, IADLs, coordination, dexterity,  sensation, strength, flexibility, Fine motor control, Gross motor control, mobility, balance, decreased knowledge of precautions, decreased knowledge of use of DME, and UE functional use, cognitive skills including safety awareness, and psychosocial skills including coping strategies, environmental adaptation, habits, interpersonal interactions, and routines and behaviors.   IMPAIRMENTS: are limiting patient from ADLs, IADLs, play, leisure, and social participation.   CO-MORBIDITIES: may have co-morbidities  that affects occupational performance. Patient will benefit from skilled OT to address above impairments and improve overall function.  MODIFICATION OR ASSISTANCE TO COMPLETE EVALUATION: No modification of tasks or assist necessary to complete an evaluation.  OT OCCUPATIONAL PROFILE AND HISTORY: Detailed assessment: Review of records and additional review of physical, cognitive, psychosocial history related to current functional performance.  CLINICAL DECISION MAKING: LOW - limited treatment options, no task modification necessary  REHAB POTENTIAL: Good  EVALUATION COMPLEXITY: Low    PLAN:  OT FREQUENCY: 3x/week plus eval (may drop down to 2x week depending on pt needs  OT DURATION: 8 weeks  PLANNED INTERVENTIONS: self care/ADL training, therapeutic exercise, therapeutic activity, neuromuscular re-education, manual therapy, passive range of motion, balance training, functional mobility training, splinting, ultrasound, paraffin, fluidotherapy, moist heat, cryotherapy, contrast bath, patient/family education, cognitive remediation/compensation, energy conservation, coping strategies training, DME and/or AE instructions, and Re-evaluation  RECOMMENDED OTHER SERVICES: n/a  CONSULTED AND AGREED WITH PLAN OF CARE: Patient  PLAN FOR NEXT SESSION: coordination HEP, grip strength   Peder Allums, OT 12/03/2022, 1:48 PM

## 2022-12-03 NOTE — Therapy (Signed)
OUTPATIENT PHYSICAL THERAPY NEURO THERAPY   Patient Name: Joe Greene MRN: [id number] DOB:22-Jul-1951, 72 y.o., male Today's Date: 12/03/2022   PCP: Joe Drape, MD REFERRING PROVIDER: Deatra Ina, PA-C  END OF SESSION:  PT End of Session - 12/03/22 1018     Visit Number 7    Number of Visits 25    Date for PT Re-Evaluation 01/13/23    Authorization Type [employer] M[employer]    Progress Note Due on Visit 10    PT Start Time 1016    PT Stop Time 1058    PT Time Calculation (min) 42 min    Equipment Utilized During Treatment Gait belt    Activity Tolerance Patient tolerated treatment well    Behavior During Therapy WFL for tasks assessed/performed               Past Medical History:  Diagnosis Date   AAA (abdominal aortic aneurysm) (HCC)    Cataract    forming    Guillain Barr syndrome (HCC)    Past Surgical History:  Procedure Laterality Date   FOOT SURGERY Right    HERNIA REPAIR Right    IR FLUORO GUIDE CV LINE RIGHT  10/25/2022   IR US GUIDE VASC ACCESS RIGHT  10/25/2022   KNEE ARTHROSCOPY Left    WISDOM TOOTH EXTRACTION     Patient Active Problem List   Diagnosis Date Noted   Constipation 11/15/2022   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HCC) 11/02/2022   Acute sensory neuropathy 10/25/2022   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/2022   B12 deficiency 10/18/2022   Gait abnormality 10/18/2022   Pre-diabetes 09/25/2022   AAA (abdominal aortic aneurysm) (HCC) 09/24/2022   Essential hypertension 09/24/2022   Iron deficiency 04/04/2022   Hypertriglyceridemia 04/03/2022   Coronary artery calcification 01/18/2022   Candidal intertrigo    Emphysema of lung (HCC) 07/26/2021   Aortic atherosclerosis (HCC) 07/26/2021   Internal hemorrhoids 01/23/2021   Diverticula of colon 01/23/2021   Eczema 01/23/2021   Actinic keratoses 01/23/2021   Tobacco use 09/02/2020    ONSET DATE: 11/06/2022 (referral date)  REFERRING DIAG: AIDP, GBS  THERAPY  DIAG:  Muscle weakness (generalized)  Unsteadiness on feet  Other abnormalities of gait and mobility  Rationale for Evaluation and Treatment: Rehabilitation  SUBJECTIVE:                                                                                                                                                                                             SUBJECTIVE STATEMENT: Pt reports his balance is getting a little bit better, hands have "gotten a good bit better",  but legs remain weak and biggest problem.  He is doing his HEP maybe 2x per day (PT celebrates this as pt has not been adequately resting).  Rating fatigue as 4/10, no falls since last visit.   Pt accompanied by: self   PERTINENT HISTORY: Patient is in today for follow-up from his recent hospitalization. [name] was admitted at [facility] from 11/13-11/20/2023. He presented with right leg weakness and ascending numbness of his fingers and toes. He had extensive work up. 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 He was treated with a course of IVIG. He underwent some initial physical therapy. Due to some logistical challenges, the decision was made to discharge him to home, rather than move to inpatient rehab.   Since returning home, [name] notes he had fluctuating weakness in the legs. Therefore returned to MD and was hospitalized with new diagnosis of AIDP on 11/02/22, went to inpatient rehab and then returned home 11/10/22.   Other issues were identified during his hospital stay, including some hyperglycemia with an A1c of 6.5%, a mildly high TSH with a normal T4, elevated blood pressures, and some dyspnea issues. His blood pressure was high enough that he was started on losartan 25 mg daily. He has been trying to quit smoking now in light of his current issues. Additionally, the hospital noted his infrarenal AAA.  PAIN:  Are you having pain? No  PRECAUTIONS: Fall  WEIGHT BEARING RESTRICTIONS:  No  FALLS: Has patient fallen in last 6 months? No  LIVING ENVIRONMENT: Lives with: lives with their family Lives in: House/apartment Stairs: No Has following equipment at home: Environmental consultant - 2 wheeled, shower chair, and Grab bars  PLOF: Requires assistive device for independence, Needs assistance with ADLs, and Needs assistance with homemaking  PATIENT GOALS: "I want to get back to normal."   OBJECTIVE:   DIAGNOSTIC FINDINGS: Most recent nerve testing revealed AIDP  COGNITION: Overall cognitive status: Within functional limits for tasks assessed   SENSATION: Light touch: Impaired B numbness and tingling up to thighs and also in B hands  COORDINATION: WFL heel to shin     POSTURE: rounded shoulders and forward head Has to sit off of L hip at times due to pain     FUNCTIONAL TESTS: FROM EVAL 5 times sit to stand: 18.63 secs with B UE support  Timed up and go (TUG): 22.63 secs without AD with min A 10 meter walk test: 2.38 ft/sec with RW Berg Balance Scale: 33/56 (down from 42/56 when left CIR)    TODAY'S TREATMENT:                NMR  Standing wide stance trunk rotations w/ 2.2lb ball progressed to 3.3 lb > Bilateral shoulder D1/D2 in wide stance CGA, pt stops midway in arc of motion to maintain balance using primarily hip strategy, requires seated rest between sets but voices he is not tired Standing tandem w/ alt LE elevated on 6" step no UE support x1 min each LE, RLE w/ increased myoclonus compared to left in stance Alt 6" step taps x30 alternating LE progressed from 2 fingertip support to none w/ single instance of forward LOB w/ left toe catch when pt not visualizing feet Standing at countertop progressing from LUE support to none SBA using body blade in anterior/posterior then lateral then overhead directions varying BOS from wide to feet together w/ pt maintaining stability throughout task Quarter progressed to full turns (using quarter turns) at countertop no  UE  support STS w/ 3.3 lb ball push to midline target x5 > far midline target x5 3.3 lb ball toss and catch progressing from wide BOS to shoulder-width BOS CGA and multidirectional tosses Laps for time 44.22 sec, 36.25 sec, 28.62 sec; pt maintains wide BOS w/ intermittent catch on tennis balls (cued to prevent this, pt tends to widen BOS w/ inc speed)                                                                                               PATIENT EDUCATION: Education details: Continue HEP at decreased frequency even taking whole rest day as desired.   Person educated: Patient Education method: Explanation and Verbal cues Education comprehension: verbalized understanding and needs further education  HOME EXERCISE PROGRAM: Access Code: [email or web address] URL: [email or web address] Date: 11/21/2022 Prepared by: Harriet Butte  Exercises - Standing March with Counter Support  - 1-2 x daily - 5 x weekly - 1-2 sets - 5-10 reps - 3 hold - Standing Thoracic and Cervical Rotation with Reach at Wall  - 1-2 x daily - 5 x weekly - 1-2 sets - 3-5 reps - 5 hold - Mini Squat with Counter Support  - 1-2 x daily - 5 x weekly - 1-2 sets - 5-10 reps - 3 hold - Standing Hip Abduction with Counter Support  - 1 x daily - 7 x weekly - 1-2 sets - 10 reps - Standing Hip Extension with Counter Support  - 1 x daily - 7 x weekly - 1-2 sets - 10 reps - Supine Figure 4 Piriformis Stretch  - 2-3 x daily - 7 x weekly - 1 sets - 2-3 reps - 30 secs hold - Seated Piriformis Stretch with Trunk Bend  - 1 x daily - 7 x weekly - 3-4 reps - 30-60 second hold  GOALS: Goals reviewed with patient? Yes  SHORT TERM GOALS: Target date: 12/15/22  Pt will be independent with initial HEP for improved strength, balance, transfers and gait. Baseline: Goal status: INITIAL   2.  Pt will improve gait velocity to at least 2.75 ft/sec for improved gait efficiency and performance at S level  Baseline: 2.38 ft/sec with CGA and RW  (1/3) Goal status: INITIAL   3.  Pt will ambulate 500' w/LRAD and S* on for improved endurance and efficiency with gait Baseline: 460' w/RW and CGA Goal status: REVISED   4. Pt will improve BERG balance score 4 points from baseline in order to indicate dec fall risk.   Baseline: 33/56  Goal Status: Initial      LONG TERM GOALS: Target date: 01/13/2023   Pt will be independent with final HEP for improved strength, balance, transfers and gait. Baseline:  Goal status: INITIAL   2. Pt will improve gait velocity to at least 3.0 ft/sec with cane or no AD for improved gait efficiency and performance at Supervision level  Baseline: 2.38 ft/sec with RW and CGA Goal status: INITIAL   3.  Pt will improve 5 x STS to less than or equal to 15 seconds without UE support to demonstrate improved functional  strength and transfer efficiency.  Baseline: 18.63 with BUE support  Goal status: INITIAL   4.  Pt will improve normal TUG to less than or equal to 18 seconds without AD for improved functional mobility and decreased fall risk. Baseline: 15.97 sec (12/8) Goal status: INITIAL   5. Pt will ambulate 550' w/LRAD and mod I on 2MWT for improved endurance and efficiency with gait Baseline: 460' w/RW and CGA Goal status: REVISED  6. Pt will improve BERG balance score 8 points from baseline in order to indicate dec fall risk.   Baseline: 33/56  Goal Status: INITIAL  7.  Pt will negotiate up/down 4 steps with single rail/cane, up/down ramp and curb and ambulate x 500' outdoors over unlevel paved surfaces at S level with cane or less in order to indicate improved community mobility.   Baseline:    Goal Status: INITIAL    ASSESSMENT:  CLINICAL IMPRESSION: Focused on addressing trunk rotation, multidimensional movements, and SLS tasks.  He continues to endorse low fatigue ratings despite verbalized need for rest breaks, muscle soreness, and dyspnea on exertion.  Will continue to address deficits  as able in coming visits.    OBJECTIVE IMPAIRMENTS: Abnormal gait, cardiopulmonary status limiting activity, decreased activity tolerance, decreased balance, decreased endurance, decreased knowledge of use of DME, decreased mobility, difficulty walking, decreased strength, impaired perceived functional ability, impaired sensation, impaired UE functional use, improper body mechanics, and postural dysfunction.   ACTIVITY LIMITATIONS: carrying, lifting, bending, standing, squatting, stairs, transfers, bathing, reach over head, and locomotion level  PARTICIPATION LIMITATIONS: meal prep, cleaning, laundry, driving, shopping, community activity, and yard work  PERSONAL FACTORS: Past/current experiences and 3+ comorbidities: see above  are also affecting patient's functional outcome.   REHAB POTENTIAL: Excellent  CLINICAL DECISION MAKING: Evolving/moderate complexity  EVALUATION COMPLEXITY: Moderate  PLAN:  PT FREQUENCY: 3x/week  PT DURATION: 8 weeks  PLANNED INTERVENTIONS: Therapeutic exercises, Therapeutic activity, Neuromuscular re-education, Balance training, Gait training, Patient/Family education, Self Care, Stair training, Vestibular training, DME instructions, and Aquatic Therapy  PLAN FOR NEXT SESSION: How is HEP? dynamic standing balance with small ranges initially with focus on control, compliant surfaces when able, gait with RW with obstacles and turns, activities without AD as able.  Ankle strategy, narrowed BOS/tandem.    Elease Etienne, PT, [place] [address] [place], [place]  [address] Phone:  [phone] Fax:  [phone] 12/03/22, 11:32 AM

## 2022-12-05 ENCOUNTER — Ambulatory Visit: Payer: Medicare PPO | Admitting: Rehabilitation

## 2022-12-05 ENCOUNTER — Telehealth: Payer: Self-pay | Admitting: Neurology

## 2022-12-05 ENCOUNTER — Encounter: Payer: Self-pay | Admitting: Rehabilitation

## 2022-12-05 DIAGNOSIS — M6281 Muscle weakness (generalized): Secondary | ICD-10-CM | POA: Diagnosis not present

## 2022-12-05 DIAGNOSIS — R2689 Other abnormalities of gait and mobility: Secondary | ICD-10-CM | POA: Diagnosis not present

## 2022-12-05 DIAGNOSIS — R2681 Unsteadiness on feet: Secondary | ICD-10-CM | POA: Diagnosis not present

## 2022-12-05 DIAGNOSIS — R208 Other disturbances of skin sensation: Secondary | ICD-10-CM

## 2022-12-05 DIAGNOSIS — R278 Other lack of coordination: Secondary | ICD-10-CM | POA: Diagnosis not present

## 2022-12-05 NOTE — Therapy (Signed)
OUTPATIENT PHYSICAL THERAPY NEURO THERAPY   Patient Name: Joe Greene MRN: [id number] DOB:1951/02/20, 72 y.o., male Today's Date: 12/05/2022   PCP: Herbie Drape, MD REFERRING PROVIDER: Deatra Ina, PA-C  END OF SESSION:  PT End of Session - 12/05/22 1017     Visit Number 8    Number of Visits 25    Date for PT Re-Evaluation 01/13/23    Authorization Type [employer] M[employer]    Progress Note Due on Visit 10    Equipment Utilized During Treatment Gait belt    Activity Tolerance Patient tolerated treatment well    Behavior During Therapy [facility] for tasks assessed/performed               Past Medical History:  Diagnosis Date   AAA (abdominal aortic aneurysm) (HCC)    Cataract    forming    Guillain Barr syndrome (HCC)    Past Surgical History:  Procedure Laterality Date   FOOT SURGERY Right    HERNIA REPAIR Right    IR FLUORO GUIDE CV LINE RIGHT  10/25/2022   IR US GUIDE VASC ACCESS RIGHT  10/25/2022   KNEE ARTHROSCOPY Left    WISDOM TOOTH EXTRACTION     Patient Active Problem List   Diagnosis Date Noted   Constipation 11/15/2022   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HCC) 11/02/2022   Acute sensory neuropathy 10/25/2022   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/2022   B12 deficiency 10/18/2022   Gait abnormality 10/18/2022   Pre-diabetes 09/25/2022   AAA (abdominal aortic aneurysm) (HCC) 09/24/2022   Essential hypertension 09/24/2022   Iron deficiency 04/04/2022   Hypertriglyceridemia 04/03/2022   Coronary artery calcification 01/18/2022   Candidal intertrigo    Emphysema of lung (HCC) 07/26/2021   Aortic atherosclerosis (HCC) 07/26/2021   Internal hemorrhoids 01/23/2021   Diverticula of colon 01/23/2021   Eczema 01/23/2021   Actinic keratoses 01/23/2021   Tobacco use 09/02/2020    ONSET DATE: 11/06/2022 (referral date)  REFERRING DIAG: AIDP, GBS  THERAPY DIAG:  Muscle weakness (generalized)  Unsteadiness on feet  Other  abnormalities of gait and mobility  Other disturbances of skin sensation  Rationale for Evaluation and Treatment: Rehabilitation  SUBJECTIVE:                                                                                                                                                                                             SUBJECTIVE STATEMENT: Pt reports increased weakness and numbness.  Feels more tired today.  Hip seems a little better.  Has another round of IVIG infusions next week.    Pt accompanied by: self  PERTINENT HISTORY: Patient is in today for follow-up from his recent hospitalization. Joe Greene was admitted at [facility] from 11/13-11/20/2023. He presented with right leg weakness and ascending numbness of his fingers and toes. He had extensive work up. 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 He was treated with a course of IVIG. He underwent some initial physical therapy. Due to some logistical challenges, the decision was made to discharge him to home, rather than move to inpatient rehab.   Since returning home, Joe Greene notes he had fluctuating weakness in the legs. Therefore returned to [place] and was hospitalized with new diagnosis of AIDP on 11/02/22, went to inpatient rehab and then returned home 11/10/22.   Other issues were identified during his hospital stay, including some hyperglycemia with an A1c of 6.5%, a mildly high TSH with a normal T4, elevated blood pressures, and some dyspnea issues. His blood pressure was high enough that he was started on losartan 25 mg daily. He has been trying to quit smoking now in light of his current issues. Additionally, the hospital noted his infrarenal AAA.  PAIN:  Are you having pain? No  PRECAUTIONS: Fall  WEIGHT BEARING RESTRICTIONS: No  FALLS: Has patient fallen in last 6 months? No  LIVING ENVIRONMENT: Lives with: lives with their family Lives in: House/apartment Stairs: No Has following  equipment at home: Environmental consultant - 2 wheeled, shower chair, and Grab bars  PLOF: Requires assistive device for independence, Needs assistance with ADLs, and Needs assistance with homemaking  PATIENT GOALS: "I want to get back to normal."   OBJECTIVE:   DIAGNOSTIC FINDINGS: Most recent nerve testing revealed AIDP  COGNITION: Overall cognitive status: Within functional limits for tasks assessed   SENSATION: Light touch: Impaired B numbness and tingling up to thighs and also in B hands  COORDINATION: WFL heel to shin     POSTURE: rounded shoulders and forward head Has to sit off of L hip at times due to pain     FUNCTIONAL TESTS: FROM EVAL 5 times sit to stand: 18.63 secs with B UE support  Timed up and go (TUG): 22.63 secs without AD with min A 10 meter walk test: 2.38 ft/sec with RW Berg Balance Scale: 33/56 (down from 42/56 when left CIR)    TODAY'S TREATMENT:                Therex:  Seated scitfit x 6 mins at level 3 resistance with BUEs/LEs.  Educated to keep rpms no higher than 80's to avoid increased fatigue.  Sit<>stand without UE support from standard arm chair x 10 reps with min cues for increased forward trunk lean.  Forward step ups to 4" aerobic step x 10 reps leading with RLE then 10 reps with LLE, seated rest break following this due to fatigue to 7/10.  Lateral step ups x 10 reps each direction with light UE support.  Again, seated rest break following task due to increased fatigue.  With 3lb ankle weight on each LE performed standing knee flex x 10 reps BLEs (moderate difficulty on LLE), standing hip abd x 10 reps with light UE support.  Seated rest break following.    NMR: Standing in // bars for intermittent UE support.  Standing on foam airex with feet apart EO maintaining balance without UE support x 20 secs>alt UE flex x 10 reps, adding in opposite head motions (horizontally) of arm motions x 10 reps.  Had pt perform seated rest following this task due  to noted  increase in labored breathing.  Reports 6-7/10 fatigue.  Standing on foam airex alt cone taps x 20 reps with light support.  RLE stepping to multiple targets (ant, ant/lat, lat, and post/lat) x 4 rounds then same on LLE x 4 rounds with intermittent single UE support at times esp when stepping LLE.  Backwards stepping also noted to be quite difficult this session with intermittent min/guard needed due to mild LOB.   Briefly discussed diet during session and provided ideas for decreasing carbs and sugar but still being satisfied.  He has tried cauliflower rice and spaghetti squash in the place of rice and pasta.                                                                                            PATIENT EDUCATION: Education details: Continue HEP at decreased frequency even taking whole rest day as desired.   Person educated: Patient Education method: Explanation and Verbal cues Education comprehension: verbalized understanding and needs further education  HOME EXERCISE PROGRAM: Access Code: [email or web address] URL: [email or web address] Date: 11/21/2022 Prepared by: Cameron Sprang  Exercises - Standing March with Counter Support  - 1-2 x daily - 5 x weekly - 1-2 sets - 5-10 reps - 3 hold - Standing Thoracic and Cervical Rotation with Reach at Wall  - 1-2 x daily - 5 x weekly - 1-2 sets - 3-5 reps - 5 hold - Mini Squat with Counter Support  - 1-2 x daily - 5 x weekly - 1-2 sets - 5-10 reps - 3 hold - Standing Hip Abduction with Counter Support  - 1 x daily - 7 x weekly - 1-2 sets - 10 reps - Standing Hip Extension with Counter Support  - 1 x daily - 7 x weekly - 1-2 sets - 10 reps - Supine Figure 4 Piriformis Stretch  - 2-3 x daily - 7 x weekly - 1 sets - 2-3 reps - 30 secs hold - Seated Piriformis Stretch with Trunk Bend  - 1 x daily - 7 x weekly - 3-4 reps - 30-60 second hold  GOALS: Goals reviewed with patient? Yes  SHORT TERM GOALS: Target date: 12/15/22  Pt will be  independent with initial HEP for improved strength, balance, transfers and gait. Baseline: Goal status: INITIAL   2.  Pt will improve gait velocity to at least 2.75 ft/sec for improved gait efficiency and performance at S level  Baseline: 2.38 ft/sec with CGA and RW (1/3) Goal status: INITIAL   3.  Pt will ambulate 500' w/LRAD and S* on 2MWT for improved endurance and efficiency with gait Baseline: 460' w/RW and CGA Goal status: REVISED   4. Pt will improve BERG balance score 4 points from baseline in order to indicate dec fall risk.   Baseline: 33/56  Goal Status: Initial      LONG TERM GOALS: Target date: 01/13/2023   Pt will be independent with final HEP for improved strength, balance, transfers and gait. Baseline:  Goal status: INITIAL   2. Pt will improve gait velocity to at least 3.0 ft/sec with cane or no AD for improved gait  efficiency and performance at Supervision level  Baseline: 2.38 ft/sec with RW and CGA Goal status: INITIAL   3.  Pt will improve 5 x STS to less than or equal to 15 seconds without UE support to demonstrate improved functional strength and transfer efficiency.  Baseline: 18.63 with BUE support  Goal status: INITIAL   4.  Pt will improve normal TUG to less than or equal to 18 seconds without AD for improved functional mobility and decreased fall risk. Baseline: 15.97 sec (12/8) Goal status: INITIAL   5. Pt will ambulate 550' w/LRAD and mod I on for improved endurance and efficiency with gait Baseline: 460' w/RW and CGA Goal status: REVISED  6. Pt will improve BERG balance score 8 points from baseline in order to indicate dec fall risk.   Baseline: 33/56  Goal Status: INITIAL  7.  Pt will negotiate up/down 4 steps with single rail/cane, up/down ramp and curb and ambulate x 500' outdoors over unlevel paved surfaces at S level with cane or less in order to indicate improved community mobility.   Baseline:    Goal Status: INITIAL     ASSESSMENT:  CLINICAL IMPRESSION: Pt did seem more fatigued today vs previous sessions but tolerated exercises well.  PT did enforce more seated rest breaks when he was noted to have more dyspnea.  Gets another round of IVIG next week.  Continue POC to address deficits.    OBJECTIVE IMPAIRMENTS: Abnormal gait, cardiopulmonary status limiting activity, decreased activity tolerance, decreased balance, decreased endurance, decreased knowledge of use of DME, decreased mobility, difficulty walking, decreased strength, impaired perceived functional ability, impaired sensation, impaired UE functional use, improper body mechanics, and postural dysfunction.   ACTIVITY LIMITATIONS: carrying, lifting, bending, standing, squatting, stairs, transfers, bathing, reach over head, and locomotion level  PARTICIPATION LIMITATIONS: meal prep, cleaning, laundry, driving, shopping, community activity, and yard work  PERSONAL FACTORS: Past/current experiences and 3+ comorbidities: see above  are also affecting patient's functional outcome.   REHAB POTENTIAL: Excellent  CLINICAL DECISION MAKING: Evolving/moderate complexity  EVALUATION COMPLEXITY: Moderate  PLAN:  PT FREQUENCY: 3x/week  PT DURATION: 8 weeks  PLANNED INTERVENTIONS: Therapeutic exercises, Therapeutic activity, Neuromuscular re-education, Balance training, Gait training, Patient/Family education, Self Care, Stair training, Vestibular training, DME instructions, and Aquatic Therapy  PLAN FOR NEXT SESSION: How is HEP? dynamic standing balance with small ranges initially with focus on control, compliant surfaces when able, gait with RW with obstacles and turns, activities without AD as able.  Ankle strategy, narrowed BOS/tandem.    Harriet Butte, PT, MPT [facility] [address] [place], [place], [address] Phone: [phone]   Fax:  [phone] 12/05/22, 10:17 AM

## 2022-12-05 NOTE — Telephone Encounter (Signed)
Called pt to confirm appt 12/11/2022. Pt states his legs have been more numb as of yesterday morning and his balance has gotten worse. Wanted Dr. Krista Blue to know before his appt next week.

## 2022-12-07 ENCOUNTER — Ambulatory Visit: Payer: Medicare PPO | Admitting: Physical Therapy

## 2022-12-07 ENCOUNTER — Encounter: Payer: Self-pay | Admitting: Physical Therapy

## 2022-12-07 ENCOUNTER — Encounter: Payer: Self-pay | Admitting: Family Medicine

## 2022-12-07 ENCOUNTER — Encounter: Payer: Self-pay | Admitting: Neurology

## 2022-12-07 ENCOUNTER — Ambulatory Visit: Payer: Medicare PPO | Admitting: Occupational Therapy

## 2022-12-07 VITALS — BP 120/69 | HR 68

## 2022-12-07 DIAGNOSIS — R208 Other disturbances of skin sensation: Secondary | ICD-10-CM | POA: Diagnosis not present

## 2022-12-07 DIAGNOSIS — R278 Other lack of coordination: Secondary | ICD-10-CM | POA: Diagnosis not present

## 2022-12-07 DIAGNOSIS — R2681 Unsteadiness on feet: Secondary | ICD-10-CM | POA: Diagnosis not present

## 2022-12-07 DIAGNOSIS — R2689 Other abnormalities of gait and mobility: Secondary | ICD-10-CM | POA: Diagnosis not present

## 2022-12-07 DIAGNOSIS — M6281 Muscle weakness (generalized): Secondary | ICD-10-CM

## 2022-12-07 MED ORDER — NICOTINE 14 MG/24HR TD PT24: 14.0000 mg | MEDICATED_PATCH | Freq: Every day | TRANSDERMAL | 0 refills | Status: DC

## 2022-12-07 NOTE — Therapy (Signed)
OUTPATIENT PHYSICAL THERAPY NEURO THERAPY   Patient Name: Joe Greene MRN: [id number] DOB:10-17-1951, 72 y.o., male Today's Date: 12/07/2022   PCP: Herbie Drape, MD REFERRING PROVIDER: Deatra Ina, PA-C  END OF SESSION:  PT End of Session - 12/07/22 1111     Visit Number 9    Number of Visits 25    Date for PT Re-Evaluation 01/13/23    Authorization Type [employer]    Progress Note Due on Visit 10    PT Start Time 1105    PT Stop Time 1153    PT Time Calculation (min) 48 min    Equipment Utilized During Treatment Gait belt    Activity Tolerance Patient tolerated treatment well    Behavior During Therapy WFL for tasks assessed/performed               Past Medical History:  Diagnosis Date   AAA (abdominal aortic aneurysm) (HCC)    Cataract    forming    Guillain Barr syndrome (HCC)    Past Surgical History:  Procedure Laterality Date   FOOT SURGERY Right    HERNIA REPAIR Right    IR FLUORO GUIDE CV LINE RIGHT  10/25/2022   IR US GUIDE VASC ACCESS RIGHT  10/25/2022   KNEE ARTHROSCOPY Left    WISDOM TOOTH EXTRACTION     Patient Active Problem List   Diagnosis Date Noted   Constipation 11/15/2022   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HCC) 11/02/2022   Acute sensory neuropathy 10/25/2022   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/2022   B12 deficiency 10/18/2022   Gait abnormality 10/18/2022   Pre-diabetes 09/25/2022   AAA (abdominal aortic aneurysm) (HCC) 09/24/2022   Essential hypertension 09/24/2022   Iron deficiency 04/04/2022   Hypertriglyceridemia 04/03/2022   Coronary artery calcification 01/18/2022   Candidal intertrigo    Emphysema of lung (HCC) 07/26/2021   Aortic atherosclerosis (HCC) 07/26/2021   Internal hemorrhoids 01/23/2021   Diverticula of colon 01/23/2021   Eczema 01/23/2021   Actinic keratoses 01/23/2021   Tobacco use 09/02/2020    ONSET DATE: 11/06/2022 (referral date)  REFERRING DIAG: AIDP, GBS  THERAPY  DIAG:  Muscle weakness (generalized)  Unsteadiness on feet  Other abnormalities of gait and mobility  Other disturbances of skin sensation  Other lack of coordination  Rationale for Evaluation and Treatment: Rehabilitation  SUBJECTIVE:                                                                                                                                                                                             SUBJECTIVE STATEMENT: Pt woke up early Tuesday morning  and both legs, mostly the left, were very achy and this has progressed up his legs stopping just below the groin level.  He states the left leg intermittently feels as if it is completely numb in this same distribution and he feels his control of the leg has declined.  He denies falls or near falls.  He voices frustration about trying to get into the neurologist sooner because the infusion is only helping for a limited time.  His next infusion is Monday 1/29.  Pt accompanied by: self   PERTINENT HISTORY: Patient is in today for follow-up from his recent hospitalization. Mr. Cochrane was admitted at [facility] from 11/13-11/20/2023. He presented with right leg weakness and ascending numbness of his fingers and toes. He had extensive work up. 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 He was treated with a course of IVIG. He underwent some initial physical therapy. Due to some logistical challenges, the decision was made to discharge him to home, rather than move to inpatient rehab.   Since returning home, Mr. Prindle notes he had fluctuating weakness in the legs. Therefore returned to MD and was hospitalized with new diagnosis of AIDP on 11/02/22, went to inpatient rehab and then returned home 11/10/22.   Other issues were identified during his hospital stay, including some hyperglycemia with an A1c of 6.5%, a mildly high TSH with a normal T4, elevated blood pressures, and some dyspnea issues. His blood  pressure was high enough that he was started on losartan 25 mg daily. He has been trying to quit smoking now in light of his current issues. Additionally, the hospital noted his infrarenal AAA.  PAIN:  Are you having pain? No-he is unsure if these sensations are pain or just annoyance but rates 8/10 last night and 5-6/10 at current.  PRECAUTIONS: Fall  WEIGHT BEARING RESTRICTIONS: No  FALLS: Has patient fallen in last 6 months? No  LIVING ENVIRONMENT: Lives with: lives with their family Lives in: House/apartment Stairs: No Has following equipment at home: Environmental consultant - 2 wheeled, shower chair, and Grab bars  PLOF: Requires assistive device for independence, Needs assistance with ADLs, and Needs assistance with homemaking  PATIENT GOALS: "I want to get back to normal."   OBJECTIVE:   DIAGNOSTIC FINDINGS: Most recent nerve testing revealed AIDP  COGNITION: Overall cognitive status: Within functional limits for tasks assessed   SENSATION: Light touch: Impaired B numbness and tingling up to thighs and also in B hands  COORDINATION: WFL heel to shin     POSTURE: rounded shoulders and forward head Has to sit off of L hip at times due to pain     FUNCTIONAL TESTS: FROM EVAL 5 times sit to stand: 18.63 secs with B UE support  Timed up and go (TUG): 22.63 secs without AD with min A 10 meter walk test: 2.38 ft/sec with RW Berg Balance Scale: 33/56 (down from 42/56 when left CIR)    TODAY'S TREATMENT:               PT reassessed sensation and MMT of BLE:   -distinguishes light touch same at at eval, pt continues to endorse that "it just feels numb" in a different way than before.   -LLE:  hip flexion 4/5, knee ext 4-/5, hamstrings 4+/5, DF 4+/5 -RLE:  hip flexion 4-/5, knee ext 4/5, hamstrings 4+/5, DF 4-/5  -Attempted isometric work on [employer] w/ pt verbalizing intolerance so regressed to [employer] x80min BLE only for neural priming and reciprocal  mobility progressing to level 4.0.   Seated rest following task. -Quadruped red theraball rollout planks 2x5 w/ minA to facilitate glute engagement on return to tall kneeling/quadruped start position; edu on pursed lip breathing as pt is unable to breathe in through nose adequately -forward lunge to green dynadisc alt LE x10 each w/ pt having significant left lateral LOB during left step, using LUE support and CGA-minA -Forward lean to chair seat w/ 3.3lb wt ball > lateral arm rest taps for multi-planar movement using 3.3lb ball w/ pt have several LOB requiring min-modA to correct                                                                               PATIENT EDUCATION: Education details: Continue HEP at decreased frequency even taking whole rest day as desired.  Discussed relapsing nature of patient's symptoms as he describes vs expected GBS recovery and general fluctuation of all rehab and continuing to verbalize his concerns to his MD for further clarification.  Edu on emergent s/s requiring trip to ED and following up with MD when having infusion Monday to see if he should be seen by neurologist sooner. Person educated: Patient Education method: Explanation and Verbal cues Education comprehension: verbalized understanding and needs further education  HOME EXERCISE PROGRAM: Access Code: [email or web address] URL: [email or web address] Date: 11/21/2022 Prepared by: Cameron Sprang  Exercises - Standing March with Counter Support  - 1-2 x daily - 5 x weekly - 1-2 sets - 5-10 reps - 3 hold - Standing Thoracic and Cervical Rotation with Reach at Wall  - 1-2 x daily - 5 x weekly - 1-2 sets - 3-5 reps - 5 hold - Mini Squat with Counter Support  - 1-2 x daily - 5 x weekly - 1-2 sets - 5-10 reps - 3 hold - Standing Hip Abduction with Counter Support  - 1 x daily - 7 x weekly - 1-2 sets - 10 reps - Standing Hip Extension with Counter Support  - 1 x daily - 7 x weekly - 1-2 sets - 10 reps - Supine Figure 4 Piriformis Stretch  - 2-3  x daily - 7 x weekly - 1 sets - 2-3 reps - 30 secs hold - Seated Piriformis Stretch with Trunk [place]  - 1 x daily - 7 x weekly - 3-4 reps - 30-60 second hold  GOALS: Goals reviewed with patient? Yes  SHORT TERM GOALS: Target date: 12/15/22  Pt will be independent with initial HEP for improved strength, balance, transfers and gait. Baseline: Goal status: INITIAL   2.  Pt will improve gait velocity to at least 2.75 ft/sec for improved gait efficiency and performance at S level  Baseline: 2.38 ft/sec with CGA and RW (1/3) Goal status: INITIAL   3.  Pt will ambulate 500' w/LRAD and S* on 2MWT for improved endurance and efficiency with gait Baseline: 460' w/RW and CGA Goal status: REVISED   4. Pt will improve BERG balance score 4 points from baseline in order to indicate dec fall risk.   Baseline: 33/56  Goal Status: Initial      LONG TERM GOALS: Target date: 01/13/2023   Pt will be independent with final HEP for  improved strength, balance, transfers and gait. Baseline:  Goal status: INITIAL   2. Pt will improve gait velocity to at least 3.0 ft/sec with cane or no AD for improved gait efficiency and performance at Supervision level  Baseline: 2.38 ft/sec with RW and CGA Goal status: INITIAL   3.  Pt will improve 5 x STS to less than or equal to 15 seconds without UE support to demonstrate improved functional strength and transfer efficiency.  Baseline: 18.63 with BUE support  Goal status: INITIAL   4.  Pt will improve normal TUG to less than or equal to 18 seconds without AD for improved functional mobility and decreased fall risk. Baseline: 15.97 sec (12/8) Goal status: INITIAL   5. Pt will ambulate 550' w/LRAD and mod I on for improved endurance and efficiency with gait Baseline: 460' w/RW and CGA Goal status: REVISED  6. Pt will improve BERG balance score 8 points from baseline in order to indicate dec fall risk.   Baseline: 33/56  Goal Status: INITIAL  7.  Pt will  negotiate up/down 4 steps with single rail/cane, up/down ramp and curb and ambulate x 500' outdoors over unlevel paved surfaces at S level with cane or less in order to indicate improved community mobility.   Baseline:    Goal Status: INITIAL    ASSESSMENT:  CLINICAL IMPRESSION: Reassessed pt symptoms this visit without cause for increased concern at present as presentation is consistent with prior eval.  He continues to struggle with unsupported tasks and has ongoing verbalized frustrations.  He continues to benefit from skilled PT to progress dynamic stability, address ambulation w/ LRAD as safe and able, and improve cardiovascular endurance and fatigue response to activity.   OBJECTIVE IMPAIRMENTS: Abnormal gait, cardiopulmonary status limiting activity, decreased activity tolerance, decreased balance, decreased endurance, decreased knowledge of use of DME, decreased mobility, difficulty walking, decreased strength, impaired perceived functional ability, impaired sensation, impaired UE functional use, improper body mechanics, and postural dysfunction.   ACTIVITY LIMITATIONS: carrying, lifting, bending, standing, squatting, stairs, transfers, bathing, reach over head, and locomotion level  PARTICIPATION LIMITATIONS: meal prep, cleaning, laundry, driving, shopping, community activity, and yard work  PERSONAL FACTORS: Past/current experiences and 3+ comorbidities: see above  are also affecting patient's functional outcome.   REHAB POTENTIAL: Excellent  CLINICAL DECISION MAKING: Evolving/moderate complexity  EVALUATION COMPLEXITY: Moderate  PLAN:  PT FREQUENCY: 3x/week  PT DURATION: 8 weeks  PLANNED INTERVENTIONS: Therapeutic exercises, Therapeutic activity, Neuromuscular re-education, Balance training, Gait training, Patient/Family education, Self Care, Stair training, Vestibular training, DME instructions, and Aquatic Therapy  PLAN FOR NEXT SESSION: How is HEP? dynamic standing  balance with small ranges initially with focus on control, compliant surfaces when able, gait with RW with obstacles and turns, activities without AD as able.  Ankle strategy, narrowed BOS/tandem.    Camille Bal, PT, DPT [facility] [address] [place], [place], [address] Phone: [phone]   Fax:  [phone] 12/07/22, 2:42 PM

## 2022-12-10 ENCOUNTER — Encounter: Payer: Medicare PPO | Admitting: Occupational Therapy

## 2022-12-10 ENCOUNTER — Ambulatory Visit: Payer: Medicare PPO | Admitting: Physical Therapy

## 2022-12-10 DIAGNOSIS — G610 Guillain-Barre syndrome: Secondary | ICD-10-CM | POA: Diagnosis not present

## 2022-12-11 ENCOUNTER — Ambulatory Visit (INDEPENDENT_AMBULATORY_CARE_PROVIDER_SITE_OTHER): Payer: Medicare PPO | Admitting: Neurology

## 2022-12-11 ENCOUNTER — Encounter: Payer: Self-pay | Admitting: Neurology

## 2022-12-11 ENCOUNTER — Other Ambulatory Visit: Payer: Self-pay | Admitting: Neurology

## 2022-12-11 ENCOUNTER — Ambulatory Visit
Admission: RE | Admit: 2022-12-11 | Discharge: 2022-12-11 | Disposition: A | Discharge status: Home / Self Care | Payer: Medicare PPO | Source: Ambulatory Visit | Attending: Neurology | Admitting: Neurology

## 2022-12-11 VITALS — BP 147/82 | HR 62 | Ht 70.0 in | Wt 195.0 lb

## 2022-12-11 DIAGNOSIS — R269 Unspecified abnormalities of gait and mobility: Secondary | ICD-10-CM

## 2022-12-11 DIAGNOSIS — G6181 Chronic inflammatory demyelinating polyneuritis: Secondary | ICD-10-CM | POA: Diagnosis not present

## 2022-12-11 DIAGNOSIS — M5442 Lumbago with sciatica, left side: Secondary | ICD-10-CM

## 2022-12-11 DIAGNOSIS — G610 Guillain-Barre syndrome: Secondary | ICD-10-CM | POA: Diagnosis not present

## 2022-12-11 DIAGNOSIS — M25552 Pain in left hip: Secondary | ICD-10-CM | POA: Diagnosis not present

## 2022-12-11 MED ORDER — DULOXETINE HCL 60 MG PO CPEP: 60.0000 mg | ORAL_CAPSULE | Freq: Every day | ORAL | 3 refills | Status: DC

## 2022-12-11 MED ORDER — GABAPENTIN 300 MG PO CAPS: 600.0000 mg | ORAL_CAPSULE | Freq: Three times a day (TID) | ORAL | 11 refills | Status: DC

## 2022-12-11 MED ORDER — DULOXETINE HCL 30 MG PO CPEP: 30.0000 mg | ORAL_CAPSULE | Freq: Every day | ORAL | 0 refills | Status: DC

## 2022-12-11 NOTE — Progress Notes (Signed)
Chief Complaint  Patient presents with   Follow-up    Rm 15       ASSESSMENT AND PLAN  Joe Greene is a 72 y.o. male   Chronic inflammatory demyelinating polyradiculoneuropathy  Started since early November 2023 following upper respiratory infection, loss of taste 2 weeks prior to the symptom onset  mild to moderate improvement following IVIG from November 14-18 2023, began to have worsening symptoms again since early December 2023, hospital admission again on Dec 14 for plasma Change, did help his symptoms, following that, at his best, he can ambulate without assistance, He began to have relapse hospital admission again on January 4th 2024, IVIG 2 g/kg completed on January 8, Remains symptomatic, areflexia, mild to moderate distal upper and lower extremity bilateral hip flexion weakness, gait abnormality, positive Romberg signs, Continue IVIG as outpatient, 2 g/kg loading dose on the week of December 10, 2022, then 1 g/kg every 3 weeks  Worsening gait abnormality, low back pain, radiating pain to left lower extremity, left hip,  X-ray of left hip to rule out left hip pathology  Reviewed x-ray lumbar from November 2023, lumbar spondylosis with anterior bony spur, facet hypertrophy,  MRI of lumbar spine to rule out lumbar radiculopathy  Add on Cymbalta 30 mg titrating to 60 mg daily, continue gabapentin up to 600 mg 3 times a day   Return To Clinic With in  6 weeks  DIAGNOSTIC DATA (LABS, IMAGING, TESTING) - I reviewed patient records, labs, notes, testing and imaging myself where available.  MEDICAL HISTORY:  Joe Greene, is a 72 year old male seen in request by his primary care physician Dr. Haydee Salter, to follow-up for hospital discharge for Guillain-Barr syndrome, initial evaluation October 18, 2022 accompanied by his wife  I reviewed and summarized the referring note. PMHX HTN Smoker, 2ppd, quit since Sep 23 2022.  Patient has been active all his life, began to noticed  toes and fingertips numbness tingling on September 17, 2022, prior to that, he has suffered upper respiratory infection for 2 weeks, cough, loss of taste,  By November 10, he noticed progressive numbness upper and lower extremity, and also developed gait abnormality,  Symptoms continue to progressively getting worse, leading to emergency room presentation September 24, 2022, also had intractable headache,  He was admitted with diagnosis of probable Guillain-Barr, lumbar puncture September 25, 2022 showed wbc, 4, RBC 0,  TP 89, Glucose 80,   He had 5 days of IVIG, began to notice improvement shortly afterwards, continue to make progress, to the point he can ambulate without assistance at home for few days, but since early December, he noticed regression, increased numbness, gait abnormality,   Update October 24, 2022: Patient returns for electrodiagnostic study today, which confirmed acute demyelinating polyradiculoneuropathy, there was no evidence of significant axonal loss He had symptom onset early November, received IVIG from November 14 for 5 consecutive days, he began to notice an after second and third dose, the benefit lasted about 2 weeks, at his best, he could take few steps at home without assistant,  Since beginning of December 2023, 2 weeks after IVIG infusion, he began to notice worsening bilateral lower extremity ascending numbness, weakness, now could barely take a step, sitting in wheelchair most of the time, also frequent shortness of breath episode, sometimes waking up in the middle of the night  Also complains of difficulty emptying his bladder, urinary frequency, baseline elevated heart rate, consistent with autonomic involvement  We have attempted outpatient IVIG  infusion, but due to insurance and preauthorization limitations, it needed few weeks to complete the process.  With his worsening symptoms, especially autonomic involvement, shortness of breath, continue progress of  symptoms, per patient even worse than prior to treatment level in the middle of November, will refer him to hospital for either repeat IVIG or even consider plasma exchange  UPDATE Nov 21 2022: He was admitted to hospital following his electrodiagnostic study because of his complaints of worsening ascending paresthesia, gait abnormality  Central line placed on 12/14 he was started on plasma exchange, 5 rounds, every other day, with improvement in symptoms.  He completed  NIF-32 and vital capacity 2.7 L.  Then was discharged to inpatient rehabilitation on November 02, 2022, which continues improvement, at his best, he can ambulate without assistance  Shortly after he was discharged home on November 10, 2022, he noticed regression, hospital admission again on January 4th, IVIG 2 g/kg Completed on January 8th 72, this round, he only noticed mild improvement  Discharge neurological examination showed normal upper extremity strength, mild distal upper extremity, hip flexion, distal leg weakness,  Patient complains of continued lower extremity paresthesia, weakness, gait abnormality, fingertips paresthesia, loss of dexterity of his fingers  He denies swallowing difficulty, breathing difficulty, no significant autonomic symptoms  UPDATE Dec 11 2022: Patient is accompanied by his wife at today's clinical visit, continue worsening lower extremity weakness, increased gait abnormality about 2 to 3 weeks following previous IVIG infusion, started outpatient IVIG loading 2 g/kg since January 29, 72, tolerating infusion well  He complains of slow worsening low back pain, radiating pain to left hip, lateral thigh, paresthesia, increased after bearing weight, feels very discouraged that he has recurrent symptoms even with frequent IVIG treatment, also complains of intermittent upper extremity paresthesia, extending to above wrist level  PHYSICAL EXAM:   Vitals:   12/11/22 1226  BP: (!) 147/82  Pulse: 62   Weight: 195 lb (88.5 kg)  Height: 5\' 10"  (1.778 m)   Body mass index is 27.98 kg/m.  PHYSICAL EXAMNIATION:  Gen: NAD, conversant, well nourised, well groomed                     Cardiovascular: Regular rate rhythm, no peripheral edema, warm, nontender. Eyes: Conjunctivae clear without exudates or hemorrhage Neck: Supple, no carotid bruits. Pulmonary: Clear to auscultation bilaterally   NEUROLOGICAL EXAM:  MENTAL STATUS: Speech/cognition: Awake, alert, oriented to history taking and casual conversation, normal breathing CRANIAL NERVES: CN II: Visual fields are full to confrontation. Pupils are round equal and briskly reactive to light. CN III, IV, VI: extraocular movement are normal. No ptosis. CN V: Facial sensation is intact to light touch CN VII: Face is symmetric with normal eye closure  CN VIII: Hearing is normal to causal conversation. CN IX, X: Phonation is normal. CN XI: Head turning and shoulder shrug are intact  MOTOR: Upper extremity proximal muscle strength is normal, mild finger abduction weakness, mild bilateral hip flexion, bilateral ankle dorsiflexion, toe flexion/extension weakness  REFLEXES: Areflexia  SENSORY:  Length-dependent decreased light touch, pinprick sensation to above ankle level, absent vibratory sensation to knee level, decreased toe proprioception  Absent finger vibratory sensation, preserved finger proprioception, decreased pinprick to wrist level  COORDINATION: There is no trunk or limb dysmetria noted.  GAIT/STANCE: Able to get up from seated position arm crossed, wide-based, unsteady gait, positive Romberg signs,  REVIEW OF SYSTEMS:  Full 14 system review of systems performed and notable only for  as above All other review of systems were negative.   ALLERGIES: No Known Allergies  HOME MEDICATIONS: Current Outpatient Medications  Medication Sig Dispense Refill   acetaminophen (TYLENOL) 325 MG tablet Take 1-2 tablets (325-650 mg  total) by mouth every 4 (four) hours as needed for mild pain.     aspirin EC 81 MG tablet Take 81 mg by mouth in the morning.     Cyanocobalamin (B-12 PO) Take 1 tablet by mouth daily with lunch.     DOCUSATE SODIUM PO Take 1 capsule by mouth daily as needed (constipation).     ferrous sulfate 325 (65 FE) MG EC tablet Take 325 mg by mouth daily with lunch.     Fluocinonide (LIDEX EX) Apply 1 application  topically daily as needed (rash).     gabapentin (NEURONTIN) 300 MG capsule Take 2 capsules (600 mg total) by mouth 3 (three) times daily. 180 capsule 0   ibuprofen (ADVIL) 200 MG tablet Take 400 mg by mouth daily as needed for headache or mild pain.     Multiple Vitamins-Minerals (CENTRUM SILVER) tablet Take 1 tablet by mouth daily with lunch.     nicotine (NICODERM CQ - DOSED IN MG/24 HOURS) 14 mg/24hr patch Place 1 patch (14 mg total) onto the skin daily. 28 patch 0   nicotine (NICODERM CQ - DOSED IN MG/24 HOURS) 21 mg/24hr patch Place 21 mg onto the skin daily as needed (nicotine craving).     Omega-3 Fatty Acids (FISH OIL PO) Take 1 capsule by mouth daily with lunch. When able to remember     sildenafil (VIAGRA) 100 MG tablet Take 100 mg by mouth daily as needed for erectile dysfunction.     No current facility-administered medications for this visit.    PAST MEDICAL HISTORY: Past Medical History:  Diagnosis Date   AAA (abdominal aortic aneurysm) ([place])    Cataract    forming    Guillain Barr syndrome ([place])     PAST SURGICAL HISTORY: Past Surgical History:  Procedure Laterality Date   FOOT SURGERY Right    HERNIA REPAIR Right    IR FLUORO GUIDE CV LINE RIGHT  10/25/2022   IR US GUIDE VASC ACCESS RIGHT  10/25/2022   KNEE ARTHROSCOPY Left    WISDOM TOOTH EXTRACTION      FAMILY HISTORY: Family History  Problem Relation Age of Onset   Stroke Mother 16   AAA (abdominal aortic aneurysm) Mother    Cancer Father        Prostate   Colon cancer Neg Hx    Colon polyps Neg Hx     Esophageal cancer Neg Hx    Rectal cancer Neg Hx    Stomach cancer Neg Hx     SOCIAL HISTORY: Social History   Socioeconomic History   Marital status: Married    Spouse name: Martinez Boxx   Number of children: 2   Years of education: Not on file   Highest education level: Not on file  Occupational History   Occupation: Retired    Comment: Engineer, building services- [employer]  Tobacco Use   Smoking status: Former    Packs/day: 2.00    Years: 50.00    Total pack years: 100.00    Types: Cigarettes    Quit date: 09/18/2022    Years since quitting: 0.2   Smokeless tobacco: Never  Vaping Use   Vaping Use: Never used  Substance and Sexual Activity   Alcohol use: Yes    Alcohol/week:  3.0 standard drinks of alcohol    Types: 3 Standard drinks or equivalent per week    Comment: 2 or 3 drinks per week   Drug use: Never   Sexual activity: Yes  Other Topics Concern   Not on file  Social History Narrative   Not on file   Social Determinants of Health   Financial Resource Strain: Low Risk  (11/14/2021)   Overall Financial Resource Strain (CARDIA)    Difficulty of Paying Living Expenses: Not hard at all  Food Insecurity: No Food Insecurity (11/16/2022)   Hunger Vital Sign    Worried About Running Out of Food in the Last Year: Never true    Ran Out of Food in the Last Year: Never true  Transportation Needs: No Transportation Needs (11/16/2022)   PRAPARE - Hydrologist (Medical): No    Lack of Transportation (Non-Medical): No  Physical Activity: Sufficiently Active (11/14/2021)   Exercise Vital Sign    Days of Exercise per Week: 6 days    Minutes of Exercise per Session: 60 min  Stress: No Stress Concern Present (11/14/2021)   [place]    Feeling of Stress : Not at all  Social Connections: Moderately Integrated (11/14/2021)   Social Connection and Isolation Panel [NHANES]    Frequency of  Communication with Friends and Family: Twice a week    Frequency of Social Gatherings with Friends and Family: Twice a week    Attends Religious Services: More than 4 times per year    Active Member of [employer] or Organizations: No    Attends Archivist Meetings: Never    Marital Status: Married  Human resources officer Violence: Not At Risk (11/18/2022)   Humiliation, Afraid, Rape, and Kick questionnaire    Fear of Current or Ex-Partner: No    Emotionally Abused: No    Physically Abused: No    Sexually Abused: No      Marcial Pacas, M.D. Ph.D.  [facility] [address], [place], [place] [address] Ph: [phone] Fax: [phone]  CC:  Haydee Salter, MD [place],  [place] [id number]  Haydee Salter, MD

## 2022-12-11 NOTE — Patient Instructions (Signed)
[facility]    Address: [address], [place], [place] [address]  Phone: [phone]   

## 2022-12-12 ENCOUNTER — Encounter: Payer: Medicare PPO | Admitting: Occupational Therapy

## 2022-12-12 ENCOUNTER — Telehealth: Payer: Self-pay | Admitting: Neurology

## 2022-12-12 ENCOUNTER — Ambulatory Visit: Payer: Medicare PPO | Admitting: Occupational Therapy

## 2022-12-12 ENCOUNTER — Ambulatory Visit: Payer: Medicare PPO | Admitting: Rehabilitation

## 2022-12-12 ENCOUNTER — Encounter: Payer: Self-pay | Admitting: Rehabilitation

## 2022-12-12 DIAGNOSIS — R2689 Other abnormalities of gait and mobility: Secondary | ICD-10-CM

## 2022-12-12 DIAGNOSIS — G610 Guillain-Barre syndrome: Secondary | ICD-10-CM | POA: Diagnosis not present

## 2022-12-12 DIAGNOSIS — M6281 Muscle weakness (generalized): Secondary | ICD-10-CM

## 2022-12-12 DIAGNOSIS — R208 Other disturbances of skin sensation: Secondary | ICD-10-CM

## 2022-12-12 DIAGNOSIS — R2681 Unsteadiness on feet: Secondary | ICD-10-CM | POA: Diagnosis not present

## 2022-12-12 DIAGNOSIS — R278 Other lack of coordination: Secondary | ICD-10-CM | POA: Diagnosis not present

## 2022-12-12 NOTE — Therapy (Addendum)
OUTPATIENT PHYSICAL THERAPY NEURO THERAPY and Progress Note   Patient Name: Joe Greene MRN: [id number] DOB:Mar 30, 1951, 72 y.o., male Today's Date: 12/12/2022   PCP: Joe Marker, MD REFERRING PROVIDER: Marlowe Shores, PA-C  END OF SESSION:  PT End of Session - 12/12/22 0846     Visit Number 10    Number of Visits 25    Date for PT Re-Evaluation 01/13/23    Authorization Type [employer] Wall    Progress Note Due on Visit 10    PT Start Time 0932    PT Stop Time 0930    PT Time Calculation (min) 43 min    Equipment Utilized During Treatment Gait belt    Activity Tolerance Patient tolerated treatment well    Behavior During Therapy WFL for tasks assessed/performed               Past Medical History:  Diagnosis Date   AAA (abdominal aortic aneurysm) ([place])    Cataract    forming    Guillain Barr syndrome ([place])    Past Surgical History:  Procedure Laterality Date   FOOT SURGERY Right    HERNIA REPAIR Right    IR FLUORO GUIDE CV LINE RIGHT  10/25/2022   IR US GUIDE VASC ACCESS RIGHT  10/25/2022   KNEE ARTHROSCOPY Left    WISDOM TOOTH EXTRACTION     Patient Active Problem List   Diagnosis Date Noted   Left-sided low back pain with left-sided sciatica 12/11/2022   CIDP (chronic inflammatory demyelinating polyneuropathy) ([place]) 12/11/2022   Constipation 11/15/2022   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[place]) 11/02/2022   Acute sensory neuropathy 10/25/2022   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[place]) 10/18/2022   B12 deficiency 10/18/2022   Gait abnormality 10/18/2022   Pre-diabetes 09/25/2022   AAA (abdominal aortic aneurysm) ([place]) 09/24/2022   Essential hypertension 09/24/2022   Iron deficiency 04/04/2022   Hypertriglyceridemia 04/03/2022   Coronary artery calcification 01/18/2022   Candidal intertrigo    Emphysema of lung ([place]) 07/26/2021   Aortic atherosclerosis ([place]) 07/26/2021   Internal hemorrhoids 01/23/2021   Diverticula of colon 01/23/2021    Eczema 01/23/2021   Actinic keratoses 01/23/2021   Tobacco use 09/02/2020    ONSET DATE: 11/06/2022 (referral date)  REFERRING DIAG: AIDP, GBS  THERAPY DIAG:  Muscle weakness (generalized)  Unsteadiness on feet  Other abnormalities of gait and mobility  Other disturbances of skin sensation  Rationale for Evaluation and Treatment: Rehabilitation  SUBJECTIVE:  SUBJECTIVE STATEMENT: Pt still feels as though he is getting worse, however from a functional standpoint seems about the same.  Perhaps N/T increasing per report.  Has third IVIG infusion today   Pt accompanied by: self   PERTINENT HISTORY: Patient is in today for follow-up from his recent hospitalization. [name] was admitted at [facility] from 11/13-11/20/2023. He presented with right leg weakness and ascending numbness of his fingers and toes. He had extensive work up. 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 He was treated with a course of IVIG. He underwent some initial physical therapy. Due to some logistical challenges, the decision was made to discharge him to home, rather than move to inpatient rehab.   Since returning home, [name] notes he had fluctuating weakness in the legs. Therefore returned to MD and was hospitalized with new diagnosis of AIDP on 11/02/22, went to inpatient rehab and then returned home 11/10/22.   Other issues were identified during his hospital stay, including some hyperglycemia with an A1c of 6.5%, a mildly high TSH with a normal T4, elevated blood pressures, and some dyspnea issues. His blood pressure was high enough that he was started on losartan 25 mg daily. He has been trying to quit smoking now in light of his current issues. Additionally, the hospital noted his infrarenal  AAA.  PAIN:  Are you having pain? Does not rate, but does report increased tingling in B feet, some pain in BLEs  PRECAUTIONS: Fall  WEIGHT BEARING RESTRICTIONS: No  FALLS: Has patient fallen in last 6 months? No  LIVING ENVIRONMENT: Lives with: lives with their family Lives in: House/apartment Stairs: No Has following equipment at home: Environmental consultant - 2 wheeled, shower chair, and Grab bars  PLOF: Requires assistive device for independence, Needs assistance with ADLs, and Needs assistance with homemaking  PATIENT GOALS: "I want to get back to normal."   OBJECTIVE:   DIAGNOSTIC FINDINGS: Most recent nerve testing revealed AIDP  COGNITION: Overall cognitive status: Within functional limits for tasks assessed   SENSATION: Light touch: Impaired B numbness and tingling up to thighs and also in B hands  COORDINATION: WFL heel to shin     POSTURE: rounded shoulders and forward head Has to sit off of L hip at times due to pain     FUNCTIONAL TESTS: FROM EVAL 5 times sit to stand: 18.63 secs with B UE support  Timed up and go (TUG): 22.63 secs without AD with min A 10 meter walk test: 2.38 ft/sec with RW Berg Balance Scale: 33/56 (down from 42/56 when left CIR)    TODAY'S TREATMENT:               Seated scitfit x 6 mins at level 2 today more for neural priming and a generalized warm up prior to balance tasks.  Performed with BUEs/LEs.    NMR:  Core strengthening and stabilization as well as proximal strengthening with tall kneeling and quadruped tasks.  Tall kneeling with support of red ball performing hip abd (attempted to leave leg in air but was too difficult so had him just step knee out and back) x 10 reps on each side, alt UE flex x 10 reps, mini squats (facing mat this time) x 10 reps but without UE support with cues throughout all exercises for slow controlled movement with less ROM.    Quadruped with tactile and verbal cues for improved core activation alt LE extension  x 10 reps each side.  Tall  kneeling (each way) holding small ball extended in front of him moving laterally for improved proximal strength and core activation.    Gait around clinic due to mild hamstring cramp with RW and into/out of clinic with RW at mod I level.    Allowed pt to have many rest breaks throughout and still needs cues for performing tasks with less ROM and speed along with breathing throughout.                                                                                 PATIENT EDUCATION: Education details: Continue HEP at decreased frequency even taking whole rest day as desired.   Person educated: Patient Education method: Explanation and Verbal cues Education comprehension: verbalized understanding and needs further education  HOME EXERCISE PROGRAM: Access Code: [email or web address] URL: [email or web address] Date: 11/21/2022 Prepared by: Harriet [place]  Exercises - Standing March with Counter Support  - 1-2 x daily - 5 x weekly - 1-2 sets - 5-10 reps - 3 hold - Standing Thoracic and Cervical Rotation with Reach at Wall  - 1-2 x daily - 5 x weekly - 1-2 sets - 3-5 reps - 5 hold - Mini Squat with Counter Support  - 1-2 x daily - 5 x weekly - 1-2 sets - 5-10 reps - 3 hold - Standing Hip Abduction with Counter Support  - 1 x daily - 7 x weekly - 1-2 sets - 10 reps - Standing Hip Extension with Counter Support  - 1 x daily - 7 x weekly - 1-2 sets - 10 reps - Supine Figure 4 Piriformis Stretch  - 2-3 x daily - 7 x weekly - 1 sets - 2-3 reps - 30 secs hold - Seated Piriformis Stretch with Trunk Bend  - 1 x daily - 7 x weekly - 3-4 reps - 30-60 second hold  GOALS: Goals reviewed with patient? Yes  SHORT TERM GOALS: Target date: 12/15/22  Pt will be independent with initial HEP for improved strength, balance, transfers and gait. Baseline: Goal status: INITIAL   2.  Pt will improve gait velocity to at least 2.75 ft/sec for improved gait efficiency and performance at S  level  Baseline: 2.38 ft/sec with CGA and RW (1/3) Goal status: INITIAL   3.  Pt will ambulate 500' w/LRAD and S* on for improved endurance and efficiency with gait Baseline: 460' w/RW and CGA Goal status: REVISED   4. Pt will improve BERG balance score 4 points from baseline in order to indicate dec fall risk.   Baseline: 33/56  Goal Status: Initial      LONG TERM GOALS: Target date: 01/13/2023   Pt will be independent with final HEP for improved strength, balance, transfers and gait. Baseline:  Goal status: INITIAL   2. Pt will improve gait velocity to at least 3.0 ft/sec with cane or no AD for improved gait efficiency and performance at Supervision level  Baseline: 2.38 ft/sec with RW and CGA Goal status: INITIAL   3.  Pt will improve 5 x STS to less than or equal to 15 seconds without UE support to demonstrate improved functional strength and transfer efficiency.  Baseline: 18.63 with BUE support  Goal status: INITIAL   4.  Pt will improve normal TUG to less than or equal to 18 seconds without AD for improved functional mobility and decreased fall risk. Baseline: 15.97 sec (12/8) Goal status: INITIAL   5. Pt will ambulate 550' w/LRAD and mod I on 2MWT for improved endurance and efficiency with gait Baseline: 460' w/RW and CGA Goal status: REVISED  6. Pt will improve BERG balance score 8 points from baseline in order to indicate dec fall risk.   Baseline: 33/56  Goal Status: INITIAL  7.  Pt will negotiate up/down 4 steps with single rail/cane, up/down ramp and curb and ambulate x 500' outdoors over unlevel paved surfaces at S level with cane or less in order to indicate improved community mobility.   Baseline:    Goal Status: INITIAL   Progress Note Reporting Period 11/14/22 to 12/12/22  See note below for Objective Data and Assessment of Progress/Goals.  See last note for MMT assessment. Pt feels as though he has declined however functionally he is about the same as  eval.       ASSESSMENT:  CLINICAL IMPRESSION: Skilled session focused on proximal and core strengthening with tall kneeling, half kneeling and quadruped tasks.  Pt tolerated well with mild soreness and cues to take several breaks throughout.  Has third infusion today.    OBJECTIVE IMPAIRMENTS: Abnormal gait, cardiopulmonary status limiting activity, decreased activity tolerance, decreased balance, decreased endurance, decreased knowledge of use of DME, decreased mobility, difficulty walking, decreased strength, impaired perceived functional ability, impaired sensation, impaired UE functional use, improper body mechanics, and postural dysfunction.   ACTIVITY LIMITATIONS: carrying, lifting, bending, standing, squatting, stairs, transfers, bathing, reach over head, and locomotion level  PARTICIPATION LIMITATIONS: meal prep, cleaning, laundry, driving, shopping, community activity, and yard work  PERSONAL FACTORS: Past/current experiences and 3+ comorbidities: see above  are also affecting patient's functional outcome.   REHAB POTENTIAL: Excellent  CLINICAL DECISION MAKING: Evolving/moderate complexity  EVALUATION COMPLEXITY: Moderate  PLAN:  PT FREQUENCY: 3x/week  PT DURATION: 8 weeks  PLANNED INTERVENTIONS: Therapeutic exercises, Therapeutic activity, Neuromuscular re-education, Balance training, Gait training, Patient/Family education, Self Care, Stair training, Vestibular training, DME instructions, and Aquatic Therapy  PLAN FOR NEXT SESSION: How is HEP? dynamic standing balance with small ranges initially with focus on control, compliant surfaces when able, gait with RW with obstacles and turns, activities without AD as able.  Ankle strategy, narrowed BOS/tandem.    Cameron Sprang, PT, MPT [facility] [address] [place], [place], [address] Phone: [phone]   Fax:  [phone] 12/12/22, 11:03 AM

## 2022-12-12 NOTE — Therapy (Signed)
OUTPATIENT OCCUPATIONAL THERAPY NEURO EVALUATION  Patient Name: Joe Greene MRN: [id number] DOB:07-06-1951, 72 y.o., male Today's Date: 12/12/2022  PCP: Dr. Gena Greene REFERRING PROVIDER: Dr. Gena Greene  END OF SESSION:  OT End of Session - 12/12/22 0834     Visit Number 2    Number of Visits 25    Date for OT Re-Evaluation 02/04/23    Authorization Type [employer]    Authorization - Visit Number 2    Progress Note Due on Visit 10    OT Start Time 0805    OT Stop Time 0845    OT Time Calculation (min) 40 min    Behavior During Therapy [facility] for tasks assessed/performed              Past Medical History:  Diagnosis Date   AAA (abdominal aortic aneurysm) ([place])    Cataract    forming    Guillain Barr syndrome [facility])    Past Surgical History:  Procedure Laterality Date   FOOT SURGERY Right    HERNIA REPAIR Right    IR FLUORO GUIDE CV LINE RIGHT  10/25/2022   IR US GUIDE VASC ACCESS RIGHT  10/25/2022   KNEE ARTHROSCOPY Left    WISDOM TOOTH EXTRACTION     Patient Active Problem List   Diagnosis Date Noted   Left-sided low back pain with left-sided sciatica 12/11/2022   CIDP (chronic inflammatory demyelinating polyneuropathy) ([place]) 12/11/2022   Constipation 11/15/2022   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[place]) 11/02/2022   Acute sensory neuropathy 10/25/2022   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[place]) 10/18/2022   B12 deficiency 10/18/2022   Gait abnormality 10/18/2022   Pre-diabetes 09/25/2022   AAA (abdominal aortic aneurysm) ([place]) 09/24/2022   Essential hypertension 09/24/2022   Iron deficiency 04/04/2022   Hypertriglyceridemia 04/03/2022   Coronary artery calcification 01/18/2022   Candidal intertrigo    Emphysema of lung ([place]) 07/26/2021   Aortic atherosclerosis ([place]) 07/26/2021   Internal hemorrhoids 01/23/2021   Diverticula of colon 01/23/2021   Eczema 01/23/2021   Actinic keratoses 01/23/2021   Tobacco use 09/02/2020    ONSET DATE:  11/07/23  REFERRING DIAG:  Diagnosis  G35 (ICD-10-CM) - Multiple sclerosis  G61.0 (ICD-10-CM) - Guillain-Barre syndrome  G60.8 (ICD-10-CM) - Other hereditary and idiopathic neuropathies    THERAPY DIAG:  Muscle weakness (generalized)  Other disturbances of skin sensation  Other lack of coordination  Unsteadiness on feet  Other abnormalities of gait and mobility  Rationale for Evaluation and Treatment: Rehabilitation  SUBJECTIVE:   SUBJECTIVE STATEMENT: Pt reports he has had another bout of GBS Pt accompanied by: self  PERTINENT HISTORY: Pt is a 72 yo M who presents with 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 PMH sig for GBS, HTN, COPD, MS.  Patient is in today for follow-up from his recent hospitalization. Joe Greene was admitted at [facility] from 11/13-11/20/2023. He presented with right leg weakness and ascending numbness of his fingers and toes. He had extensive work up. 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 He was treated with a course of IVIG. He underwent some initial physical therapy. Due to some logistical challenges, the decision was made to discharge him to home, rather than move to inpatient rehab.   Since returning home, Joe Greene notes he had fluctuating weakness in the legs. Therefore returned to MD and was hospitalized with new diagnosis of AIDP on 11/02/22, went to inpatient rehab and then returned home 11/10/22.   Other issues were identified during his  hospital stay, including some hyperglycemia with an A1c of 6.5%, a mildly high TSH with a normal T4, elevated blood pressures, and some dyspnea issues. His blood pressure was high enough that he was started on losartan 25 mg daily. He has been trying to quit smoking now in light of his current issues. Additionally, the hospital noted his infrarenal AAA.    PRECAUTIONS: Fall  WEIGHT BEARING RESTRICTIONS: No  PAIN:  Are you having pain? Yes: NPRS scale:  3/10 Pain location: LE's Pain description: aching Aggravating factors: sitting Relieving factors: repositioning     FALLS: Has patient fallen in last 6 months? No  LIVING ENVIRONMENT: Lives with: lives with their family and lives with their spouse Lives in: House/apartment Stairs: No Has following equipment at home: Ramped entry  PLOF: Independent  PATIENT GOALS: Get back to using hands normally  OBJECTIVE:   HAND DOMINANCE: Right  ADLs: Overall ADLs: increased time required Transfers/ambulation related to ADLs:uses walker  Eating: mod I Grooming: mod I UB Dressing: set up LB Dressing: set up Toileting: mod I with walker Bathing: walk in shower with grab bar has 3 in 1 but does not use, mod A  Tub Shower transfers: min A Equipment:  3 in 1  IADLs: Shopping: dependent Light housekeeping: dependent Meal Prep: does not Sport and exercise psychologist mobility: supervision Medication management: pt's wife assists Doctor, hospital: pt assists but difficulty writing Handwriting: 100% legible  MOBILITY STATUS:  uses walker  POSTURE COMMENTS:  No Significant postural limitations    FUNCTIONAL OUTCOME MEASURES: Upper Extremity Functional Scale (UEFS): 40% impairment  UPPER EXTREMITY ROM:  WFLS for bilateral UE's    UPPER EXTREMITY MMT:     MMT Right eval Left eval  Shoulder flexion 4+/5 4+/5  Shoulder abduction    Shoulder adduction    Shoulder extension    Shoulder internal rotation    Shoulder external rotation    Middle trapezius    Lower trapezius    Elbow flexion 4+/5 4+/5  Elbow extension 4+/5 4+/5  Wrist flexion    Wrist extension    Wrist ulnar deviation    Wrist radial deviation    Wrist pronation    Wrist supination    (Blank rows = not tested)  HAND FUNCTION: Grip strength: Right: 73.1 lbs; Left: 83.3 lbs  12/12/22-9 hole peg test R 27.97, L 26.84 R 70.5 lbs , 72.3 lbs   COORDINATION: 9 Hole Peg test: Right: 36.41 sec; Left: 28.94 sec Box  and Blocks:  Right 49blocks, Left 50blocks  SENSATION: Light touch: Impaired  for hands   EDEMA: n/a    COGNITION: Overall cognitive status: Within functional limits for tasks assessed however a little impulsive with testing  VISION: Subjective report: denies visual changes Baseline vision: Wears contacts     OBSERVATIONS: Pt appears anxious and moves quickly during testing.   TODAY'S TREATMENT:  DATE: 12/12/22- therapist checked the following as pt reports new bought of GBS for which he is getting infusions 9 hole peg test R 27.97, L 26.84 R 70.5 lbs , 72.3 lbs   Placing grooved pegs into pegboard with RUE, mod difficulty and min v.c to avoid compensation, removing with in hand manipulation  See education below- PATIENT EDUCATION: Education details: coordination HEP see pt instructions and green putty  10-20 reps each- see pt instructions Person educated: Patient Education method: Explanation, demonstration, verbal cues Education comprehension: verbalized understanding, returned demonstration,   HOME EXERCISE PROGRAM: 12/12/22- green putty, coordiantion   GOALS: Goals reviewed with patient? Yes  SHORT TERM GOALS: Target date: 01/02/23  I with HEP  Goal status: INITIAL  2.  Pt will perform all basic ADLS modified independently  Goal status: INITIAL  3.  Pt will increase RUE grip strength by 5 lbs for increased functional use. Baseline: RUE 73.1lbs, LUE 83.3 lbs Goal status: INITIAL  4.  I with adapted strategies, and AE to increase safety and I with ADLS/ IADLs (safety for showering)  Goal status: INITIAL  5.  Pt will perform light home management modified independently demonstrating good safety awareness.  Goal status: INITIAL    LONG TERM GOALS: Target date: 02/04/23  I with updated HEP for bilateral UE strength   Goal status:  INITIAL  2.  Pt will improve UEFS score to 35% impairment Baseline: 40% impairment Goal status: INITIAL  3.  Pt will demonstrate improved RUE fine motor coordination as evidenced by decreasing 9 hole peg test score by 3 secs without drops Baseline: RUE 36.41, LUE 28.94 Goal status: INITIAL  ASSESSMENT:  CLINICAL IMPRESSION:  Pt is progressing towards goals. He demonstrates understanding of coordination HEP PERFORMANCE DEFICITS: in functional skills including ADLs, IADLs, coordination, dexterity, sensation, strength, flexibility, Fine motor control, Gross motor control, mobility, balance, decreased knowledge of precautions, decreased knowledge of use of DME, and UE functional use, cognitive skills including safety awareness, and psychosocial skills including coping strategies, environmental adaptation, habits, interpersonal interactions, and routines and behaviors.   IMPAIRMENTS: are limiting patient from ADLs, IADLs, play, leisure, and social participation.   CO-MORBIDITIES: may have co-morbidities  that affects occupational performance. Patient will benefit from skilled OT to address above impairments and improve overall function.  MODIFICATION OR ASSISTANCE TO COMPLETE EVALUATION: No modification of tasks or assist necessary to complete an evaluation.  OT OCCUPATIONAL PROFILE AND HISTORY: Detailed assessment: Review of records and additional review of physical, cognitive, psychosocial history related to current functional performance.  CLINICAL DECISION MAKING: LOW - limited treatment options, no task modification necessary  REHAB POTENTIAL: Good  EVALUATION COMPLEXITY: Low    PLAN:  OT FREQUENCY: 3x/week plus eval (may drop down to 2x week depending on pt needs  OT DURATION: 8 weeks  PLANNED INTERVENTIONS: self care/ADL training, therapeutic exercise, therapeutic activity, neuromuscular re-education, manual therapy, passive range of motion, balance training, functional  mobility training, splinting, ultrasound, paraffin, fluidotherapy, moist heat, cryotherapy, contrast bath, patient/family education, cognitive remediation/compensation, energy conservation, coping strategies training, DME and/or AE instructions, and Re-evaluation  RECOMMENDED OTHER SERVICES: n/a  CONSULTED AND AGREED WITH PLAN OF CARE: Patient  PLAN FOR NEXT SESSION:  ADL strategies,- with focus on safety, pt moves quickly, fine motor coordination, grip and pinch strength tasks Aleila Syverson, OT 12/12/2022, 8:35 AM

## 2022-12-12 NOTE — Telephone Encounter (Signed)
Referral for neurology fax to [facility]. Phone: [phone], Fax: [phone]

## 2022-12-13 DIAGNOSIS — G610 Guillain-Barre syndrome: Secondary | ICD-10-CM | POA: Diagnosis not present

## 2022-12-14 ENCOUNTER — Ambulatory Visit: Payer: Medicare PPO | Attending: Family Medicine | Admitting: Physical Therapy

## 2022-12-14 ENCOUNTER — Telehealth: Payer: Self-pay | Admitting: Neurology

## 2022-12-14 ENCOUNTER — Ambulatory Visit: Payer: Medicare PPO | Admitting: Occupational Therapy

## 2022-12-14 VITALS — BP 150/89 | HR 68

## 2022-12-14 DIAGNOSIS — R2689 Other abnormalities of gait and mobility: Secondary | ICD-10-CM | POA: Insufficient documentation

## 2022-12-14 DIAGNOSIS — R208 Other disturbances of skin sensation: Secondary | ICD-10-CM

## 2022-12-14 DIAGNOSIS — G610 Guillain-Barre syndrome: Secondary | ICD-10-CM

## 2022-12-14 DIAGNOSIS — R278 Other lack of coordination: Secondary | ICD-10-CM | POA: Diagnosis not present

## 2022-12-14 DIAGNOSIS — R2681 Unsteadiness on feet: Secondary | ICD-10-CM | POA: Diagnosis not present

## 2022-12-14 DIAGNOSIS — M6281 Muscle weakness (generalized): Secondary | ICD-10-CM

## 2022-12-14 NOTE — Telephone Encounter (Signed)
Patients wife called and said that Dr Gena Fray had told them that if they wanted to get a new referral to a different neurologist just to call back. They said the patient would like to go to [facility], Dr Gustavus Bryant Phone number for referral department [phone]

## 2022-12-14 NOTE — Telephone Encounter (Signed)
Patient notified VIA phone. Dm/cma  

## 2022-12-14 NOTE — Therapy (Signed)
OUTPATIENT PHYSICAL THERAPY NEURO THERAPY    Patient Name: Joe Greene MRN: [id number] DOB:02/12/1951, 72 y.o., male Today's Date: 12/14/2022   PCP: Arlester Marker, MD REFERRING PROVIDER: Marlowe Shores, PA-C  END OF SESSION:  PT End of Session - 12/14/22 1316     Visit Number 11    Number of Visits 25    Date for PT Re-Evaluation 01/13/23    Authorization Type [employer] [place]    Progress Note Due on Visit 10    PT Start Time 1315    PT Stop Time 8850    PT Time Calculation (min) 43 min    Equipment Utilized During Treatment Gait belt    Activity Tolerance Patient limited by fatigue    Behavior During Therapy WFL for tasks assessed/performed                Past Medical History:  Diagnosis Date   AAA (abdominal aortic aneurysm) ([place])    Cataract    forming    Guillain Barr syndrome ([place])    Past Surgical History:  Procedure Laterality Date   FOOT SURGERY Right    HERNIA REPAIR Right    IR FLUORO GUIDE CV LINE RIGHT  10/25/2022   IR US GUIDE VASC ACCESS RIGHT  10/25/2022   KNEE ARTHROSCOPY Left    WISDOM TOOTH EXTRACTION     Patient Active Problem List   Diagnosis Date Noted   Left-sided low back pain with left-sided sciatica 12/11/2022   CIDP (chronic inflammatory demyelinating polyneuropathy) ([place]) 12/11/2022   Constipation 11/15/2022   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[place]) 11/02/2022   Acute sensory neuropathy 10/25/2022   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[place]) 10/18/2022   B12 deficiency 10/18/2022   Gait abnormality 10/18/2022   Pre-diabetes 09/25/2022   AAA (abdominal aortic aneurysm) ([place]) 09/24/2022   Essential hypertension 09/24/2022   Iron deficiency 04/04/2022   Hypertriglyceridemia 04/03/2022   Coronary artery calcification 01/18/2022   Candidal intertrigo    Emphysema of lung ([place]) 07/26/2021   Aortic atherosclerosis ([place]) 07/26/2021   Internal hemorrhoids 01/23/2021   Diverticula of colon 01/23/2021   Eczema 01/23/2021    Actinic keratoses 01/23/2021   Tobacco use 09/02/2020    ONSET DATE: 11/06/2022 (referral date)  REFERRING DIAG: AIDP, GBS  THERAPY DIAG:  Muscle weakness (generalized)  Unsteadiness on feet  Other abnormalities of gait and mobility  Rationale for Evaluation and Treatment: Rehabilitation  SUBJECTIVE:  SUBJECTIVE STATEMENT: Pt presents from OT, seemingly out of breath. Pt reports fatigue is a 6-7/10. L hip is still bothering him but only when he gets in the wrong position. Did not sleep well last night, reports his legs were restless and stinging. HEP is tiring him more quickly now . Reports his legs feel stiff when he walks but he knows they are not stiff because he can move them.   Pt accompanied by: self   PERTINENT HISTORY: Patient is in today for follow-up from his recent hospitalization. Mr. Sixkiller was admitted at [facility] from 11/13-11/20/2023. He presented with right leg weakness and ascending numbness of his fingers and toes. He had extensive work up. 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 He was treated with a course of IVIG. He underwent some initial physical therapy. Due to some logistical challenges, the decision was made to discharge him to home, rather than move to inpatient rehab.   Since returning home, Mr. Scopel notes he had fluctuating weakness in the legs. Therefore returned to MD and was hospitalized with new diagnosis of AIDP on 11/02/22, went to inpatient rehab and then returned home 11/10/22.   Other issues were identified during his hospital stay, including some hyperglycemia with an A1c of 6.5%, a mildly high TSH with a normal T4, elevated blood pressures, and some dyspnea issues. His blood pressure was high enough that he was started on losartan 25 mg  daily. He has been trying to quit smoking now in light of his current issues. Additionally, the hospital noted his infrarenal AAA.  PAIN:  Are you having pain? Does not rate, but does report increased tingling in B feet, some pain in BLEs  PRECAUTIONS: Fall  WEIGHT BEARING RESTRICTIONS: No  FALLS: Has patient fallen in last 6 months? No  LIVING ENVIRONMENT: Lives with: lives with their family Lives in: House/apartment Stairs: No Has following equipment at home: Environmental consultant - 2 wheeled, shower chair, and Grab bars  PLOF: Requires assistive device for independence, Needs assistance with ADLs, and Needs assistance with homemaking  PATIENT GOALS: "I want to get back to normal."   OBJECTIVE:   DIAGNOSTIC FINDINGS: Most recent nerve testing revealed AIDP  COGNITION: Overall cognitive status: Within functional limits for tasks assessed   SENSATION: Light touch: Impaired B numbness and tingling up to thighs and also in B hands  COORDINATION: WFL heel to shin     POSTURE: rounded shoulders and forward head Has to sit off of L hip at times due to pain     FUNCTIONAL TESTS: From eval  5 times sit to stand: 18.63 secs with B UE support  Timed up and go (TUG): 22.63 secs without AD with min A 10 meter walk test: 2.38 ft/sec with RW Berg Balance Scale: 33/56 (down from 42/56 when left CIR)    TODAY'S TREATMENT:               Ther Act  STG Assessment   [facility] PT Assessment - 12/14/22 1323       Ambulation/Gait   Gait velocity 32.8' over 12s = 2.48ft/s w/RW      Sharlene Motts Balance Test   Sit to Stand Able to stand without using hands and stabilize independently    Standing Unsupported Able to stand 2 minutes with supervision   Significant muscle tremors   Sitting with Back Unsupported but Feet Supported on Floor or Stool Able to sit safely and securely 2 minutes    Stand to Sit  Sits safely with minimal use of hands    Transfers Able to transfer safely, minor use of hands    Standing  Unsupported with Eyes Closed Able to stand 10 seconds with supervision    Standing Unsupported with Feet Together Able to place feet together independently and stand for 1 minute with supervision    From Standing, Reach Forward with Outstretched Arm Reaches forward but needs supervision   11"   From Standing Position, Pick up Object from [place] to pick up shoe, needs supervision    From Standing Position, Turn to Look Behind Over each Shoulder Needs supervision when turning    Turn 360 Degrees Needs close supervision or verbal cueing   6.06s to R   Standing Unsupported, Alternately Place Feet on Step/Stool Able to complete >2 steps/needs minimal assist    Standing Unsupported, One Foot in Front Able to plae foot ahead of the other independently and hold 30 seconds    Standing on One Leg Tries to lift leg/unable to hold 3 seconds but remains standing independently    Total Score 36    Berg comment: High fall risk             Educated on diaphragmatic breathing and added to HEP (see bolded below) as pt has tendency to hold breath or rely heavily on accessory muscles to breathe                                                                                 PATIENT EDUCATION: Education details: Goal assessment, additions to HEP Person educated: Patient Education method: Explanation and Verbal cues Education comprehension: verbalized understanding and needs further education  HOME EXERCISE PROGRAM: Access Code: [email or web address] URL: [email or web address] Date: 11/21/2022 Prepared by: Cameron Sprang  Exercises - Standing March with Counter Support  - 1-2 x daily - 5 x weekly - 1-2 sets - 5-10 reps - 3 hold - Standing Thoracic and Cervical Rotation with Reach at Wall  - 1-2 x daily - 5 x weekly - 1-2 sets - 3-5 reps - 5 hold - Mini Squat with Counter Support  - 1-2 x daily - 5 x weekly - 1-2 sets - 5-10 reps - 3 hold - Standing Hip Abduction with Counter Support  - 1 x daily - 7  x weekly - 1-2 sets - 10 reps - Standing Hip Extension with Counter Support  - 1 x daily - 7 x weekly - 1-2 sets - 10 reps - Supine Figure 4 Piriformis Stretch  - 2-3 x daily - 7 x weekly - 1 sets - 2-3 reps - 30 secs hold - Seated Piriformis Stretch with Trunk Bend  - 1 x daily - 7 x weekly - 3-4 reps - 30-60 second hold - Seated Diaphragmatic Breathing  - 1 x daily - 7 x weekly - 3 sets - 10 reps - 3-5 second hold  GOALS: Goals reviewed with patient? Yes  SHORT TERM GOALS: Target date: 12/15/22  Pt will be independent with initial HEP for improved strength, balance, transfers and gait. Baseline: Goal status: MET   2.  Pt will improve gait velocity to at least 2.75 ft/sec for improved gait efficiency  and performance at S level  Baseline: 2.38 ft/sec with CGA and RW (1/3); 2.73 ft/s with RW and S*  Goal status: MET   3.  Pt will ambulate 500' w/LRAD and S* on 2MWT for improved endurance and efficiency with gait Baseline: 460' w/RW and CGA Goal status: REVISED   4. Pt will improve BERG balance score 4 points from baseline in order to indicate dec fall risk.   Baseline: 33/56; 36/56 on 2/2   Goal Status: PARTIALLY MET       LONG TERM GOALS: Target date: 01/13/2023   Pt will be independent with final HEP for improved strength, balance, transfers and gait. Baseline:  Goal status: INITIAL   2. Pt will improve gait velocity to at least 3.0 ft/sec with cane or no AD for improved gait efficiency and performance at Supervision level  Baseline: 2.38 ft/sec with RW and CGA Goal status: INITIAL   3.  Pt will improve 5 x STS to less than or equal to 15 seconds without UE support to demonstrate improved functional strength and transfer efficiency.  Baseline: 18.63 with BUE support  Goal status: INITIAL   4.  Pt will improve normal TUG to less than or equal to 18 seconds without AD for improved functional mobility and decreased fall risk. Baseline: 15.97 sec (12/8) Goal status: INITIAL    5. Pt will ambulate 550' w/LRAD and mod I on 2MWT for improved endurance and efficiency with gait Baseline: 460' w/RW and CGA Goal status: REVISED  6. Pt will improve BERG balance score 8 points from baseline in order to indicate dec fall risk.   Baseline: 33/56  Goal Status: INITIAL  7.  Pt will negotiate up/down 4 steps with single rail/cane, up/down ramp and curb and ambulate x 500' outdoors over unlevel paved surfaces at S level with cane or less in order to indicate improved community mobility.   Baseline:    Goal Status: INITIAL      ASSESSMENT:  CLINICAL IMPRESSION: Emphasis of skilled PT session on STG assessment. Pt very fatigued today and exhibited dyspnea with exertion, so added diaphragmatic breathing to HEP and provided frequent cues throughout session to maintain breathing. Pt has met 2 of 4 STGs, improving his walking speed w/S* and performing HEP regularly. Pt partially met his Merrilee Jansky goal, improving his score to 36/56 but not quite to goal level. Pt's Berg score continues to place him at high fall risk and pt had increased difficulty performing static stance tasks today due to BLE tremors. Pt demonstrates greatest difficulty with reaching, narrow BOS and single leg tasks. Continue POC.   OBJECTIVE IMPAIRMENTS: Abnormal gait, cardiopulmonary status limiting activity, decreased activity tolerance, decreased balance, decreased endurance, decreased knowledge of use of DME, decreased mobility, difficulty walking, decreased strength, impaired perceived functional ability, impaired sensation, impaired UE functional use, improper body mechanics, and postural dysfunction.   ACTIVITY LIMITATIONS: carrying, lifting, bending, standing, squatting, stairs, transfers, bathing, reach over head, and locomotion level  PARTICIPATION LIMITATIONS: meal prep, cleaning, laundry, driving, shopping, community activity, and yard work  PERSONAL FACTORS: Past/current experiences and 3+ comorbidities:  see above  are also affecting patient's functional outcome.   REHAB POTENTIAL: Excellent  CLINICAL DECISION MAKING: Evolving/moderate complexity  EVALUATION COMPLEXITY: Moderate  PLAN:  PT FREQUENCY: 3x/week  PT DURATION: 8 weeks  PLANNED INTERVENTIONS: Therapeutic exercises, Therapeutic activity, Neuromuscular re-education, Balance training, Gait training, Patient/Family education, Self Care, Stair training, Vestibular training, DME instructions, and Aquatic Therapy  PLAN FOR NEXT SESSION: 2MWT. How  is HEP? dynamic standing balance with small ranges initially with focus on control, compliant surfaces when able, gait with RW with obstacles and turns, activities without AD as able.  Ankle strategy, narrowed BOS/tandem.    Josephine Igo, PT, DPT [facility] [address] [place], [place]  [address] Phone:  [phone] Fax:  [phone] 12/14/22, 1:58 PM

## 2022-12-14 NOTE — Therapy (Signed)
OUTPATIENT OCCUPATIONAL THERAPY NEURO TREATMENT  Patient Name: Joe Greene MRN: [id number] DOB:1951/06/19, 72 y.o., male Today's Date: 12/14/2022  PCP: Dr. Gena Greene REFERRING PROVIDER: Dr. Gena Greene  END OF SESSION:  OT End of Session - 12/14/22 1227     Visit Number 3    Number of Visits 25    Date for OT Re-Evaluation 02/04/23    Authorization Type [employer]    Authorization - Visit Number 3    Progress Note Due on Visit 10    OT Start Time 6073    OT Stop Time 1311    OT Time Calculation (min) 41 min    Activity Tolerance Patient tolerated treatment well    Behavior During Therapy [facility] for tasks assessed/performed             Past Medical History:  Diagnosis Date   AAA (abdominal aortic aneurysm) ([place])    Cataract    forming    Guillain Barr syndrome [facility])    Past Surgical History:  Procedure Laterality Date   FOOT SURGERY Right    HERNIA REPAIR Right    IR FLUORO GUIDE CV LINE RIGHT  10/25/2022   IR US GUIDE VASC ACCESS RIGHT  10/25/2022   KNEE ARTHROSCOPY Left    WISDOM TOOTH EXTRACTION     Patient Active Problem List   Diagnosis Date Noted   Left-sided low back pain with left-sided sciatica 12/11/2022   CIDP (chronic inflammatory demyelinating polyneuropathy) ([place]) 12/11/2022   Constipation 11/15/2022   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[place]) 11/02/2022   Acute sensory neuropathy 10/25/2022   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[place]) 10/18/2022   B12 deficiency 10/18/2022   Gait abnormality 10/18/2022   Pre-diabetes 09/25/2022   AAA (abdominal aortic aneurysm) ([place]) 09/24/2022   Essential hypertension 09/24/2022   Iron deficiency 04/04/2022   Hypertriglyceridemia 04/03/2022   Coronary artery calcification 01/18/2022   Candidal intertrigo    Emphysema of lung ([place]) 07/26/2021   Aortic atherosclerosis ([place]) 07/26/2021   Internal hemorrhoids 01/23/2021   Diverticula of colon 01/23/2021   Eczema 01/23/2021   Actinic keratoses 01/23/2021    Tobacco use 09/02/2020    ONSET DATE: 11/07/23  REFERRING DIAG:  Diagnosis  G35 (ICD-10-CM) - Multiple sclerosis  G61.0 (ICD-10-CM) - Guillain-Barre syndrome  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 He has been working with bolts at home to help improve hand function.   Pt accompanied by: self  PERTINENT HISTORY: Pt is a 72 yo M who presents with 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 PMH sig for GBS, HTN, COPD, MS.  Patient is in today for follow-up from his recent hospitalization. Joe Greene was admitted at [facility] from 11/13-11/20/2023. He presented with right leg weakness and ascending numbness of his fingers and toes. He had extensive work up. 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 He was treated with a course of IVIG. He underwent some initial physical therapy. Due to some logistical challenges, the decision was made to discharge him to home, rather than move to inpatient rehab.   Since returning home, Joe Greene notes he had fluctuating weakness in the  legs. Therefore returned to MD and was hospitalized with new diagnosis of AIDP on 11/02/22, went to inpatient rehab and then returned home 11/10/22.   Other issues were identified during his hospital stay, including some hyperglycemia with an A1c of 6.5%, a mildly high TSH with a normal T4, elevated blood pressures, and some dyspnea issues. His blood pressure was high enough that he was started on losartan 25 mg daily. He has  been trying to quit smoking now in light of his current issues. Additionally, the hospital noted his infrarenal AAA.   PRECAUTIONS: Fall  WEIGHT BEARING RESTRICTIONS: No  PAIN:  Are you having pain? Yes: NPRS scale: 3/10 Pain location: LE's Pain description: aching Aggravating factors: sitting Relieving factors: repositioning     FALLS: Has patient fallen in last 6 months? No  LIVING ENVIRONMENT: Lives with: lives with their family and lives with their spouse Lives in: House/apartment Stairs: No Has following equipment at home: Ramped entry  PLOF: Independent  PATIENT GOALS: Get back to using hands normally  OBJECTIVE:   HAND DOMINANCE: Right  ADLs: Overall ADLs: increased time required Transfers/ambulation related to ADLs:uses walker  Eating: mod I Grooming: mod I UB Dressing: set up LB Dressing: set up Toileting: mod I with walker Bathing: walk in shower with grab bar has 3 in 1 but does not use, mod A  Tub Shower transfers: min A Equipment:  3 in 1  IADLs: Shopping: dependent Light housekeeping: dependent Meal Prep: does not Sport and exercise psychologist mobility: supervision Medication management: pt's wife assists Doctor, hospital: pt assists but difficulty writing Handwriting: 100% legible  MOBILITY STATUS:  uses walker  POSTURE COMMENTS:  No Significant postural limitations    FUNCTIONAL OUTCOME MEASURES: Upper Extremity Functional Scale (UEFS): 40% impairment  UPPER EXTREMITY ROM:  WFLS for bilateral UE's   UPPER EXTREMITY MMT:     MMT Right eval Left eval  Shoulder flexion 4+/5 4+/5  Elbow flexion 4+/5 4+/5  Elbow extension 4+/5 4+/5  (Blank rows = not tested)  HAND FUNCTION: Grip strength: Right: 73.1 lbs; Left: 83.3 lbs  12/12/22-9 hole peg test R 27.97, L 26.84 R 70.5 lbs , 72.3 lbs   COORDINATION: 9 Hole Peg test: Right: 36.41 sec; Left: 28.94 sec Box and Blocks:  Right 49blocks, Left 50blocks  SENSATION: Light touch: Impaired  for  hands   EDEMA: n/a   COGNITION: Overall cognitive status: Within functional limits for tasks assessed however a little impulsive with testing  VISION: Subjective report: denies visual changes Baseline vision: Wears contacts  OBSERVATIONS: Pt appears anxious and moves quickly during testing.   TODAY'S TREATMENT:                                                                                                                               - Therapeutic activities completed for duration as noted below including: Reviewed green putty HEP B with min cues for proper completion.  Pt completed search of  2 pennies each hand for additional challenge to strength and coordination. Pt placement of 3 large pegs into green putty x 2 sets for strength and coordination Patient used both hands to assemble small Jenga tower, and then removed 5 pieces individually until the tower fell over for fine motor coordination of affected extremity. Wrist flex and ext with tan flex bar x 2 min for strength and endurance of affected extremity  Supination with tan flex bar x 2 min for strength and endurance of affected extremity  Pronation with tan flex bar x 2 min for strength and endurance of affected extremity  Ball rolling in hand x 10 each side for in hand manipulation  Patient completed R and L grasp of ball for pick up of golf ball from table using only palm of each hand for improved grip strength.   Bolt and nut assembly and removal x 2 sets each hand for coordination  [email or web address] for coordination and improved accuracy with typing.   PATIENT EDUCATION: Education details: HEP Person educated: Patient Education method: Programmer, multimedia, demonstration, verbal cues Education comprehension: verbalized understanding, returned demonstration  HOME EXERCISE PROGRAM: 12/12/22- green putty, coordination  GOALS: Goals reviewed with patient? Yes  SHORT TERM GOALS: Target date: 01/02/23  I with HEP  Goal status:  INITIAL  2.  Pt will perform all basic ADLS modified independently  Goal status: INITIAL  3.  Pt will increase RUE grip strength by 5 lbs for increased functional use. Baseline: RUE 73.1lbs, LUE 83.3 lbs Goal status: INITIAL  4.  I with adapted strategies, and AE to increase safety and I with ADLS/ IADLs (safety for showering)  Goal status: INITIAL  5.  Pt will perform light home management modified independently demonstrating good safety awareness.  Goal status: INITIAL   LONG TERM GOALS: Target date: 02/04/23  I with updated HEP for bilateral UE strength   Goal status: INITIAL  2.  Pt will improve UEFS score to 35% impairment Baseline: 40% impairment Goal status: INITIAL  3.  Pt will demonstrate improved RUE fine motor coordination as evidenced by decreasing 9 hole peg test score by 3 secs without drops Baseline: RUE 36.41, LUE 28.94 Goal status: INITIAL  ASSESSMENT:  CLINICAL IMPRESSION:  Pt is progressing towards goals. He demonstrates understanding of coordination and putty HEPs. Good candidate for skilled OT services to maximize independence and safety with ADL and IADL completion.   PERFORMANCE DEFICITS: in functional skills including ADLs, IADLs, coordination, dexterity, sensation, strength, flexibility, Fine motor control, Gross motor control, mobility, balance, decreased knowledge of precautions, decreased knowledge of use of DME, and UE functional use, cognitive skills including safety awareness, and psychosocial skills including coping strategies, environmental adaptation, habits, interpersonal interactions, and routines and behaviors.   IMPAIRMENTS: are limiting patient from ADLs, IADLs, play, leisure, and social participation.   CO-MORBIDITIES: may have co-morbidities  that affects occupational performance. Patient will benefit from skilled OT to address above impairments and improve overall function.  REHAB POTENTIAL: Good  PLAN:  OT FREQUENCY: 3x/week  plus eval (may drop down to 2x week depending on pt needs  OT DURATION: 8 weeks  PLANNED INTERVENTIONS: self care/ADL training, therapeutic exercise, therapeutic activity, neuromuscular re-education, manual therapy, passive range of motion, balance training, functional mobility training, splinting, ultrasound, paraffin, fluidotherapy, moist heat, cryotherapy, contrast bath, patient/family education, cognitive remediation/compensation, energy conservation, coping strategies training, DME and/or AE instructions, and Re-evaluation  RECOMMENDED OTHER SERVICES: n/a  CONSULTED AND AGREED WITH PLAN OF CARE: Patient  PLAN FOR NEXT  SESSION:  ADL strategies,- with focus on safety, pt moves quickly, fine motor coordination, grip and pinch strength tasks; review TYPING.[email or web address], OT 12/14/2022, 2:55 PM

## 2022-12-14 NOTE — Addendum Note (Signed)
Addended by: Haydee Salter on: 12/14/2022 04:51 PM   Modules accepted: Orders

## 2022-12-14 NOTE — Telephone Encounter (Signed)
Mcarthur Rossetti Josem Kaufmann: [id number] exp. 12/14/22-01/13/23 sent to [facility] [phone]

## 2022-12-17 ENCOUNTER — Ambulatory Visit (INDEPENDENT_AMBULATORY_CARE_PROVIDER_SITE_OTHER): Payer: Medicare PPO

## 2022-12-17 ENCOUNTER — Ambulatory Visit: Payer: Medicare PPO | Admitting: Physical Therapy

## 2022-12-17 ENCOUNTER — Ambulatory Visit: Payer: Medicare PPO | Admitting: Occupational Therapy

## 2022-12-17 VITALS — Ht 70.0 in | Wt 195.0 lb

## 2022-12-17 DIAGNOSIS — R2681 Unsteadiness on feet: Secondary | ICD-10-CM | POA: Diagnosis not present

## 2022-12-17 DIAGNOSIS — M6281 Muscle weakness (generalized): Secondary | ICD-10-CM

## 2022-12-17 DIAGNOSIS — R2689 Other abnormalities of gait and mobility: Secondary | ICD-10-CM

## 2022-12-17 DIAGNOSIS — Z0000 Encounter for general adult medical examination without abnormal findings: Secondary | ICD-10-CM | POA: Diagnosis not present

## 2022-12-17 DIAGNOSIS — G610 Guillain-Barre syndrome: Secondary | ICD-10-CM | POA: Diagnosis not present

## 2022-12-17 DIAGNOSIS — R208 Other disturbances of skin sensation: Secondary | ICD-10-CM

## 2022-12-17 DIAGNOSIS — R278 Other lack of coordination: Secondary | ICD-10-CM | POA: Diagnosis not present

## 2022-12-17 NOTE — Patient Instructions (Addendum)
    Resisted Horizontal Abduction: Bilateral   Sit or stand, tubing in both hands, arms out in front. Keeping arms straight, pinch shoulder blades together and stretch arms out. Repeat _15__ times per set. Do _1__ sessions per day, every other day.   Elbow Flexion: Resisted   With tubing held in ____one__ hand(s) and other end secured under foot, curl arm up as far as possible. Repeat _15__ times per set. Do _1__ sessions per day, every other day.    Elbow Extension: Resisted   Sit in chair with resistive band secured at armrest (or hold with other hand) and ____one___ elbow bent. Straighten elbow. Repeat _15___ times per set.  Do _1__ sessions per day, every other day.   Copyright  VHI. All rights reserved.

## 2022-12-17 NOTE — Patient Instructions (Signed)
Joe Greene , Thank you for taking time to come for your Medicare Wellness Visit. I appreciate your ongoing commitment to your health goals. Please review the following plan we discussed and let me know if I can assist you in the future.   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  Never done   COVID-19 Vaccine (6 - 2023-24 season) 10/02/2022   Screening for Lung Cancer  09/25/2023   Medicare Annual Wellness Visit  12/18/2023   DTaP/Tdap/Td vaccine (3 - Td or Tdap) 09/02/2030   Colon Cancer Screening  12/08/2030   Pneumonia Vaccine  Completed   Flu Shot  Completed   Zoster (Shingles) Vaccine  Completed   HPV Vaccine  Aged Out    Advanced directives: copy in chart  Conditions/risks identified: none  Next appointment: Follow up in one year for your annual wellness visit.   Preventive Care 34 Years and Older, Male  Preventive care refers to lifestyle choices and visits with your health care provider that can promote health and wellness. What does preventive care include? A yearly physical exam. This is also called an annual well check. Dental exams once or twice a year. Routine eye exams. Ask your health care provider how often you should have your eyes checked. Personal lifestyle choices, including: Daily care of your teeth and gums. Regular physical activity. Eating a healthy diet. Avoiding tobacco and drug use. Limiting alcohol use. Practicing safe sex. Taking low doses of aspirin every day. Taking vitamin and mineral supplements as recommended by your health care provider. What happens during an annual well check? The services and screenings done by your health care provider during your  annual well check will depend on your age, overall health, lifestyle risk factors, and family history of disease. Counseling  Your health care provider may ask you questions about your: Alcohol use. Tobacco use. Drug use. Emotional well-being. Home and relationship well-being. Sexual activity. Eating habits. History of falls. Memory and ability to understand (cognition). Work and work Statistician. Screening  You may have the following tests or measurements: Height, weight, and BMI. Blood pressure. Lipid and cholesterol levels. These may be checked every 5 years, or more frequently if you are over 34 years old. Skin check. Lung cancer screening. You may have this screening every year starting at age 27 if you have a 30-pack-year history of smoking and currently smoke or have quit within the past 15 years. Fecal occult blood test (FOBT) of the stool. You may have this test every year starting at age 43. Flexible sigmoidoscopy or colonoscopy. You may have a sigmoidoscopy every 5 years or a colonoscopy every 10 years starting at age 61. Prostate cancer screening. Recommendations will vary depending on your family history and other risks. Hepatitis C blood test. Hepatitis B blood test. Sexually transmitted disease (STD) testing. Diabetes screening. This is done by checking your blood sugar (glucose) after you have not eaten for a while (fasting). You may have this done every 1-3 years. Abdominal aortic aneurysm (AAA) screening. You may need this if you are a current or former smoker.  Osteoporosis. You may be screened starting at age 53 if you are at high risk. Talk with your health care provider about your test results, treatment options, and if necessary, the need for more tests. Vaccines  Your health care provider may recommend certain vaccines, such as: Influenza vaccine. This is recommended every year. Tetanus, diphtheria, and acellular pertussis (Tdap, Td) vaccine. You may need a Td  booster every 10 years. Zoster vaccine. You may need this after age 32. Pneumococcal 13-valent conjugate (PCV13) vaccine. One dose is recommended after age 31. Pneumococcal polysaccharide (PPSV23) vaccine. One dose is recommended after age 92. Talk to your health care provider about which screenings and vaccines you need and how often you need them. This information is not intended to replace advice given to you by your health care provider. Make sure you discuss any questions you have with your health care provider. Document Released: 11/25/2015 Document Revised: 07/18/2016 Document Reviewed: 08/30/2015 Elsevier Interactive Patient Education  2017 [place] Prevention in the Home Falls can cause injuries. They can happen to people of all ages. There are many things you can do to make your home safe and to help prevent falls. What can I do on the outside of my home? Regularly fix the edges of walkways and driveways and fix any cracks. Remove anything that might make you trip as you walk through a door, such as a raised step or threshold. Trim any bushes or trees on the path to your home. Use bright outdoor lighting. Clear any walking paths of anything that might make someone trip, such as rocks or tools. Regularly check to see if handrails are loose or broken. Make sure that both sides of any steps have handrails. Any raised decks and porches should have guardrails on the edges. Have any leaves, snow, or ice cleared regularly. Use sand or salt on walking paths during winter. Clean up any spills in your garage right away. This includes oil or grease spills. What can I do in the bathroom? Use night lights. Install grab bars by the toilet and in the tub and shower. Do not use towel bars as grab bars. Use non-skid mats or decals in the tub or shower. If you need to sit down in the shower, use a plastic, non-slip stool. Keep the floor dry. Clean up any water that spills on the floor  as soon as it happens. Remove soap buildup in the tub or shower regularly. Attach bath mats securely with double-sided non-slip rug tape. Do not have throw rugs and other things on the floor that can make you trip. What can I do in the bedroom? Use night lights. Make sure that you have a light by your bed that is easy to reach. Do not use any sheets or blankets that are too big for your bed. They should not hang down onto the floor. Have a firm chair that has side arms. You can use this for support while you get dressed. Do not have throw rugs and other things on the floor that can make you trip. What can I do in the kitchen? Clean up any spills right away. Avoid walking on wet floors. Keep items that you use a lot in easy-to-reach places. If you need to reach something above you, use a strong step stool that has a grab bar. Keep electrical cords out of the way. Do not use floor polish or wax that makes floors slippery. If you must use wax, use non-skid floor  wax. Do not have throw rugs and other things on the floor that can make you trip. What can I do with my stairs? Do not leave any items on the stairs. Make sure that there are handrails on both sides of the stairs and use them. Fix handrails that are broken or loose. Make sure that handrails are as long as the stairways. Check any carpeting to make sure that it is firmly attached to the stairs. Fix any carpet that is loose or worn. Avoid having throw rugs at the top or bottom of the stairs. If you do have throw rugs, attach them to the floor with carpet tape. Make sure that you have a light switch at the top of the stairs and the bottom of the stairs. If you do not have them, ask someone to add them for you. What else can I do to help prevent falls? Wear shoes that: Do not have high heels. Have rubber bottoms. Are comfortable and fit you well. Are closed at the toe. Do not wear sandals. If you use a stepladder: Make sure that it is  fully opened. Do not climb a closed stepladder. Make sure that both sides of the stepladder are locked into place. Ask someone to hold it for you, if possible. Clearly mark and make sure that you can see: Any grab bars or handrails. First and last steps. Where the edge of each step is. Use tools that help you move around (mobility aids) if they are needed. These include: Canes. Walkers. Scooters. Crutches. Turn on the lights when you go into a dark area. Replace any light bulbs as soon as they burn out. Set up your furniture so you have a clear path. Avoid moving your furniture around. If any of your floors are uneven, fix them. If there are any pets around you, be aware of where they are. Review your medicines with your doctor. Some medicines can make you feel dizzy. This can increase your chance of falling. Ask your doctor what other things that you can do to help prevent falls. This information is not intended to replace advice given to you by your health care provider. Make sure you discuss any questions you have with your health care provider. Document Released: 08/25/2009 Document Revised: 04/05/2016 Document Reviewed: 12/03/2014 Elsevier Interactive Patient Education  2017 [employer].

## 2022-12-17 NOTE — Therapy (Signed)
OUTPATIENT PHYSICAL THERAPY NEURO THERAPY    Patient Name: Joe Greene MRN: [id number] DOB:[birth year], 72 y.o., male Today's Date: 12/17/2022   PCP: Arlester Marker, MD REFERRING PROVIDER: Marlowe Sh[place], PA-C  END OF SESSION:  PT End of Session - 12/17/22 1017     Visit Number 12    Number of Visits 25    Date for PT Re-Evaluation 01/13/23    Authorization Type [employer] [place]    Progress Note Due on Visit 10    PT Start Time 8676    PT Stop Time 1057    PT Time Calculation (min) 42 min    Equipment Utilized During Treatment Gait belt    Activity Tolerance Patient tolerated treatment well    Behavior During Therapy WFL for tasks assessed/performed                 Past Medical History:  Diagnosis Date   AAA (abdominal aortic aneurysm) ([place])    Cataract    forming    Guillain Barr syndrome ([place])    Past Surgical History:  Procedure Laterality Date   FOOT SURGERY Right    HERNIA REPAIR Right    IR FLUORO GUIDE CV LINE RIGHT  10/25/2022   IR US GUIDE VASC ACCESS RIGHT  10/25/2022   KNEE ARTHROSCOPY Left    WISDOM TOOTH EXTRACTION     Patient Active Problem List   Diagnosis Date Noted   Left-sided low back pain with left-sided sciatica 12/11/2022   CIDP (chronic inflammatory demyelinating polyneuropathy) ([place]) 12/11/2022   Constipation 11/15/2022   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[place]) 11/02/2022   Acute sensory neuropathy 10/25/2022   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[place]) 10/18/2022   B12 deficiency 10/18/2022   Gait abnormality 10/18/2022   Pre-diabetes 09/25/2022   AAA (abdominal aortic aneurysm) ([place]) 09/24/2022   Essential hypertension 09/24/2022   Iron deficiency 04/04/2022   Hypertriglyceridemia 04/03/2022   Coronary artery calcification 01/18/2022   Candidal intertrigo    Emphysema of lung ([place]) 07/26/2021   Aortic atherosclerosis ([place]) 07/26/2021   Internal hemorrhoids 01/23/2021   Diverticula of colon 01/23/2021   Eczema  01/23/2021   Actinic keratoses 01/23/2021   Tobacco use 09/02/2020    ONSET DATE: 11/06/2022 (referral date)  REFERRING DIAG: AIDP, GBS  THERAPY DIAG:  Muscle weakness (generalized)  Unsteadiness on feet  Other abnormalities of gait and mobility  Rationale for Evaluation and Treatment: Rehabilitation  SUBJECTIVE:  SUBJECTIVE STATEMENT: Pt reports feeling better today. Stated he took therapist's advice and did not do any exercise this weekend, slept well. No falls   Pt accompanied by: self   PERTINENT HISTORY: Patient is in today for follow-up from his recent hospitalization. Mr. Cobarrubias was admitted at [facility] from 11/13-11/20/2023. He presented with right leg weakness and ascending numbness of his fingers and toes. He had extensive work up. 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 He was treated with a course of IVIG. He underwent some initial physical therapy. Due to some logistical challenges, the decision was made to discharge him to home, rather than move to inpatient rehab.   Since returning home, Mr. Sylve notes he had fluctuating weakness in the legs. Therefore returned to [place] and was hospitalized with new diagnosis of AIDP on 11/02/22, went to inpatient rehab and then returned home 11/10/22.   Other issues were identified during his hospital stay, including some hyperglycemia with an A1c of 6.5%, a mildly high TSH with a normal T4, elevated blood pressures, and some dyspnea issues. His blood pressure was high enough that he was started on losartan 25 mg daily. He has been trying to quit smoking now in light of his current issues. Additionally, the hospital noted his infrarenal AAA.  PAIN:  Are you having pain? Does not rate, but does report increased tingling in B feet,  some pain in BLEs  PRECAUTIONS: Fall  WEIGHT BEARING RESTRICTIONS: No  FALLS: Has patient fallen in last 6 months? No  LIVING ENVIRONMENT: Lives with: lives with their family Lives in: House/apartment Stairs: No Has following equipment at home: Environmental consultant - 2 wheeled, shower chair, and Grab bars  PLOF: Requires assistive device for independence, Needs assistance with ADLs, and Needs assistance with homemaking  PATIENT GOALS: "I want to get back to normal."   OBJECTIVE:   DIAGNOSTIC FINDINGS: Most recent nerve testing revealed AIDP  COGNITION: Overall cognitive status: Within functional limits for tasks assessed   SENSATION: Light touch: Impaired B numbness and tingling up to thighs and also in B hands  COORDINATION: WFL heel to shin     POSTURE: rounded shoulders and forward head Has to sit off of L hip at times due to pain     FUNCTIONAL TESTS: From eval  5 times sit to stand: 18.63 secs with B UE support  Timed up and go (TUG): 22.63 secs without AD with min A 10 meter walk test: 2.38 ft/sec with RW Berg Balance Scale: 33/56 (down from 42/56 when left CIR)    TODAY'S TREATMENT:               Gait Training  Gait pattern:  ER of BLEs, step through pattern, wide BOS, and poor foot clearance- Right Distance walked: 115'  Assistive device utilized: Environmental consultant - 2 wheeled and PLS AFO on L side Level of assistance: Modified independence Comments: Trialed AFO to see if more energy efficient for pt. Noted increased gait speed w/use of AFO. Pt stated he could not tell a difference with AFO on vs off.    2MWT  Gait pattern:  ER of BLEs, step through pattern, wide BOS, and poor foot clearance- Right Distance walked: 435'  Assistive device utilized: Environmental consultant - 2 wheeled Level of assistance: Modified independence Comments: Noted occasional scuffing of RLE throughout but no knee instability noted. Pt did have minor LOB after test wihile turning to sit on mat due to moving too  quickly and catching L foot  on walker, requiring CGA. RPE of 6/10 following activity   NMR  In // bars for improved stability when reaching out of BOS, LE coordination and lateral weight shifting:  Cone rotation and contralateral stack drill, x9 cones per side, w/min A for anterior LOB correction. Noted pt moving very quickly and would lose balance w/increased speed. Also noted very wide BOS throughout. Pt had increased difficulty when performing drill w/RUE >LUE  Progressed to performing drill when standing on [employer], x9 cones per side, for increased balance challenge. Encouraged pt to slow down for increased stability but pt continued to require min A for anterior LOB correction. RPE of 6/10 following activity  Side step w/reach over 4" hurdle, x10 per side, w/CGA. Pt w/increased difficulty performing on R side > L side and noted poor foot placement of RLE, resulting in anterolateral LOB to R side. CGA-min A throughout  Supine glute bridges w/green theraband around distal quads to facilitate increased hip abduction, x10 reps. Pt initially sustained cramp in R HS following 2 reps, so provided manual supine stretch x90s prior to continuing   RPE of 710 following session                        PATIENT EDUCATION: Education details: Continue HEP, information on AFOs  Person educated: Patient Education method: Explanation and Verbal cues Education comprehension: verbalized understanding and needs further education  HOME EXERCISE PROGRAM: Access Code: [email or web address] URL: [email or web address] Date: 11/21/2022 Prepared by: Cameron Sprang  Exercises - Standing March with Counter Support  - 1-2 x daily - 5 x weekly - 1-2 sets - 5-10 reps - 3 hold - Standing Thoracic and Cervical Rotation with Reach at Wall  - 1-2 x daily - 5 x weekly - 1-2 sets - 3-5 reps - 5 hold - Mini Squat with Counter Support  - 1-2 x daily - 5 x weekly - 1-2 sets - 5-10 reps - 3 hold - Standing Hip Abduction with  Counter Support  - 1 x daily - 7 x weekly - 1-2 sets - 10 reps - Standing Hip Extension with Counter Support  - 1 x daily - 7 x weekly - 1-2 sets - 10 reps - Supine Figure 4 Piriformis Stretch  - 2-3 x daily - 7 x weekly - 1 sets - 2-3 reps - 30 secs hold - Seated Piriformis Stretch with Trunk Bend  - 1 x daily - 7 x weekly - 3-4 reps - 30-60 second hold - Seated Diaphragmatic Breathing  - 1 x daily - 7 x weekly - 3 sets - 10 reps - 3-5 second hold  GOALS: Goals reviewed with patient? Yes  SHORT TERM GOALS: Target date: 12/15/22  Pt will be independent with initial HEP for improved strength, balance, transfers and gait. Baseline: Goal status: MET   2.  Pt will improve gait velocity to at least 2.75 ft/sec for improved gait efficiency and performance at S level  Baseline: 2.38 ft/sec with CGA and RW (1/3); 2.73 ft/s with RW and S*  Goal status: MET   3.  Pt will ambulate 500' w/LRAD and S* on 2MWT for improved endurance and efficiency with gait Baseline: 460' w/RW and CGA; 435' w/RW mod I Goal status: NOT MET    4. Pt will improve BERG balance score 4 points from baseline in order to indicate dec fall risk.   Baseline: 33/56; 36/56 on 2/2   Goal Status: PARTIALLY MET  LONG TERM GOALS: Target date: 01/13/2023   Pt will be independent with final HEP for improved strength, balance, transfers and gait. Baseline:  Goal status: INITIAL   2. Pt will improve gait velocity to at least 3.0 ft/sec with cane or no AD for improved gait efficiency and performance at Supervision level  Baseline: 2.38 ft/sec with RW and CGA Goal status: INITIAL   3.  Pt will improve 5 x STS to less than or equal to 15 seconds without UE support to demonstrate improved functional strength and transfer efficiency.  Baseline: 18.63 with BUE support  Goal status: INITIAL   4.  Pt will improve normal TUG to less than or equal to 18 seconds without AD for improved functional mobility and decreased fall  risk. Baseline: 15.97 sec (12/8) Goal status: INITIAL   5. Pt will ambulate 550' w/LRAD and mod I on for improved endurance and efficiency with gait Baseline: 460' w/RW and CGA Goal status: REVISED  6. Pt will improve BERG balance score 8 points from baseline in order to indicate dec fall risk.   Baseline: 33/56  Goal Status: INITIAL  7.  Pt will negotiate up/down 4 steps with single rail/cane, up/down ramp and curb and ambulate x 500' outdoors over unlevel paved surfaces at S level with cane or less in order to indicate improved community mobility.   Baseline:    Goal Status: INITIAL      ASSESSMENT:  CLINICAL IMPRESSION: Emphasis of skilled PT session on completing STG assessment and dynamic balance tasks. Pt did not meet goal 3 of 4, but did perform test w/less assistance compared to eval. Pt overall more stable today but continues to be limited by fatigue. Pt most challenged by rotational balance tasks and reaching out of BOS but performs better if he slows down. Continue POC.   OBJECTIVE IMPAIRMENTS: Abnormal gait, cardiopulmonary status limiting activity, decreased activity tolerance, decreased balance, decreased endurance, decreased knowledge of use of DME, decreased mobility, difficulty walking, decreased strength, impaired perceived functional ability, impaired sensation, impaired UE functional use, improper body mechanics, and postural dysfunction.   ACTIVITY LIMITATIONS: carrying, lifting, bending, standing, squatting, stairs, transfers, bathing, reach over head, and locomotion level  PARTICIPATION LIMITATIONS: meal prep, cleaning, laundry, driving, shopping, community activity, and yard work  PERSONAL FACTORS: Past/current experiences and 3+ comorbidities: see above  are also affecting patient's functional outcome.   REHAB POTENTIAL: Excellent  CLINICAL DECISION MAKING: Evolving/moderate complexity  EVALUATION COMPLEXITY: Moderate  PLAN:  PT FREQUENCY:  3x/week  PT DURATION: 8 weeks  PLANNED INTERVENTIONS: Therapeutic exercises, Therapeutic activity, Neuromuscular re-education, Balance training, Gait training, Patient/Family education, Self Care, Stair training, Vestibular training, DME instructions, and Aquatic Therapy  PLAN FOR NEXT SESSION:  How is HEP? dynamic standing balance with small ranges initially with focus on control, compliant surfaces when able, gait with RW with obstacles and turns, activities without AD as able.  Ankle strategy, narrowed BOS/tandem.    Josephine Igo, PT, DPT [facility] [address] [place], [place]  [address] Phone:  [phone] Fax:  [phone] 12/17/22, 10:58 AM

## 2022-12-17 NOTE — Progress Notes (Signed)
I connected with Joe Greene today by telephone and verified that I am speaking with the correct person using two identifiers. Location patient: home Location provider: work Persons participating in the virtual visit: [place], Scaife LPN.   I discussed the limitations, risks, security and privacy concerns of performing an evaluation and management service by telephone and the availability of in person appointments. I also discussed with the patient that there may be a patient responsible charge related to this service. The patient expressed understanding and verbally consented to this telephonic visit.    Interactive audio and video telecommunications were attempted between this provider and patient, however failed, due to patient having technical difficulties OR patient did not have access to video capability.  We continued and completed visit with audio only.     Vital signs may be patient reported or missing.  Subjective:   Joe Greene is a 72 y.o. male who presents for Medicare Annual/Subsequent preventive examination.  Review of Systems     Cardiac Risk Factors include: advanced age (>55men, >61 women);male gender     Objective:    Today's Vitals   12/17/22 1511 12/17/22 1512  Weight: 195 lb (88.5 kg)   Height: 5\' 10"  (1.778 m)   PainSc:  3    Body mass index is 27.98 kg/m.     12/17/2022    3:15 PM 12/03/2022   11:41 AM 11/16/2022    2:00 AM 11/15/2022    7:37 AM 11/14/2022   10:21 AM 11/02/2022   10:00 PM 10/26/2022    1:30 PM  Advanced Directives  Does Patient Have a Medical Advance Directive? Yes Yes  Yes Yes Yes Yes  Type of Paramedic of [place];Living will Living will Living will  [place];Living will [place];Living will [place];Living will  Does patient want to make changes to medical advance directive?   No - Patient declined   No - Patient declined No - Patient declined   Copy of [place] in Chart? Yes - validated most recent copy scanned in chart (See row information) Yes - validated most recent copy scanned in chart (See row information)   Yes - validated most recent copy scanned in chart (See row information)      Current Medications (verified) Outpatient Encounter Medications as of 12/17/2022  Medication Sig   acetaminophen (TYLENOL) 325 MG tablet Take 1-2 tablets (325-650 mg total) by mouth every 4 (four) hours as needed for mild pain.   aspirin EC 81 MG tablet Take 81 mg by mouth in the morning.   Cyanocobalamin (B-12 PO) Take 1 tablet by mouth daily with lunch.   DOCUSATE SODIUM PO Take 1 capsule by mouth daily as needed (constipation).   DULoxetine (CYMBALTA) 30 MG capsule Take 1 capsule (30 mg total) by mouth daily.   DULoxetine (CYMBALTA) 60 MG capsule Take 1 capsule (60 mg total) by mouth daily.   ferrous sulfate 325 (65 FE) MG EC tablet Take 325 mg by mouth daily with lunch.   Fluocinonide (LIDEX EX) Apply 1 application  topically daily as needed (rash).   gabapentin (NEURONTIN) 300 MG capsule Take 2 capsules (600 mg total) by mouth 3 (three) times daily.   ibuprofen (ADVIL) 200 MG tablet Take 400 mg by mouth daily as needed for headache or mild pain.   Multiple Vitamins-Minerals (CENTRUM SILVER) tablet Take 1 tablet by mouth daily with lunch.   nicotine (NICODERM CQ - DOSED  IN MG/24 HOURS) 14 mg/24hr patch Place 1 patch (14 mg total) onto the skin daily.   nicotine (NICODERM CQ - DOSED IN MG/24 HOURS) 21 mg/24hr patch Place 21 mg onto the skin daily as needed (nicotine craving).   Omega-3 Fatty Acids (FISH OIL PO) Take 1 capsule by mouth daily with lunch. When able to remember   sildenafil (VIAGRA) 100 MG tablet Take 100 mg by mouth daily as needed for erectile dysfunction.   No facility-administered encounter medications on file as of 12/17/2022.    Allergies (verified) Patient has no known allergies.   History: Past  Medical History:  Diagnosis Date   AAA (abdominal aortic aneurysm) (HCC)    Cataract    forming    Guillain Barr syndrome (HCC)    Past Surgical History:  Procedure Laterality Date   FOOT SURGERY Right    HERNIA REPAIR Right    IR FLUORO GUIDE CV LINE RIGHT  10/25/2022   IR US GUIDE VASC ACCESS RIGHT  10/25/2022   KNEE ARTHROSCOPY Left    WISDOM TOOTH EXTRACTION     Family History  Problem Relation Age of Onset   Stroke Mother 1   AAA (abdominal aortic aneurysm) Mother    Cancer Father        Prostate   Colon cancer Neg Hx    Colon polyps Neg Hx    Esophageal cancer Neg Hx    Rectal cancer Neg Hx    Stomach cancer Neg Hx    Social History   Socioeconomic History   Marital status: Married    Spouse name: Emersyn Kotarski   Number of children: 2   Years of education: Not on file   Highest education level: Not on file  Occupational History   Occupation: Retired    Comment: Scientist, research (medical)- [place]  Tobacco Use   Smoking status: Former    Packs/day: 2.00    Years: 50.00    Total pack years: 100.00    Types: Cigarettes    Quit date: 09/18/2022    Years since quitting: 0.2   Smokeless tobacco: Never  Vaping Use   Vaping Use: Never used  Substance and Sexual Activity   Alcohol use: Yes    Alcohol/week: 3.0 standard drinks of alcohol    Types: 3 Standard drinks or equivalent per week    Comment: 2 or 3 drinks per week   Drug use: Never   Sexual activity: Yes  Other Topics Concern   Not on file  Social History Narrative   Not on file   Social Determinants of Health   Financial Resource Strain: Low Risk  (12/17/2022)   Overall Financial Resource Strain (CARDIA)    Difficulty of Paying Living Expenses: Not hard at all  Food Insecurity: No Food Insecurity (12/17/2022)   Hunger Vital Sign    Worried About Running Out of Food in the Last Year: Never true    Ran Out of Food in the Last Year: Never true  Transportation Needs: No Transportation Needs (12/17/2022)    PRAPARE - Administrator, Civil Service (Medical): No    Lack of Transportation (Non-Medical): No  Physical Activity: Inactive (12/17/2022)   Exercise Vital Sign    Days of Exercise per Week: 0 days    Minutes of Exercise per Session: 0 min  Stress: No Stress Concern Present (12/17/2022)   [employer] of Occupational Health - Occupational Stress Questionnaire    Feeling of Stress : Not at all  Social Connections: Moderately Integrated (11/14/2021)   Social Connection and Isolation Panel [NHANES]    Frequency of Communication with Friends and Family: Twice a week    Frequency of Social Gatherings with Friends and Family: Twice a week    Attends Religious Services: More than 4 times per year    Active Member of [employer] or Organizations: No    Attends Music therapist: Never    Marital Status: Married    Tobacco Counseling Counseling given: Not Answered   Clinical Intake:  Pre-visit preparation completed: Yes  Pain : 0-10 Pain Score: 3  Pain Type: Chronic pain Pain Location: Leg Pain Orientation: Left, Right Pain Onset: More than a month ago Pain Frequency: Constant     Nutritional Status: BMI 25 -29 Overweight Nutritional Risks: None Diabetes: No  How often do you need to have someone help you when you read instructions, pamphlets, or other written materials from your doctor or pharmacy?: 1 - Never  Diabetic? no  Interpreter Needed?: No  Information entered by :: NAllen LPN   Activities of Daily Living    12/17/2022    3:16 PM 12/13/2022    9:15 AM  In your present state of health, do you have any difficulty performing the following activities:  Hearing? 0 0  Vision? 0 0  Difficulty concentrating or making decisions? 0 0  Walking or climbing stairs? 1 1  Dressing or bathing? 1 1  Doing errands, shopping? 1 1  Preparing Food and eating ? Y Y  Using the Toilet? N N  In the past six months, have you accidently leaked urine? N N  Do you  have problems with loss of bowel control? N N  Managing your Medications? N N  Managing your Finances? N N  Housekeeping or managing your Housekeeping? N N    Patient Care Team: Haydee Salter, MD as PCP - General (Family Medicine) Marcial Pacas, MD as Consulting Physician (Neurology)  Indicate any recent Medical Services you may have received from other than [facility] providers in the past year (date may be approximate).     Assessment:   This is a routine wellness examination for Joe Greene.  Hearing/Vision screen Vision Screening - Comments:: Regular eye exams, [facility]  Dietary issues and exercise activities discussed: Current Exercise Habits: The patient does not participate in regular exercise at present   Goals Addressed             This Visit's Progress    Patient Stated       12/17/2022, wants to get over GBS       Depression Screen    12/17/2022    3:16 PM 11/21/2022    1:46 PM 11/14/2021    9:10 AM 11/14/2021    9:06 AM 10/04/2021   11:29 AM 09/15/2020   12:51 PM 09/02/2020   11:00 AM  PHQ 2/9 Scores  PHQ - 2 Score 0 0 0 0 0 0 0    Fall Risk    12/17/2022    3:16 PM 12/13/2022    9:15 AM 11/21/2022    1:46 PM 11/14/2021    9:10 AM 11/14/2021    8:55 AM  [place] in the past year? 1 1 0 0 0  Number falls in past yr: 0 0 0 0 0  Injury with Fall? 0 0 0 0 0  Risk for fall due to : Medication side effect  No Fall Risks  Follow up Education provided;Falls prevention discussed;Falls evaluation completed  Falls evaluation completed Falls evaluation completed     FALL RISK PREVENTION PERTAINING TO THE HOME:  Any stairs in or around the home? Yes  If so, are there any without handrails?  ramp Home free of loose throw rugs in walkways, pet beds, electrical cords, etc? Yes  Adequate lighting in your home to reduce risk of falls? Yes   ASSISTIVE DEVICES UTILIZED TO PREVENT FALLS:  Life alert? No  Use of a cane, walker or w/c? Yes  Grab bars in the  bathroom? Yes  Shower chair or bench in shower? Yes  Elevated toilet seat or a handicapped toilet? No   TIMED UP AND GO:  Was the test performed? No .       Cognitive Function:        12/17/2022    3:17 PM  6CIT Screen  What Year? 0 points  What month? 0 points  What time? 0 points  Count back from 20 0 points  Months in reverse 0 points  Repeat phrase 0 points  Total Score 0 points    Immunizations Immunization History  Administered Date(s) Administered   Hepatitis A 07/28/2010   Influenza Whole 08/09/2020   Influenza-Unspecified 08/14/2021, 08/07/2022   PFIZER(Purple Top)SARS-COV-2 Vaccination 12/17/2019, 01/07/2020, 08/14/2020   Pfizer Covid-19 Vaccine Bivalent Booster 70yrs & up 08/18/2021, 08/07/2022   Pneumococcal Conjugate-13 04/03/2022   Pneumococcal Polysaccharide-23 09/02/2020   Td 07/28/2010   Tdap 09/02/2020   Zoster Recombinat (Shingrix) 09/02/2015, 04/02/2022    TDAP status: Up to date  Flu Vaccine status: Up to date  Pneumococcal vaccine status: Up to date  Covid-19 vaccine status: Completed vaccines  Qualifies for Shingles Vaccine? Yes   Zostavax completed Yes   Shingrix Completed?: Yes  Screening Tests Health Maintenance  Topic Date Due   Hepatitis C Screening  Never done   COVID-19 Vaccine (6 - 2023-24 season) 10/02/2022   Medicare Annual Wellness (AWV)  11/14/2022   Lung Cancer Screening  09/25/2023   DTaP/Tdap/Td (3 - Td or Tdap) 09/02/2030   COLONOSCOPY (Pts 45-80yrs Insurance coverage will need to be confirmed)  12/08/2030   Pneumonia Vaccine 2+ Years old  Completed   INFLUENZA VACCINE  Completed   Zoster Vaccines- Shingrix  Completed   HPV VACCINES  Aged Out    Health Maintenance  Health Maintenance Due  Topic Date Due   Hepatitis C Screening  Never done   COVID-19 Vaccine (6 - 2023-24 season) 10/02/2022   Medicare Annual Wellness (AWV)  11/14/2022    Colorectal cancer screening: Type of screening: Colonoscopy.  Completed 12/08/2020. Repeat every 10 years  Lung Cancer Screening: (Low Dose CT Chest recommended if Age 60-80 years, 30 pack-year currently smoking OR have quit w/in 15years.) does qualify.   Lung Cancer Screening Referral: CT scan 01/18/2023  Additional Screening:  Hepatitis C Screening: does qualify;   Vision Screening: Recommended annual ophthalmology exams for early detection of glaucoma and other disorders of the eye. Is the patient up to date with their annual eye exam?  Yes  Who is the provider or what is the name of the office in which the patient attends annual eye exams? [facility] If pt is not established with a provider, would they like to be referred to a provider to establish care? No .   Dental Screening: Recommended annual dental exams for proper oral hygiene  Community Resource Referral / Chronic Care Management: CRR required this visit?  No   CCM required this visit?  No      Plan:     I have personally reviewed and noted the following in the patient's chart:   Medical and social history Use of alcohol, tobacco or illicit drugs  Current medications and supplements including opioid prescriptions. Patient is not currently taking opioid prescriptions. Functional ability and status Nutritional status Physical activity Advanced directives List of other physicians Hospitalizations, surgeries, and ER visits in previous 12 months Vitals Screenings to include cognitive, depression, and falls Referrals and appointments  In addition, I have reviewed and discussed with patient certain preventive protocols, quality metrics, and best practice recommendations. A written personalized care plan for preventive services as well as general preventive health recommendations were provided to patient.     Kellie Simmering, LPN   [id number]   Nurse Notes: none  Due to this being a virtual visit, the after visit summary with patients personalized plan was offered  to patient via mail or my-chart. Patient would like to access on my-chart

## 2022-12-17 NOTE — Therapy (Signed)
OUTPATIENT OCCUPATIONAL THERAPY NEURO TREATMENT  Patient Name: Joe Greene MRN: [id number] DOB:[birth year], 72 y.o., male Today's Date: 12/17/2022  PCP: Dr. Gena Fray REFERRING PROVIDER: Dr. Gena Fray  END OF SESSION:  OT End of Session - 12/17/22 1103     Visit Number 4    Number of Visits 25    Date for OT Re-Evaluation 02/04/23    Authorization Type [employer] Medicare    Authorization - Visit Number 4    Progress Note Due on Visit 10    OT Start Time 1103    OT Stop Time 1145    OT Time Calculation (min) 42 min    Activity Tolerance Patient tolerated treatment well    Behavior During Therapy WFL for tasks assessed/performed             Past Medical History:  Diagnosis Date   AAA (abdominal aortic aneurysm) ([place])    Cataract    forming    Guillain Barr syndrome ([place])    Past Surgical History:  Procedure Laterality Date   FOOT SURGERY Right    HERNIA REPAIR Right    IR FLUORO GUIDE CV LINE RIGHT  10/25/2022   IR US GUIDE VASC ACCESS RIGHT  10/25/2022   KNEE ARTHROSCOPY Left    WISDOM TOOTH EXTRACTION     Patient Active Problem List   Diagnosis Date Noted   Left-sided low back pain with left-sided sciatica 12/11/2022   CIDP (chronic inflammatory demyelinating polyneuropathy) ([place]) 12/11/2022   Constipation 11/15/2022   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[place]) 11/02/2022   Acute sensory neuropathy 10/25/2022   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[place]) 10/18/2022   B12 deficiency 10/18/2022   Gait abnormality 10/18/2022   Pre-diabetes 09/25/2022   AAA (abdominal aortic aneurysm) ([place]) 09/24/2022   Essential hypertension 09/24/2022   Iron deficiency 04/04/2022   Hypertriglyceridemia 04/03/2022   Coronary artery calcification 01/18/2022   [place]idal intertrigo    Emphysema of lung ([place]) 07/26/2021   Aortic atherosclerosis ([place]) 07/26/2021   Internal hemorrhoids 01/23/2021   Diverticula of colon 01/23/2021   Eczema 01/23/2021   Actinic keratoses 01/23/2021    Tobacco use 09/02/2020    ONSET DATE: 11/07/23  REFERRING DIAG:  Diagnosis  G35 (ICD-10-CM) - Multiple sclerosis  G61.0 (ICD-10-CM) - Guillain-Barre syndrome  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 He has been working with bolts at home to help improve hand function.   Pt accompanied by: self  PERTINENT HISTORY: Pt is a 72 yo M who presents with 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 PMH sig for GBS, HTN, COPD, MS.  Patient is in today for follow-up from his recent hospitalization. [name] was admitted at [facility] from 11/13-11/20/2023. He presented with right leg weakness and ascending numbness of his fingers and toes. He had extensive work up. 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 He was treated with a course of IVIG. He underwent some initial physical therapy. Due to some logistical challenges, the decision was made to discharge him to home, rather than move to inpatient rehab.   Since returning home, [name] notes he had fluctuating weakness in the legs. Therefore returned to MD and was hospitalized with  new diagnosis of AIDP on 11/02/22, went to inpatient rehab and then returned home 11/10/22.   Other issues were identified during his hospital stay, including some hyperglycemia with an A1c of 6.5%, a mildly high TSH with a normal T4, elevated blood pressures, and some dyspnea issues. His blood pressure was high enough that he was started on losartan 25 mg daily. He has been trying to quit smoking now in light of his current issues. Additionally, the hospital noted  his infrarenal AAA.   PRECAUTIONS: Fall  WEIGHT BEARING RESTRICTIONS: No  PAIN:  Are you having pain? Yes: NPRS scale: 3/10 Pain location: LE's Pain description: aching Aggravating factors: sitting Relieving factors: repositioning     FALLS: Has patient fallen in last 6 months? No  LIVING ENVIRONMENT: Lives with: lives with their family and lives with their spouse Lives in: House/apartment Stairs: No Has following equipment at home: Ramped entry  PLOF: Independent  PATIENT GOALS: Get back to using hands normally  OBJECTIVE:   HAND DOMINANCE: Right  ADLs: Overall ADLs: increased time required Transfers/ambulation related to ADLs:uses walker  Eating: mod I Grooming: mod I UB Dressing: set up LB Dressing: set up Toileting: mod I with walker Bathing: walk in shower with grab bar has 3 in 1 but does not use, mod A  Tub Shower transfers: min A Equipment:  3 in 1  IADLs: Shopping: dependent Light housekeeping: dependent Meal Prep: does not Sport and exercise psychologist mobility: supervision Medication management: pt's wife assists Doctor, hospital: pt assists but difficulty writing Handwriting: 100% legible  MOBILITY STATUS:  uses walker  POSTURE COMMENTS:  No Significant postural limitations    FUNCTIONAL OUTCOME MEASURES: Upper Extremity Functional Scale (UEFS): 40% impairment  UPPER EXTREMITY ROM:  WFLS for bilateral UE's   UPPER EXTREMITY MMT:     MMT Right eval Left eval  Shoulder flexion 4+/5 4+/5  Elbow flexion 4+/5 4+/5  Elbow extension 4+/5 4+/5  (Blank rows = not tested)  HAND FUNCTION: Grip strength: Right: 73.1 lbs; Left: 83.3 lbs  12/12/22-9 hole peg test R 27.97, L 26.84 R 70.5 lbs , 72.3 lbs   COORDINATION: 9 Hole Peg test: Right: 36.41 sec; Left: 28.94 sec Box and Blocks:  Right 49blocks, Left 50blocks  SENSATION: Light touch: Impaired  for hands   EDEMA: n/a   COGNITION: Overall cognitive status: Within functional limits for  tasks assessed however a little impulsive with testing  VISION: Subjective report: denies visual changes Baseline vision: Wears contacts  OBSERVATIONS: Pt appears anxious and moves quickly during testing.   TODAY'S TREATMENT:                                                                                                                               Pt reports he downloaded typing.com and is working on it at home [place] for right and left UE's for increased fine motor coordination, min difficulty/ v.c removing with in hand manipulation. Graded clothespins for  sustained pinch and functional reach, min v.c to avoid compensation 1-8# with right and left UE's Arm bike level 3 x 6 mins for conditioning  PATIENT EDUCATION: Education details: red theraband for shoulder abduction, biceps curls and triceps extension, 15 reps each see pt instructions Person educated: Patient Education method: Explanation, demonstration, verbal cues Education comprehension: verbalized understanding, returned demonstration  HOME EXERCISE PROGRAM: 12/12/22- green putty, coordination  GOALS: Goals reviewed with patient? Yes  SHORT TERM GOALS: Target date: 01/02/23  I with HEP  Goal status: INITIAL  2.  Pt will perform all basic ADLS modified independently  Goal status: INITIAL  3.  Pt will increase RUE grip strength by 5 lbs for increased functional use. Baseline: RUE 73.1lbs, LUE 83.3 lbs Goal status: INITIAL  4.  I with adapted strategies, and AE to increase safety and I with ADLS/ IADLs (safety for showering)  Goal status: INITIAL  5.  Pt will perform light home management modified independently demonstrating good safety awareness.  Goal status: INITIAL   LONG TERM GOALS: Target date: 02/04/23  I with updated HEP for bilateral UE strength   Goal status: INITIAL  2.  Pt will improve UEFS score to 35% impairment Baseline: 40% impairment Goal status: INITIAL  3.  Pt will  demonstrate improved RUE fine motor coordination as evidenced by decreasing 9 hole peg test score by 3 secs without drops Baseline: RUE 36.41, LUE 28.94 Goal status: INITIAL  ASSESSMENT:  CLINICAL IMPRESSION:  Pt is progressing towards goals. He report he is feeling better today and has had several infusions.Pt demonstrates improving fine motor coordination. PERFORMANCE DEFICITS: in functional skills including ADLs, IADLs, coordination, dexterity, sensation, strength, flexibility, Fine motor control, Gross motor control, mobility, balance, decreased knowledge of precautions, decreased knowledge of use of DME, and UE functional use, cognitive skills including safety awareness, and psychosocial skills including coping strategies, environmental adaptation, habits, interpersonal interactions, and routines and behaviors.   IMPAIRMENTS: are limiting patient from ADLs, IADLs, play, leisure, and social participation.   CO-MORBIDITIES: may have co-morbidities  that affects occupational performance. Patient will benefit from skilled OT to address above impairments and improve overall function.  REHAB POTENTIAL: Good  PLAN:  OT FREQUENCY: 3x/week plus eval (may drop down to 2x week depending on pt needs  OT DURATION: 8 weeks  PLANNED INTERVENTIONS: self care/ADL training, therapeutic exercise, therapeutic activity, neuromuscular re-education, manual therapy, passive range of motion, balance training, functional mobility training, splinting, ultrasound, paraffin, fluidotherapy, moist heat, cryotherapy, contrast bath, patient/family education, cognitive remediation/compensation, energy conservation, coping strategies training, DME and/or AE instructions, and Re-evaluation  RECOMMENDED OTHER SERVICES: n/a  CONSULTED AND AGREED WITH PLAN OF CARE: Patient  PLAN FOR NEXT SESSION:  ADL strategies,- with focus on safety, pt moves quickly, fine motor coordination, grip and pinch strength tasks; typing  activities   [place], [place] 12/17/2022, 11:05 AM

## 2022-12-18 ENCOUNTER — Telehealth: Payer: Self-pay | Admitting: Family Medicine

## 2022-12-18 NOTE — Telephone Encounter (Signed)
Appointment scheduled for my chart visit to go over form.  Dm/cma

## 2022-12-18 NOTE — Telephone Encounter (Signed)
Katie from [employer] called to let Dr Gena Fray know that she would be faxing [place] an Attending physician statement to be filled out, she said it will have her contact info and fax number on the form

## 2022-12-19 ENCOUNTER — Encounter: Payer: Self-pay | Admitting: Family Medicine

## 2022-12-19 ENCOUNTER — Ambulatory Visit: Payer: Medicare PPO | Admitting: Occupational Therapy

## 2022-12-19 ENCOUNTER — Encounter: Payer: Self-pay | Admitting: Occupational Therapy

## 2022-12-19 ENCOUNTER — Ambulatory Visit: Payer: Medicare PPO | Admitting: Physical Therapy

## 2022-12-19 ENCOUNTER — Telehealth (INDEPENDENT_AMBULATORY_CARE_PROVIDER_SITE_OTHER): Payer: Medicare PPO | Admitting: Family Medicine

## 2022-12-19 VITALS — Ht 70.0 in | Wt 193.0 lb

## 2022-12-19 DIAGNOSIS — R278 Other lack of coordination: Secondary | ICD-10-CM

## 2022-12-19 DIAGNOSIS — R2689 Other abnormalities of gait and mobility: Secondary | ICD-10-CM

## 2022-12-19 DIAGNOSIS — R2681 Unsteadiness on feet: Secondary | ICD-10-CM | POA: Diagnosis not present

## 2022-12-19 DIAGNOSIS — R208 Other disturbances of skin sensation: Secondary | ICD-10-CM

## 2022-12-19 DIAGNOSIS — G610 Guillain-Barre syndrome: Secondary | ICD-10-CM | POA: Diagnosis not present

## 2022-12-19 DIAGNOSIS — M6281 Muscle weakness (generalized): Secondary | ICD-10-CM | POA: Diagnosis not present

## 2022-12-19 NOTE — Progress Notes (Signed)
[facility] [address] [place] [address] Dept: [phone] Dept Fax: [phone]  Virtual Video Visit  I connected with Joe Greene on 12/19/22 at  2:00 PM EST by a video enabled telemedicine application and verified that I am speaking with the correct person using two identifiers.  Location patient: Home Location provider: Clinic Persons participating in the virtual visit: Patient, His wife [name]), and  Provider  I discussed the limitations of evaluation and management by telemedicine and the availability of in person appointments. The patient expressed understanding and agreed to proceed.  Chief Complaint  Patient presents with   Medical Management of Chronic Issues    Discuss form   SUBJECTIVE:  HPI: Joe Greene is a 71 y.o. male who presents for assistance with completion of a form in applying for long term disability benefits. 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 This condition started for him initially on Nov. 9th, following a URI about 2 weeks earlier. He underwent IVIG treatment from Nov. 14-18th. His symptoms were worsening in Dec., when he was admitted for plasma exchange. At his most recent hospitalization in early Jan., he was treated with another round of IVIG. He has since had an outpatient round of IVIG.   [name] continues to have significant weakness in the lower legs. He notes he has good periods (usually soon after the IVIG infusion) and bad times when he is almost completely helpless.  He is using a walker to ambulate, but at times requires the use of a wheel chair. When he is having bad days, he regularly needs almost total assistance with bathing, dressing, toileting, and transfers. He typically is able to feed himself. He is unable to drive.  Patient Active Problem List   Diagnosis Date Noted   Left-sided low back pain with left-sided  sciatica 12/11/2022   CIDP (chronic inflammatory demyelinating polyneuropathy) ([place]) 12/11/2022   Constipation 11/15/2022   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[place]) 11/02/2022   Acute sensory neuropathy 10/25/2022   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[place]) 10/18/2022   B12 deficiency 10/18/2022   Gait abnormality 10/18/2022   Pre-diabetes 09/25/2022   AAA (abdominal aortic aneurysm) ([place]) 09/24/2022   Essential hypertension 09/24/2022   Iron deficiency 04/04/2022   Hypertriglyceridemia 04/03/2022   Coronary artery calcification 01/18/2022   Candidal intertrigo    Emphysema of lung ([place]) 07/26/2021   Aortic atherosclerosis ([place]) 07/26/2021   Internal hemorrhoids 01/23/2021   Diverticula of colon 01/23/2021   Eczema 01/23/2021   Actinic keratoses 01/23/2021   Tobacco use 09/02/2020   Past Surgical History:  Procedure Laterality Date   FOOT SURGERY Right    HERNIA REPAIR Right    IR FLUORO GUIDE CV LINE RIGHT  10/25/2022   IR US GUIDE VASC ACCESS RIGHT  10/25/2022   KNEE ARTHROSCOPY Left    WISDOM TOOTH EXTRACTION     Family History  Problem Relation Age of Onset   Stroke Mother 79   AAA (abdominal aortic aneurysm) Mother    Cancer Father        Prostate   Colon cancer Neg Hx    Colon polyps Neg Hx    Esophageal cancer Neg Hx    Rectal cancer Neg Hx    Stomach cancer Neg Hx    Social History   Tobacco Use   Smoking status: Former    Packs/day: 2.00    Years: 50.00    Total pack years: 100.00  Types: Cigarettes    Quit date: 09/18/2022    Years since quitting: 0.2   Smokeless tobacco: Never  Vaping Use   Vaping Use: Never used  Substance Use Topics   Alcohol use: Yes    Alcohol/week: 3.0 standard drinks of alcohol    Types: 3 Standard drinks or equivalent per week    Comment: 2 or 3 drinks per week   Drug use: Never    Current Outpatient Medications:    acetaminophen (TYLENOL) 325 MG tablet, Take 1-2 tablets (325-650 mg total) by mouth  every 4 (four) hours as needed for mild pain., Disp: , Rfl:    aspirin EC 81 MG tablet, Take 81 mg by mouth in the morning., Disp: , Rfl:    Cyanocobalamin (B-12 PO), Take 1 tablet by mouth daily with lunch., Disp: , Rfl:    DOCUSATE SODIUM PO, Take 1 capsule by mouth daily as needed (constipation)., Disp: , Rfl:    DULoxetine (CYMBALTA) 30 MG capsule, Take 1 capsule (30 mg total) by mouth daily., Disp: 30 capsule, Rfl: 0   ferrous sulfate 325 (65 FE) MG EC tablet, Take 325 mg by mouth daily with lunch., Disp: , Rfl:    Fluocinonide (LIDEX EX), Apply 1 application  topically daily as needed (rash)., Disp: , Rfl:    gabapentin (NEURONTIN) 300 MG capsule, Take 2 capsules (600 mg total) by mouth 3 (three) times daily., Disp: 180 capsule, Rfl: 11   ibuprofen (ADVIL) 200 MG tablet, Take 400 mg by mouth daily as needed for headache or mild pain., Disp: , Rfl:    Multiple Vitamins-Minerals (CENTRUM SILVER) tablet, Take 1 tablet by mouth daily with lunch., Disp: , Rfl:    nicotine (NICODERM CQ - DOSED IN MG/24 HOURS) 14 mg/24hr patch, Place 1 patch (14 mg total) onto the skin daily., Disp: 28 patch, Rfl: 0   nicotine (NICODERM CQ - DOSED IN MG/24 HOURS) 21 mg/24hr patch, Place 21 mg onto the skin daily as needed (nicotine craving)., Disp: , Rfl:    Omega-3 Fatty Acids (FISH OIL PO), Take 1 capsule by mouth daily with lunch. When able to remember, Disp: , Rfl:    sildenafil (VIAGRA) 100 MG tablet, Take 100 mg by mouth daily as needed for erectile dysfunction., Disp: , Rfl:    DULoxetine (CYMBALTA) 60 MG capsule, Take 1 capsule (60 mg total) by mouth daily. (Patient not taking: Reported on 12/19/2022), Disp: 90 capsule, Rfl: 3 No Known Allergies  ROS: See pertinent positives and negatives per HPI.  OBSERVATIONS/OBJECTIVE:  VITALS per patient if applicable: Today's Vitals   12/19/22 1352  Weight: 193 lb (87.5 kg)  Height: 5\' 10"  (1.778 m)   Body mass index is 27.69 kg/m.   GENERAL: Alert and  oriented. Appears well and in no acute distress.  HEENT: Atraumatic. Eyes clear. No obvious abnormalities on inspection of external nose and ears.  NECK: Normal movements of the head and neck.  LUNGS: On inspection, no signs of respiratory distress. Breathing rate appears normal. No obvious gross SOB, gasping or wheezing, and no conversational dyspnea.  CV: No obvious cyanosis.  PSYCH/NEURO: Pleasant and cooperative. No obvious depression or anxiety. Speech and thought processing grossly intact.  ASSESSMENT AND PLAN:  Problem List Items Addressed This Visit       Nervous and Auditory   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[place]) - Primary    I completed the insurance forms with the assistance of Mr. Woelfel to answer questions regarding his physical capacities. He  is awaiting to hear back on a referral I recently placed to neurology with [facility]. In the meantime, he will continue to work with PT/OT and continue to follow with Dr. Krista Blue.      I discussed the assessment and treatment plan with the patient. The patient was provided an opportunity to ask questions and all were answered. The patient agreed with the plan and demonstrated an understanding of the instructions.   The patient was advised to call back or seek an in-person evaluation if the symptoms worsen or if the condition fails to improve as anticipated.  Return for Follow-up as scheduled.   Haydee Salter, MD

## 2022-12-19 NOTE — Assessment & Plan Note (Addendum)
I completed the insurance forms with the assistance of Mr. Croghan to answer questions regarding his physical capacities. He is awaiting to hear back on a referral I recently placed to neurology with [facility]. In the meantime, he will continue to work with PT/OT and continue to follow with Dr. Krista Blue.

## 2022-12-19 NOTE — Therapy (Signed)
OUTPATIENT PHYSICAL THERAPY NEURO THERAPY    Patient Name: Joe Greene MRN: [id number] DOB:1951-01-01, 72 y.o., male Today's Date: 12/19/2022   PCP: Arlester Marker, MD REFERRING PROVIDER: Marlowe Shores, PA-C  END OF SESSION:  PT End of Session - 12/19/22 1016     Visit Number 13    Number of Visits 25    Date for PT Re-Evaluation 01/13/23    Authorization Type [employer] [place]    Progress Note Due on Visit 10    PT Start Time 1016    PT Stop Time 1057    PT Time Calculation (min) 41 min    Equipment Utilized During Treatment Gait belt    Activity Tolerance Patient tolerated treatment well    Behavior During Therapy WFL for tasks assessed/performed                  Past Medical History:  Diagnosis Date   AAA (abdominal aortic aneurysm) ([place])    Cataract    forming    Guillain Barr syndrome ([place])    Past Surgical History:  Procedure Laterality Date   FOOT SURGERY Right    HERNIA REPAIR Right    IR FLUORO GUIDE CV LINE RIGHT  10/25/2022   IR US GUIDE VASC ACCESS RIGHT  10/25/2022   KNEE ARTHROSCOPY Left    WISDOM TOOTH EXTRACTION     Patient Active Problem List   Diagnosis Date Noted   Left-sided low back pain with left-sided sciatica 12/11/2022   CIDP (chronic inflammatory demyelinating polyneuropathy) ([place]) 12/11/2022   Constipation 11/15/2022   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[place]) 11/02/2022   Acute sensory neuropathy 10/25/2022   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[place]) 10/18/2022   B12 deficiency 10/18/2022   Gait abnormality 10/18/2022   Pre-diabetes 09/25/2022   AAA (abdominal aortic aneurysm) ([place]) 09/24/2022   Essential hypertension 09/24/2022   Iron deficiency 04/04/2022   Hypertriglyceridemia 04/03/2022   Coronary artery calcification 01/18/2022   Candidal intertrigo    Emphysema of lung ([place]) 07/26/2021   Aortic atherosclerosis ([place]) 07/26/2021   Internal hemorrhoids 01/23/2021   Diverticula of colon 01/23/2021   Eczema  01/23/2021   Actinic keratoses 01/23/2021   Tobacco use 09/02/2020    ONSET DATE: 11/06/2022 (referral date)  REFERRING DIAG: AIDP, GBS  THERAPY DIAG:  Muscle weakness (generalized)  Unsteadiness on feet  Other abnormalities of gait and mobility  Rationale for Evaluation and Treatment: Rehabilitation  SUBJECTIVE:  SUBJECTIVE STATEMENT: Pt reports legs feel a bit better today. Having some tingling in his legs but nothing major. No falls.   Pt accompanied by: self   PERTINENT HISTORY: Patient is in today for follow-up from his recent hospitalization. Mr. Doren was admitted at [facility] from 11/13-11/20/2023. He presented with right leg weakness and ascending numbness of his fingers and toes. He had extensive work up. 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 He was treated with a course of IVIG. He underwent some initial physical therapy. Due to some logistical challenges, the decision was made to discharge him to home, rather than move to inpatient rehab.   Since returning home, Mr. Serna notes he had fluctuating weakness in the legs. Therefore returned to MD and was hospitalized with new diagnosis of AIDP on 11/02/22, went to inpatient rehab and then returned home 11/10/22.   Other issues were identified during his hospital stay, including some hyperglycemia with an A1c of 6.5%, a mildly high TSH with a normal T4, elevated blood pressures, and some dyspnea issues. His blood pressure was high enough that he was started on losartan 25 mg daily. He has been trying to quit smoking now in light of his current issues. Additionally, the hospital noted his infrarenal AAA.  PAIN:  Are you having pain? Does not rate, but does report increased tingling in B feet, some pain in  BLEs  PRECAUTIONS: Fall  WEIGHT BEARING RESTRICTIONS: No  FALLS: Has patient fallen in last 6 months? No  LIVING ENVIRONMENT: Lives with: lives with their family Lives in: House/apartment Stairs: No Has following equipment at home: Environmental consultant - 2 wheeled, shower chair, and Grab bars  PLOF: Requires assistive device for independence, Needs assistance with ADLs, and Needs assistance with homemaking  PATIENT GOALS: "I want to get back to normal."   OBJECTIVE:   DIAGNOSTIC FINDINGS: Most recent nerve testing revealed AIDP  COGNITION: Overall cognitive status: Within functional limits for tasks assessed   SENSATION: Light touch: Impaired B numbness and tingling up to thighs and also in B hands  COORDINATION: WFL heel to shin     POSTURE: rounded shoulders and forward head Has to sit off of L hip at times due to pain     FUNCTIONAL TESTS: From eval  5 times sit to stand: 18.63 secs with B UE support  Timed up and go (TUG): 22.63 secs without AD with min A 10 meter walk test: 2.38 ft/sec with RW Berg Balance Scale: 33/56 (down from 42/56 when left CIR)    TODAY'S TREATMENT:               Ther Ex  SciFit multi-peaks level 9 for 8 minutes using BUE/BLEs for neural priming for reciprocal movement, dynamic cardiovascular warmup and global strengthening. RPE of 7-8/10 following activity   NMR  In // bars for improved BLE strength, single leg stability and LE coordination:  Eccentric lateral heel taps from 6" box, x16 reps on L side and x5 on R side (due to L knee pain) w/intermittent UE support. Pt reported feeling this mostly in hamstrings  Performed eccentric fwd taps from 6" box, x8 reps per side w/unilateral UE support. This direction more difficult for pt but he did not have knee pain  Stepping stones in various configurations, x3 reps down and back. Allowed pt to perform one rep w/single UE support and one rep without UE support for each configuration. CGA-min A throughout,  min cues to slow down and focus  on proper LE placement. Only one anterior LOB throughout requiring min A to correct  On floor mat for improved BLE strength, lateral weight shifting and core stability:  Alt tall > half kneels without UE support, x5 per side. Noted increased difficulty performing on L side due to R sided weakness  Progressed to D1 flexion in half kneel using 6# ball, x8 per side.  Increased difficulty performing on R side  Bird dogs, x8 per side. Min A for stabilization at pelvis  Alt plank walk outs w/pt on forearms, x5 per side. Pt demonstrated good foot placement and weight shift  RPE of 8/10 following floor activity  Leg presses for improved BLE strength, single leg stability and hip activation:  Double presses at 75# for 10 reps  Single leg presses at 55# for 8 reps per side. Increased difficulty w/RLE   RPE of 7/10 following session                        PATIENT EDUCATION: Education details: Continue HEP  Person educated: Patient Education method: Explanation and Verbal cues Education comprehension: verbalized understanding and needs further education  HOME EXERCISE PROGRAM: Access Code: [email or web address] URL: [email or web address] Date: 11/21/2022 Prepared by: Cameron Sprang  Exercises - Standing March with Counter Support  - 1-2 x daily - 5 x weekly - 1-2 sets - 5-10 reps - 3 hold - Standing Thoracic and Cervical Rotation with Reach at Wall  - 1-2 x daily - 5 x weekly - 1-2 sets - 3-5 reps - 5 hold - Mini Squat with Counter Support  - 1-2 x daily - 5 x weekly - 1-2 sets - 5-10 reps - 3 hold - Standing Hip Abduction with Counter Support  - 1 x daily - 7 x weekly - 1-2 sets - 10 reps - Standing Hip Extension with Counter Support  - 1 x daily - 7 x weekly - 1-2 sets - 10 reps - Supine Figure 4 Piriformis Stretch  - 2-3 x daily - 7 x weekly - 1 sets - 2-3 reps - 30 secs hold - Seated Piriformis Stretch with Trunk Bend  - 1 x daily - 7 x weekly - 3-4 reps -  30-60 second hold - Seated Diaphragmatic Breathing  - 1 x daily - 7 x weekly - 3 sets - 10 reps - 3-5 second hold  GOALS: Goals reviewed with patient? Yes  SHORT TERM GOALS: Target date: 12/15/22  Pt will be independent with initial HEP for improved strength, balance, transfers and gait. Baseline: Goal status: MET   2.  Pt will improve gait velocity to at least 2.75 ft/sec for improved gait efficiency and performance at S level  Baseline: 2.38 ft/sec with CGA and RW (1/3); 2.73 ft/s with RW and S*  Goal status: MET   3.  Pt will ambulate 500' w/LRAD and S* on 2MWT for improved endurance and efficiency with gait Baseline: 460' w/RW and CGA; 435' w/RW mod I Goal status: NOT MET    4. Pt will improve BERG balance score 4 points from baseline in order to indicate dec fall risk.   Baseline: 33/56; 36/56 on 2/2   Goal Status: PARTIALLY MET       LONG TERM GOALS: Target date: 01/13/2023   Pt will be independent with final HEP for improved strength, balance, transfers and gait. Baseline:  Goal status: INITIAL   2. Pt will improve gait velocity to at least 3.0 ft/sec with cane or  no AD for improved gait efficiency and performance at Supervision level  Baseline: 2.38 ft/sec with RW and CGA Goal status: INITIAL   3.  Pt will improve 5 x STS to less than or equal to 15 seconds without UE support to demonstrate improved functional strength and transfer efficiency.  Baseline: 18.63 with BUE support  Goal status: INITIAL   4.  Pt will improve normal TUG to less than or equal to 18 seconds without AD for improved functional mobility and decreased fall risk. Baseline: 15.97 sec (12/8) Goal status: INITIAL   5. Pt will ambulate 550' w/LRAD and mod I on 2MWT for improved endurance and efficiency with gait Baseline: 460' w/RW and CGA Goal status: REVISED  6. Pt will improve BERG balance score 8 points from baseline in order to indicate dec fall risk.   Baseline: 33/56  Goal Status:  INITIAL  7.  Pt will negotiate up/down 4 steps with single rail/cane, up/down ramp and curb and ambulate x 500' outdoors over unlevel paved surfaces at S level with cane or less in order to indicate improved community mobility.   Baseline:    Goal Status: INITIAL      ASSESSMENT:  CLINICAL IMPRESSION: Emphasis of skilled PT session on improved hip strength, single leg stability and core stability. Pt very challenged by exercises that isolate hip abductors and could benefit from continued strengthening. Pt able to perform tall > half kneel well, w/muscle twitching noted of RLE throughout. Pt does report feeling better in his legs this date and maintained breathing well throughout session. Continue POC.   OBJECTIVE IMPAIRMENTS: Abnormal gait, cardiopulmonary status limiting activity, decreased activity tolerance, decreased balance, decreased endurance, decreased knowledge of use of DME, decreased mobility, difficulty walking, decreased strength, impaired perceived functional ability, impaired sensation, impaired UE functional use, improper body mechanics, and postural dysfunction.   ACTIVITY LIMITATIONS: carrying, lifting, bending, standing, squatting, stairs, transfers, bathing, reach over head, and locomotion level  PARTICIPATION LIMITATIONS: meal prep, cleaning, laundry, driving, shopping, community activity, and yard work  PERSONAL FACTORS: Past/current experiences and 3+ comorbidities: see above  are also affecting patient's functional outcome.   REHAB POTENTIAL: Excellent  CLINICAL DECISION MAKING: Evolving/moderate complexity  EVALUATION COMPLEXITY: Moderate  PLAN:  PT FREQUENCY: 3x/week  PT DURATION: 8 weeks  PLANNED INTERVENTIONS: Therapeutic exercises, Therapeutic activity, Neuromuscular re-education, Balance training, Gait training, Patient/Family education, Self Care, Stair training, Vestibular training, DME instructions, and Aquatic Therapy  PLAN FOR NEXT SESSION:  How  is HEP? dynamic standing balance with small ranges initially with focus on control, compliant surfaces when able, gait with RW with obstacles and turns, activities without AD as able.  Ankle strategy, narrowed BOS/tandem. Staggered stance DL, KB swings, Bosu, wall bumps   Cruzita Lederer Chastin Garlitz, PT, DPT [facility] [address] [place], [place]  [address] Phone:  [phone] Fax:  [phone] 12/19/22, 11:59 AM

## 2022-12-19 NOTE — Therapy (Signed)
OUTPATIENT OCCUPATIONAL THERAPY NEURO TREATMENT   Patient Name: Joe Greene MRN: [id number] DOB:02-Apr-1951, 72 y.o., male Today's Date: 12/19/2022  PCP: Dr. Gena Greene REFERRING PROVIDER: Dr. Gena Greene  END OF SESSION:  OT End of Session - 12/19/22 1141     Visit Number 5    Number of Visits 25    Date for OT Re-Evaluation 02/04/23    Authorization Type [employer]    Authorization - Visit Number 5    Progress Note Due on Visit 10    OT Start Time 1103    OT Stop Time 1143    OT Time Calculation (min) 40 min    Activity Tolerance Patient tolerated treatment well    Behavior During Therapy [facility] for tasks assessed/performed              Past Medical History:  Diagnosis Date   AAA (abdominal aortic aneurysm) ([place])    Cataract    forming    Guillain Barr syndrome ([place])    Past Surgical History:  Procedure Laterality Date   FOOT SURGERY Right    HERNIA REPAIR Right    IR FLUORO GUIDE CV LINE RIGHT  10/25/2022   IR US GUIDE VASC ACCESS RIGHT  10/25/2022   KNEE ARTHROSCOPY Left    WISDOM TOOTH EXTRACTION     Patient Active Problem List   Diagnosis Date Noted   Left-sided low back pain with left-sided sciatica 12/11/2022   CIDP (chronic inflammatory demyelinating polyneuropathy) ([place]) 12/11/2022   Constipation 11/15/2022   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[place]) 11/02/2022   Acute sensory neuropathy 10/25/2022   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[place]) 10/18/2022   B12 deficiency 10/18/2022   Gait abnormality 10/18/2022   Pre-diabetes 09/25/2022   AAA (abdominal aortic aneurysm) ([place]) 09/24/2022   Essential hypertension 09/24/2022   Iron deficiency 04/04/2022   Hypertriglyceridemia 04/03/2022   Coronary artery calcification 01/18/2022   Candidal intertrigo    Emphysema of lung ([place]) 07/26/2021   Aortic atherosclerosis ([place]) 07/26/2021   Internal hemorrhoids 01/23/2021   Diverticula of colon 01/23/2021   Eczema 01/23/2021   Actinic keratoses  01/23/2021   Tobacco use 09/02/2020    ONSET DATE: 11/07/23  REFERRING DIAG:  Diagnosis  G35 (ICD-10-CM) - Multiple sclerosis  G61.0 (ICD-10-CM) - Guillain-Barre syndrome  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 He has been working with bolts at home to help improve hand function.   Pt accompanied by: self  PERTINENT HISTORY: Pt is a 72 yo M who presents with 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 PMH sig for GBS, HTN, COPD, MS.  Patient is in today for follow-up from his recent hospitalization. [name] was admitted at [facility] from 11/13-11/20/2023. He presented with right leg weakness and ascending numbness of his fingers and toes. He had extensive work up. 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 He was treated with a course of IVIG. He underwent some initial physical therapy. Due to some logistical challenges, the decision was made to discharge him to home, rather than move to inpatient rehab.   Since returning home, [name] notes he had fluctuating weakness in the legs. Therefore returned to MD and was  hospitalized with new diagnosis of AIDP on 11/02/22, went to inpatient rehab and then returned home 11/10/22.   Other issues were identified during his hospital stay, including some hyperglycemia with an A1c of 6.5%, a mildly high TSH with a normal T4, elevated blood pressures, and some dyspnea issues. His blood pressure was high enough that he was started on losartan 25 mg daily. He has been trying to quit smoking now in light of his current issues. Additionally, the  hospital noted his infrarenal AAA.   PRECAUTIONS: Fall  WEIGHT BEARING RESTRICTIONS: No  PAIN:  Are you having pain? Yes: NPRS scale: 3/10 Pain location: LE's Pain description: aching Aggravating factors: sitting Relieving factors: repositioning     FALLS: Has patient fallen in last 6 months? No  LIVING ENVIRONMENT: Lives with: lives with their family and lives with their spouse Lives in: House/apartment Stairs: No Has following equipment at home: Ramped entry  PLOF: Independent  PATIENT GOALS: Get back to using hands normally  OBJECTIVE:   HAND DOMINANCE: Right  ADLs: Overall ADLs: increased time required Transfers/ambulation related to ADLs:uses walker  Eating: mod I Grooming: mod I UB Dressing: set up LB Dressing: set up Toileting: mod I with walker Bathing: walk in shower with grab bar has 3 in 1 but does not use, mod A  Tub Shower transfers: min A Equipment:  3 in 1  IADLs: Shopping: dependent Light housekeeping: dependent Meal Prep: does not Sport and exercise psychologist mobility: supervision Medication management: pt's wife assists Doctor, hospital: pt assists but difficulty writing Handwriting: 100% legible  MOBILITY STATUS:  uses walker  POSTURE COMMENTS:  No Significant postural limitations    FUNCTIONAL OUTCOME MEASURES: Upper Extremity Functional Scale (UEFS): 40% impairment  UPPER EXTREMITY ROM:  WFLS for bilateral UE's   UPPER EXTREMITY MMT:     MMT Right eval Left eval  Shoulder flexion 4+/5 4+/5  Elbow flexion 4+/5 4+/5  Elbow extension 4+/5 4+/5  (Blank rows = not tested)  HAND FUNCTION: Grip strength: Right: 73.1 lbs; Left: 83.3 lbs  12/12/22-9 hole peg test R 27.97, L 26.84 R 70.5 lbs , 72.3 lbs   COORDINATION: 9 Hole Peg test: Right: 36.41 sec; Left: 28.94 sec Box and Blocks:  Right 49blocks, Left 50blocks  SENSATION: Light touch: Impaired  for hands   EDEMA: n/a   COGNITION: Overall cognitive status: Within functional  limits for tasks assessed however a little impulsive with testing  VISION: Subjective report: denies visual changes Baseline vision: Wears contacts  OBSERVATIONS: Pt appears anxious and moves quickly during testing.   TODAY'S TREATMENT: 12/19/22                                                                                                                             Reviewed and added to red theraband HEP, see education 15 reps each, min v.c Arm bike level 4 x 8 mins for conditioning Copying small peg design with left and right UE's  while  holding several pegs in hand to place, min-mod difficulty with in hand manipulation, then removing with in hand manipulation  PATIENT EDUCATION: Education details: reviewed theraband for shoulder abduction, biceps curls and triceps extension, 15 reps each see pt instructions, added shoulder flexion and extension to HEP Person educated: Patient Education method: Explanation, demonstration, verbal cues Education comprehension: verbalized understanding, returned demonstration  Access Code: [email or web address] URL: [email or web address] Date: 12/19/2022 Prepared by: Keene Breath  Exercises - Seated Shoulder Front Raise with Resistance  - 1 x daily - 7 x weekly - 3 sets - 15 reps - Seated Shoulder Row with Resistance Anchored at Feet  - 1 x daily - 7 x weekly - 1 sets - 15 reps - Seated Shoulder Extension with Resistance  - 1 x daily - 7 x weekly - 3 sets - 15 reps HOME EXERCISE PROGRAM: 12/12/22- green putty, coordination 12/17/22- red theraband , additions on 12/19/22 Access Code: [email or web address] URL: [email or web address] Date: 12/19/2022 Prepared by: Keene Breath  Exercises - Seated Shoulder Front Raise with Resistance  - 1 x daily - 7 x weekly - 3 sets - 15 reps - Seated Shoulder Row with Resistance Anchored at Feet  - 1 x daily - 7 x weekly - 1 sets - 15 reps - Seated Shoulder Extension with Resistance  - 1 x daily - 7 x weekly - 3 sets  - 15 reps  GOALS: Goals reviewed with patient? Yes  SHORT TERM GOALS: Target date: 01/02/23  I with HEP  Goal status: INITIAL  2.  Pt will perform all basic ADLS modified independently  Goal status: INITIAL  3.  Pt will increase RUE grip strength by 5 lbs for increased functional use. Baseline: RUE 73.1lbs, LUE 83.3 lbs Goal status: INITIAL  4.  I with adapted strategies, and AE to increase safety and I with ADLS/ IADLs (safety for showering)  Goal status: INITIAL  5.  Pt will perform light home management modified independently demonstrating good safety awareness.  Goal status: INITIAL   LONG TERM GOALS: Target date: 02/04/23  I with updated HEP for bilateral UE strength   Goal status: INITIAL  2.  Pt will improve UEFS score to 35% impairment Baseline: 40% impairment Goal status: INITIAL  3.  Pt will demonstrate improved RUE fine motor coordination as evidenced by decreasing 9 hole peg test score by 3 secs without drops Baseline: RUE 36.41, LUE 28.94 Goal status: INITIAL  ASSESSMENT:  CLINICAL IMPRESSION:  Pt is progressing towards goals. He reports his hands are doing better and he feels the best he has today.PERFORMANCE DEFICITS: in functional skills including ADLs, IADLs, coordination, dexterity, sensation, strength, flexibility, Fine motor control, Gross motor control, mobility, balance, decreased knowledge of precautions, decreased knowledge of use of DME, and UE functional use, cognitive skills including safety awareness, and psychosocial skills including coping strategies, environmental adaptation, habits, interpersonal interactions, and routines and behaviors.   IMPAIRMENTS: are limiting patient from ADLs, IADLs, play, leisure, and social participation.   CO-MORBIDITIES: may have co-morbidities  that affects occupational performance. Patient will benefit from skilled OT to address above impairments and improve overall function.  REHAB POTENTIAL:  Good  PLAN:  OT FREQUENCY: 3x/week plus eval (may drop down to 2x week depending on pt needs  OT DURATION: 8 weeks  PLANNED INTERVENTIONS: self care/ADL training, therapeutic exercise, therapeutic activity, neuromuscular re-education, manual therapy, passive range of motion, balance training, functional mobility training, splinting, ultrasound, paraffin, fluidotherapy, moist heat, cryotherapy, contrast bath, patient/family education, cognitive remediation/compensation, energy conservation,  coping strategies training, DME and/or AE instructions, and Re-evaluation  RECOMMENDED OTHER SERVICES: n/a  CONSULTED AND AGREED WITH PLAN OF CARE: Patient  PLAN FOR NEXT SESSION:  ADL strategies,- with focus on safety, pt moves quickly, fine motor coordination, grip and pinch strength tasks; typing activities   [place], [place] 12/19/2022, 11:42 AM

## 2022-12-21 ENCOUNTER — Ambulatory Visit: Payer: Medicare PPO | Admitting: Occupational Therapy

## 2022-12-21 ENCOUNTER — Ambulatory Visit: Payer: Medicare PPO | Admitting: Physical Therapy

## 2022-12-21 DIAGNOSIS — R2681 Unsteadiness on feet: Secondary | ICD-10-CM

## 2022-12-21 DIAGNOSIS — M6281 Muscle weakness (generalized): Secondary | ICD-10-CM

## 2022-12-21 DIAGNOSIS — G610 Guillain-Barre syndrome: Secondary | ICD-10-CM | POA: Diagnosis not present

## 2022-12-21 DIAGNOSIS — R2689 Other abnormalities of gait and mobility: Secondary | ICD-10-CM

## 2022-12-21 DIAGNOSIS — R208 Other disturbances of skin sensation: Secondary | ICD-10-CM | POA: Diagnosis not present

## 2022-12-21 DIAGNOSIS — R278 Other lack of coordination: Secondary | ICD-10-CM

## 2022-12-21 NOTE — Therapy (Signed)
OUTPATIENT OCCUPATIONAL THERAPY NEURO TREATMENT   Patient Name: Joe Greene MRN: [id number] DOB:[birth year], 72 y.o., male Today's Date: 12/21/2022  PCP: Dr. Gena Fray REFERRING PROVIDER: Dr. Gena Fray  END OF SESSION:  OT End of Session - 12/21/22 1230     Visit Number 6    Number of Visits 25    Date for OT Re-Evaluation 02/04/23    Authorization Type [employer]    Authorization - Visit Number 6    Progress Note Due on Visit 10    OT Start Time 1147    OT Stop Time 1227    OT Time Calculation (min) 40 min    Activity Tolerance Patient tolerated treatment well    Behavior During Therapy [facility] for tasks assessed/performed               Past Medical History:  Diagnosis Date   AAA (abdominal aortic aneurysm) ([place])    Cataract    forming    Guillain Barr syndrome [facility])    Past Surgical History:  Procedure Laterality Date   FOOT SURGERY Right    HERNIA REPAIR Right    IR FLUORO GUIDE CV LINE RIGHT  10/25/2022   IR US GUIDE VASC ACCESS RIGHT  10/25/2022   KNEE ARTHROSCOPY Left    WISDOM TOOTH EXTRACTION     Patient Active Problem List   Diagnosis Date Noted   Left-sided low back pain with left-sided sciatica 12/11/2022   CIDP (chronic inflammatory demyelinating polyneuropathy) ([place]) 12/11/2022   Constipation 11/15/2022   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[place]) 11/02/2022   Acute sensory neuropathy 10/25/2022   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[place]) 10/18/2022   B12 deficiency 10/18/2022   Gait abnormality 10/18/2022   Pre-diabetes 09/25/2022   AAA (abdominal aortic aneurysm) ([place]) 09/24/2022   Essential hypertension 09/24/2022   Iron deficiency 04/04/2022   Hypertriglyceridemia 04/03/2022   Coronary artery calcification 01/18/2022   Candidal intertrigo    Emphysema of lung ([place]) 07/26/2021   Aortic atherosclerosis ([place]) 07/26/2021   Internal hemorrhoids 01/23/2021   Diverticula of colon 01/23/2021   Eczema 01/23/2021   Actinic keratoses  01/23/2021   Tobacco use 09/02/2020    ONSET DATE: 11/07/23  REFERRING DIAG:  Diagnosis  G35 (ICD-10-CM) - Multiple sclerosis  G61.0 (ICD-10-CM) - Guillain-Barre syndrome  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 (ICD-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 PMH sig for GBS, HTN, COPD, MS.  Patient is in today for follow-up from his recent hospitalization. Joe Greene was admitted at [facility] from 11/13-11/20/2023. He presented with right leg weakness and ascending numbness of his fingers and toes. He had extensive work up. 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 He was treated with a course of IVIG. He underwent some initial physical therapy. Due to some logistical challenges, the decision was made to discharge him to home, rather than move to inpatient rehab.   Since returning home, Joe Greene notes he had fluctuating weakness in the legs. Therefore returned to MD and was hospitalized with  new diagnosis of AIDP on 11/02/22, went to inpatient rehab and then returned home 11/10/22.   Other issues were identified during his hospital stay, including some hyperglycemia with an A1c of 6.5%, a mildly high TSH with a normal T4, elevated blood pressures, and some dyspnea issues. His blood pressure was high enough that he was started on losartan 25 mg daily. He has been trying to quit smoking now in light  of his current issues. Additionally, the hospital noted his infrarenal AAA.   PRECAUTIONS: Fall  WEIGHT BEARING RESTRICTIONS: No  PAIN:  Are you having pain? NO   FALLS: Has patient fallen in last 6 months? No  LIVING ENVIRONMENT: Lives with: lives with their family and lives with their spouse Lives in: House/apartment Stairs: No Has following equipment at home: Ramped entry  PLOF: Independent  PATIENT GOALS: Get back to using hands normally  OBJECTIVE:   HAND DOMINANCE: Right  ADLs: Overall ADLs: increased time required Transfers/ambulation related to ADLs:uses walker  Eating: mod I Grooming: mod I UB Dressing: set up LB Dressing: set up Toileting: mod I with walker Bathing: walk in shower with grab bar has 3 in 1 but does not use, mod A  Tub Shower transfers: min A Equipment:  3 in 1  IADLs: Shopping: dependent Light housekeeping: dependent Meal Prep: does not Sport and exercise psychologist mobility: supervision Medication management: pt's wife assists Doctor, hospital: pt assists but difficulty writing Handwriting: 100% legible  MOBILITY STATUS:  uses walker  POSTURE COMMENTS:  No Significant postural limitations    FUNCTIONAL OUTCOME MEASURES: Upper Extremity Functional Scale (UEFS): 40% impairment  UPPER EXTREMITY ROM:  WFLS for bilateral UE's   UPPER EXTREMITY MMT:     MMT Right eval Left eval  Shoulder flexion 4+/5 4+/5  Elbow flexion 4+/5 4+/5  Elbow extension 4+/5 4+/5  (Blank rows = not tested)  HAND FUNCTION: Grip strength: Right: 73.1 lbs; Left: 83.3 lbs  12/12/22-9 hole peg test R 27.97, L 26.84 R 70.5 lbs , 72.3 lbs   COORDINATION: 9 Hole Peg test: Right: 36.41 sec; Left: 28.94 sec Box and Blocks:  Right 49blocks, Left 50blocks  SENSATION: Light touch: Impaired  for hands   EDEMA: n/a   COGNITION: Overall cognitive status: Within functional limits for tasks assessed however a little impulsive with testing  VISION: Subjective  report: denies visual changes Baseline vision: Wears contacts  OBSERVATIONS: Pt appears anxious and moves quickly during testing.   TODAY'S TREATMENT:   - Therapeutic activities completed for duration as noted below including:  Pt participated in games of Operation Perfection mash+ups x 4 trials with 2 medium and 2 hard puzzle cards for fine motor coordination and pinch strength. Pt placed pieces using affected R hand and grabbing pieces on the outsidex 2 and with use of tweezers x2 within 60 seconds.   Pt participated in games of Operation Perfection mash+ups x 4 trials with 2 medium and 2 hard puzzle cards for fine motor coordination and pinch strength. Pt placed pieces using affected L hand and grabbing pieces on the outside x 2 with use of tweezers x 2 missing 1 puzzle completion in 60 seconds. Pt completed simulated fish hook tying/knotting for B fine motor coordination. 2 lb weighted dowel in shoulder flexion and horizontal abduction and adduction x 10 each as well as simulated raking x 2 min for strength and endurance.  PATIENT EDUCATION: Education details: reviewed theraband for shoulder abduction, biceps curls and triceps extension, 15 reps each see pt instructions, added  shoulder flexion and extension to HEP Person educated: Patient Education method: Explanation, demonstration, verbal cues Education comprehension: verbalized understanding, returned demonstration  Access Code: [email or web address] URL: [email or web address] Date: 12/19/2022 Prepared by: Theone Murdoch  Exercises - Seated Shoulder Front Raise with Resistance  - 1 x daily - 7 x weekly - 3 sets - 15 reps - Seated Shoulder Row with Resistance Anchored at Feet  - 1 x daily - 7 x weekly - 1 sets - 15 reps - Seated Shoulder Extension with Resistance  - 1 x daily - 7 x weekly - 3 sets - 15 reps HOME EXERCISE PROGRAM: 12/12/22- green putty, coordination 12/17/22- red theraband , additions on 12/19/22 Access Code:  [email or web address] URL: [email or web address] Date: 12/19/2022 Prepared by: Theone Murdoch  Exercises - Seated Shoulder Front Raise with Resistance  - 1 x daily - 7 x weekly - 3 sets - 15 reps - Seated Shoulder Row with Resistance Anchored at Feet  - 1 x daily - 7 x weekly - 1 sets - 15 reps - Seated Shoulder Extension with Resistance  - 1 x daily - 7 x weekly - 3 sets - 15 reps  GOALS: Goals reviewed with patient? Yes  SHORT TERM GOALS: Target date: 01/02/23  I with HEP  Goal status: INITIAL  2.  Pt will perform all basic ADLS modified independently  Goal status: INITIAL  3.  Pt will increase RUE grip strength by 5 lbs for increased functional use. Baseline: RUE 73.1lbs, LUE 83.3 lbs Goal status: INITIAL  4.  I with adapted strategies, and AE to increase safety and I with ADLS/ IADLs (safety for showering)  Goal status: INITIAL  5.  Pt will perform light home management modified independently demonstrating good safety awareness.  Goal status: INITIAL   LONG TERM GOALS: Target date: 02/04/23  I with updated HEP for bilateral UE strength   Goal status: INITIAL  2.  Pt will improve UEFS score to 35% impairment Baseline: 40% impairment Goal status: INITIAL  3.  Pt will demonstrate improved RUE fine motor coordination as evidenced by decreasing 9 hole peg test score by 3 secs without drops Baseline: RUE 36.41, LUE 28.94  1/31: R 27.97, L 26.84 Goal status: INITIAL  ASSESSMENT:  CLINICAL IMPRESSION:  Pt is progressing towards goals. Continues to benefit from skilled OT services to maximize independence and safety with ADLs and IADLs.   PERFORMANCE DEFICITS: in functional skills including ADLs, IADLs, coordination, dexterity, sensation, strength, flexibility, Fine motor control, Gross motor control, mobility, balance, decreased knowledge of precautions, decreased knowledge of use of DME, and UE functional use, cognitive skills including safety awareness, and  psychosocial skills including coping strategies, environmental adaptation, habits, interpersonal interactions, and routines and behaviors.   IMPAIRMENTS: are limiting patient from ADLs, IADLs, play, leisure, and social participation.   CO-MORBIDITIES: may have co-morbidities  that affects occupational performance. Patient will benefit from skilled OT to address above impairments and improve overall function.  REHAB POTENTIAL: Good  PLAN:  OT FREQUENCY: 3x/week plus eval (may drop down to 2x week depending on pt needs  OT DURATION: 8 weeks  PLANNED INTERVENTIONS: self care/ADL training, therapeutic exercise, therapeutic activity, neuromuscular re-education, manual therapy, passive range of motion, balance training, functional mobility training, splinting, ultrasound, paraffin, fluidotherapy, moist heat, cryotherapy, contrast bath, patient/family education, cognitive remediation/compensation, energy conservation, coping strategies training, DME and/or AE instructions, and Re-evaluation  RECOMMENDED OTHER SERVICES: n/a  CONSULTED AND AGREED WITH PLAN OF CARE: Patient  PLAN FOR NEXT SESSION:  ADL strategies,-  with focus on safety, pt moves quickly, fine motor coordination, grip and pinch strength tasks   Dennis Bast, OT 12/21/2022, 12:31 PM

## 2022-12-21 NOTE — Therapy (Signed)
OUTPATIENT PHYSICAL THERAPY NEURO THERAPY    Patient Name: Joe Greene MRN: [id number] DOB:10/02/1951, 72 y.o., male Today's Date: 12/21/2022   PCP: Arlester Marker, MD REFERRING PROVIDER: Marlowe Shores, PA-C  END OF SESSION:  PT End of Session - 12/21/22 1104     Visit Number 14    Number of Visits 25    Date for PT Re-Evaluation 01/13/23    Authorization Type [employer] [place]    Progress Note Due on Visit 10    PT Start Time 1103    PT Stop Time [id number]    PT Time Calculation (min) 42 min    Equipment Utilized During Treatment Gait belt    Activity Tolerance Patient tolerated treatment well    Behavior During Therapy WFL for tasks assessed/performed                   Past Medical History:  Diagnosis Date   AAA (abdominal aortic aneurysm) ([place])    Cataract    forming    Guillain Barr syndrome ([place])    Past Surgical History:  Procedure Laterality Date   FOOT SURGERY Right    HERNIA REPAIR Right    IR FLUORO GUIDE CV LINE RIGHT  10/25/2022   IR US GUIDE VASC ACCESS RIGHT  10/25/2022   KNEE ARTHROSCOPY Left    WISDOM TOOTH EXTRACTION     Patient Active Problem List   Diagnosis Date Noted   Left-sided low back pain with left-sided sciatica 12/11/2022   CIDP (chronic inflammatory demyelinating polyneuropathy) ([place]) 12/11/2022   Constipation 11/15/2022   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[place]) 11/02/2022   Acute sensory neuropathy 10/25/2022   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[place]) 10/18/2022   B12 deficiency 10/18/2022   Gait abnormality 10/18/2022   Pre-diabetes 09/25/2022   AAA (abdominal aortic aneurysm) ([place]) 09/24/2022   Essential hypertension 09/24/2022   Iron deficiency 04/04/2022   Hypertriglyceridemia 04/03/2022   Coronary artery calcification 01/18/2022   Candidal intertrigo    Emphysema of lung ([place]) 07/26/2021   Aortic atherosclerosis ([place]) 07/26/2021   Internal hemorrhoids 01/23/2021   Diverticula of colon 01/23/2021   Eczema  01/23/2021   Actinic keratoses 01/23/2021   Tobacco use 09/02/2020    ONSET DATE: 11/06/2022 (referral date)  REFERRING DIAG: AIDP, GBS  THERAPY DIAG:  Muscle weakness (generalized)  Other abnormalities of gait and mobility  Unsteadiness on feet  Rationale for Evaluation and Treatment: Rehabilitation  SUBJECTIVE:  SUBJECTIVE STATEMENT: Pt reports doing well today, was "worn out" following last session but no pain. Rated fatigue as 2/10 today.   Pt accompanied by: self and wife in lobby   PERTINENT HISTORY: Patient is in today for follow-up from his recent hospitalization. [name] was admitted at [facility] from 11/13-11/20/2023. He presented with right leg weakness and ascending numbness of his fingers and toes. He had extensive work up. 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 He was treated with a course of IVIG. He underwent some initial physical therapy. Due to some logistical challenges, the decision was made to discharge him to home, rather than move to inpatient rehab.   Since returning home, [name] notes he had fluctuating weakness in the legs. Therefore returned to MD and was hospitalized with new diagnosis of AIDP on 11/02/22, went to inpatient rehab and then returned home 11/10/22.   Other issues were identified during his hospital stay, including some hyperglycemia with an A1c of 6.5%, a mildly high TSH with a normal T4, elevated blood pressures, and some dyspnea issues. His blood pressure was high enough that he was started on losartan 25 mg daily. He has been trying to quit smoking now in light of his current issues. Additionally, the hospital noted his infrarenal AAA.  PAIN:  Are you having pain? Does not rate, but does report increased tingling in B feet, some  pain in BLEs  PRECAUTIONS: Fall  WEIGHT BEARING RESTRICTIONS: No  FALLS: Has patient fallen in last 6 months? No  LIVING ENVIRONMENT: Lives with: lives with their family Lives in: House/apartment Stairs: No Has following equipment at home: Environmental consultant - 2 wheeled, shower chair, and Grab bars  PLOF: Requires assistive device for independence, Needs assistance with ADLs, and Needs assistance with homemaking  PATIENT GOALS: "I want to get back to normal."   OBJECTIVE:   DIAGNOSTIC FINDINGS: Most recent nerve testing revealed AIDP  COGNITION: Overall cognitive status: Within functional limits for tasks assessed   SENSATION: Light touch: Impaired B numbness and tingling up to thighs and also in B hands  COORDINATION: WFL heel to shin     POSTURE: rounded shoulders and forward head Has to sit off of L hip at times due to pain    FUNCTIONAL TESTS: From eval  5 times sit to stand: 18.63 secs with B UE support  Timed up and go (TUG): 22.63 secs without AD with min A 10 meter walk test: 2.38 ft/sec with RW Berg Balance Scale: 33/56 (down from 42/56 when left CIR)    TODAY'S TREATMENT:               Gait Training   Gait pattern: step through pattern, decreased arm swing- Left, lateral lean- Right, and wide BOS Distance walked: 115' Assistive device utilized: Lobbyist Level of assistance: SBA Comments: Min cues for proper sequencing of cane and to slow down, as pt taking very large steps and moving too quickly. Pt frequently kicking legs of cane but no instability noted   Gait pattern: step through pattern, decreased arm swing- Left, and lateral lean- Right Distance walked: 230' indoors and over curb, gravel and incline/decline outdoors Assistive device utilized:  SPC with quad tip  Level of assistance: SBA Comments: Continued cues for proper sequencing as pt placing cane w/ipsilateral leg rather than reciprocal sequence. Pt does take extra large step w/LLE, so  cued for smaller step for improved stability. Pt able to ambulate over gravel and navigate  curb well with no instability. Pt prefers this cane over the other ones trialed in clinic. Provided education on where to purchase cane and rubber tip (pt prefers [employer])    Gait pattern: step through pattern, decreased arm swing- Left, and lateral lean- Right Distance walked: 115' Assistive device utilized: Single point cane Level of assistance: CGA Comments: Pt reports not feeling as much stable with this cane and demonstrated decreased gait speed and stability. Recommending SPC w/quad tip at this time  NMR Wall bumps, x20 reps, for improved core stability, hip extension strength and immediate standing balance. Pt more challenged when feet further from wall but performed well without LOB  In // bars for improved ankle strategy, single leg stability and BLE strength:  On bosu (blue side), alt step ups w/contralateral march with intermittent UE support, x10 per side. Pt w/increased difficulty performing on R side > L side. CGA-min A throughout  On bosu (black side), standing without UE support x2 minutes. Pt initially very shaky but quickly stabilized well with cues to utilize ankle strategy.  Progressed to standing w/horizontal head turns, x8 per side. Pt very challenged by this and required UE support and min A to stabilize  Finally progressed to mini squats on bosu, x20 reps. Pt losing balance posterolaterally to R side but no major LOB noted    RPE of 5/10 following session   PATIENT EDUCATION: Education details: Continue HEP, where to buy [facility] and quad tip  Person educated: Patient Education method: Explanation and Verbal cues Education comprehension: verbalized understanding and needs further education  HOME EXERCISE PROGRAM: Access Code: [email or web address] URL: [email or web address] Date: 11/21/2022 Prepared by: Cameron Sprang  Exercises - Standing March with Counter Support  - 1-2 x  daily - 5 x weekly - 1-2 sets - 5-10 reps - 3 hold - Standing Thoracic and Cervical Rotation with Reach at Wall  - 1-2 x daily - 5 x weekly - 1-2 sets - 3-5 reps - 5 hold - Mini Squat with Counter Support  - 1-2 x daily - 5 x weekly - 1-2 sets - 5-10 reps - 3 hold - Standing Hip Abduction with Counter Support  - 1 x daily - 7 x weekly - 1-2 sets - 10 reps - Standing Hip Extension with Counter Support  - 1 x daily - 7 x weekly - 1-2 sets - 10 reps - Supine Figure 4 Piriformis Stretch  - 2-3 x daily - 7 x weekly - 1 sets - 2-3 reps - 30 secs hold - Seated Piriformis Stretch with Trunk Bend  - 1 x daily - 7 x weekly - 3-4 reps - 30-60 second hold - Seated Diaphragmatic Breathing  - 1 x daily - 7 x weekly - 3 sets - 10 reps - 3-5 second hold  GOALS: Goals reviewed with patient? Yes  SHORT TERM GOALS: Target date: 12/15/22  Pt will be independent with initial HEP for improved strength, balance, transfers and gait. Baseline: Goal status: MET   2.  Pt will improve gait velocity to at least 2.75 ft/sec for improved gait efficiency and performance at S level  Baseline: 2.38 ft/sec with CGA and RW (1/3); 2.73 ft/s with RW and S*  Goal status: MET   3.  Pt will ambulate 500' w/LRAD and S* on 2MWT for improved endurance and efficiency with gait Baseline: 460' w/RW and CGA; 435' w/RW mod I Goal status: NOT MET    4. Pt will improve BERG balance score 4 points from baseline  in order to indicate dec fall risk.   Baseline: 33/56; 36/56 on 2/2   Goal Status: PARTIALLY MET       LONG TERM GOALS: Target date: 01/13/2023   Pt will be independent with final HEP for improved strength, balance, transfers and gait. Baseline:  Goal status: INITIAL   2. Pt will improve gait velocity to at least 3.0 ft/sec with cane or no AD for improved gait efficiency and performance at Supervision level  Baseline: 2.38 ft/sec with RW and CGA Goal status: INITIAL   3.  Pt will improve 5 x STS to less than or equal to 15  seconds without UE support to demonstrate improved functional strength and transfer efficiency.  Baseline: 18.63 with BUE support  Goal status: INITIAL   4.  Pt will improve normal TUG to less than or equal to 18 seconds without AD for improved functional mobility and decreased fall risk. Baseline: 15.97 sec (12/8) Goal status: INITIAL   5. Pt will ambulate 550' w/LRAD and mod I on 2MWT for improved endurance and efficiency with gait Baseline: 460' w/RW and CGA Goal status: REVISED  6. Pt will improve BERG balance score 8 points from baseline in order to indicate dec fall risk.   Baseline: 33/56  Goal Status: INITIAL  7.  Pt will negotiate up/down 4 steps with single rail/cane, up/down ramp and curb and ambulate x 500' outdoors over unlevel paved surfaces at S level with cane or less in order to indicate improved community mobility.   Baseline:    Goal Status: INITIAL      ASSESSMENT:  CLINICAL IMPRESSION: Emphasis of skilled PT session on gait training w/cane, single leg stability and BLE strength. Pt prefers using SPC w/ quad tip and states he would like to return to church but does not want to use RW. Pt ambulated well w/cane but will benefit from continued practice for proper reciprocal sequencing. Pt very challenged by bosu activities and tends to over-rely on hip strategy but can elicit ankle strategy w/min cues. Continue POC.   OBJECTIVE IMPAIRMENTS: Abnormal gait, cardiopulmonary status limiting activity, decreased activity tolerance, decreased balance, decreased endurance, decreased knowledge of use of DME, decreased mobility, difficulty walking, decreased strength, impaired perceived functional ability, impaired sensation, impaired UE functional use, improper body mechanics, and postural dysfunction.   ACTIVITY LIMITATIONS: carrying, lifting, bending, standing, squatting, stairs, transfers, bathing, reach over head, and locomotion level  PARTICIPATION LIMITATIONS: meal prep,  cleaning, laundry, driving, shopping, community activity, and yard work  PERSONAL FACTORS: Past/current experiences and 3+ comorbidities: see above  are also affecting patient's functional outcome.   REHAB POTENTIAL: Excellent  CLINICAL DECISION MAKING: Evolving/moderate complexity  EVALUATION COMPLEXITY: Moderate  PLAN:  PT FREQUENCY: 3x/week  PT DURATION: 8 weeks  PLANNED INTERVENTIONS: Therapeutic exercises, Therapeutic activity, Neuromuscular re-education, Balance training, Gait training, Patient/Family education, Self Care, Stair training, Vestibular training, DME instructions, and Aquatic Therapy  PLAN FOR NEXT SESSION:  How is HEP? Did he get cane? dynamic standing balance with small ranges initially with focus on control, compliant surfaces when able, gait with RW with obstacles and turns, activities without AD as able.  Ankle strategy, narrowed BOS/tandem. Staggered stance DL, KB swings, Bosu, boxing   [place], PT, [place] [address] [place], [place]  [address] Phone:  [phone] Fax:  [phone] 12/21/22, 11:49 AM

## 2022-12-24 ENCOUNTER — Encounter: Payer: Self-pay | Admitting: Family Medicine

## 2022-12-24 ENCOUNTER — Ambulatory Visit: Payer: Medicare PPO | Admitting: Physical Therapy

## 2022-12-24 ENCOUNTER — Ambulatory Visit: Payer: Medicare PPO | Admitting: Occupational Therapy

## 2022-12-24 ENCOUNTER — Encounter: Payer: Self-pay | Admitting: Neurology

## 2022-12-24 ENCOUNTER — Encounter: Payer: Self-pay | Admitting: Occupational Therapy

## 2022-12-24 DIAGNOSIS — R278 Other lack of coordination: Secondary | ICD-10-CM

## 2022-12-24 DIAGNOSIS — M6281 Muscle weakness (generalized): Secondary | ICD-10-CM

## 2022-12-24 DIAGNOSIS — R2689 Other abnormalities of gait and mobility: Secondary | ICD-10-CM

## 2022-12-24 DIAGNOSIS — R2681 Unsteadiness on feet: Secondary | ICD-10-CM

## 2022-12-24 DIAGNOSIS — R208 Other disturbances of skin sensation: Secondary | ICD-10-CM

## 2022-12-24 DIAGNOSIS — G610 Guillain-Barre syndrome: Secondary | ICD-10-CM | POA: Diagnosis not present

## 2022-12-24 NOTE — Therapy (Signed)
OUTPATIENT OCCUPATIONAL THERAPY NEURO TREATMENT   Patient Name: Joe Greene MRN: [id number] DOB:[birth year], 72 y.o., male Today's Date: 12/24/2022  PCP: Dr. Gena Fray REFERRING PROVIDER: Dr. Gena Fray  END OF SESSION:  OT End of Session - 12/24/22 1121     Visit Number 7    Number of Visits 25    Date for OT Re-Evaluation 02/04/23    Authorization Type [employer]    Authorization - Visit Number 7    Progress Note Due on Visit 10    OT Start Time 1102    OT Stop Time 1142    OT Time Calculation (min) 40 min    Activity Tolerance Patient tolerated treatment well    Behavior During Therapy [facility] for tasks assessed/performed                Past Medical History:  Diagnosis Date   AAA (abdominal aortic aneurysm) ([place])    Cataract    forming    Guillain Barr syndrome [facility])    Past Surgical History:  Procedure Laterality Date   FOOT SURGERY Right    HERNIA REPAIR Right    IR FLUORO GUIDE CV LINE RIGHT  10/25/2022   IR US GUIDE VASC ACCESS RIGHT  10/25/2022   KNEE ARTHROSCOPY Left    WISDOM TOOTH EXTRACTION     Patient Active Problem List   Diagnosis Date Noted   Left-sided low back pain with left-sided sciatica 12/11/2022   CIDP (chronic inflammatory demyelinating polyneuropathy) ([place]) 12/11/2022   Constipation 11/15/2022   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[place]) 11/02/2022   Acute sensory neuropathy 10/25/2022   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[place]) 10/18/2022   B12 deficiency 10/18/2022   Gait abnormality 10/18/2022   Pre-diabetes 09/25/2022   AAA (abdominal aortic aneurysm) ([place]) 09/24/2022   Essential hypertension 09/24/2022   Iron deficiency 04/04/2022   Hypertriglyceridemia 04/03/2022   Coronary artery calcification 01/18/2022   Candidal intertrigo    Emphysema of lung ([place]) 07/26/2021   Aortic atherosclerosis ([place]) 07/26/2021   Internal hemorrhoids 01/23/2021   Diverticula of colon 01/23/2021   Eczema 01/23/2021   Actinic keratoses  01/23/2021   Tobacco use 09/02/2020    ONSET DATE: 11/07/23  REFERRING DIAG:  Diagnosis  G35 (ICD-10-CM) - Multiple sclerosis  G61.0 (ICD-10-CM) - Guillain-Barre syndrome  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 PMH sig for GBS, HTN, COPD, MS.  Patient is in today for follow-up from his recent hospitalization. Mr. Robtoy was admitted at [facility] from 11/13-11/20/2023. He presented with right leg weakness and ascending numbness of his fingers and toes. He had extensive work up. 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 He was treated with a course of IVIG. He underwent some initial physical therapy. Due to some logistical challenges, the decision was made to discharge him to home, rather than move to inpatient rehab.   Since returning home, Mr. Kucher notes he had fluctuating weakness in the legs. Therefore returned to MD and was hospitalized with new diagnosis of AIDP on 11/02/22, went to inpatient rehab and then returned home 11/10/22.   Other issues  were identified during his hospital stay, including some hyperglycemia with an A1c of 6.5%, a mildly high TSH with a normal T4, elevated blood pressures, and some dyspnea issues. His blood pressure was high enough that he was started on losartan 25 mg daily. He has been trying to quit smoking now in light of his current issues. Additionally, the hospital noted his infrarenal AAA.   PRECAUTIONS: Fall  WEIGHT BEARING RESTRICTIONS: No  PAIN:  Are you having pain?  NO   FALLS: Has patient fallen in last 6 months? No  LIVING ENVIRONMENT: Lives with: lives with their family and lives with their spouse Lives in: House/apartment Stairs: No Has following equipment at home: Ramped entry  PLOF: Independent  PATIENT GOALS: Get back to using hands normally  OBJECTIVE:   HAND DOMINANCE: Right  ADLs: Overall ADLs: increased time required Transfers/ambulation related to ADLs:uses walker  Eating: mod I Grooming: mod I UB Dressing: set up LB Dressing: set up Toileting: mod I with walker Bathing: walk in shower with grab bar has 3 in 1 but does not use, mod A  Tub Shower transfers: min A Equipment:  3 in 1  IADLs: Shopping: dependent Light housekeeping: dependent Meal Prep: does not Sport and exercise psychologist mobility: supervision Medication management: pt's wife assists Doctor, hospital: pt assists but difficulty writing Handwriting: 100% legible  MOBILITY STATUS:  uses walker  POSTURE COMMENTS:  No Significant postural limitations    FUNCTIONAL OUTCOME MEASURES: Upper Extremity Functional Scale (UEFS): 40% impairment  UPPER EXTREMITY ROM:  WFLS for bilateral UE's   UPPER EXTREMITY MMT:     MMT Right eval Left eval  Shoulder flexion 4+/5 4+/5  Elbow flexion 4+/5 4+/5  Elbow extension 4+/5 4+/5  (Blank rows = not tested)  HAND FUNCTION: Grip strength: Right: 73.1 lbs; Left: 83.3 lbs  12/12/22-9 hole peg test R 27.97, L 26.84 R 70.5 lbs , 72.3 lbs   COORDINATION: 9 Hole Peg test: Right: 36.41 sec; Left: 28.94 sec Box and Blocks:  Right 49blocks, Left 50blocks  SENSATION: Light touch: Impaired  for hands   EDEMA: n/a   COGNITION: Overall cognitive status: Within functional limits for tasks assessed however a little impulsive with testing  VISION: Subjective report: denies visual changes Baseline vision: Wears contacts  OBSERVATIONS: Pt appears anxious and moves quickly during testing.   TODAY'S TREATMENT:  12/24/22 Standing to remove graded clothespins from low surface with RUE, with LUE support to place on vertical antennae, process was reversed to use LUE with RUE support and minguard-close supervision for safety in prpe for home management Placing O' connor pegs into pegboard with RUE using tweezers, min-mod difficulty, then removing with in hand manipulation Arm bike x 8 mins level 4 for conditioning    PATIENT EDUCATION: Education details: safety for unloading dishwasher, while holding onto countertop, recommend top rack initially Person educated: Patient Education method: Explanation, Demonstration, and Verbal cues Education comprehension: verbalized understanding and returned demonstration       Access Code: [email or web address] URL: [email or web address] Date: 12/19/2022 Prepared by: Theone Murdoch  Exercises - Seated Shoulder Front Raise with Resistance  - 1 x daily - 7 x weekly - 3 sets - 15 reps - Seated Shoulder Row with Resistance Anchored at Feet  - 1 x daily - 7 x weekly - 1 sets - 15 reps - Seated Shoulder Extension with Resistance  - 1 x daily - 7 x weekly - 3 sets - 15 reps HOME EXERCISE PROGRAM: 12/12/22- green putty, coordination 12/17/22- red theraband ,  additions on 12/19/22 Access Code: [email or web address] URL: [email or web address] Date: 12/19/2022 Prepared by: Theone Murdoch  Exercises - Seated Shoulder Front Raise with Resistance  - 1 x daily - 7 x weekly - 3 sets - 15 reps - Seated Shoulder Row with Resistance Anchored at Feet  - 1 x daily - 7 x weekly - 1 sets - 15 reps - Seated Shoulder Extension with Resistance  - 1 x daily - 7 x weekly - 3 sets - 15 reps  GOALS: Goals reviewed with patient? Yes  SHORT TERM GOALS: Target date: 01/02/23  I with HEP  Goal status: INITIAL  2.  Pt will perform all basic ADLS modified independently  Goal status: ongoing, has performed but not consistent  3.  Pt will increase RUE grip strength by 5 lbs for increased  functional use. Baseline: RUE 73.1lbs, LUE 83.3 lbs Goal status: met, RUE 95, LUE 89.9  4.  I with adapted strategies, and AE to increase safety and I with ADLS/ IADLs (safety for showering)  Goal status: INITIAL  5.  Pt will perform light home management modified independently demonstrating good safety awareness.  Goal status: INITIAL   LONG TERM GOALS: Target date: 02/04/23  I with updated HEP for bilateral UE strength   Goal status: INITIAL  2.  Pt will improve UEFS score to 35% impairment Baseline: 40% impairment Goal status: INITIAL  3.  Pt will demonstrate improved RUE fine motor coordination as evidenced by decreasing 9 hole peg test score by 3 secs without drops Baseline: RUE 36.41, LUE 28.94  1/31: R 27.97, L 26.84 Goal status: INITIAL  ASSESSMENT:  CLINICAL IMPRESSION:  Pt is progressing towards goals. He demonstrates continued  PERFORMANCE DEFICITS: in functional skills including ADLs, IADLs, coordination, dexterity, sensation, strength, flexibility, Fine motor control, Gross motor control, mobility, balance, decreased knowledge of precautions, decreased knowledge of use of DME, and UE functional use, cognitive skills including safety awareness, and psychosocial skills including coping strategies, environmental adaptation, habits, interpersonal interactions, and routines and behaviors.   IMPAIRMENTS: are limiting patient from ADLs, IADLs, play, leisure, and social participation.   CO-MORBIDITIES: may have co-morbidities  that affects occupational performance. Patient will benefit from skilled OT to address above impairments and improve overall function.  REHAB POTENTIAL: Good  PLAN:  OT FREQUENCY: 3x/week plus eval (may drop down to 2x week depending on pt needs  OT DURATION: 8 weeks  PLANNED INTERVENTIONS: self care/ADL training, therapeutic exercise, therapeutic activity, neuromuscular re-education, manual therapy, passive range of motion, balance training,  functional mobility training, splinting, ultrasound, paraffin, fluidotherapy, moist heat, cryotherapy, contrast bath, patient/family education, cognitive remediation/compensation, energy conservation, coping strategies training, DME and/or AE instructions, and Re-evaluation  RECOMMENDED OTHER SERVICES: n/a  CONSULTED AND AGREED WITH PLAN OF CARE: Patient  PLAN FOR NEXT SESSION: anticipate dropping frequency to 2x week soon ADL strategies,- with focus on safety, pt moves quickly, fine motor coordination, grip and pinch strength tasks   Payton Moder, OT 12/24/2022, 11:23 AM

## 2022-12-24 NOTE — Therapy (Signed)
OUTPATIENT PHYSICAL THERAPY NEURO THERAPY    Patient Name: Joe Greene MRN: [id number] DOB:1951-02-01, 72 y.o., male Today's Date: 12/24/2022   PCP: Arlester Marker, MD REFERRING PROVIDER: Marlowe Shores, PA-C  END OF SESSION:  PT End of Session - 12/24/22 1016     Visit Number 15    Number of Visits 25    Date for PT Re-Evaluation 01/13/23    Authorization Type [employer] [place]    Progress Note Due on Visit 10    PT Start Time [id number]    PT Stop Time 1059    PT Time Calculation (min) 44 min    Equipment Utilized During Treatment Gait belt    Activity Tolerance Patient tolerated treatment well    Behavior During Therapy WFL for tasks assessed/performed                    Past Medical History:  Diagnosis Date   AAA (abdominal aortic aneurysm) ([place])    Cataract    forming    Guillain Barr syndrome ([place])    Past Surgical History:  Procedure Laterality Date   FOOT SURGERY Right    HERNIA REPAIR Right    IR FLUORO GUIDE CV LINE RIGHT  10/25/2022   IR US GUIDE VASC ACCESS RIGHT  10/25/2022   KNEE ARTHROSCOPY Left    WISDOM TOOTH EXTRACTION     Patient Active Problem List   Diagnosis Date Noted   Left-sided low back pain with left-sided sciatica 12/11/2022   CIDP (chronic inflammatory demyelinating polyneuropathy) ([place]) 12/11/2022   Constipation 11/15/2022   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[place]) 11/02/2022   Acute sensory neuropathy 10/25/2022   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[place]) 10/18/2022   B12 deficiency 10/18/2022   Gait abnormality 10/18/2022   Pre-diabetes 09/25/2022   AAA (abdominal aortic aneurysm) ([place]) 09/24/2022   Essential hypertension 09/24/2022   Iron deficiency 04/04/2022   Hypertriglyceridemia 04/03/2022   Coronary artery calcification 01/18/2022   Candidal intertrigo    Emphysema of lung ([place]) 07/26/2021   Aortic atherosclerosis ([place]) 07/26/2021   Internal hemorrhoids 01/23/2021   Diverticula of colon 01/23/2021   Eczema  01/23/2021   Actinic keratoses 01/23/2021   Tobacco use 09/02/2020    ONSET DATE: 11/06/2022 (referral date)  REFERRING DIAG: AIDP, GBS  THERAPY DIAG:  Muscle weakness (generalized)  Other abnormalities of gait and mobility  Unsteadiness on feet  Rationale for Evaluation and Treatment: Rehabilitation  SUBJECTIVE:  SUBJECTIVE STATEMENT: Pt ambulated into clinic w/SPC. Rating fatigue as 2/10. No falls or changes, L hip is still bothersome but "not bad".   Pt accompanied by: self and wife in lobby   PERTINENT HISTORY: Patient is in today for follow-up from his recent hospitalization. Joe Greene was admitted at [facility] from 11/13-11/20/2023. He presented with right leg weakness and ascending numbness of his fingers and toes. He had extensive work up. 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 He was treated with a course of IVIG. He underwent some initial physical therapy. Due to some logistical challenges, the decision was made to discharge him to home, rather than move to inpatient rehab.   Since returning home, Joe Greene notes he had fluctuating weakness in the legs. Therefore returned to MD and was hospitalized with new diagnosis of AIDP on 11/02/22, went to inpatient rehab and then returned home 11/10/22.   Other issues were identified during his hospital stay, including some hyperglycemia with an A1c of 6.5%, a mildly high TSH with a normal T4, elevated blood pressures, and some dyspnea issues. His blood pressure was high enough that he was started on losartan 25 mg daily. He has been trying to quit smoking now in light of his current issues. Additionally, the hospital noted his infrarenal AAA.  PAIN:  Are you having pain? Does not rate, but does report increased tingling in B  feet, some pain in BLEs  PRECAUTIONS: Fall  WEIGHT BEARING RESTRICTIONS: No  FALLS: Has patient fallen in last 6 months? No  LIVING ENVIRONMENT: Lives with: lives with their family Lives in: House/apartment Stairs: No Has following equipment at home: Environmental consultant - 2 wheeled, shower chair, and Grab bars  PLOF: Requires assistive device for independence, Needs assistance with ADLs, and Needs assistance with homemaking  PATIENT GOALS: "I want to get back to normal."   OBJECTIVE:   DIAGNOSTIC FINDINGS: Most recent nerve testing revealed AIDP  COGNITION: Overall cognitive status: Within functional limits for tasks assessed   SENSATION: Light touch: Impaired B numbness and tingling up to thighs and also in B hands  COORDINATION: WFL heel to shin     POSTURE: rounded shoulders and forward head Has to sit off of L hip at times due to pain    FUNCTIONAL TESTS: From eval  5 times sit to stand: 18.63 secs with B UE support  Timed up and go (TUG): 22.63 secs without AD with min A 10 meter walk test: 2.38 ft/sec with RW Berg Balance Scale: 33/56 (down from 42/56 when left CIR)    TODAY'S TREATMENT:               Ther Ex  SciFit ramp up level 7 for 8 minutes using BUE/BLEs for neural priming for reciprocal movement, dynamic cardiovascular warmup and BLE strength . RPE of 7/10 following activity   NMR Blaze pods for improved LE coordination and single leg stance: On blue beam w/6 pods placed in front of beam on line to work on side stepping:  Round 1: 8 hits while using SPC for balance, min A throughout for posterior LOB Round 2 : 10 hits w/o use of SPC. Min A for posterior LOB and steadying assist throughout.  Round 3-:12 hits, same as round 2 Standing in front of rebounder on rocker board in A/P direction using 2kg ball:  Standing ball throws, x12 reps, for anticipatory and reactive balance strategies. Pt had 1-2 incidents of anterior LOB and relied heavily on hip strategy  to  correct. Min A throughout for steadying assist  Added cog dual task (naming foods w/each throw) for added balance challenge. Min cues to slow down, as pt rapidly throwing ball without naming foods as he was unable to name foods as quickly as his throw. No changes in balance noted w/dual task  Slam balls w/10# ball, x10 reps, for improved BLE strength, high amplitude movement and endurance. CGA throughout Staggered stance sit <>stands for improved single leg stability and facilitation of ankle strategy:   5 reps per side w/blue dynadisc under single LE. Increased difficulty with disc under RLE Progressed to 6 reps w/dynadiscs under BLEs, Min A for anterior LOB x1  Added 10# slam ball chest throws w/4 stands, min A for posterior LOB w/throw.    Pt rated fatigue as 5/10 following session   PATIENT EDUCATION: Education details: Continue HEP  Person educated: Patient Education method: Explanation and Verbal cues Education comprehension: verbalized understanding and needs further education  HOME EXERCISE PROGRAM: Access Code: [email or web address] URL: [email or web address] Date: 11/21/2022 Prepared by: Cameron Sprang  Exercises - Standing March with Counter Support  - 1-2 x daily - 5 x weekly - 1-2 sets - 5-10 reps - 3 hold - Standing Thoracic and Cervical Rotation with Reach at Wall  - 1-2 x daily - 5 x weekly - 1-2 sets - 3-5 reps - 5 hold - Mini Squat with Counter Support  - 1-2 x daily - 5 x weekly - 1-2 sets - 5-10 reps - 3 hold - Standing Hip Abduction with Counter Support  - 1 x daily - 7 x weekly - 1-2 sets - 10 reps - Standing Hip Extension with Counter Support  - 1 x daily - 7 x weekly - 1-2 sets - 10 reps - Supine Figure 4 Piriformis Stretch  - 2-3 x daily - 7 x weekly - 1 sets - 2-3 reps - 30 secs hold - Seated Piriformis Stretch with Trunk Bend  - 1 x daily - 7 x weekly - 3-4 reps - 30-60 second hold - Seated Diaphragmatic Breathing  - 1 x daily - 7 x weekly - 3 sets - 10 reps  - 3-5 second hold  GOALS: Goals reviewed with patient? Yes  SHORT TERM GOALS: Target date: 12/15/22  Pt will be independent with initial HEP for improved strength, balance, transfers and gait. Baseline: Goal status: MET   2.  Pt will improve gait velocity to at least 2.75 ft/sec for improved gait efficiency and performance at S level  Baseline: 2.38 ft/sec with CGA and RW (1/3); 2.73 ft/s with RW and S*  Goal status: MET   3.  Pt will ambulate 500' w/LRAD and S* on 2MWT for improved endurance and efficiency with gait Baseline: 460' w/RW and CGA; 435' w/RW mod I Goal status: NOT MET    4. Pt will improve BERG balance score 4 points from baseline in order to indicate dec fall risk.   Baseline: 33/56; 36/56 on 2/2   Goal Status: PARTIALLY MET       LONG TERM GOALS: Target date: 01/13/2023   Pt will be independent with final HEP for improved strength, balance, transfers and gait. Baseline:  Goal status: INITIAL   2. Pt will improve gait velocity to at least 3.0 ft/sec with cane or no AD for improved gait efficiency and performance at Supervision level  Baseline: 2.38 ft/sec with RW and CGA Goal status: INITIAL   3.  Pt will improve 5 x STS to  less than or equal to 15 seconds without UE support to demonstrate improved functional strength and transfer efficiency.  Baseline: 18.63 with BUE support  Goal status: INITIAL   4.  Pt will improve normal TUG to less than or equal to 18 seconds without AD for improved functional mobility and decreased fall risk. Baseline: 15.97 sec (12/8) Goal status: INITIAL   5. Pt will ambulate 550' w/LRAD and mod I on 2MWT for improved endurance and efficiency with gait Baseline: 460' w/RW and CGA Goal status: REVISED  6. Pt will improve BERG balance score 8 points from baseline in order to indicate dec fall risk.   Baseline: 33/56  Goal Status: INITIAL  7.  Pt will negotiate up/down 4 steps with single rail/cane, up/down ramp and curb and ambulate  x 500' outdoors over unlevel paved surfaces at S level with cane or less in order to indicate improved community mobility.   Baseline:    Goal Status: INITIAL      ASSESSMENT:  CLINICAL IMPRESSION: Emphasis of skilled PT session on single leg stability, LE coordination and facilitation of ankle strategy. Pt tolerated session well and is using SPC for all mobility now. Pt continues to have difficulty w/sequencing of cane, occasionally tripping over cane w/R foot. Pt very challenged by dynamic balance tasks requiring ankle strategy to perform as he relies heavily on hip strategy and will throw himself off balance. Continue POC.    OBJECTIVE IMPAIRMENTS: Abnormal gait, cardiopulmonary status limiting activity, decreased activity tolerance, decreased balance, decreased endurance, decreased knowledge of use of DME, decreased mobility, difficulty walking, decreased strength, impaired perceived functional ability, impaired sensation, impaired UE functional use, improper body mechanics, and postural dysfunction.   ACTIVITY LIMITATIONS: carrying, lifting, bending, standing, squatting, stairs, transfers, bathing, reach over head, and locomotion level  PARTICIPATION LIMITATIONS: meal prep, cleaning, laundry, driving, shopping, community activity, and yard work  PERSONAL FACTORS: Past/current experiences and 3+ comorbidities: see above  are also affecting patient's functional outcome.   REHAB POTENTIAL: Excellent  CLINICAL DECISION MAKING: Evolving/moderate complexity  EVALUATION COMPLEXITY: Moderate  PLAN:  PT FREQUENCY: 3x/week  PT DURATION: 8 weeks  PLANNED INTERVENTIONS: Therapeutic exercises, Therapeutic activity, Neuromuscular re-education, Balance training, Gait training, Patient/Family education, Self Care, Stair training, Vestibular training, DME instructions, and Aquatic Therapy  PLAN FOR NEXT SESSION:  How is HEP? Did he get cane? dynamic standing balance with small ranges initially  with focus on control, compliant surfaces when able, gait with RW with obstacles and turns, activities without AD as able.  Ankle strategy, narrowed BOS/tandem. Staggered stance DL, KB swings, Bosu ball tasks, boxing   Charlett Nose, PT, [facility] [address] [place], [place]  [address] Phone:  [phone] Fax:  [phone] 12/24/22, 11:01 AM

## 2022-12-26 ENCOUNTER — Ambulatory Visit: Payer: Medicare PPO | Admitting: Occupational Therapy

## 2022-12-26 ENCOUNTER — Ambulatory Visit: Payer: Medicare PPO | Admitting: Rehabilitation

## 2022-12-26 ENCOUNTER — Encounter: Payer: Self-pay | Admitting: Occupational Therapy

## 2022-12-26 ENCOUNTER — Encounter: Payer: Self-pay | Admitting: Rehabilitation

## 2022-12-26 DIAGNOSIS — R2681 Unsteadiness on feet: Secondary | ICD-10-CM

## 2022-12-26 DIAGNOSIS — R208 Other disturbances of skin sensation: Secondary | ICD-10-CM

## 2022-12-26 DIAGNOSIS — G610 Guillain-Barre syndrome: Secondary | ICD-10-CM | POA: Diagnosis not present

## 2022-12-26 DIAGNOSIS — R278 Other lack of coordination: Secondary | ICD-10-CM

## 2022-12-26 DIAGNOSIS — M6281 Muscle weakness (generalized): Secondary | ICD-10-CM | POA: Diagnosis not present

## 2022-12-26 DIAGNOSIS — R2689 Other abnormalities of gait and mobility: Secondary | ICD-10-CM

## 2022-12-26 NOTE — Therapy (Signed)
OUTPATIENT OCCUPATIONAL THERAPY NEURO TREATMENT   Patient Name: Joe Greene MRN: [id number] DOB:07/22/1951, 72 y.o., male Today's Date: 12/26/2022  PCP: Dr. Gena Fray REFERRING PROVIDER: Dr. Gena Fray  END OF SESSION:  OT End of Session - 12/26/22 1019     Visit Number 8    Number of Visits 25    Date for OT Re-Evaluation 02/04/23    Authorization - Visit Number 8    Progress Note Due on Visit 10    OT Start Time 1018    OT Stop Time 1100    OT Time Calculation (min) 42 min                Past Medical History:  Diagnosis Date   AAA (abdominal aortic aneurysm) ([place])    Cataract    forming    Guillain Barr syndrome [facility])    Past Surgical History:  Procedure Laterality Date   FOOT SURGERY Right    HERNIA REPAIR Right    IR FLUORO GUIDE CV LINE RIGHT  10/25/2022   IR US GUIDE VASC ACCESS RIGHT  10/25/2022   KNEE ARTHROSCOPY Left    WISDOM TOOTH EXTRACTION     Patient Active Problem List   Diagnosis Date Noted   Left-sided low back pain with left-sided sciatica 12/11/2022   CIDP (chronic inflammatory demyelinating polyneuropathy) ([place]) 12/11/2022   Constipation 11/15/2022   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[place]) 11/02/2022   Acute sensory neuropathy 10/25/2022   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[place]) 10/18/2022   B12 deficiency 10/18/2022   Gait abnormality 10/18/2022   Pre-diabetes 09/25/2022   AAA (abdominal aortic aneurysm) ([place]) 09/24/2022   Essential hypertension 09/24/2022   Iron deficiency 04/04/2022   Hypertriglyceridemia 04/03/2022   Coronary artery calcification 01/18/2022   Candidal intertrigo    Emphysema of lung ([place]) 07/26/2021   Aortic atherosclerosis ([place]) 07/26/2021   Internal hemorrhoids 01/23/2021   Diverticula of colon 01/23/2021   Eczema 01/23/2021   Actinic keratoses 01/23/2021   Tobacco use 09/02/2020    ONSET DATE: 11/07/23  REFERRING DIAG:  Diagnosis  G35 (ICD-10-CM) - Multiple sclerosis  G61.0 (ICD-10-CM) -  Guillain-Barre syndrome  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 PMH sig for GBS, HTN, COPD, MS.  Patient is in today for follow-up from his recent hospitalization. Mr. Bayerl was admitted at [facility] from 11/13-11/20/2023. He presented with right leg weakness and ascending numbness of his fingers and toes. He had extensive work up. 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 He was treated with a course of IVIG. He underwent some initial physical therapy. Due to some logistical challenges, the decision was made to discharge him to home, rather than move to inpatient rehab.   Since returning home, Mr. Stenglein notes he had fluctuating weakness in the legs. Therefore returned to MD and was hospitalized with new diagnosis of AIDP on 11/02/22, went to inpatient rehab and then returned home 11/10/22.   Other issues were identified during his hospital stay, including some hyperglycemia with an A1c of  6.5%, a mildly high TSH with a normal T4, elevated blood pressures, and some dyspnea issues. His blood pressure was high enough that he was started on losartan 25 mg daily. He has been trying to quit smoking now in light of his current issues. Additionally, the hospital noted his infrarenal AAA.   PRECAUTIONS: Fall  WEIGHT BEARING RESTRICTIONS: No  PAIN:  Are you having pain? NO   FALLS: Has patient fallen in last 6 months? No  LIVING ENVIRONMENT: Lives with:  lives with their family and lives with their spouse Lives in: House/apartment Stairs: No Has following equipment at home: Ramped entry  PLOF: Independent  PATIENT GOALS: Get back to using hands normally  OBJECTIVE:   HAND DOMINANCE: Right  ADLs: Overall ADLs: increased time required Transfers/ambulation related to ADLs:uses walker  Eating: mod I Grooming: mod I UB Dressing: set up LB Dressing: set up Toileting: mod I with walker Bathing: walk in shower with grab bar has 3 in 1 but does not use, mod A  Tub Shower transfers: min A Equipment:  3 in 1  IADLs: Shopping: dependent Light housekeeping: dependent Meal Prep: does not Sport and exercise psychologist mobility: supervision Medication management: pt's wife assists Doctor, hospital: pt assists but difficulty writing Handwriting: 100% legible  MOBILITY STATUS:  uses walker  POSTURE COMMENTS:  No Significant postural limitations    FUNCTIONAL OUTCOME MEASURES: Upper Extremity Functional Scale (UEFS): 40% impairment  UPPER EXTREMITY ROM:  WFLS for bilateral UE's   UPPER EXTREMITY MMT:     MMT Right eval Left eval  Shoulder flexion 4+/5 4+/5  Elbow flexion 4+/5 4+/5  Elbow extension 4+/5 4+/5  (Blank rows = not tested)  HAND FUNCTION: Grip strength: Right: 73.1 lbs; Left: 83.3 lbs  12/12/22-9 hole peg test R 27.97, L 26.84 R 70.5 lbs , 72.3 lbs   COORDINATION: 9 Hole Peg test: Right: 36.41 sec; Left: 28.94 sec Box and Blocks:  Right 49blocks, Left 50blocks  SENSATION: Light touch: Impaired  for hands   EDEMA: n/a   COGNITION: Overall cognitive status: Within functional limits for tasks assessed however a little impulsive with testing  VISION: Subjective report: denies visual changes Baseline vision: Wears contacts  OBSERVATIONS: Pt appears anxious and moves quickly during testing.   TODAY'S TREATMENT: 12/24/22 Placing grooved pegs into pegboard with RUE using tweezers, min, then removing with in hand  manipulation Arm bike x 8 mins level 4 for conditioning Standing at countertop to retrieve items with RUE and place on low surface with close supervision and LUE support then retrieving from low surface with LUE (with RUE support) in prep for home management See education below   PATIENT EDUCATION: Education details: upgraded previous theraband HEP to green 15 reps each for shoulder flexion, abduction, rowing, shoulder extension, biceps curels, triceps extension 15 reps each min v.c for positioning Person educated: Patient Education method: Explanation, Demonstration, and Verbal cues Education comprehension: verbalized understanding, returned demonstration, and verbal cues required     HOME EXERCISE PROGRAM: 12/12/22- green putty, coordination 12/17/22- red theraband , additions on 12/19/22 Access Code: [email or web address] URL: [email or web address] Date: 12/19/2022 Prepared by: Theone Murdoch  Exercises - Seated Shoulder Front Raise with Resistance  - 1 x daily - 7 x weekly - 3 sets - 15 reps - Seated Shoulder Row with Resistance Anchored at Feet  - 1 x daily - 7 x weekly - 1 sets - 15 reps - Seated Shoulder Extension with Resistance  - 1 x daily - 7 x weekly - 3  sets - 15 reps  GOALS: Goals reviewed with patient? Yes  SHORT TERM GOALS: Target date: 01/02/23  I with HEP  Goal status: INITIAL  2.  Pt will perform all basic ADLS modified independently  Goal status: ongoing, has performed but not consistent 12/24/22  3.  Pt will increase RUE grip strength by 5 lbs for increased functional use. Baseline: RUE 73.1lbs, LUE 83.3 lbs Goal status: met, RUE 95, LUE 89.9  4.  I with adapted strategies, and AE to increase safety and I with ADLS/ IADLs (safety for showering)  Goal status:  ongoing, pt has a shower seat that he is using now( 12/26/22) Pt has been shown to hold onto counter if reaching lower for items in dishwasher, encouraged to unload top shelf first 5.  Pt will perform  light home management modified independently demonstrating good safety awareness.  Goal status: INITIAL   LONG TERM GOALS: Target date: 02/04/23  I with updated HEP for bilateral UE strength   Goal status: INITIAL  2.  Pt will improve UEFS score to 35% impairment Baseline: 40% impairment Goal status: INITIAL  3.  Pt will demonstrate improved RUE fine motor coordination as evidenced by decreasing 9 hole peg test score by 3 secs without drops Baseline: RUE 36.41, LUE 28.94  1/31: R 27.97, L 26.84 Goal status: INITIAL  ASSESSMENT:  CLINICAL IMPRESSION:  Pt is progressing towards goals. He demonstrates continued improving strength and coordination. He reports he is assisting more with ADLS PERFORMANCE DEFICITS: in functional skills including ADLs, IADLs, coordination, dexterity, sensation, strength, flexibility, Fine motor control, Gross motor control, mobility, balance, decreased knowledge of precautions, decreased knowledge of use of DME, and UE functional use, cognitive skills including safety awareness, and psychosocial skills including coping strategies, environmental adaptation, habits, interpersonal interactions, and routines and behaviors.   IMPAIRMENTS: are limiting patient from ADLs, IADLs, play, leisure, and social participation.   CO-MORBIDITIES: may have co-morbidities  that affects occupational performance. Patient will benefit from skilled OT to address above impairments and improve overall function.  REHAB POTENTIAL: Good  PLAN:  OT FREQUENCY: 3x/week plus eval (may drop down to 2x week depending on pt needs  OT DURATION: 8 weeks  PLANNED INTERVENTIONS: self care/ADL training, therapeutic exercise, therapeutic activity, neuromuscular re-education, manual therapy, passive range of motion, balance training, functional mobility training, splinting, ultrasound, paraffin, fluidotherapy, moist heat, cryotherapy, contrast bath, patient/family education, cognitive  remediation/compensation, energy conservation, coping strategies training, DME and/or AE instructions, and Re-evaluation  RECOMMENDED OTHER SERVICES: n/a  CONSULTED AND AGREED WITH PLAN OF CARE: Patient  PLAN FOR NEXT SESSION: anticipate dropping frequency to 2x week soon ADL strategies,continue with fine motor coordination, grip and pinch strength tasks   Khiry Pasquariello, OT 12/26/2022, 10:20 AM

## 2022-12-26 NOTE — Therapy (Signed)
OUTPATIENT PHYSICAL THERAPY NEURO THERAPY    Patient Name: Joe Greene MRN: [id number] DOB:22-Aug-1951, 72 y.o., male Today's Date: 12/26/2022   PCP: Joe Marker, MD REFERRING PROVIDER: Marlowe Shores, PA-C  END OF SESSION:  PT End of Session - 12/26/22 0939     Visit Number 16    Number of Visits 25    Date for PT Re-Evaluation 01/13/23    Authorization Type [employer] [place]    Progress Note Due on Visit 10    PT Start Time 0848    PT Stop Time 0930    PT Time Calculation (min) 42 min    Equipment Utilized During Treatment Gait belt    Activity Tolerance Patient tolerated treatment well    Behavior During Therapy WFL for tasks assessed/performed                    Past Medical History:  Diagnosis Date   AAA (abdominal aortic aneurysm) ([place])    Cataract    forming    Guillain Barr syndrome ([place])    Past Surgical History:  Procedure Laterality Date   FOOT SURGERY Right    HERNIA REPAIR Right    IR FLUORO GUIDE CV LINE RIGHT  10/25/2022   IR US GUIDE VASC ACCESS RIGHT  10/25/2022   KNEE ARTHROSCOPY Left    WISDOM TOOTH EXTRACTION     Patient Active Problem List   Diagnosis Date Noted   Left-sided low back pain with left-sided sciatica 12/11/2022   CIDP (chronic inflammatory demyelinating polyneuropathy) ([place]) 12/11/2022   Constipation 11/15/2022   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[place]) 11/02/2022   Acute sensory neuropathy 10/25/2022   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[place]) 10/18/2022   B12 deficiency 10/18/2022   Gait abnormality 10/18/2022   Pre-diabetes 09/25/2022   AAA (abdominal aortic aneurysm) ([place]) 09/24/2022   Essential hypertension 09/24/2022   Iron deficiency 04/04/2022   Hypertriglyceridemia 04/03/2022   Coronary artery calcification 01/18/2022   Candidal intertrigo    Emphysema of lung ([place]) 07/26/2021   Aortic atherosclerosis ([place]) 07/26/2021   Internal hemorrhoids 01/23/2021   Diverticula of colon 01/23/2021   Eczema  01/23/2021   Actinic keratoses 01/23/2021   Tobacco use 09/02/2020    ONSET DATE: 11/06/2022 (referral date)  REFERRING DIAG: AIDP, GBS  THERAPY DIAG:  Muscle weakness (generalized)  Unsteadiness on feet  Other disturbances of skin sensation  Other abnormalities of gait and mobility  Rationale for Evaluation and Treatment: Rehabilitation  SUBJECTIVE:  SUBJECTIVE STATEMENT: Pt still ambulating with cane but feels like he has taken a step backwards today, slightly more numbness in LLE.  Has IVIG round next week, follow up with Joe Greene next week.   Pt accompanied by: self and wife in lobby   PERTINENT HISTORY: Patient is in today for follow-up from his recent hospitalization. Joe Greene was admitted at [facility] from 11/13-11/20/2023. He presented with right leg weakness and ascending numbness of his fingers and toes. He had extensive work up. 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 He was treated with a course of IVIG. He underwent some initial physical therapy. Due to some logistical challenges, the decision was made to discharge him to home, rather than move to inpatient rehab.   Since returning home, Joe Greene notes he had fluctuating weakness in the legs. Therefore returned to MD and was hospitalized with new diagnosis of AIDP on 11/02/22, went to inpatient rehab and then returned home 11/10/22.   Other issues were identified during his hospital stay, including some hyperglycemia with an A1c of 6.5%, a mildly high TSH with a normal T4, elevated blood pressures, and some dyspnea issues. His blood pressure was high enough that he was started on losartan 25 mg daily. He has been trying to quit smoking now in light of his current issues. Additionally, the hospital noted his infrarenal  AAA.  PAIN:  Are you having pain? Does not rate, but does report increased tingling in B feet, some pain in BLEs  PRECAUTIONS: Fall  WEIGHT BEARING RESTRICTIONS: No  FALLS: Has patient fallen in last 6 months? No  LIVING ENVIRONMENT: Lives with: lives with their family Lives in: House/apartment Stairs: No Has following equipment at home: Environmental consultant - 2 wheeled, shower chair, and Grab bars  PLOF: Requires assistive device for independence, Needs assistance with ADLs, and Needs assistance with homemaking  PATIENT GOALS: "I want to get back to normal."   OBJECTIVE:   DIAGNOSTIC FINDINGS: Most recent nerve testing revealed AIDP  COGNITION: Overall cognitive status: Within functional limits for tasks assessed   SENSATION: Light touch: Impaired B numbness and tingling up to thighs and also in B hands  COORDINATION: WFL heel to shin     POSTURE: rounded shoulders and forward head Has to sit off of L hip at times due to pain    FUNCTIONAL TESTS: From eval  5 times sit to stand: 18.63 secs with B UE support  Timed up and go (TUG): 22.63 secs without AD with min A 10 meter walk test: 2.38 ft/sec with RW Berg Balance Scale: 33/56 (down from 42/56 when left CIR)    TODAY'S TREATMENT:               Ther Ex  SciFit ramp up level 5 for 6 minutes using BUE/BLEs for neural priming for reciprocal movement, dynamic cardiovascular warmup and BLE strength . RPE of 4/10 following activity. Sit<>stand from mat without UE support with cues for more forward trunk lean when both sitting and standing for improved control (x 5 reps).  Then performed from 14" step x 5 reps without UE support and great control.    NMR Attempted to have pt stand on purple beam and tap cones, however even just standing on beam today was difficult so moved beam to bars for light support as needed and performed side stepping x 4 laps, tandem gait x 4 laps, all with intermittent UE support and min/guard A from PT.   Moved out of  bars to stand on Greene foam mat, alt cone taps x 7 reps (2 sets).  Transitioned to more coordination tasks; jumping jacks (LEs only) with light support and cues for push off from toes and landing on toes with slight knee bend x 10 reps, split jumps x 10 reps again with same cues as above.  Split jumps on 4" step with light support throughout and cues for more LE activation.  Standing tapping toe only to point ant/lat/post then returning to midline x 4 sets (on each side) with light to no UE support.  Moved to stairs standing on one leg, tapping to 1st, 2nd, 3rd step before returning to floor (performed 3 sets on each side).    Ended session with gait around clinic x 500' with varied balance challenges:  backwards walking, quick turns, head turns up/down and side/side, sudden stops.  Has most difficulty with backwards gait, needing min/guard to min A at times and note more LLE ER with task, causing him hit L foot against R foot.    Fatigue at end of session 4/10.   PATIENT EDUCATION: Education details: Continue HEP  Person educated: Patient Education method: Merchandiser, retail cues Education comprehension: verbalized understanding and needs further education  HOME EXERCISE PROGRAM: Access Code: [email or web address] URL: [email or web address] Date: 11/21/2022 Prepared by: Cameron Sprang  Exercises - Standing March with Counter Support  - 1-2 x daily - 5 x weekly - 1-2 sets - 5-10 reps - 3 hold - Standing Thoracic and Cervical Rotation with Reach at Wall  - 1-2 x daily - 5 x weekly - 1-2 sets - 3-5 reps - 5 hold - Mini Squat with Counter Support  - 1-2 x daily - 5 x weekly - 1-2 sets - 5-10 reps - 3 hold - Standing Hip Abduction with Counter Support  - 1 x daily - 7 x weekly - 1-2 sets - 10 reps - Standing Hip Extension with Counter Support  - 1 x daily - 7 x weekly - 1-2 sets - 10 reps - Supine Figure 4 Piriformis Stretch  - 2-3 x daily - 7 x weekly - 1 sets - 2-3 reps - 30  secs hold - Seated Piriformis Stretch with Trunk Bend  - 1 x daily - 7 x weekly - 3-4 reps - 30-60 second hold - Seated Diaphragmatic Breathing  - 1 x daily - 7 x weekly - 3 sets - 10 reps - 3-5 second hold  GOALS: Goals reviewed with patient? Yes  SHORT TERM GOALS: Target date: 12/15/22  Pt will be independent with initial HEP for improved strength, balance, transfers and gait. Baseline: Goal status: MET   2.  Pt will improve gait velocity to at least 2.75 ft/sec for improved gait efficiency and performance at S level  Baseline: 2.38 ft/sec with CGA and RW (1/3); 2.73 ft/s with RW and S*  Goal status: MET   3.  Pt will ambulate 500' w/LRAD and S* on 2MWT for improved endurance and efficiency with gait Baseline: 460' w/RW and CGA; 435' w/RW mod I Goal status: NOT MET    4. Pt will improve BERG balance score 4 points from baseline in order to indicate dec fall risk.   Baseline: 33/56; 36/56 on 2/2   Goal Status: PARTIALLY MET       LONG TERM GOALS: Target date: 01/13/2023   Pt will be independent with final HEP for improved strength, balance, transfers and gait. Baseline:  Goal status: INITIAL   2. Pt  will improve gait velocity to at least 3.0 ft/sec with cane or no AD for improved gait efficiency and performance at Supervision level  Baseline: 2.38 ft/sec with RW and CGA Goal status: INITIAL   3.  Pt will improve 5 x STS to less than or equal to 15 seconds without UE support to demonstrate improved functional strength and transfer efficiency.  Baseline: 18.63 with BUE support  Goal status: INITIAL   4.  Pt will improve normal TUG to less than or equal to 18 seconds without AD for improved functional mobility and decreased fall risk. Baseline: 15.97 sec (12/8) Goal status: INITIAL   5. Pt will ambulate 550' w/LRAD and mod I on 2MWT for improved endurance and efficiency with gait Baseline: 460' w/RW and CGA Goal status: REVISED  6. Pt will improve BERG balance score 8 points  from baseline in order to indicate dec fall risk.   Baseline: 33/56  Goal Status: INITIAL  7.  Pt will negotiate up/down 4 steps with single rail/cane, up/down ramp and curb and ambulate x 500' outdoors over unlevel paved surfaces at S level with cane or less in order to indicate improved community mobility.   Baseline:    Goal Status: INITIAL      ASSESSMENT:  CLINICAL IMPRESSION: Skilled session continues to focus on high level balance with ankle, hip and stepping strategy along with coordination tasks.  Pt slightly more fatigued today and unable to do as high level of tasks as he did in last session.  Reports he feels "its coming back." Reports increased numbness in LLE up to thigh when it had improved to only feet/toes.  Begins 4th infusion on Monday.     OBJECTIVE IMPAIRMENTS: Abnormal gait, cardiopulmonary status limiting activity, decreased activity tolerance, decreased balance, decreased endurance, decreased knowledge of use of DME, decreased mobility, difficulty walking, decreased strength, impaired perceived functional ability, impaired sensation, impaired UE functional use, improper body mechanics, and postural dysfunction.   ACTIVITY LIMITATIONS: carrying, lifting, bending, standing, squatting, stairs, transfers, bathing, reach over head, and locomotion level  PARTICIPATION LIMITATIONS: meal prep, cleaning, laundry, driving, shopping, community activity, and yard work  PERSONAL FACTORS: Past/current experiences and 3+ comorbidities: see above  are also affecting patient's functional outcome.   REHAB POTENTIAL: Excellent  CLINICAL DECISION MAKING: Evolving/moderate complexity  EVALUATION COMPLEXITY: Moderate  PLAN:  PT FREQUENCY: 3x/week  PT DURATION: 8 weeks  PLANNED INTERVENTIONS: Therapeutic exercises, Therapeutic activity, Neuromuscular re-education, Balance training, Gait training, Patient/Family education, Self Care, Stair training, Vestibular training, DME  instructions, and Aquatic Therapy  PLAN FOR NEXT SESSION:  How is HEP? dynamic standing balance with small ranges initially with focus on control, compliant surfaces, gait with and without cane with obstacles and turns, activities without AD as able.  Ankle strategy, narrowed BOS/tandem. Staggered stance DL, KB swings, Bosu ball tasks, boxing   Cameron Sprang, PT, MPT [facility] [address] [place], [place], [address] Phone: [phone]   Fax:  [phone] 12/26/22, 10:32 AM

## 2022-12-28 ENCOUNTER — Encounter: Payer: Self-pay | Admitting: Occupational Therapy

## 2022-12-28 ENCOUNTER — Ambulatory Visit: Payer: Medicare PPO | Admitting: Physical Therapy

## 2022-12-28 ENCOUNTER — Encounter: Payer: Self-pay | Admitting: Neurology

## 2022-12-28 ENCOUNTER — Ambulatory Visit: Payer: Medicare PPO | Admitting: Occupational Therapy

## 2022-12-28 DIAGNOSIS — G610 Guillain-Barre syndrome: Secondary | ICD-10-CM

## 2022-12-28 DIAGNOSIS — R278 Other lack of coordination: Secondary | ICD-10-CM

## 2022-12-28 DIAGNOSIS — R2681 Unsteadiness on feet: Secondary | ICD-10-CM | POA: Diagnosis not present

## 2022-12-28 DIAGNOSIS — R208 Other disturbances of skin sensation: Secondary | ICD-10-CM | POA: Diagnosis not present

## 2022-12-28 DIAGNOSIS — R2689 Other abnormalities of gait and mobility: Secondary | ICD-10-CM | POA: Diagnosis not present

## 2022-12-28 DIAGNOSIS — M6281 Muscle weakness (generalized): Secondary | ICD-10-CM

## 2022-12-28 NOTE — Therapy (Unsigned)
OUTPATIENT OCCUPATIONAL THERAPY NEURO TREATMENT   Patient Name: Joe Greene MRN: [id number] DOB:April 27, 1951, 72 y.o., male Today's Date: 12/28/2022  PCP: Dr. Gena Fray REFERRING PROVIDER: Dr. Gena Fray  END OF SESSION:  OT End of Session - 12/28/22 1147     Visit Number 9    Number of Visits 25    Date for OT Re-Evaluation 02/04/23    Authorization - Visit Number 9    Progress Note Due on Visit 10    OT Start Time 1147    OT Stop Time 1225    OT Time Calculation (min) 38 min    Activity Tolerance Patient tolerated treatment well    Behavior During Therapy [facility] for tasks assessed/performed                 Past Medical History:  Diagnosis Date   AAA (abdominal aortic aneurysm) ([place])    Cataract    forming    Guillain Barr syndrome [facility])    Past Surgical History:  Procedure Laterality Date   FOOT SURGERY Right    HERNIA REPAIR Right    IR FLUORO GUIDE CV LINE RIGHT  10/25/2022   IR US GUIDE VASC ACCESS RIGHT  10/25/2022   KNEE ARTHROSCOPY Left    WISDOM TOOTH EXTRACTION     Patient Active Problem List   Diagnosis Date Noted   Left-sided low back pain with left-sided sciatica 12/11/2022   CIDP (chronic inflammatory demyelinating polyneuropathy) ([place]) 12/11/2022   Constipation 11/15/2022   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[place]) 11/02/2022   Acute sensory neuropathy 10/25/2022   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[place]) 10/18/2022   B12 deficiency 10/18/2022   Gait abnormality 10/18/2022   Pre-diabetes 09/25/2022   AAA (abdominal aortic aneurysm) ([place]) 09/24/2022   Essential hypertension 09/24/2022   Iron deficiency 04/04/2022   Hypertriglyceridemia 04/03/2022   Coronary artery calcification 01/18/2022   Candidal intertrigo    Emphysema of lung ([place]) 07/26/2021   Aortic atherosclerosis ([place]) 07/26/2021   Internal hemorrhoids 01/23/2021   Diverticula of colon 01/23/2021   Eczema 01/23/2021   Actinic keratoses 01/23/2021   Tobacco use 09/02/2020     ONSET DATE: 11/07/23  REFERRING DIAG:  Diagnosis  G35 (ICD-10-CM) - Multiple sclerosis  G61.0 (ICD-10-CM) - Guillain-Barre syndrome  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 He feels like he was completely independent less than a weak ago and then started going backward with his progress.   Joe Greene accompanied by: self  PERTINENT HISTORY: Joe Greene is a 72 yo M who presents with 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 PMH sig for GBS, HTN, COPD, MS.  Patient is in today for follow-up from his recent hospitalization. Joe Greene was admitted at [facility] from 11/13-11/20/2023. He presented with right leg weakness and ascending numbness of his fingers and toes. He had extensive work up. 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 He was treated with a course of IVIG. He underwent some initial physical therapy. Due to some logistical challenges, the decision was made to discharge him to home, rather than move to inpatient rehab.   Since returning home, Mr.  Greene notes he had fluctuating weakness in the legs. Therefore returned to MD and was hospitalized with new diagnosis of AIDP on 11/02/22, went to inpatient rehab and then returned home 11/10/22.   Other issues were identified during his hospital stay, including some hyperglycemia with an A1c of 6.5%, a mildly high TSH with a normal T4, elevated blood pressures, and some dyspnea issues. His blood pressure was high enough that he was  started on losartan 25 mg daily. He has been trying to quit smoking now in light of his current issues. Additionally, the hospital noted his infrarenal AAA.   PRECAUTIONS: Fall  WEIGHT BEARING RESTRICTIONS: No  PAIN:  Are you having pain? NO   FALLS: Has patient fallen in last 6 months? No  LIVING ENVIRONMENT: Lives with: lives with their family and lives with their spouse Lives in: House/apartment Stairs: No Has following equipment at home: Ramped entry  PLOF: Independent  PATIENT GOALS: Get back to using hands normally  OBJECTIVE:   HAND DOMINANCE: Right  ADLs: Overall ADLs: increased time required Transfers/ambulation related to ADLs:uses walker  Eating: mod I Grooming: mod I UB Dressing: set up LB Dressing: set up Toileting: mod I with walker Bathing: walk in shower with grab bar has 3 in 1 but does not use, mod A  Tub Shower transfers: min A Equipment:  3 in 1  IADLs: Shopping: dependent Light housekeeping: dependent Meal Prep: does not Sport and exercise psychologist mobility: supervision Medication management: Joe Greene's wife assists Doctor, hospital: Joe Greene assists but difficulty writing Handwriting: 100% legible  MOBILITY STATUS:  uses walker  POSTURE COMMENTS:  No Significant postural limitations    FUNCTIONAL OUTCOME MEASURES: Upper Extremity Functional Scale (UEFS): 40% impairment  UPPER EXTREMITY ROM:  WFLS for bilateral UE's   UPPER EXTREMITY MMT:     MMT Right eval Left eval  Shoulder flexion 4+/5 4+/5  Elbow flexion 4+/5 4+/5  Elbow extension 4+/5 4+/5  (Blank rows = not tested)  HAND FUNCTION: Grip strength: Right: 73.1 lbs; Left: 83.3 lbs  12/12/22-9 hole peg test R 27.97, L 26.84 R 70.5 lbs , 72.3 lbs   COORDINATION: 9 Hole Peg test: Right: 36.41 sec; Left: 28.94 sec Box and Blocks:  Right 49blocks, Left 50blocks  SENSATION: Light touch: Impaired  for hands   EDEMA: n/a   COGNITION: Overall cognitive status: Within functional limits  for tasks assessed however a little impulsive with testing  VISION: Subjective report: denies visual changes Baseline vision: Wears contacts  OBSERVATIONS: Joe Greene appears anxious and moves quickly during testing.   TODAY'S TREATMENT: 12/24/22 ***   PATIENT EDUCATION: Education details: upgraded previous theraband HEP to green 15 reps each for shoulder flexion, abduction, rowing, shoulder extension, biceps curels, triceps extension 15 reps each min v.c for positioning Person educated: Patient Education method: Explanation, Demonstration, and Verbal cues Education comprehension: verbalized understanding, returned demonstration, and verbal cues required     HOME EXERCISE PROGRAM: 12/12/22- green putty, coordination 12/17/22- red theraband , additions on 12/19/22 Access Code: [email or web address] URL: [email or web address] Date: 12/19/2022 Prepared by: Theone Murdoch  Exercises - Seated Shoulder Front Raise with Resistance  - 1 x daily - 7 x weekly - 3 sets - 15 reps - Seated Shoulder Row with Resistance Anchored at Feet  - 1 x daily - 7 x weekly - 1 sets - 15 reps - Seated Shoulder Extension with Resistance  - 1 x daily - 7 x weekly - 3 sets - 15 reps  GOALS: Goals reviewed with patient? Yes  SHORT TERM GOALS: Target date: 01/02/23  I with HEP  Goal status: INITIAL  2.  Joe Greene will perform all basic ADLS modified independently  Goal status: ongoing, has performed but not consistent 12/24/22  3.  Joe Greene will increase RUE grip strength by 5 lbs for increased functional use. Baseline: RUE 73.1lbs, LUE 83.3 lbs Goal status: MET, RUE 95, LUE 89.9  4.  I with adapted strategies, and AE to increase safety and I with ADLS/ IADLs (safety for showering)  Goal status:  ongoing, Joe Greene has a shower seat that he is using now( 12/26/22) Joe Greene has been shown to hold onto counter if reaching lower for items in dishwasher, encouraged to unload top shelf first  5.  Joe Greene will perform light home management  modified independently demonstrating good safety awareness.  Goal status: INITIAL   LONG TERM GOALS: Target date: 02/04/23  I with updated HEP for bilateral UE strength   Goal status: INITIAL  2.  Joe Greene will improve UEFS score to 35% impairment Baseline: 40% impairment  12/28/2022: 20% impairment Goal status: MET  3.  Joe Greene will demonstrate improved RUE fine motor coordination as evidenced by decreasing 9 hole peg test score by 3 secs without drops Baseline: RUE 36.41, LUE 28.94  1/31: R 27.97, L 26.84 Goal status: INITIAL  ASSESSMENT:  CLINICAL IMPRESSION:  Joe Greene is progressing towards goals. He demonstrates continued improving strength and coordination. He reports he is assisting more with ADLS PERFORMANCE DEFICITS: in functional skills including ADLs, IADLs, coordination, dexterity, sensation, strength, flexibility, Fine motor control, Gross motor control, mobility, balance, decreased knowledge of precautions, decreased knowledge of use of DME, and UE functional use, cognitive skills including safety awareness, and psychosocial skills including coping strategies, environmental adaptation, habits, interpersonal interactions, and routines and behaviors.   IMPAIRMENTS: are limiting patient from ADLs, IADLs, play, leisure, and social participation.   CO-MORBIDITIES: may have co-morbidities  that affects occupational performance. Patient will benefit from skilled OT to address above impairments and improve overall function.  REHAB POTENTIAL: Good  PLAN:  OT FREQUENCY: 3x/week plus eval (may drop down to 2x week depending on Joe Greene needs  OT DURATION: 8 weeks  PLANNED INTERVENTIONS: self care/ADL training, therapeutic exercise, therapeutic activity, neuromuscular re-education, manual therapy, passive range of motion, balance training, functional mobility training, splinting, ultrasound, paraffin, fluidotherapy, moist heat, cryotherapy, contrast bath, patient/family education, cognitive  remediation/compensation, energy conservation, coping strategies training, DME and/or AE instructions, and Re-evaluation  RECOMMENDED OTHER SERVICES: n/a  CONSULTED AND AGREED WITH PLAN OF CARE: Patient  PLAN FOR NEXT SESSION: anticipate dropping frequency to 2x week soon ADL strategies,continue with fine motor coordination, grip and pinch strength tasks   Dennis Bast, OT 12/28/2022, 2:13 PM

## 2022-12-28 NOTE — Patient Instructions (Signed)
1    Energy Conservation  What is Patent attorney?  After being in the hospital, it is normal to feel tired and weak. You may also feel short of breath and have less energy to do the activities you are used to doing at home. Learning how to conserve your energy helps you build up your strength to take part in your daily activities and other things you enjoy doing.  When you learn to conserve energy, you also reduce strain on your heart, fatigue, shortness of breath and stress related pain.  Learning to conserve your energy is all about finding a good balance between work, rest and leisure in order to decrease the amount of energy demand on your body.   Remember and Practice the 4 Ps  1. Prioritize  Decide what needs to be done today and what can be done at a later date or time. For example, going to a doctor's appointment would take priority over dusting the living room.  When you have more than one thing to do, begin with the most important to make sure it gets done.   2. Plan  Plan your activities first to avoid extra trips. Gather the supplies and  equipment you need before doing the job. For example, get your  garden supplies and tools ready before you start to plant flowers.  Plan to alternate heavy and light tasks.  Plan activities throughout the week to avoid doing too many activities in one day. Put a schedule on the refrigerator to remind you and others who is doing what.  Plan to get a good rest each night.  Use family and friends or pay for help to complete tasks you may struggle with or that require too much energy.   3. Pace  Maintain a slow and steady pace. Never rush.  2   3. Pace (continued)  Rest often. Rest before you feel tired.  Avoid holding your breath. Practice breathing slow and steady.  Use pursed lip breathing. Breathe in through your nose for a count of 2 and out from your mouth for a count of 4. This is like blowing out a candle on a cake.  Remember  that you may have to ask for help to do some tasks and that is okay.  Listen to your body and know your limits.   4. Position  Too much bending and reaching can cause fatigue and shortness of breath. Use a reacher, sock aid, long handled shoe horn and/or  elastic shoelaces. Avoid bending and reaching too much.  Always maintain a nice upright posture when sitting and  standing. This helps you get more oxygen into your lungs and  around your body to work better.  Sit when you can. Sitting supports your body so you can focus  on your breathing and activities while conserving your energy.  Sitting reduces energy use by 25%.   Energy Conservation Tips  Dressing and Hygiene  Sit when you can.  Organize and lay out clothing the night before.  Begin dressing your lower half first as this uses more energy.  Avoid bending and reaching. Instead, use a reacher, sock aid  or long handled shoe horn or lift your legs up onto the bed or chair.  Dry off with terry cloth robe. You use less energy than drying off with a towel.  If you have a weaker limb or limbs, it is easier to dress the weaker limb first. It is easier to undress your  strong limb first.  Wear clothes that are easy to put on and take off. For example, use clothes and shoes with velcro instead of small buttons, clasps or laces.  3    Avoid using scented products such as hair products and lotions. These can irritate your lungs and cause shortness of breath for you and others around you. Many people are allergic to scents. These types of products are not allowed in the hospital.  Be cautious when bathing. Use warm, not hot water. This helps eliminate shortness of breath from a buildup of steam and condensation.  Use the bathroom equipment suggested by your Occupational Therapist. For example using a bath bench, bath stool, grab bars or a raised toilet seat can make bathing and toileting easier and safer.   Shopping  Bring a prepared list  of things you need to buy.  Organize your shopping list by aisle or section of the  store.  Transport items in a buggy or shopping cart rather than  carrying them in a basket.  Load and carry grocery bags that are only half full or shop with someone who can help pack and carry bags.  Avoid going out during rush hour when stores and streets are crowded.  Consider using a delivery service.   Housework  Divide activities and do them throughout the week. Balance light with heavy tasks.  Make one side of the bed at a time. Sit to change pillow cases and unfold linen.  Avoid spray cleaners that may irritate your lungs.  Clean the bathtub by sitting or kneeling.  Clean one whole room at a time instead of going back and forth between rooms to do each job.  Consider asking for help from family members or  hiring a cleaning service or housekeeper.  After washing dishes, allow them to air dry.  Have work in front of you rather than at your side.  Slide rather than lift objects.  Use long handled dustpans and cleaning sponges to decrease the need for bending.  4    Make a weekly plan for major jobs such as laundry, cleaning and  changing sheets on beds. Do one job each day.  Keep a trash can in every room to avoid too much walking.  Buy more than one of each item you use around the house.  For example, keep sink cleaner in the bathroom and kitchen.  Keep a vacuum on each level of your home.   Cooking  Cook and bake in steps to reduce energy use.  Gather all ingredients and utensils before starting.  Plan ahead with meal preparation.  Make large meals and freeze in servings for later use.  Use lightweight cookware and dishes to conserve energy.  Use paper plates and cups to eliminate dishwashing.  Use electric appliances such as can openers, blenders, food processors and dishwasher to conserve energy.  Consider buying easy to prepare or frozen meals, or using a meal delivery service.    Key Points:  Prioritize activities of the day. Do heavier tasks when you have more energy.  Plan your days' and weeks' activities. Set up your work area so you do not have to move around a lot looking for items to complete the task. Plan rest times.  Pace yourself. Do not try to complete the whole task in one session. Break it into smaller, easy to do steps. A good guide to follow is to take 10 minutes each hour to rest. Do not rush.  Position and Posture are important. Sit to work when you can to use 25% less energy. Sit and stand as upright as you can. Practice deep breathing exercises while you work to maintain your breathing rate and stay relaxed.  Use assistive devices when recommended to save energy and make it more comfortable and easy taking care of yourself.   Remember.  The most important energy conservation tip is to listen to your body.  Stop and rest BEFORE you get tired. Plan rest times. Rest often.   PD 8278 (Rev 09-2012) File: peyles

## 2022-12-28 NOTE — Therapy (Signed)
OUTPATIENT PHYSICAL THERAPY NEURO THERAPY    Patient Name: Murtaza Dios MRN: [id number] DOB:[birth year], 72 y.o., male Today's Date: 12/28/2022   PCP: Arlester Marker, MD REFERRING PROVIDER: Marlowe Shores, PA-C  END OF SESSION:  PT End of Session - 12/28/22 1104     Visit Number 17    Number of Visits 25    Date for PT Re-Evaluation 01/13/23    Authorization Type [employer] [place]    Progress Note Due on Visit 10    PT Start Time [id number]    PT Stop Time [id number]    PT Time Calculation (min) 43 min    Equipment Utilized During Treatment Gait belt    Activity Tolerance Patient tolerated treatment well    Behavior During Therapy WFL for tasks assessed/performed                     Past Medical History:  Diagnosis Date   AAA (abdominal aortic aneurysm) ([place])    Cataract    forming    Guillain Barr syndrome ([place])    Past Surgical History:  Procedure Laterality Date   FOOT SURGERY Right    HERNIA REPAIR Right    IR FLUORO GUIDE CV LINE RIGHT  10/25/2022   IR US GUIDE VASC ACCESS RIGHT  10/25/2022   KNEE ARTHROSCOPY Left    WISDOM TOOTH EXTRACTION     Patient Active Problem List   Diagnosis Date Noted   Left-sided low back pain with left-sided sciatica 12/11/2022   CIDP (chronic inflammatory demyelinating polyneuropathy) ([place]) 12/11/2022   Constipation 11/15/2022   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[place]) 11/02/2022   Acute sensory neuropathy 10/25/2022   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[place]) 10/18/2022   B12 deficiency 10/18/2022   Gait abnormality 10/18/2022   Pre-diabetes 09/25/2022   AAA (abdominal aortic aneurysm) ([place]) 09/24/2022   Essential hypertension 09/24/2022   Iron deficiency 04/04/2022   Hypertriglyceridemia 04/03/2022   Coronary artery calcification 01/18/2022   Candidal intertrigo    Emphysema of lung ([place]) 07/26/2021   Aortic atherosclerosis ([place]) 07/26/2021   Internal hemorrhoids 01/23/2021   Diverticula of colon 01/23/2021   Eczema  01/23/2021   Actinic keratoses 01/23/2021   Tobacco use 09/02/2020    ONSET DATE: 11/06/2022 (referral date)  REFERRING DIAG: AIDP, GBS  THERAPY DIAG:  Muscle weakness (generalized)  Unsteadiness on feet  Other abnormalities of gait and mobility  Rationale for Evaluation and Treatment: Rehabilitation  SUBJECTIVE:  SUBJECTIVE STATEMENT: Pt ambulated into clinic w/SPC. Reports his legs are even weaker today, "It is depressing to feel good for one week and then weak again". No falls.   Pt accompanied by: self and wife in lobby   PERTINENT HISTORY: Patient is in today for follow-up from his recent hospitalization. Mr. Lanahan was admitted at [facility] from 11/13-11/20/2023. He presented with right leg weakness and ascending numbness of his fingers and toes. He had extensive work up. 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 He was treated with a course of IVIG. He underwent some initial physical therapy. Due to some logistical challenges, the decision was made to discharge him to home, rather than move to inpatient rehab.   Since returning home, Mr. Bitting notes he had fluctuating weakness in the legs. Therefore returned to MD and was hospitalized with new diagnosis of AIDP on 11/02/22, went to inpatient rehab and then returned home 11/10/22.   Other issues were identified during his hospital stay, including some hyperglycemia with an A1c of 6.5%, a mildly high TSH with a normal T4, elevated blood pressures, and some dyspnea issues. His blood pressure was high enough that he was started on losartan 25 mg daily. He has been trying to quit smoking now in light of his current issues. Additionally, the hospital noted his infrarenal AAA.  PAIN:  Are you having pain? Does not rate, but does  report increased tingling in B feet, some pain in BLEs  PRECAUTIONS: Fall  WEIGHT BEARING RESTRICTIONS: No  FALLS: Has patient fallen in last 6 months? No  LIVING ENVIRONMENT: Lives with: lives with their family Lives in: House/apartment Stairs: No Has following equipment at home: Environmental consultant - 2 wheeled, shower chair, and Grab bars  PLOF: Requires assistive device for independence, Needs assistance with ADLs, and Needs assistance with homemaking  PATIENT GOALS: "I want to get back to normal."   OBJECTIVE:   DIAGNOSTIC FINDINGS: Most recent nerve testing revealed AIDP  COGNITION: Overall cognitive status: Within functional limits for tasks assessed   SENSATION: Light touch: Impaired B numbness and tingling up to thighs and also in B hands  COORDINATION: WFL heel to shin     POSTURE: rounded shoulders and forward head Has to sit off of L hip at times due to pain    FUNCTIONAL TESTS: From eval  5 times sit to stand: 18.63 secs with B UE support  Timed up and go (TUG): 22.63 secs without AD with min A 10 meter walk test: 2.38 ft/sec with RW Berg Balance Scale: 33/56 (down from 42/56 when left CIR)    TODAY'S TREATMENT:               Ther Ex  SciFit single peak level 3 for 8 minutes using BUE/BLEs for neural priming for reciprocal movement, dynamic cardiovascular warmup and global strength. RPE of 1/10 following activity.   NMR  In // bars for improved reactive balance strategies and LE coordination: Standing on airex  Ball tosses w/therapy tech w/wide BOS, x2 minutes with S*. Pt demonstrating BLE tremors but no instability noted  Progressed to ball tosses with narrow BOS, x2 minutes and CGA. One lateral LOB to R side but pt able to self-correct w/hip strategy.  Agility ladder drills without UE support, S* throughout: One foot per square, down and back x2 2 squares forward, one square backwards, down and back x2 One step inside square, one step outside square, down and  back x3. This sequence is most  challenging for pt but no LOB noted  Boxing w/tech holding pads and therapist guarding for improved reciprocal coordination, stepping strategy, truncal rotation and high amplitude movement:  Alt step fwd w/contralateral cross-body punch, x12 per side. Pt required mod cues for proper sequencing as he tended to step and punch with same side. Min cues for increased rotation and amplitude w/cross-body punch Added ipsilateral punch to above sequence, x8 reps per side.  Alt step w/ ipsilateral upper cut and then cross-body punch, x15 per side. Pt unable to sequence this initially, so verbally called out sequence (LLR, RRL) and then progressed to no cues once pt could perform independently.  RPE of 3/10  Seated march overs using 10# KB for improved core stability and bilateral hip abd/add strength:  Single leg march overs, x12 per side. Increased difficulty w/RLE > LLE  Double le march overs, x10 w/BUE support on mat  Fatigue at end of session: 4/10.   PATIENT EDUCATION: Education details: Continue HEP  Person educated: Patient Education method: Merchandiser, retail cues Education comprehension: verbalized understanding and needs further education  HOME EXERCISE PROGRAM: Access Code: [email or web address] URL: [email or web address] Date: 11/21/2022 Prepared by: Cameron Sprang  Exercises - Standing March with Counter Support  - 1-2 x daily - 5 x weekly - 1-2 sets - 5-10 reps - 3 hold - Standing Thoracic and Cervical Rotation with Reach at Wall  - 1-2 x daily - 5 x weekly - 1-2 sets - 3-5 reps - 5 hold - Mini Squat with Counter Support  - 1-2 x daily - 5 x weekly - 1-2 sets - 5-10 reps - 3 hold - Standing Hip Abduction with Counter Support  - 1 x daily - 7 x weekly - 1-2 sets - 10 reps - Standing Hip Extension with Counter Support  - 1 x daily - 7 x weekly - 1-2 sets - 10 reps - Supine Figure 4 Piriformis Stretch  - 2-3 x daily - 7 x weekly - 1 sets - 2-3 reps -  30 secs hold - Seated Piriformis Stretch with Trunk Bend  - 1 x daily - 7 x weekly - 3-4 reps - 30-60 second hold - Seated Diaphragmatic Breathing  - 1 x daily - 7 x weekly - 3 sets - 10 reps - 3-5 second hold  GOALS: Goals reviewed with patient? Yes  SHORT TERM GOALS: Target date: 12/15/22  Pt will be independent with initial HEP for improved strength, balance, transfers and gait. Baseline: Goal status: MET   2.  Pt will improve gait velocity to at least 2.75 ft/sec for improved gait efficiency and performance at S level  Baseline: 2.38 ft/sec with CGA and RW (1/3); 2.73 ft/s with RW and S*  Goal status: MET   3.  Pt will ambulate 500' w/LRAD and S* on 2MWT for improved endurance and efficiency with gait Baseline: 460' w/RW and CGA; 435' w/RW mod I Goal status: NOT MET    4. Pt will improve BERG balance score 4 points from baseline in order to indicate dec fall risk.   Baseline: 33/56; 36/56 on 2/2   Goal Status: PARTIALLY MET       LONG TERM GOALS: Target date: 01/13/2023   Pt will be independent with final HEP for improved strength, balance, transfers and gait. Baseline:  Goal status: INITIAL   2. Pt will improve gait velocity to at least 3.0 ft/sec with cane or no AD for improved gait efficiency and performance at Supervision level  Baseline:  2.38 ft/sec with RW and CGA Goal status: INITIAL   3.  Pt will improve 5 x STS to less than or equal to 15 seconds without UE support to demonstrate improved functional strength and transfer efficiency.  Baseline: 18.63 with BUE support  Goal status: INITIAL   4.  Pt will improve normal TUG to less than or equal to 18 seconds without AD for improved functional mobility and decreased fall risk. Baseline: 15.97 sec (12/8) Goal status: INITIAL   5. Pt will ambulate 550' w/LRAD and mod I on 2MWT for improved endurance and efficiency with gait Baseline: 460' w/RW and CGA Goal status: REVISED  6. Pt will improve BERG balance score 8  points from baseline in order to indicate dec fall risk.   Baseline: 33/56  Goal Status: INITIAL  7.  Pt will negotiate up/down 4 steps with single rail/cane, up/down ramp and curb and ambulate x 500' outdoors over unlevel paved surfaces at S level with cane or less in order to indicate improved community mobility.   Baseline:    Goal Status: INITIAL      ASSESSMENT:  CLINICAL IMPRESSION: Emphasis of skilled PT session on LE coordination, reactive balance strategies and core stability. Pt reports legs are worse today compared to Wednesday which is discouraging. Pt tolerated session well, rating 1/10 fatigue at beginning of session. Pt most challenged w/reciprocal coordination tasks but had no LOB or instability w/activity. Continue POC.    OBJECTIVE IMPAIRMENTS: Abnormal gait, cardiopulmonary status limiting activity, decreased activity tolerance, decreased balance, decreased endurance, decreased knowledge of use of DME, decreased mobility, difficulty walking, decreased strength, impaired perceived functional ability, impaired sensation, impaired UE functional use, improper body mechanics, and postural dysfunction.   ACTIVITY LIMITATIONS: carrying, lifting, bending, standing, squatting, stairs, transfers, bathing, reach over head, and locomotion level  PARTICIPATION LIMITATIONS: meal prep, cleaning, laundry, driving, shopping, community activity, and yard work  PERSONAL FACTORS: Past/current experiences and 3+ comorbidities: see above  are also affecting patient's functional outcome.   REHAB POTENTIAL: Excellent  CLINICAL DECISION MAKING: Evolving/moderate complexity  EVALUATION COMPLEXITY: Moderate  PLAN:  PT FREQUENCY: 3x/week  PT DURATION: 8 weeks  PLANNED INTERVENTIONS: Therapeutic exercises, Therapeutic activity, Neuromuscular re-education, Balance training, Gait training, Patient/Family education, Self Care, Stair training, Vestibular training, DME instructions, and Aquatic  Therapy  PLAN FOR NEXT SESSION:  How is HEP? dynamic standing balance with small ranges initially with focus on control, compliant surfaces, gait with and without cane with obstacles and turns, activities without AD as able.  Ankle strategy, narrowed BOS/tandem. Staggered stance DL, KB swings, Bosu ball tasks, boxing   Charlett Nose, PT, [facility] [address] [place], [place]  [address] Phone:  [phone] Fax:  [phone] 12/28/22, 11:50 AM

## 2022-12-29 ENCOUNTER — Ambulatory Visit
Admission: RE | Admit: 2022-12-29 | Discharge: 2022-12-29 | Disposition: A | Discharge status: Home / Self Care | Payer: Medicare PPO | Source: Ambulatory Visit | Attending: Neurology | Admitting: Neurology

## 2022-12-29 DIAGNOSIS — M5442 Lumbago with sciatica, left side: Secondary | ICD-10-CM | POA: Diagnosis not present

## 2022-12-29 DIAGNOSIS — R269 Unspecified abnormalities of gait and mobility: Secondary | ICD-10-CM | POA: Diagnosis not present

## 2022-12-29 MED ORDER — GADOPICLENOL 0.5 MMOL/ML IV SOLN
9.0000 mL | Freq: Once | INTRAVENOUS | Status: AC | PRN
  Administered 2022-12-29: 9 mL via INTRAVENOUS

## 2022-12-31 ENCOUNTER — Encounter: Payer: Medicare PPO | Admitting: Occupational Therapy

## 2022-12-31 ENCOUNTER — Ambulatory Visit: Payer: Medicare PPO | Admitting: Physical Therapy

## 2022-12-31 DIAGNOSIS — G610 Guillain-Barre syndrome: Secondary | ICD-10-CM | POA: Diagnosis not present

## 2023-01-01 ENCOUNTER — Ambulatory Visit (INDEPENDENT_AMBULATORY_CARE_PROVIDER_SITE_OTHER): Payer: Medicare PPO | Admitting: Neurology

## 2023-01-01 ENCOUNTER — Encounter: Payer: Self-pay | Admitting: Neurology

## 2023-01-01 VITALS — BP 146/80 | HR 60 | Ht 70.0 in | Wt 195.0 lb

## 2023-01-01 DIAGNOSIS — G6181 Chronic inflammatory demyelinating polyneuritis: Secondary | ICD-10-CM

## 2023-01-01 DIAGNOSIS — R269 Unspecified abnormalities of gait and mobility: Secondary | ICD-10-CM

## 2023-01-01 DIAGNOSIS — M5416 Radiculopathy, lumbar region: Secondary | ICD-10-CM | POA: Diagnosis not present

## 2023-01-01 DIAGNOSIS — G610 Guillain-Barre syndrome: Secondary | ICD-10-CM | POA: Diagnosis not present

## 2023-01-01 NOTE — Progress Notes (Signed)
Chief Complaint  Patient presents with   Follow-up    Rm 15. Accompanied by wife, [name]. No new concerns. C/o intermittent symptoms.      ASSESSMENT AND PLAN  Joe Greene is a 72 y.o. male   Chronic inflammatory demyelinating polyradiculoneuropathy  Started since early November 2023 following upper respiratory infection, loss of taste 2 weeks prior to the symptom onset  mild to moderate improvement following IVIG from November 14-18 2023, began to have worsening symptoms again since early December 2023, hospital admission again on Dec 14 for plasma Change, did help his symptoms, following that, at his best, he can ambulate without assistance, Hospital admission again on January 4th 2024, IVIG 2 g/kg completed on January 8, Out patient IVIG, 2 g/kg loading dose on the week of December 10, 2022, then 1 g/kg every 3 weeks, his symptoms began to improve steadily, improved gait, less neuropathic pain, which has helped by Cymbalta 30 mg, will titrating to 60 mg daily, gabapentin 300 mg as needed  Worsening gait abnormality, low back pain, radiating pain to left lower extremity, left hip,  X-ray of left hip showed no acute abnormality  MRI of lumbar showed multilevel degenerative changes, most noticeable L3-4 L4-5, with moderate to severe foraminal narrowing   Return To Clinic With in  5 month with NP  DIAGNOSTIC DATA (LABS, IMAGING, TESTING) - I reviewed patient records, labs, notes, testing and imaging myself where available.  MEDICAL HISTORY:  Joe Greene, is a 72 year old male seen in request by his primary care physician Dr. Haydee Salter, to follow-up for hospital discharge for Guillain-Barr syndrome, initial evaluation October 18, 2022 accompanied by his wife  I reviewed and summarized the referring note. PMHX HTN Smoker, 2ppd, quit since Sep 23 2022.  Patient has been active all his life, began to noticed toes and fingertips numbness tingling on September 17, 2022, prior to that,  he has suffered upper respiratory infection for 2 weeks, cough, loss of taste,  By November 10, he noticed progressive numbness upper and lower extremity, and also developed gait abnormality,  Symptoms continue to progressively getting worse, leading to emergency room presentation September 24, 2022, also had intractable headache,  He was admitted with diagnosis of probable Guillain-Barr, lumbar puncture September 25, 2022 showed wbc, 4, RBC 0,  TP 89, Glucose 80,   He had 5 days of IVIG, began to notice improvement shortly afterwards, continue to make progress, to the point he can ambulate without assistance at home for few days, but since early December, he noticed regression, increased numbness, gait abnormality,   Update October 24, 2022: Patient returns for electrodiagnostic study today, which confirmed acute demyelinating polyradiculoneuropathy, there was no evidence of significant axonal loss He had symptom onset early November, received IVIG from November 14 for 5 consecutive days, he began to notice an after second and third dose, the benefit lasted about 2 weeks, at his best, he could take few steps at home without assistant,  Since beginning of December 2023, 2 weeks after IVIG infusion, he began to notice worsening bilateral lower extremity ascending numbness, weakness, now could barely take a step, sitting in wheelchair most of the time, also frequent shortness of breath episode, sometimes waking up in the middle of the night  Also complains of difficulty emptying his bladder, urinary frequency, baseline elevated heart rate, consistent with autonomic involvement  We have attempted outpatient IVIG infusion, but due to insurance and preauthorization limitations, it needed few weeks to complete the process.  With his worsening symptoms, especially autonomic involvement, shortness of breath, continue progress of symptoms, per patient even worse than prior to treatment level in the middle  of November, will refer him to hospital for either repeat IVIG or even consider plasma exchange  UPDATE Nov 21 2022: He was admitted to hospital following his electrodiagnostic study because of his complaints of worsening ascending paresthesia, gait abnormality  Central line placed on 12/14 he was started on plasma exchange, 5 rounds, every other day, with improvement in symptoms.  He completed  NIF-32 and vital capacity 2.7 L.  Then was discharged to inpatient rehabilitation on November 02, 2022, which continues improvement, at his best, he can ambulate without assistance  Shortly after he was discharged home on November 10, 2022, he noticed regression, hospital admission again on January 4th, IVIG 2 g/kg Completed on January 8th 2023, this round, he only noticed mild improvement  Discharge neurological examination showed normal upper extremity strength, mild distal upper extremity, hip flexion, distal leg weakness,  Patient complains of continued lower extremity paresthesia, weakness, gait abnormality, fingertips paresthesia, loss of dexterity of his fingers  He denies swallowing difficulty, breathing difficulty, no significant autonomic symptoms  UPDATE Dec 11 2022: Patient is accompanied by his wife at today's clinical visit, continue worsening lower extremity weakness, increased gait abnormality about 2 to 3 weeks following previous IVIG infusion, started outpatient IVIG loading 2 g/kg since December 10, 2021, tolerating infusion well  He complains of slow worsening low back pain, radiating pain to left hip, lateral thigh, paresthesia, increased after bearing weight, feels very discouraged that he has recurrent symptoms even with frequent IVIG treatment, also complains of intermittent upper extremity paresthesia, extending to above wrist level  UPDATE Feb 202 24: He is accompanied his wife at today's visit, overall doing better, continue have low back pain radiating pain to left hip,  intermittent bilateral lower extremity paresthesia, unsteady gait uses cane, but overall has much improved, IVIG every 3 weeks has been helpful, last infusion was on February 19 and 20  Personally reviewed MRI of lumbar December 29, 2022, multilevel degenerative changes, prominent disc and facet degenerative disease most prominent at L4-5, severe bilateral foraminal narrowing, L3-4 moderate bilateral foraminal narrowing  He denies bowel or bladder incontinence, intermittent bilateral hands paresthesia  Left hip x-ray showed no significant abnormality  He tolerated Cymbalta 30 mg,  PHYSICAL EXAM:   Vitals:   01/01/23 1258  BP: (!) 146/80  Pulse: 60  Weight: 195 lb (88.5 kg)  Height: 5' 10"$  (1.778 m)   Body mass index is 27.98 kg/m.  PHYSICAL EXAMNIATION:  Gen: NAD, conversant, well nourised, well groomed                     Cardiovascular: Regular rate rhythm, no peripheral edema, warm, nontender. Eyes: Conjunctivae clear without exudates or hemorrhage Neck: Supple, no carotid bruits. Pulmonary: Clear to auscultation bilaterally   NEUROLOGICAL EXAM:  MENTAL STATUS: Speech/cognition: Awake, alert, oriented to history taking and casual conversation, normal breathing CRANIAL NERVES: CN II: Visual fields are full to confrontation. Pupils are round equal and briskly reactive to light. CN III, IV, VI: extraocular movement are normal. No ptosis. CN V: Facial sensation is intact to light touch CN VII: Face is symmetric with normal eye closure  CN VIII: Hearing is normal to causal conversation. CN IX, X: Phonation is normal. CN XI: Head turning and shoulder shrug are intact  MOTOR: He has no significant bilateral upper and lower  extremity muscle weakness   REFLEXES: Areflexia  SENSORY:  Length-dependent decreased light touch, pinprick sensation to midfoot level; vibratory sensation to ankle level; decreased toe proprioception Absent finger vibratory sensation, preserved finger  proprioception, decreased pinprick to wrist level  COORDINATION: There is no trunk or limb dysmetria noted.  GAIT/STANCE: Able to get up from seated position arm crossed, wide-based, cautious, positive Romberg signs,  REVIEW OF SYSTEMS:  Full 14 system review of systems performed and notable only for as above All other review of systems were negative.   ALLERGIES: No Known Allergies  HOME MEDICATIONS: Current Outpatient Medications  Medication Sig Dispense Refill   acetaminophen (TYLENOL) 325 MG tablet Take 1-2 tablets (325-650 mg total) by mouth every 4 (four) hours as needed for mild pain.     aspirin EC 81 MG tablet Take 81 mg by mouth in the morning.     Cyanocobalamin (B-12 PO) Take 1 tablet by mouth daily with lunch.     DOCUSATE SODIUM PO Take 1 capsule by mouth daily as needed (constipation).     DULoxetine (CYMBALTA) 30 MG capsule Take 1 capsule (30 mg total) by mouth daily. 30 capsule 0   ferrous sulfate 325 (65 FE) MG EC tablet Take 325 mg by mouth daily with lunch.     Fluocinonide (LIDEX EX) Apply 1 application  topically daily as needed (rash).     gabapentin (NEURONTIN) 300 MG capsule Take 2 capsules (600 mg total) by mouth 3 (three) times daily. 180 capsule 11   ibuprofen (ADVIL) 200 MG tablet Take 400 mg by mouth daily as needed for headache or mild pain.     Multiple Vitamins-Minerals (CENTRUM SILVER) tablet Take 1 tablet by mouth daily with lunch.     nicotine (NICODERM CQ - DOSED IN MG/24 HOURS) 14 mg/24hr patch Place 1 patch (14 mg total) onto the skin daily. 28 patch 0   Omega-3 Fatty Acids (FISH OIL PO) Take 1 capsule by mouth daily with lunch. When able to remember     sildenafil (VIAGRA) 100 MG tablet Take 100 mg by mouth daily as needed for erectile dysfunction.     DULoxetine (CYMBALTA) 60 MG capsule Take 1 capsule (60 mg total) by mouth daily. (Patient not taking: Reported on 12/19/2022) 90 capsule 3   No current facility-administered medications for this  visit.    PAST MEDICAL HISTORY: Past Medical History:  Diagnosis Date   AAA (abdominal aortic aneurysm) ([place])    Cataract    forming    Guillain Barr syndrome ([place])     PAST SURGICAL HISTORY: Past Surgical History:  Procedure Laterality Date   FOOT SURGERY Right    HERNIA REPAIR Right    IR FLUORO GUIDE CV LINE RIGHT  10/25/2022   IR US GUIDE VASC ACCESS RIGHT  10/25/2022   KNEE ARTHROSCOPY Left    WISDOM TOOTH EXTRACTION      FAMILY HISTORY: Family History  Problem Relation Age of Onset   Stroke Mother 27   AAA (abdominal aortic aneurysm) Mother    Cancer Father        Prostate   Colon cancer Neg Hx    Colon polyps Neg Hx    Esophageal cancer Neg Hx    Rectal cancer Neg Hx    Stomach cancer Neg Hx     SOCIAL HISTORY: Social History   Socioeconomic History   Marital status: Married    Spouse name: Burl Brookins   Number of children: 2   Years of  education: Not on file   Highest education level: Not on file  Occupational History   Occupation: Retired    Comment: Engineer, building services- [employer]  Tobacco Use   Smoking status: Former    Packs/day: 2.00    Years: 50.00    Total pack years: 100.00    Types: Cigarettes    Quit date: 09/18/2022    Years since quitting: 0.2   Smokeless tobacco: Never  Vaping Use   Vaping Use: Never used  Substance and Sexual Activity   Alcohol use: Yes    Alcohol/week: 3.0 standard drinks of alcohol    Types: 3 Standard drinks or equivalent per week    Comment: 2 or 3 drinks per week   Drug use: Never   Sexual activity: Yes  Other Topics Concern   Not on file  Social History Narrative   Not on file   Social Determinants of Health   Financial Resource Strain: Low Risk  (12/17/2022)   Overall Financial Resource Strain (CARDIA)    Difficulty of Paying Living Expenses: Not hard at all  Food Insecurity: No Food Insecurity (12/17/2022)   Hunger Vital Sign    Worried About Running Out of Food in the Last Year: Never true     Ran Out of Food in the Last Year: Never true  Transportation Needs: No Transportation Needs (12/17/2022)   PRAPARE - Hydrologist (Medical): No    Lack of Transportation (Non-Medical): No  Physical Activity: Inactive (12/17/2022)   Exercise Vital Sign    Days of Exercise per Week: 0 days    Minutes of Exercise per Session: 0 min  Stress: No Stress Concern Present (12/17/2022)   [place]    Feeling of Stress : Not at all  Social Connections: Moderately Integrated (11/14/2021)   Social Connection and Isolation Panel [NHANES]    Frequency of Communication with Friends and Family: Twice a week    Frequency of Social Gatherings with Friends and Family: Twice a week    Attends Religious Services: More than 4 times per year    Active Member of [employer] or Organizations: No    Attends Archivist Meetings: Never    Marital Status: Married  Human resources officer Violence: Not At Risk (11/18/2022)   Humiliation, Afraid, Rape, and Kick questionnaire    Fear of Current or Ex-Partner: No    Emotionally Abused: No    Physically Abused: No    Sexually Abused: No      Marcial Pacas, M.D. Ph.D.  [facility] [address] [place], [place] [address] Ph: [phone] Fax: [phone]  CC:  Haydee Salter, MD [place],  [place] [phone]  Haydee Salter, MD

## 2023-01-02 ENCOUNTER — Encounter: Payer: Self-pay | Admitting: Rehabilitation

## 2023-01-02 ENCOUNTER — Ambulatory Visit: Payer: Medicare PPO | Admitting: Occupational Therapy

## 2023-01-02 ENCOUNTER — Encounter: Payer: Self-pay | Admitting: Occupational Therapy

## 2023-01-02 ENCOUNTER — Ambulatory Visit: Payer: Medicare PPO | Admitting: Rehabilitation

## 2023-01-02 DIAGNOSIS — G610 Guillain-Barre syndrome: Secondary | ICD-10-CM

## 2023-01-02 DIAGNOSIS — M6281 Muscle weakness (generalized): Secondary | ICD-10-CM

## 2023-01-02 DIAGNOSIS — R2681 Unsteadiness on feet: Secondary | ICD-10-CM | POA: Diagnosis not present

## 2023-01-02 DIAGNOSIS — R2689 Other abnormalities of gait and mobility: Secondary | ICD-10-CM | POA: Diagnosis not present

## 2023-01-02 DIAGNOSIS — R278 Other lack of coordination: Secondary | ICD-10-CM

## 2023-01-02 DIAGNOSIS — R208 Other disturbances of skin sensation: Secondary | ICD-10-CM | POA: Diagnosis not present

## 2023-01-02 NOTE — Therapy (Signed)
OUTPATIENT OCCUPATIONAL THERAPY NEURO DISCHARGE SUMMARY   Patient Name: Joe Greene MRN: [id number] DOB:12/02/1950, 72 y.o., male Today's Date: 01/02/2023  PCP: Dr. Gena Greene REFERRING PROVIDER: Dr. Gena Greene  OCCUPATIONAL THERAPY DISCHARGE SUMMARY  Visits from Start of Care: 10  Current functional level related to goals / functional outcomes: Patient has met 5/5 short-term goals and 3/3 long-term goals to date.   Remaining deficits: No significant BUE deficits; however, patient continues to demonstrate increased difficulty with functional mobility   Education / Equipment: Continue with OT HEP as noted in patient instructions following OT discharge.  Patient agrees to discharge. Patient goals were met. Patient is being discharged due to meeting the stated rehab goals..     END OF SESSION:  OT End of Session - 01/02/23 1009     Visit Number 10    Number of Visits 25    Date for OT Re-Evaluation 02/04/23    Authorization Type [employer]    Progress Note Due on Visit 10    OT Start Time 1015    OT Stop Time 1058    OT Time Calculation (min) 43 min    Activity Tolerance Patient tolerated treatment well    Behavior During Therapy WFL for tasks assessed/performed             Past Medical History:  Diagnosis Date   AAA (abdominal aortic aneurysm) ([place])    Cataract    forming    Guillain Barr syndrome [facility])    Past Surgical History:  Procedure Laterality Date   FOOT SURGERY Right    HERNIA REPAIR Right    IR FLUORO GUIDE CV LINE RIGHT  10/25/2022   IR US GUIDE VASC ACCESS RIGHT  10/25/2022   KNEE ARTHROSCOPY Left    WISDOM TOOTH EXTRACTION     Patient Active Problem List   Diagnosis Date Noted   Lumbar radiculopathy 01/01/2023   Left-sided low back pain with left-sided sciatica 12/11/2022   CIDP (chronic inflammatory demyelinating polyneuropathy) ([place]) 12/11/2022   Constipation 11/15/2022   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[place]) 11/02/2022   Acute sensory  neuropathy 10/25/2022   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[place]) 10/18/2022   B12 deficiency 10/18/2022   Gait abnormality 10/18/2022   Pre-diabetes 09/25/2022   AAA (abdominal aortic aneurysm) ([place]) 09/24/2022   Essential hypertension 09/24/2022   Iron deficiency 04/04/2022   Hypertriglyceridemia 04/03/2022   Coronary artery calcification 01/18/2022   Candidal intertrigo    Emphysema of lung ([place]) 07/26/2021   Aortic atherosclerosis ([place]) 07/26/2021   Internal hemorrhoids 01/23/2021   Diverticula of colon 01/23/2021   Eczema 01/23/2021   Actinic keratoses 01/23/2021   Tobacco use 09/02/2020    ONSET DATE: 11/07/23  REFERRING DIAG:  Diagnosis  G35 (ICD-10-CM) - Multiple sclerosis  G61.0 (ICD-10-CM) - Guillain-Barre syndrome  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  He continues to deny functional limitations with respect to his upper extremities and is pleased with his current progress.  He feels he is completing ADLs and IADLs at desired functional level though remains most limited by impairments with functional  mobility including lower extremity strength and balance.  Pt accompanied by: self  PERTINENT HISTORY: Pt is a 72 yo M who presents with 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 PMH sig for GBS, HTN, COPD, MS.  Patient is in today for follow-up from his recent hospitalization. Joe Greene was admitted at [facility] from 11/13-11/20/2023.  He presented with right leg weakness and ascending numbness of his fingers and toes. He had extensive work up. 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 He was treated with a course of IVIG. He underwent some initial physical therapy. Due to some logistical challenges, the decision was made to discharge him to home, rather than move to inpatient rehab.   Since returning home, Joe Greene notes he had fluctuating weakness in the legs. Therefore returned to MD and was hospitalized with new diagnosis of AIDP on 11/02/22, went to inpatient rehab and then returned home 11/10/22.   Other issues were identified during his hospital stay, including some hyperglycemia with an A1c of 6.5%, a mildly high TSH with a normal T4, elevated blood pressures, and some dyspnea issues. His blood pressure was high enough that he was started on losartan 25 mg daily. He has been trying to quit smoking now in light of his current issues. Additionally, the hospital noted his infrarenal AAA.   PRECAUTIONS: Fall  WEIGHT BEARING RESTRICTIONS: No  PAIN:  Are you having pain? 1/10 L hip   FALLS: Has patient fallen in last 6 months? No  LIVING ENVIRONMENT: Lives with: lives with their family and lives with their spouse Lives in: House/apartment Stairs: No Has following equipment at home: Ramped entry  PLOF: Independent  PATIENT GOALS: Get back to using hands normally  OBJECTIVE:   HAND DOMINANCE: Right  ADLs: Overall ADLs: increased time required Transfers/ambulation related to ADLs:uses walker  Eating: mod I Grooming: mod I UB Dressing: set up LB Dressing: set up Toileting: mod I with walker Bathing: walk in shower with grab bar has 3 in 1 but does not use, mod A  Tub Shower transfers: min A Equipment:  3 in 1  IADLs: Shopping: dependent Light housekeeping: dependent Meal Prep: does not Sport and exercise psychologist mobility: supervision Medication management: pt's wife assists Health visitor: pt assists but difficulty writing Handwriting: 100% legible  MOBILITY STATUS:  uses walker  POSTURE COMMENTS:  No Significant postural limitations    FUNCTIONAL OUTCOME MEASURES: Upper Extremity Functional Scale (UEFS): 40% impairment  UPPER EXTREMITY ROM:  WFLS for bilateral UE's   UPPER EXTREMITY MMT:     MMT Right eval Left eval  Shoulder flexion 4+/5 4+/5  Elbow flexion 4+/5 4+/5  Elbow extension 4+/5 4+/5  (Blank rows = not tested)  HAND FUNCTION: Grip strength: Right: 73.1 lbs; Left: 83.3 lbs  12/12/22-9 hole peg test R 27.97, L 26.84 R 70.5 lbs , 72.3 lbs   COORDINATION: 9 Hole Peg test: Right: 36.41 sec; Left: 28.94 sec Box and Blocks:  Right 49blocks, Left 50blocks  SENSATION: Light touch: Impaired  for hands   EDEMA: n/a   COGNITION: Overall cognitive status: Within functional limits for tasks assessed however a little impulsive with testing  VISION: Subjective report: denies visual changes Baseline vision: Wears contacts  OBSERVATIONS: Pt appears anxious and moves quickly during testing.   TODAY'S TREATMENT: 12/24/22  - Self-care/home management completed for duration as noted below including: Therapist reviewed goals with patient and updated patient progression.  No additional functional limitations identified. OT educated pt on additional coordination  exercises for BUE as noted in patient instructions.  OT had patient demonstrate loading and unloading the dishwasher, which patient demonstrated with good safety.  Therapist also hide patient obtaining old on from overhead shelf.  Discussed additional safety recommendations as needed. PATIENT EDUCATION: Education details: Coordination exercises; HEP; OT discharge; AD for IADL completion  Person educated: Patient Education method: Explanation, Demonstration, Verbal cues, and Handouts Education comprehension: verbalized understanding, returned demonstration, and verbal cues required      HOME EXERCISE PROGRAM: 12/12/22- green putty, coordination 12/17/22- red theraband , additions on 12/19/22 Access Code: [email or web address] URL: [email or web address] Date: 12/19/2022 Prepared by: Theone Murdoch  Exercises - Seated Shoulder Front Raise with Resistance  - 1 x daily - 7 x weekly - 3 sets - 15 reps - Seated Shoulder Row with Resistance Anchored at Feet  - 1 x daily - 7 x weekly - 1 sets - 15 reps - Seated Shoulder Extension with Resistance  - 1 x daily - 7 x weekly - 3 sets - 15 reps  GOALS: Goals reviewed with patient? Yes  SHORT TERM GOALS: Target date: 01/02/23  I with HEP  Goal status: MET  2.  Pt will perform all basic ADLS modified independently  Goal status: MET  3.  Pt will increase RUE grip strength by 5 lbs for increased functional use. Baseline: RUE 73.1lbs, LUE 83.3 lbs Goal status: MET, RUE 95, LUE 89.9  4.  I with adapted strategies, and AE to increase safety and I with ADLS/ IADLs (safety for showering)  Goal status:  MET, pt has a shower seat that he is using now( 12/26/22) Pt has been shown to hold onto counter if reaching lower for items in dishwasher, encouraged to unload top shelf first  5.  Pt will perform light home management modified independently demonstrating good safety awareness.  Goal status: MET   LONG TERM GOALS: Target date: 02/04/23  I with updated HEP for bilateral UE strength   Goal status: MET  2.  Pt will improve UEFS score to 35% impairment Baseline: 40% impairment  12/28/2022: 20% impairment Goal status: MET  3.  Pt will demonstrate improved RUE fine motor coordination as evidenced by decreasing 9 hole peg test score by 3 secs without drops Baseline: RUE 36.41, LUE 28.94  1/31: R 27.97, L 26.84 2/21: R 25.49 (no drops), L 28.11; 30.25; 25.67 (average 1 drop) Goal status: MET  ASSESSMENT:  CLINICAL IMPRESSION:  Pt has met all goals to dates and does not demonstrate significant functional limitations with  respect to use of BUE.  No further needs identified.  No further need for skilled OT services at this time.  PERFORMANCE DEFICITS: in functional skills including ADLs, IADLs, coordination, dexterity, sensation, strength, flexibility, Fine motor control, Gross motor control, mobility, balance, decreased knowledge of precautions, decreased knowledge of use of DME, and UE functional use, cognitive skills including safety awareness, and psychosocial skills including coping strategies, environmental adaptation, habits, interpersonal interactions, and routines and behaviors.   IMPAIRMENTS: are limiting patient from ADLs, IADLs, play, leisure, and social participation.   CO-MORBIDITIES: may have co-morbidities  that affects occupational performance. Patient will benefit from skilled OT to address above impairments and improve overall function.  REHAB POTENTIAL: Good  PLAN:  OT d/c completed   Dennis Bast, OT 01/02/2023, 5:17 PM

## 2023-01-02 NOTE — Therapy (Signed)
OUTPATIENT PHYSICAL THERAPY NEURO THERAPY    Patient Name: Joe Greene MRN: [id number] DOB:09/17/1951, 72 y.o., male Today's Date: 01/02/2023   PCP: Arlester Marker, MD REFERRING PROVIDER: Marlowe Shores, PA-C  END OF SESSION:  PT End of Session - 01/02/23 1101     Visit Number 18    Number of Visits 25    Date for PT Re-Evaluation 01/13/23    Authorization Type [employer] [place]    Progress Note Due on Visit 10    PT Start Time [id number]    PT Stop Time [id number]    PT Time Calculation (min) 43 min    Equipment Utilized During Treatment Gait belt    Activity Tolerance Patient tolerated treatment well    Behavior During Therapy WFL for tasks assessed/performed                     Past Medical History:  Diagnosis Date   AAA (abdominal aortic aneurysm) ([place])    Cataract    forming    Guillain Barr syndrome ([place])    Past Surgical History:  Procedure Laterality Date   FOOT SURGERY Right    HERNIA REPAIR Right    IR FLUORO GUIDE CV LINE RIGHT  10/25/2022   IR US GUIDE VASC ACCESS RIGHT  10/25/2022   KNEE ARTHROSCOPY Left    WISDOM TOOTH EXTRACTION     Patient Active Problem List   Diagnosis Date Noted   Lumbar radiculopathy 01/01/2023   Left-sided low back pain with left-sided sciatica 12/11/2022   CIDP (chronic inflammatory demyelinating polyneuropathy) ([place]) 12/11/2022   Constipation 11/15/2022   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[place]) 11/02/2022   Acute sensory neuropathy 10/25/2022   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[place]) 10/18/2022   B12 deficiency 10/18/2022   Gait abnormality 10/18/2022   Pre-diabetes 09/25/2022   AAA (abdominal aortic aneurysm) ([place]) 09/24/2022   Essential hypertension 09/24/2022   Iron deficiency 04/04/2022   Hypertriglyceridemia 04/03/2022   Coronary artery calcification 01/18/2022   Candidal intertrigo    Emphysema of lung ([place]) 07/26/2021   Aortic atherosclerosis ([place]) 07/26/2021   Internal hemorrhoids 01/23/2021    Diverticula of colon 01/23/2021   Eczema 01/23/2021   Actinic keratoses 01/23/2021   Tobacco use 09/02/2020    ONSET DATE: 11/06/2022 (referral date)  REFERRING DIAG: AIDP, GBS  THERAPY DIAG:  Muscle weakness (generalized)  Other abnormalities of gait and mobility  Other disturbances of skin sensation  Rationale for Evaluation and Treatment: Rehabilitation  SUBJECTIVE:  SUBJECTIVE STATEMENT: Reports feeling pretty good today.  Mobility seems to be improving.   Pt accompanied by: self and wife in lobby   PERTINENT HISTORY: Patient is in today for follow-up from his recent hospitalization. Mr. Mashek was admitted at [facility] from 11/13-11/20/2023. He presented with right leg weakness and ascending numbness of his fingers and toes. He had extensive work up. 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 He was treated with a course of IVIG. He underwent some initial physical therapy. Due to some logistical challenges, the decision was made to discharge him to home, rather than move to inpatient rehab.   Since returning home, Mr. Tramell notes he had fluctuating weakness in the legs. Therefore returned to MD and was hospitalized with new diagnosis of AIDP on 11/02/22, went to inpatient rehab and then returned home 11/10/22.   Other issues were identified during his hospital stay, including some hyperglycemia with an A1c of 6.5%, a mildly high TSH with a normal T4, elevated blood pressures, and some dyspnea issues. His blood pressure was high enough that he was started on losartan 25 mg daily. He has been trying to quit smoking now in light of his current issues. Additionally, the hospital noted his infrarenal AAA.  PAIN:  Are you having pain? Does not rate, but does report increased  tingling in B feet, some pain in BLEs  PRECAUTIONS: Fall  WEIGHT BEARING RESTRICTIONS: No  FALLS: Has patient fallen in last 6 months? No  LIVING ENVIRONMENT: Lives with: lives with their family Lives in: House/apartment Stairs: No Has following equipment at home: Environmental consultant - 2 wheeled, shower chair, and Grab bars  PLOF: Requires assistive device for independence, Needs assistance with ADLs, and Needs assistance with homemaking  PATIENT GOALS: "I want to get back to normal."   OBJECTIVE:   DIAGNOSTIC FINDINGS: Most recent nerve testing revealed AIDP  COGNITION: Overall cognitive status: Within functional limits for tasks assessed   SENSATION: Light touch: Impaired B numbness and tingling up to thighs and also in B hands  COORDINATION: WFL heel to shin     POSTURE: rounded shoulders and forward head Has to sit off of L hip at times due to pain    FUNCTIONAL TESTS: From eval  5 times sit to stand: 18.63 secs with B UE support  Timed up and go (TUG): 22.63 secs without AD with min A 10 meter walk test: 2.38 ft/sec with RW Berg Balance Scale: 33/56 (down from 42/56 when left CIR)    TODAY'S TREATMENT:                Gait:  Performed gait outdoors with cane (mostly holding) x 700' on all types of surfaces (grassy, paved, curbs) as well as negotiating up/down steep hill out back behind clinic.  All at S to mod I level (S for hill negotiation).  He discussed that he would like to be able to get down onto ground and help plant things at his church. This was addressed indoors.   NMR: Agility ladder exercises-attempting to remain on toes throughout, quick forward stepping with each foot landing in square before moving to next x 4 laps, quick side steps in same fashion x 4 laps (verbal and tactile cues to remain facing forward as he tends to turn during task), quick forward step into square then to outside of next square x 4 laps.  Continued to have some difficulty remaining on  toes, however we did move away from ladder and  he is able to achieve heel raise B, walk in heel raise and do a modified skip remaining on toes so moved back to ladder and performed forward jumping into each square x 3 laps.   Standing on purple balance beam-standing holding balance x 2 reps of 20 secs>side stepping x 4 laps, tandem gait on beam x 4 laps with min/guard to min A and cues for proper foot placement.    Stand<>floor transfer to blue therapy mat x 4 reps.  He is better able to go down on L knee letting R LE control descent.  Also better able to get to stand from R half kneel.    Standing on grass outside performing alt cone tapping x 20 reps, kicking soccer ball x 100' x 2 reps all with S   Showed pt gardening bench in session and demo'd how to either sit on bench or kneel.  Pt verbalized understanding.  Educated that he could purchase from [employer].   PATIENT EDUCATION: Education details: Continue HEP  Person educated: Patient Education method: Merchandiser, retail cues Education comprehension: verbalized understanding and needs further education  HOME EXERCISE PROGRAM: Access Code: [email or web address] URL: [email or web address] Date: 11/21/2022 Prepared by: Cameron Sprang  Exercises - Standing March with Counter Support  - 1-2 x daily - 5 x weekly - 1-2 sets - 5-10 reps - 3 hold - Standing Thoracic and Cervical Rotation with Reach at Wall  - 1-2 x daily - 5 x weekly - 1-2 sets - 3-5 reps - 5 hold - Mini Squat with Counter Support  - 1-2 x daily - 5 x weekly - 1-2 sets - 5-10 reps - 3 hold - Standing Hip Abduction with Counter Support  - 1 x daily - 7 x weekly - 1-2 sets - 10 reps - Standing Hip Extension with Counter Support  - 1 x daily - 7 x weekly - 1-2 sets - 10 reps - Supine Figure 4 Piriformis Stretch  - 2-3 x daily - 7 x weekly - 1 sets - 2-3 reps - 30 secs hold - Seated Piriformis Stretch with Trunk Bend  - 1 x daily - 7 x weekly - 3-4 reps - 30-60 second hold -  Seated Diaphragmatic Breathing  - 1 x daily - 7 x weekly - 3 sets - 10 reps - 3-5 second hold  GOALS: Goals reviewed with patient? Yes  SHORT TERM GOALS: Target date: 12/15/22  Pt will be independent with initial HEP for improved strength, balance, transfers and gait. Baseline: Goal status: MET   2.  Pt will improve gait velocity to at least 2.75 ft/sec for improved gait efficiency and performance at S level  Baseline: 2.38 ft/sec with CGA and RW (1/3); 2.73 ft/s with RW and S*  Goal status: MET   3.  Pt will ambulate 500' w/LRAD and S* on 2MWT for improved endurance and efficiency with gait Baseline: 460' w/RW and CGA; 435' w/RW mod I Goal status: NOT MET    4. Pt will improve BERG balance score 4 points from baseline in order to indicate dec fall risk.   Baseline: 33/56; 36/56 on 2/2   Goal Status: PARTIALLY MET       LONG TERM GOALS: Target date: 01/13/2023   Pt will be independent with final HEP for improved strength, balance, transfers and gait. Baseline:  Goal status: INITIAL   2. Pt will improve gait velocity to at least 3.0 ft/sec with cane or no AD for improved gait efficiency and  performance at Supervision level  Baseline: 2.38 ft/sec with RW and CGA Goal status: INITIAL   3.  Pt will improve 5 x STS to less than or equal to 15 seconds without UE support to demonstrate improved functional strength and transfer efficiency.  Baseline: 18.63 with BUE support  Goal status: INITIAL   4.  Pt will improve normal TUG to less than or equal to 18 seconds without AD for improved functional mobility and decreased fall risk. Baseline: 15.97 sec (12/8) Goal status: INITIAL   5. Pt will ambulate 550' w/LRAD and mod I on 2MWT for improved endurance and efficiency with gait Baseline: 460' w/RW and CGA Goal status: REVISED  6. Pt will improve BERG balance score 8 points from baseline in order to indicate dec fall risk.   Baseline: 33/56  Goal Status: INITIAL  7.  Pt will  negotiate up/down 4 steps with single rail/cane, up/down ramp and curb and ambulate x 500' outdoors over unlevel paved surfaces at S level with cane or less in order to indicate improved community mobility.   Baseline:    Goal Status: INITIAL      ASSESSMENT:  CLINICAL IMPRESSION: Skilled session focused on high level gait outdoors over varying surfaces, high level balance on grassy surfaces and challenging reactionary balance in clinic.  Pt demos very good stepping and crossover stepping during session only needing min/guard to light min A throughout.  Discussed POC and that we may not need to go all the way through March, will see how he is doing next week at LTG check.    OBJECTIVE IMPAIRMENTS: Abnormal gait, cardiopulmonary status limiting activity, decreased activity tolerance, decreased balance, decreased endurance, decreased knowledge of use of DME, decreased mobility, difficulty walking, decreased strength, impaired perceived functional ability, impaired sensation, impaired UE functional use, improper body mechanics, and postural dysfunction.   ACTIVITY LIMITATIONS: carrying, lifting, bending, standing, squatting, stairs, transfers, bathing, reach over head, and locomotion level  PARTICIPATION LIMITATIONS: meal prep, cleaning, laundry, driving, shopping, community activity, and yard work  PERSONAL FACTORS: Past/current experiences and 3+ comorbidities: see above  are also affecting patient's functional outcome.   REHAB POTENTIAL: Excellent  CLINICAL DECISION MAKING: Evolving/moderate complexity  EVALUATION COMPLEXITY: Moderate  PLAN:  PT FREQUENCY: 3x/week  PT DURATION: 8 weeks  PLANNED INTERVENTIONS: Therapeutic exercises, Therapeutic activity, Neuromuscular re-education, Balance training, Gait training, Patient/Family education, Self Care, Stair training, Vestibular training, DME instructions, and Aquatic Therapy  PLAN FOR NEXT SESSION:  How is HEP? dynamic standing  balance with small ranges initially with focus on control, compliant surfaces, gait with and without cane with obstacles and turns, activities without AD as able.  Ankle strategy, narrowed BOS/tandem. Staggered stance DL, KB swings, Bosu ball tasks, boxing   Cameron Sprang, PT, MPT [facility] [address] [place], [place], [address] Phone: [phone]   Fax:  [phone] 01/02/23, 12:06 PM

## 2023-01-04 ENCOUNTER — Ambulatory Visit: Payer: Medicare PPO | Admitting: Physical Therapy

## 2023-01-04 ENCOUNTER — Encounter: Payer: Medicare PPO | Admitting: Occupational Therapy

## 2023-01-04 DIAGNOSIS — M6281 Muscle weakness (generalized): Secondary | ICD-10-CM

## 2023-01-04 DIAGNOSIS — R2681 Unsteadiness on feet: Secondary | ICD-10-CM | POA: Diagnosis not present

## 2023-01-04 DIAGNOSIS — R208 Other disturbances of skin sensation: Secondary | ICD-10-CM | POA: Diagnosis not present

## 2023-01-04 DIAGNOSIS — R278 Other lack of coordination: Secondary | ICD-10-CM | POA: Diagnosis not present

## 2023-01-04 DIAGNOSIS — G610 Guillain-Barre syndrome: Secondary | ICD-10-CM | POA: Diagnosis not present

## 2023-01-04 DIAGNOSIS — R2689 Other abnormalities of gait and mobility: Secondary | ICD-10-CM | POA: Diagnosis not present

## 2023-01-04 NOTE — Therapy (Signed)
OUTPATIENT PHYSICAL THERAPY NEURO THERAPY    Patient Name: Joe Greene MRN: [id number] DOB:03-17-1951, 72 y.o., male Today's Date: 01/04/2023   PCP: Arlester Marker, MD REFERRING PROVIDER: Marlowe Shores, PA-C  END OF SESSION:  PT End of Session - 01/04/23 1017     Visit Number 19    Number of Visits 25    Date for PT Re-Evaluation 01/13/23    Authorization Type [employer] [place]    Progress Note Due on Visit 10    PT Start Time 1016    PT Stop Time 1057    PT Time Calculation (min) 41 min    Equipment Utilized During Treatment Gait belt    Activity Tolerance Patient tolerated treatment well    Behavior During Therapy WFL for tasks assessed/performed                      Past Medical History:  Diagnosis Date   AAA (abdominal aortic aneurysm) ([place])    Cataract    forming    Guillain Barr syndrome ([place])    Past Surgical History:  Procedure Laterality Date   FOOT SURGERY Right    HERNIA REPAIR Right    IR FLUORO GUIDE CV LINE RIGHT  10/25/2022   IR US GUIDE VASC ACCESS RIGHT  10/25/2022   KNEE ARTHROSCOPY Left    WISDOM TOOTH EXTRACTION     Patient Active Problem List   Diagnosis Date Noted   Lumbar radiculopathy 01/01/2023   Left-sided low back pain with left-sided sciatica 12/11/2022   CIDP (chronic inflammatory demyelinating polyneuropathy) ([place]) 12/11/2022   Constipation 11/15/2022   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[place]) 11/02/2022   Acute sensory neuropathy 10/25/2022   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[place]) 10/18/2022   B12 deficiency 10/18/2022   Gait abnormality 10/18/2022   Pre-diabetes 09/25/2022   AAA (abdominal aortic aneurysm) ([place]) 09/24/2022   Essential hypertension 09/24/2022   Iron deficiency 04/04/2022   Hypertriglyceridemia 04/03/2022   Coronary artery calcification 01/18/2022   Candidal intertrigo    Emphysema of lung ([place]) 07/26/2021   Aortic atherosclerosis ([place]) 07/26/2021   Internal hemorrhoids 01/23/2021    Diverticula of colon 01/23/2021   Eczema 01/23/2021   Actinic keratoses 01/23/2021   Tobacco use 09/02/2020    ONSET DATE: 11/06/2022 (referral date)  REFERRING DIAG: AIDP, GBS  THERAPY DIAG:  Unsteadiness on feet  Muscle weakness (generalized)  Rationale for Evaluation and Treatment: Rehabilitation  SUBJECTIVE:  SUBJECTIVE STATEMENT: Reports feeling "fine". No pain today, "I am about to throw this cane away". HEP is going well    Pt accompanied by: self and wife in lobby   PERTINENT HISTORY: Patient is in today for follow-up from his recent hospitalization. Mr. Ohler was admitted at [facility] from 11/13-11/20/2023. He presented with right leg weakness and ascending numbness of his fingers and toes. He had extensive work up. 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 He was treated with a course of IVIG. He underwent some initial physical therapy. Due to some logistical challenges, the decision was made to discharge him to home, rather than move to inpatient rehab.   Since returning home, Mr. Proffer notes he had fluctuating weakness in the legs. Therefore returned to MD and was hospitalized with new diagnosis of AIDP on 11/02/22, went to inpatient rehab and then returned home 11/10/22.   Other issues were identified during his hospital stay, including some hyperglycemia with an A1c of 6.5%, a mildly high TSH with a normal T4, elevated blood pressures, and some dyspnea issues. His blood pressure was high enough that he was started on losartan 25 mg daily. He has been trying to quit smoking now in light of his current issues. Additionally, the hospital noted his infrarenal AAA.  PAIN:  Are you having pain? Does not rate, but does report increased tingling in B feet, some pain in  BLEs  PRECAUTIONS: Fall  WEIGHT BEARING RESTRICTIONS: No  FALLS: Has patient fallen in last 6 months? No  LIVING ENVIRONMENT: Lives with: lives with their family Lives in: House/apartment Stairs: No Has following equipment at home: Environmental consultant - 2 wheeled, shower chair, and Grab bars  PLOF: Requires assistive device for independence, Needs assistance with ADLs, and Needs assistance with homemaking  PATIENT GOALS: "I want to get back to normal."   OBJECTIVE:   DIAGNOSTIC FINDINGS: Most recent nerve testing revealed AIDP  COGNITION: Overall cognitive status: Within functional limits for tasks assessed   SENSATION: Light touch: Impaired B numbness and tingling up to thighs and also in B hands  COORDINATION: WFL heel to shin    POSTURE: rounded shoulders and forward head Has to sit off of L hip at times due to pain    FUNCTIONAL TESTS: From eval  5 times sit to stand: 18.63 secs with B UE support  Timed up and go (TUG): 22.63 secs without AD with min A 10 meter walk test: 2.38 ft/sec with RW Berg Balance Scale: 33/56 (down from 42/56 when left CIR)    TODAY'S TREATMENT:               Ther Ex  SciFit multi-peaks level 9 for 8 minutes using BUE/BLEs for neural priming for reciprocal movement, dynamic cardiovascular warmup and global strength. RPE of 9/10 following activity.   NMR  Farmer's carries w/20# KB, x115' per side in fwd direction and 115' per side in retro direction, CGA throughout, for improved core stability, posterior chain strength and dynamic balance. Pt had increased difficulty performing when holding KB on L side, w/lateral path deviations noted and occasional cross-stepping, requiring single instance of min A for LOB correction. Pt more cautious in posterior direction and had difficulty managing foot placement w/turns due to ER of BLEs (LLE >RLE) Side stepping drill using 8 cones, performed over 20' (first round) and 30' (2nd round) for improved LE coordination,  BLE strength and hip strength. CGA throughout for safety and min cues required to maintain side  stepping, as pt tended to rotate and step at an angle. Pt very challenged by this due to fatigue and poor stamina, requiring lengthy seated rest break between rounds. RPE of 8-9/10 following session.       PATIENT EDUCATION: Education details: Continue HEP Person educated: Patient Education method: Merchandiser, retail cues Education comprehension: verbalized understanding and needs further education  HOME EXERCISE PROGRAM: Access Code: [email or web address] URL: [email or web address] Date: 11/21/2022 Prepared by: Cameron Sprang  Exercises - Standing March with Counter Support  - 1-2 x daily - 5 x weekly - 1-2 sets - 5-10 reps - 3 hold - Standing Thoracic and Cervical Rotation with Reach at Wall  - 1-2 x daily - 5 x weekly - 1-2 sets - 3-5 reps - 5 hold - Mini Squat with Counter Support  - 1-2 x daily - 5 x weekly - 1-2 sets - 5-10 reps - 3 hold - Standing Hip Abduction with Counter Support  - 1 x daily - 7 x weekly - 1-2 sets - 10 reps - Standing Hip Extension with Counter Support  - 1 x daily - 7 x weekly - 1-2 sets - 10 reps - Supine Figure 4 Piriformis Stretch  - 2-3 x daily - 7 x weekly - 1 sets - 2-3 reps - 30 secs hold - Seated Piriformis Stretch with Trunk Bend  - 1 x daily - 7 x weekly - 3-4 reps - 30-60 second hold - Seated Diaphragmatic Breathing  - 1 x daily - 7 x weekly - 3 sets - 10 reps - 3-5 second hold  GOALS: Goals reviewed with patient? Yes  SHORT TERM GOALS: Target date: 12/15/22  Pt will be independent with initial HEP for improved strength, balance, transfers and gait. Baseline: Goal status: MET   2.  Pt will improve gait velocity to at least 2.75 ft/sec for improved gait efficiency and performance at S level  Baseline: 2.38 ft/sec with CGA and RW (1/3); 2.73 ft/s with RW and S*  Goal status: MET   3.  Pt will ambulate 500' w/LRAD and S* on 2MWT for improved  endurance and efficiency with gait Baseline: 460' w/RW and CGA; 435' w/RW mod I Goal status: NOT MET    4. Pt will improve BERG balance score 4 points from baseline in order to indicate dec fall risk.   Baseline: 33/56; 36/56 on 2/2   Goal Status: PARTIALLY MET       LONG TERM GOALS: Target date: 01/13/2023   Pt will be independent with final HEP for improved strength, balance, transfers and gait. Baseline:  Goal status: INITIAL   2. Pt will improve gait velocity to at least 3.0 ft/sec with cane or no AD for improved gait efficiency and performance at Supervision level  Baseline: 2.38 ft/sec with RW and CGA Goal status: INITIAL   3.  Pt will improve 5 x STS to less than or equal to 15 seconds without UE support to demonstrate improved functional strength and transfer efficiency.  Baseline: 18.63 with BUE support  Goal status: INITIAL   4.  Pt will improve normal TUG to less than or equal to 18 seconds without AD for improved functional mobility and decreased fall risk. Baseline: 15.97 sec (12/8) Goal status: INITIAL   5. Pt will ambulate 550' w/LRAD and mod I on 2MWT for improved endurance and efficiency with gait Baseline: 460' w/RW and CGA Goal status: REVISED  6. Pt will improve BERG balance score 8 points from baseline in order  to indicate dec fall risk.   Baseline: 33/56  Goal Status: INITIAL  7.  Pt will negotiate up/down 4 steps with single rail/cane, up/down ramp and curb and ambulate x 500' outdoors over unlevel paved surfaces at S level with cane or less in order to indicate improved community mobility.   Baseline:    Goal Status: INITIAL      ASSESSMENT:  CLINICAL IMPRESSION: Emphasis of skilled PT session on improved endurance, BLE strength and core stability. Pt fatigued very quickly throughout session but performed interventions well. No use of cane throughout session. Briefly discussed potential to DC from PT w/next goal check and pt hesitant, stating he  "does not know what to expect". At this time, pt's poor stamina is most limiting factor. Pt reported he did weed eat his yard yesterday with no issues, just fatigued quickly. Will plan to assess balance next session to determine next steps in PT. Continue POC.    OBJECTIVE IMPAIRMENTS: Abnormal gait, cardiopulmonary status limiting activity, decreased activity tolerance, decreased balance, decreased endurance, decreased knowledge of use of DME, decreased mobility, difficulty walking, decreased strength, impaired perceived functional ability, impaired sensation, impaired UE functional use, improper body mechanics, and postural dysfunction.   ACTIVITY LIMITATIONS: carrying, lifting, bending, standing, squatting, stairs, transfers, bathing, reach over head, and locomotion level  PARTICIPATION LIMITATIONS: meal prep, cleaning, laundry, driving, shopping, community activity, and yard work  PERSONAL FACTORS: Past/current experiences and 3+ comorbidities: see above  are also affecting patient's functional outcome.   REHAB POTENTIAL: Excellent  CLINICAL DECISION MAKING: Evolving/moderate complexity  EVALUATION COMPLEXITY: Moderate  PLAN:  PT FREQUENCY: 3x/week  PT DURATION: 8 weeks  PLANNED INTERVENTIONS: Therapeutic exercises, Therapeutic activity, Neuromuscular re-education, Balance training, Gait training, Patient/Family education, Self Care, Stair training, Vestibular training, DME instructions, and Aquatic Therapy  PLAN FOR NEXT SESSION:  Assess balance. How is HEP? dynamic standing balance with small ranges initially with focus on control, compliant surfaces, gait with and without cane with obstacles and turns, activities without AD as able.  Ankle strategy, narrowed BOS/tandem. Staggered stance DL, KB swings, Bosu ball tasks, boxing   Charlett Nose, PT, [place] [address] [place], [place]  [address] Phone:  [phone] Fax:   [phone] 01/04/23, 11:00 AM

## 2023-01-07 ENCOUNTER — Ambulatory Visit: Payer: Medicare PPO | Admitting: Physical Therapy

## 2023-01-07 DIAGNOSIS — R208 Other disturbances of skin sensation: Secondary | ICD-10-CM | POA: Diagnosis not present

## 2023-01-07 DIAGNOSIS — M6281 Muscle weakness (generalized): Secondary | ICD-10-CM | POA: Diagnosis not present

## 2023-01-07 DIAGNOSIS — R2681 Unsteadiness on feet: Secondary | ICD-10-CM | POA: Diagnosis not present

## 2023-01-07 DIAGNOSIS — R2689 Other abnormalities of gait and mobility: Secondary | ICD-10-CM

## 2023-01-07 DIAGNOSIS — R278 Other lack of coordination: Secondary | ICD-10-CM | POA: Diagnosis not present

## 2023-01-07 DIAGNOSIS — G610 Guillain-Barre syndrome: Secondary | ICD-10-CM | POA: Diagnosis not present

## 2023-01-07 NOTE — Therapy (Signed)
OUTPATIENT PHYSICAL THERAPY NEURO THERAPY - 20TH VISIT PROGRESS NOTE   Patient Name: Joe Greene MRN: [id number] DOB:September 16, 1951, 72 y.o., male Today's Date: 01/07/2023   PCP: Arlester Marker, MD REFERRING PROVIDER: Marlowe Shores, PA-C  Physical Therapy Progress Note   Dates of Reporting Period:10/19/22 - 01/07/23  See Note below for Objective Data and Assessment of Progress/Goals.    END OF SESSION:  PT End of Session - 01/07/23 0933     Visit Number 20    Number of Visits 25    Date for PT Re-Evaluation 01/13/23    Authorization Type [employer]    Progress Note Due on Visit 10    PT Start Time 0932    PT Stop Time 1023    PT Time Calculation (min) 51 min    Equipment Utilized During Treatment Gait belt    Activity Tolerance Patient tolerated treatment well    Behavior During Therapy WFL for tasks assessed/performed                      Past Medical History:  Diagnosis Date   AAA (abdominal aortic aneurysm) ([place])    Cataract    forming    Guillain Barr syndrome ([place])    Past Surgical History:  Procedure Laterality Date   FOOT SURGERY Right    HERNIA REPAIR Right    IR FLUORO GUIDE CV LINE RIGHT  10/25/2022   IR US GUIDE VASC ACCESS RIGHT  10/25/2022   KNEE ARTHROSCOPY Left    WISDOM TOOTH EXTRACTION     Patient Active Problem List   Diagnosis Date Noted   Lumbar radiculopathy 01/01/2023   Left-sided low back pain with left-sided sciatica 12/11/2022   CIDP (chronic inflammatory demyelinating polyneuropathy) ([place]) 12/11/2022   Constipation 11/15/2022   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[place]) 11/02/2022   Acute sensory neuropathy 10/25/2022   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[place]) 10/18/2022   B12 deficiency 10/18/2022   Gait abnormality 10/18/2022   Pre-diabetes 09/25/2022   AAA (abdominal aortic aneurysm) ([place]) 09/24/2022   Essential hypertension 09/24/2022   Iron deficiency 04/04/2022   Hypertriglyceridemia 04/03/2022    Coronary artery calcification 01/18/2022   Candidal intertrigo    Emphysema of lung ([place]) 07/26/2021   Aortic atherosclerosis ([place]) 07/26/2021   Internal hemorrhoids 01/23/2021   Diverticula of colon 01/23/2021   Eczema 01/23/2021   Actinic keratoses 01/23/2021   Tobacco use 09/02/2020    ONSET DATE: 11/06/2022 (referral date)  REFERRING DIAG: AIDP, GBS  THERAPY DIAG:  Unsteadiness on feet  Other abnormalities of gait and mobility  Rationale for Evaluation and Treatment: Rehabilitation  SUBJECTIVE:  SUBJECTIVE STATEMENT: Pt ambulated into clinic holding cane. Reports feeling good today, did yard work this weekend and other than fatigue, felt good. No falls.   Pt accompanied by: self and wife in lobby   PERTINENT HISTORY: Patient is in today for follow-up from his recent hospitalization. Joe Greene was admitted at [facility] from 11/13-11/20/2023. He presented with right leg weakness and ascending numbness of his fingers and toes. He had extensive work up. 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 He was treated with a course of IVIG. He underwent some initial physical therapy. Due to some logistical challenges, the decision was made to discharge him to home, rather than move to inpatient rehab.   Since returning home, Joe Greene notes he had fluctuating weakness in the legs. Therefore returned to MD and was hospitalized with new diagnosis of AIDP on 11/02/22, went to inpatient rehab and then returned home 11/10/22.   Other issues were identified during his hospital stay, including some hyperglycemia with an A1c of 6.5%, a mildly high TSH with a normal T4, elevated blood pressures, and some dyspnea issues. His blood pressure was high enough that he was started on losartan 25 mg  daily. He has been trying to quit smoking now in light of his current issues. Additionally, the hospital noted his infrarenal AAA.  PAIN:  Are you having pain? Does not rate, but does report increased tingling in B feet, some pain in BLEs  PRECAUTIONS: Fall  WEIGHT BEARING RESTRICTIONS: No  FALLS: Has patient fallen in last 6 months? No  LIVING ENVIRONMENT: Lives with: lives with their family Lives in: House/apartment Stairs: No Has following equipment at home: Environmental consultant - 2 wheeled, shower chair, and Grab bars  PLOF: Requires assistive device for independence, Needs assistance with ADLs, and Needs assistance with homemaking  PATIENT GOALS: "I want to get back to normal."   OBJECTIVE:   DIAGNOSTIC FINDINGS: Most recent nerve testing revealed AIDP  COGNITION: Overall cognitive status: Within functional limits for tasks assessed   SENSATION: Light touch: Impaired B numbness and tingling up to thighs and also in B hands  COORDINATION: WFL heel to shin    POSTURE: rounded shoulders and forward head Has to sit off of L hip at times due to pain    FUNCTIONAL TESTS: From eval  5 times sit to stand: 18.63 secs with B UE support  Timed up and go (TUG): 22.63 secs without AD with min A 10 meter walk test: 2.38 ft/sec with RW Berg Balance Scale: 33/56 (down from 42/56 when left CIR)    TODAY'S TREATMENT:               Ther Act    [facility] PT Assessment - 01/07/23 0935       Berg Balance Test   Sit to Stand Able to stand without using hands and stabilize independently    Standing Unsupported Able to stand safely 2 minutes    Sitting with Back Unsupported but Feet Supported on Floor or Stool Able to sit safely and securely 2 minutes    Stand to Sit Sits safely with minimal use of hands    Transfers Able to transfer safely, minor use of hands    Standing Unsupported with Eyes Closed Able to stand 10 seconds safely    Standing Unsupported with Feet Together Able to place feet  together independently and stand 1 minute safely    From Standing, Reach Forward with Outstretched Arm Can reach confidently >25 cm (  10")   15"   From Standing Position, Pick up Object from [place] to pick up shoe safely and easily    From Standing Position, Turn to Look Behind Over each Shoulder Looks behind from both sides and weight shifts well    Turn 360 Degrees Able to turn 360 degrees safely in 4 seconds or less   2.29s to L, 2.62s to R   Standing Unsupported, Alternately Place Feet on Step/Stool Able to stand independently and safely and complete 8 steps in 20 seconds   8.18s   Standing Unsupported, One Foot in Front Able to plae foot ahead of the other independently and hold 30 seconds    Standing on One Leg Tries to lift leg/unable to hold 3 seconds but remains standing independently   1.85s on RLE, 1.6s on LLE   Total Score 52    Berg comment: Low fall risk      Functional Gait  Assessment   Gait assessed  Yes    Gait Level Surface Walks 20 ft in less than 7 sec but greater than 5.5 sec, uses assistive device, slower speed, mild gait deviations, or deviates 6-10 in outside of the 12 in walkway width.   5.84s   Change in Gait Speed Able to smoothly change walking speed without loss of balance or gait deviation. Deviate no more than 6 in outside of the 12 in walkway width.    Gait with Horizontal Head Turns Performs head turns smoothly with slight change in gait velocity (eg, minor disruption to smooth gait path), deviates 6-10 in outside 12 in walkway width, or uses an assistive device.    Gait with Vertical Head Turns Performs head turns with no change in gait. Deviates no more than 6 in outside 12 in walkway width.    Gait and Pivot Turn Pivot turns safely within 3 sec and stops quickly with no loss of balance.    Step Over Obstacle Is able to step over 2 stacked shoe boxes taped together (9 in total height) without changing gait speed. No evidence of imbalance.    Gait with Narrow  Base of Support Ambulates 7-9 steps.    Gait with Eyes Closed Walks 20 ft, uses assistive device, slower speed, mild gait deviations, deviates 6-10 in outside 12 in walkway width. Ambulates 20 ft in less than 9 sec but greater than 7 sec.   8.03s   Ambulating Backwards Walks 20 ft, uses assistive device, slower speed, mild gait deviations, deviates 6-10 in outside 12 in walkway width.   10.46s   Steps Alternating feet, no rail.    Total Score 25    FGA comment: Low fall risk            Ther Ex  Updated HEP (see bolded below) to make exercises more challenging for pt and work on cardiovascular endurance. Instructed pt to perform standing hip in/outs w/UE support and demo'd in clinic. Pt w/difficulty coordinating movement initially but declined handout of activity.   Self-care/home management  Discussed goal outcomes and POC moving forward. Discussed potential to transition to gym environment to work on cardiovascular endurance and global strength. Pt to think about it.    Gait pattern: step through pattern, decreased trunk rotation, and wide BOS Distance walked: Various clinic distances  Assistive device utilized: Single point cane and None Level of assistance: Modified independence Comments: Pt ambulated into clinic holding cane but did not use cane throughout session    PATIENT EDUCATION: Education details:  Updated HEP, goal outcomes  Person educated: Patient Education method: Explanation and Verbal cues Education comprehension: verbalized understanding and needs further education  HOME EXERCISE PROGRAM: Access Code: [email or web address] URL: [email or web address] Date: 01/07/2023 Prepared by: Mickie Bail Alton Tremblay  Exercises - Supine Figure 4 Piriformis Stretch  - 2-3 x daily - 7 x weekly - 1 sets - 2-3 reps - 30 secs hold - Seated Piriformis Stretch with Trunk Bend  - 1 x daily - 7 x weekly - 3-4 reps - 30-60 second hold - Wall Quarter Squat  - 1 x daily - 7 x weekly - 3-4 reps  - 10-20 second hold Verbally added standing hip in/outs w/single UE support  GOALS: Goals reviewed with patient? Yes  SHORT TERM GOALS: Target date: 12/15/22  Pt will be independent with initial HEP for improved strength, balance, transfers and gait. Baseline: Goal status: MET   2.  Pt will improve gait velocity to at least 2.75 ft/sec for improved gait efficiency and performance at S level  Baseline: 2.38 ft/sec with CGA and RW (1/3); 2.73 ft/s with RW and S*  Goal status: MET   3.  Pt will ambulate 500' w/LRAD and S* on 2MWT for improved endurance and efficiency with gait Baseline: 460' w/RW and CGA; 435' w/RW mod I Goal status: NOT MET    4. Pt will improve BERG balance score 4 points from baseline in order to indicate dec fall risk.   Baseline: 33/56; 36/56 on 2/2   Goal Status: PARTIALLY MET       LONG TERM GOALS: Target date: 01/13/2023   Pt will be independent with final HEP for improved strength, balance, transfers and gait. Baseline:  Goal status: INITIAL   2. Pt will improve gait velocity to at least 3.0 ft/sec with cane or no AD for improved gait efficiency and performance at Supervision level  Baseline: 2.38 ft/sec with RW and CGA Goal status: INITIAL   3.  Pt will improve 5 x STS to less than or equal to 15 seconds without UE support to demonstrate improved functional strength and transfer efficiency.  Baseline: 18.63 with BUE support  Goal status: INITIAL   4.  Pt will improve normal TUG to less than or equal to 18 seconds without AD for improved functional mobility and decreased fall risk. Baseline: 15.97 sec (12/8) Goal status: INITIAL   5. Pt will ambulate 550' w/LRAD and mod I on 2MWT for improved endurance and efficiency with gait Baseline: 460' w/RW and CGA Goal status: REVISED  6. Pt will improve BERG balance score 8 points from baseline in order to indicate dec fall risk.   Baseline: 33/56; 52/56 on 01/07/23   Goal Status: MET   7.  Pt will  negotiate up/down 4 steps with single rail/cane, up/down ramp and curb and ambulate x 500' outdoors over unlevel paved surfaces at S level with cane or less in order to indicate improved community mobility.   Baseline:    Goal Status: INITIAL      ASSESSMENT:  CLINICAL IMPRESSION: Emphasis of skilled PT session on balance assessment and updating HEP. Pt scored a 52/56 on Berg and a 25/30 on FGA, indicative of low fall risk. Pt has met 1 of 7 LTGs so far due to improving his berg from 33 to 52. Pt is overall doing well and is rarely using SPC in community. Pt continues to be most limited by poor cardiovascular endurance but is able to perform laborious tasks, such as yard work, if taking  frequent short rest breaks. Continue POC.    OBJECTIVE IMPAIRMENTS: Abnormal gait, cardiopulmonary status limiting activity, decreased activity tolerance, decreased balance, decreased endurance, decreased knowledge of use of DME, decreased mobility, difficulty walking, decreased strength, impaired perceived functional ability, impaired sensation, impaired UE functional use, improper body mechanics, and postural dysfunction.   ACTIVITY LIMITATIONS: carrying, lifting, bending, standing, squatting, stairs, transfers, bathing, reach over head, and locomotion level  PARTICIPATION LIMITATIONS: meal prep, cleaning, laundry, driving, shopping, community activity, and yard work  PERSONAL FACTORS: Past/current experiences and 3+ comorbidities: see above  are also affecting patient's functional outcome.   REHAB POTENTIAL: Excellent  CLINICAL DECISION MAKING: Evolving/moderate complexity  EVALUATION COMPLEXITY: Moderate  PLAN:  PT FREQUENCY: 3x/week  PT DURATION: 8 weeks  PLANNED INTERVENTIONS: Therapeutic exercises, Therapeutic activity, Neuromuscular re-education, Balance training, Gait training, Patient/Family education, Self Care, Stair training, Vestibular training, DME instructions, and Aquatic  Therapy  PLAN FOR NEXT SESSION:  Discuss DC- pt interested in transitioning to gym. How is HEP? dynamic standing balance with small ranges initially with focus on control, compliant surfaces, gait with and without cane with obstacles and turns, activities without AD as able.  Ankle strategy, narrowed BOS/tandem. Staggered stance DL, KB swings, Bosu ball tasks, boxing   Charlett Nose, PT, [place] [address] [place], [place]  [address] Phone:  [phone] Fax:  [phone] 01/07/23, 11:22 AM

## 2023-01-08 ENCOUNTER — Other Ambulatory Visit: Payer: Self-pay | Admitting: Neurology

## 2023-01-09 ENCOUNTER — Encounter: Payer: Self-pay | Admitting: Rehabilitation

## 2023-01-09 ENCOUNTER — Ambulatory Visit: Payer: Medicare PPO | Admitting: Rehabilitation

## 2023-01-09 DIAGNOSIS — G610 Guillain-Barre syndrome: Secondary | ICD-10-CM | POA: Diagnosis not present

## 2023-01-09 DIAGNOSIS — R2681 Unsteadiness on feet: Secondary | ICD-10-CM

## 2023-01-09 DIAGNOSIS — M6281 Muscle weakness (generalized): Secondary | ICD-10-CM | POA: Diagnosis not present

## 2023-01-09 DIAGNOSIS — R208 Other disturbances of skin sensation: Secondary | ICD-10-CM

## 2023-01-09 DIAGNOSIS — R278 Other lack of coordination: Secondary | ICD-10-CM | POA: Diagnosis not present

## 2023-01-09 DIAGNOSIS — R2689 Other abnormalities of gait and mobility: Secondary | ICD-10-CM

## 2023-01-09 NOTE — Therapy (Signed)
OUTPATIENT PHYSICAL THERAPY NEURO [place] SUMMARY    Patient Name: Joe Greene MRN: [id number] DOB:[birth year], 72 y.o., male Today's Date: 01/09/2023   PCP: Arlester Marker, MD REFERRING PROVIDER: Marlowe Sh[place], PA-C     END OF SESSION:  PT End of Session - 01/09/23 1011     Visit Number 21    Number of Visits 25    Date for PT Re-Evaluation 01/13/23    Authorization Type [employer]    Progress Note Due on Visit 10    PT Start Time 1017    PT Stop Time 1100    PT Time Calculation (min) 43 min    Equipment Utilized During Treatment Gait belt    Activity Tolerance Patient tolerated treatment well    Behavior During Therapy WFL for tasks assessed/performed                      Past Medical History:  Diagnosis Date   AAA (abdominal aortic aneurysm) ([place])    Cataract    forming    Guillain Barr syndrome ([place])    Past Surgical History:  Procedure Laterality Date   FOOT SURGERY Right    HERNIA REPAIR Right    IR FLUORO GUIDE CV LINE RIGHT  10/25/2022   IR US GUIDE VASC ACCESS RIGHT  10/25/2022   KNEE ARTHROSCOPY Left    WISDOM TOOTH EXTRACTION     Patient Active Problem List   Diagnosis Date Noted   Lumbar radiculopathy 01/01/2023   Left-sided low back pain with left-sided sciatica 12/11/2022   CIDP (chronic inflammatory demyelinating polyneuropathy) ([place]) 12/11/2022   Constipation 11/15/2022   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[place]) 11/02/2022   Acute sensory neuropathy 10/25/2022   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[place]) 10/18/2022   B12 deficiency 10/18/2022   Gait abnormality 10/18/2022   Pre-diabetes 09/25/2022   AAA (abdominal aortic aneurysm) ([place]) 09/24/2022   Essential hypertension 09/24/2022   Iron deficiency 04/04/2022   Hypertriglyceridemia 04/03/2022   Coronary artery calcification 01/18/2022   Candidal intertrigo    Emphysema of lung ([place]) 07/26/2021   Aortic atherosclerosis ([place]) 07/26/2021   Internal  hemorrhoids 01/23/2021   Diverticula of colon 01/23/2021   Eczema 01/23/2021   Actinic keratoses 01/23/2021   Tobacco use 09/02/2020    ONSET DATE: 11/06/2022 (referral date)  REFERRING DIAG: AIDP, GBS  THERAPY DIAG:  Unsteadiness on feet  Other abnormalities of gait and mobility  Muscle weakness (generalized)  Other disturbances of skin sensation  Rationale for Evaluation and Treatment: Rehabilitation  SUBJECTIVE:  SUBJECTIVE STATEMENT: No changes, no falls.  Reports only mild pain, still intermittent in nature.  Reports he and wife checked out a gym near him and plan to start going there for exercise.   Pt accompanied by: self and wife in lobby   PERTINENT HISTORY: Patient is in today for follow-up from his recent hospitalization. Joe Greene was admitted at [facility] from 11/13-11/20/2023. He presented with right leg weakness and ascending numbness of his fingers and toes. He had extensive work up. 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 He was treated with a course of IVIG. He underwent some initial physical therapy. Due to some logistical challenges, the decision was made to discharge him to home, rather than move to inpatient rehab.   Since returning home, Joe Greene notes he had fluctuating weakness in the legs. Therefore returned to [place] and was hospitalized with new diagnosis of AIDP on 11/02/22, went to inpatient rehab and then returned home 11/10/22.   Other issues were identified during his hospital stay, including some hyperglycemia with an A1c of 6.5%, a mildly high TSH with a normal T4, elevated blood pressures, and some dyspnea issues. His blood pressure was high enough that he was started on losartan 25 mg daily. He has been trying to quit smoking now in light of  his current issues. Additionally, the hospital noted his infrarenal AAA.  PAIN:  Are you having pain? Does not rate, but does report increased tingling in B feet, some pain in BLEs  PRECAUTIONS: Fall  WEIGHT BEARING RESTRICTIONS: No  FALLS: Has patient fallen in last 6 months? No  LIVING ENVIRONMENT: Lives with: lives with their family Lives in: House/apartment Stairs: No Has following equipment at home: Environmental consultant - 2 wheeled, shower chair, and Grab bars  PLOF: Requires assistive device for independence, Needs assistance with ADLs, and Needs assistance with homemaking  PATIENT GOALS: "I want to get back to normal."   OBJECTIVE:   DIAGNOSTIC FINDINGS: Most recent nerve testing revealed AIDP  COGNITION: Overall cognitive status: Within functional limits for tasks assessed   SENSATION: Light touch: Impaired B numbness and tingling up to thighs and also in B hands  COORDINATION: WFL heel to shin    POSTURE: rounded shoulders and forward head Has to sit off of L hip at times due to pain    FUNCTIONAL TESTS: From eval  5 times sit to stand: 18.63 secs with B UE support  Timed up and go (TUG): 22.63 secs without AD with min A 10 meter walk test: 2.38 ft/sec with RW Berg Balance Scale: 33/56 (down from 42/56 when left CIR)    TODAY'S TREATMENT:               2MWT: vitals pretest SaO2 97%, HR 59 bpm, BP 121/78, 0 DOE, 0 RPE  Distance walked: 466' without device  Post test vitals  BP 131/65, SaO2 96%, HR 57, RPE 3/10, 1/10 DOE  RAMP:  Level of Assistance: Modified independence Assistive device utilized: None Ramp Comments: no difficulties  CURB:  Level of Assistance: Modified independence Assistive device utilized: None Curb Comments: no difficulties  STAIRS:  Level of Assistance: Modified independence  Stair Negotiation Technique: Alternating Pattern  with No Rails  Number of Stairs: 4   Height of Stairs: 6  Comments: no difficulties   GAIT: Gait pattern:  step through pattern Distance walked: 500'+ during session  Assistive device utilized: None Level of assistance: Modified independence Comments: Mild L hip instability with gait but  no overt LOB or unsteadiness with gait today   TUG: 9 secs without device 5TSS: 10.34 secs without support   Reviewed and updated HEP for higher level balance challenges, see below for details.     PATIENT EDUCATION: Education details: Updated HEP, goal outcomes  Person educated: Patient Education method: Explanation and Verbal cues Education comprehension: verbalized understanding and needs further education  HOME EXERCISE PROGRAM: Access Code: [email or web address] URL: [email or web address] Date: 01/09/2023 Prepared by: [place] with Leg Extensions  - 1 x daily - 7 x weekly - 2 sets - 5 reps - Romberg Stance Eyes Closed on Foam Pad  - 1 x daily - 7 x weekly - 1 sets - 3 reps - 20 secs hold - Romberg Stance on Foam Pad with Head Rotation  - 1 x daily - 7 x weekly - 1 sets - 10 reps - Romberg Stance with Head Nods on Foam Pad  - 1 x daily - 7 x weekly - 1 sets - 10 reps - Tandem Walking with Counter Support  - 1 x daily - 7 x weekly - 1 sets - 4 reps - Tandem Stance  - 1 x daily - 7 x weekly - 1 sets - 3 reps - 20 secs  hold  GOALS: Goals reviewed with patient? Yes  SHORT TERM GOALS: Target date: 12/15/22  Pt will be independent with initial HEP for improved strength, balance, transfers and gait. Baseline: Goal status: MET   2.  Pt will improve gait velocity to at least 2.75 ft/sec for improved gait efficiency and performance at S level  Baseline: 2.38 ft/sec with CGA and RW (1/3); 2.73 ft/s with RW and S*  Goal status: MET   3.  Pt will ambulate 500' w/LRAD and S* on 2MWT for improved endurance and efficiency with gait Baseline: 460' w/RW and CGA; 435' w/RW mod I Goal status: NOT MET    4. Pt will improve BERG balance score 4 points from baseline in order to  indicate dec fall risk.   Baseline: 33/56; 36/56 on 2/2   Goal Status: PARTIALLY MET       LONG TERM GOALS: Target date: 01/13/2023   Pt will be independent with final HEP for improved strength, balance, transfers and gait. Baseline:  Goal status: MET    2. Pt will improve gait velocity to at least 3.0 ft/sec with cane or no AD for improved gait efficiency and performance at Supervision level  Baseline: 3.21f/sec without device  Goal status: MET    3.  Pt will improve 5 x STS to less than or equal to 15 seconds without UE support to demonstrate improved functional strength and transfer efficiency.  Baseline: 10.34 without UE support  Goal status: MET    4.  Pt will improve normal TUG to less than or equal to 18 seconds without AD for improved functional mobility and decreased fall risk. Baseline: 7.59 secs without device  Goal status: MET    5. Pt will ambulate 550' w/LRAD and mod I on 2MWT for improved endurance and efficiency with gait Baseline: 460' w/RW and CGA to 438 without device at mod I level  Goal status: PROGRESSING  6. Pt will improve BERG balance score 8 points from baseline in order to indicate dec fall risk.   Baseline: 33/56; 52/56 on 01/07/23   Goal Status: MET   7.  Pt will negotiate up/down 4 steps with single rail/cane, up/down ramp and curb and ambulate  x 500' outdoors over unlevel paved surfaces at S level with cane or less in order to indicate improved community mobility.   Baseline:    Goal Status: MET   PHYSICAL THERAPY DISCHARGE SUMMARY  Visits from Start of Care: 21  Current functional level related to goals / functional outcomes: See LTGs above   Remaining deficits: Pt with high level balance deficits and cardiovascular endurance deficits, however has HEP to address this and community fitness continued to be encouraged.    Education / Equipment: HEP    Patient agrees to discharge. Patient goals were met. Patient is being discharged due to  meeting the stated rehab goals.     ASSESSMENT:  CLINICAL IMPRESSION: Skilled session focused on further assessing remaining LTGs for D/C.  Pt has met all but 1 LTG for 2 MWT.  He did improve slightly in distance, but has made gains with decreasing amount of fatigue and doing so without a device.  He reports that he could have pushed himself harder at end of assessment.  PT feels that he is ready for D/c esp since he is planning to begin community fitness.  Pt in agreement with DC.     OBJECTIVE IMPAIRMENTS: Abnormal gait, cardiopulmonary status limiting activity, decreased activity tolerance, decreased balance, decreased endurance, decreased knowledge of use of DME, decreased mobility, difficulty walking, decreased strength, impaired perceived functional ability, impaired sensation, impaired UE functional use, improper body mechanics, and postural dysfunction.   ACTIVITY LIMITATIONS: carrying, lifting, bending, standing, squatting, stairs, transfers, bathing, reach over head, and locomotion level  PARTICIPATION LIMITATIONS: meal prep, cleaning, laundry, driving, shopping, community activity, and yard work  PERSONAL FACTORS: Past/current experiences and 3+ comorbidities: see above  are also affecting patient's functional outcome.   REHAB POTENTIAL: Excellent  CLINICAL DECISION MAKING: Evolving/moderate complexity  EVALUATION COMPLEXITY: Moderate  PLAN:  PT FREQUENCY: 3x/week  PT DURATION: 8 weeks  PLANNED INTERVENTIONS: Therapeutic exercises, Therapeutic activity, Neuromuscular re-education, Balance training, Gait training, Patient/Family education, Self Care, Stair training, Vestibular training, DME instructions, and Aquatic Therapy  PLAN FOR NEXT SESSION:  dc    Cameron Sprang, PT, MPT [facility] [address] [place], [place], [address] Phone: [phone]   Fax:  [phone] 01/09/23, 11:49 AM

## 2023-01-11 ENCOUNTER — Encounter: Payer: Medicare PPO | Admitting: Neurology

## 2023-01-14 ENCOUNTER — Encounter: Payer: Medicare PPO | Admitting: Occupational Therapy

## 2023-01-14 ENCOUNTER — Ambulatory Visit: Payer: Medicare PPO | Admitting: Physical Therapy

## 2023-01-16 ENCOUNTER — Ambulatory Visit: Payer: Medicare PPO | Admitting: Physical Therapy

## 2023-01-16 ENCOUNTER — Encounter: Payer: Medicare PPO | Admitting: Occupational Therapy

## 2023-01-18 ENCOUNTER — Ambulatory Visit
Admission: RE | Admit: 2023-01-18 | Discharge: 2023-01-18 | Disposition: A | Discharge status: Home / Self Care | Payer: Medicare PPO | Source: Ambulatory Visit | Attending: Family Medicine | Admitting: Family Medicine

## 2023-01-18 DIAGNOSIS — Z87891 Personal history of nicotine dependence: Secondary | ICD-10-CM

## 2023-01-18 DIAGNOSIS — F17210 Nicotine dependence, cigarettes, uncomplicated: Secondary | ICD-10-CM | POA: Diagnosis not present

## 2023-01-21 ENCOUNTER — Encounter: Payer: Medicare PPO | Admitting: Occupational Therapy

## 2023-01-21 DIAGNOSIS — G610 Guillain-Barre syndrome: Secondary | ICD-10-CM | POA: Diagnosis not present

## 2023-01-22 DIAGNOSIS — G610 Guillain-Barre syndrome: Secondary | ICD-10-CM | POA: Diagnosis not present

## 2023-01-23 ENCOUNTER — Encounter: Payer: Medicare PPO | Admitting: Occupational Therapy

## 2023-01-23 ENCOUNTER — Ambulatory Visit: Payer: Medicare PPO | Admitting: Physical Therapy

## 2023-01-28 ENCOUNTER — Encounter: Payer: Medicare PPO | Admitting: Occupational Therapy

## 2023-01-28 ENCOUNTER — Ambulatory Visit: Payer: Medicare PPO | Admitting: Physical Therapy

## 2023-01-30 ENCOUNTER — Ambulatory Visit: Payer: Medicare PPO | Admitting: Physical Therapy

## 2023-01-30 ENCOUNTER — Encounter: Payer: Medicare PPO | Admitting: Occupational Therapy

## 2023-02-04 ENCOUNTER — Ambulatory Visit: Payer: Medicare PPO | Admitting: Physical Therapy

## 2023-02-04 ENCOUNTER — Encounter: Payer: Medicare PPO | Admitting: Occupational Therapy

## 2023-02-06 ENCOUNTER — Ambulatory Visit: Payer: Medicare PPO | Admitting: Physical Therapy

## 2023-02-06 ENCOUNTER — Encounter: Payer: Medicare PPO | Admitting: Occupational Therapy

## 2023-02-11 ENCOUNTER — Ambulatory Visit: Payer: Medicare PPO | Admitting: Physical Therapy

## 2023-02-11 DIAGNOSIS — G610 Guillain-Barre syndrome: Secondary | ICD-10-CM | POA: Diagnosis not present

## 2023-02-12 DIAGNOSIS — G610 Guillain-Barre syndrome: Secondary | ICD-10-CM | POA: Diagnosis not present

## 2023-02-16 ENCOUNTER — Other Ambulatory Visit: Payer: Self-pay | Admitting: Family Medicine

## 2023-02-20 ENCOUNTER — Ambulatory Visit: Payer: Medicare PPO | Admitting: Family Medicine

## 2023-02-20 ENCOUNTER — Encounter: Payer: Self-pay | Admitting: Family Medicine

## 2023-02-20 VITALS — BP 132/70 | HR 63 | Temp 98.0°F | Ht 70.0 in | Wt 200.8 lb

## 2023-02-20 DIAGNOSIS — L309 Dermatitis, unspecified: Secondary | ICD-10-CM | POA: Insufficient documentation

## 2023-02-20 DIAGNOSIS — I2510 Atherosclerotic heart disease of native coronary artery without angina pectoris: Secondary | ICD-10-CM | POA: Diagnosis not present

## 2023-02-20 DIAGNOSIS — E781 Pure hyperglyceridemia: Secondary | ICD-10-CM | POA: Diagnosis not present

## 2023-02-20 DIAGNOSIS — R7303 Prediabetes: Secondary | ICD-10-CM

## 2023-02-20 DIAGNOSIS — R7989 Other specified abnormal findings of blood chemistry: Secondary | ICD-10-CM | POA: Insufficient documentation

## 2023-02-20 DIAGNOSIS — I10 Essential (primary) hypertension: Secondary | ICD-10-CM

## 2023-02-20 DIAGNOSIS — F17201 Nicotine dependence, unspecified, in remission: Secondary | ICD-10-CM

## 2023-02-20 DIAGNOSIS — I700 Atherosclerosis of aorta: Secondary | ICD-10-CM

## 2023-02-20 DIAGNOSIS — Z125 Encounter for screening for malignant neoplasm of prostate: Secondary | ICD-10-CM

## 2023-02-20 DIAGNOSIS — I2584 Coronary atherosclerosis due to calcified coronary lesion: Secondary | ICD-10-CM | POA: Diagnosis not present

## 2023-02-20 DIAGNOSIS — G610 Guillain-Barre syndrome: Secondary | ICD-10-CM | POA: Diagnosis not present

## 2023-02-20 DIAGNOSIS — I7143 Infrarenal abdominal aortic aneurysm, without rupture: Secondary | ICD-10-CM

## 2023-02-20 LAB — BASIC METABOLIC PANEL
BUN: 21 mg/dL (ref 6–23)
CO2: 25 mEq/L (ref 19–32)
Calcium: 9.5 mg/dL (ref 8.4–10.5)
Chloride: 100 mEq/L (ref 96–112)
Creatinine, Ser: 0.97 mg/dL (ref 0.40–1.50)
GFR: 78.16 mL/min (ref >60.00)
Glucose, Bld: 104 mg/dL — ABNORMAL HIGH (ref 70–99)
Potassium: 4.3 mEq/L (ref 3.5–5.1)
Sodium: 136 mEq/L (ref 135–145)

## 2023-02-20 LAB — LDL CHOLESTEROL, DIRECT: Direct LDL: 117 mg/dL

## 2023-02-20 LAB — LIPID PANEL
Cholesterol: 201 mg/dL — ABNORMAL HIGH (ref 0–200)
HDL: 33.8 mg/dL — ABNORMAL LOW (ref >39.00)
NonHDL: 167.6
Total CHOL/HDL Ratio: 6
Triglycerides: 295 mg/dL — ABNORMAL HIGH (ref 0.0–149.0)
VLDL: 59 mg/dL — ABNORMAL HIGH (ref 0.0–40.0)

## 2023-02-20 LAB — HEMOGLOBIN A1C: Hgb A1c MFr Bld: 5.1 % (ref 4.6–6.5)

## 2023-02-20 LAB — TSH: TSH: 4.86 u[IU]/mL (ref 0.35–5.50)

## 2023-02-20 LAB — PSA, MEDICARE: PSA: 0.94 ng/ml (ref 0.10–4.00)

## 2023-02-20 LAB — T4, FREE: Free T4: 0.69 ng/dL (ref 0.60–1.60)

## 2023-02-20 MED ORDER — ROSUVASTATIN CALCIUM 5 MG PO TABS: 5.0000 mg | ORAL_TABLET | Freq: Every day | ORAL | 3 refills | Status: DC

## 2023-02-20 MED ORDER — GABAPENTIN 300 MG PO CAPS: 300.0000 mg | ORAL_CAPSULE | Freq: Three times a day (TID) | ORAL | 11 refills | Status: DC

## 2023-02-20 MED ORDER — TRIAMCINOLONE ACETONIDE 0.1 % EX CREA: 1.0000 | TOPICAL_CREAM | Freq: Two times a day (BID) | CUTANEOUS | 0 refills | Status: DC

## 2023-02-20 NOTE — Progress Notes (Signed)
[name] [address] [place] [id number] Dept: [phone] Dept Fax: [phone]  Chronic Care Office Visit  Subjective:    Patient ID: Joe Greene, male    DOB: 1950/11/17, 72 y.o..   MRN: [id number]  Chief Complaint  Patient presents with   Medical Management of Chronic Issues    3 month f/u. Fasting today.      History of Present Illness:  Patient is in today for reassessment of chronic medical issues.  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 This condition started for him initially on Nov. 9th, following a URI about 2 weeks earlier. He underwent IVIG treatment from Nov. 14-18th. His symptoms were worsening in Dec., when he was admitted for plasma exchange. He had a repeat hospitalization in early Jan.,where he was treated with another round of IVIG. He is now on IVIG infusions every 3 weeks. He notes his strength is doing much better, though he is still not back to baseline. He is now able to ambulate on his own without an assistive device. [facility] on [facility]. Joe Greene is also treated with gabapentin 600 mg TID and duloxetine 60 mg daily for neuropathic  symptoms. He notes his tingling is improved. He is concerned that since starting gabapentin he has had areas of rash on the upper chest and back. This can be quite pruritic. He is using Benadryl to try and help with this.  He is not sure he needs all of this medication ongoing.  Joe Greene has a history of hypertension. He has stopped smoking and is not on medication currently for BP control.  Joe Greene has a history of an abdominal aortic aneurysm. He is due for annual evaluation of this. Other scans have shown aortic and coronary atherosclerosis/calcifications. He takes  a daily aspirin.  Past Medical History: Patient Active Problem List   Diagnosis Date Noted    Lumbar radiculopathy 01/01/2023   Left-sided low back pain with left-sided sciatica 12/11/2022   CIDP (chronic inflammatory demyelinating polyneuropathy) 12/11/2022   Constipation 11/15/2022   GBS (Guillain Barre syndrome) 11/02/2022   Acute sensory neuropathy 10/25/2022   AIDP (acute inflammatory demyelinating polyneuropathy) 10/18/2022   B12 deficiency 10/18/2022   Gait abnormality 10/18/2022   Pre-diabetes 09/25/2022   AAA (abdominal aortic aneurysm) 09/24/2022   Essential hypertension 09/24/2022   Iron deficiency 04/04/2022   Hypertriglyceridemia 04/03/2022   Coronary artery calcification 01/18/2022   Candidal intertrigo    Emphysema of lung 07/26/2021   Aortic atherosclerosis 07/26/2021   Internal hemorrhoids 01/23/2021   Diverticula of colon 01/23/2021   Eczema 01/23/2021   Actinic keratoses 01/23/2021   Tobacco use disorder, moderate, in early remission 09/02/2020   Past Surgical History:  Procedure Laterality Date   FOOT SURGERY Right    HERNIA REPAIR Right    IR FLUORO GUIDE CV LINE RIGHT  10/25/2022   IR US GUIDE VASC ACCESS RIGHT  10/25/2022   KNEE ARTHROSCOPY Left    WISDOM TOOTH EXTRACTION     Family History  Problem Relation Age of Onset   Stroke Mother 1290   AAA (abdominal aortic aneurysm) Mother    Cancer Father        Prostate   Colon cancer Neg Hx    Colon polyps Neg Hx    Esophageal cancer Neg Hx    Rectal cancer Neg Hx    Stomach cancer Neg Hx  Outpatient Medications Prior to Visit  Medication Sig Dispense Refill   acetaminophen (TYLENOL) 325 MG tablet Take 1-2 tablets (325-650 mg total) by mouth every 4 (four) hours as needed for mild pain.     aspirin EC 81 MG tablet Take 81 mg by mouth in the morning.     Cyanocobalamin (B-12 PO) Take 1 tablet by mouth daily with lunch.     DULoxetine (CYMBALTA) 60 MG capsule Take 1 capsule (60 mg total) by mouth daily. 90 capsule 3   ferrous sulfate 325 (65 FE) MG EC tablet Take 325 mg by mouth daily with  lunch.     Fluocinonide (LIDEX EX) Apply 1 application  topically daily as needed (rash).     Multiple Vitamins-Minerals (CENTRUM SILVER) tablet Take 1 tablet by mouth daily with lunch.     Omega-3 Fatty Acids (FISH OIL PO) Take 1 capsule by mouth daily with lunch. When able to remember     sildenafil (VIAGRA) 100 MG tablet TAKE 1/2 TO 1 TABLET(50 TO 100 MG) BY MOUTH DAILY AS NEEDED FOR ERECTILE DYSFUNCTION 5 tablet 3   DULoxetine (CYMBALTA) 30 MG capsule TAKE 1 CAPSULE(30 MG) BY MOUTH DAILY 30 capsule 4   gabapentin (NEURONTIN) 300 MG capsule Take 2 capsules (600 mg total) by mouth 3 (three) times daily. 180 capsule 11   ibuprofen (ADVIL) 200 MG tablet Take 400 mg by mouth daily as needed for headache or mild pain. (Patient not taking: Reported on 02/20/2023)     DOCUSATE SODIUM PO Take 1 capsule by mouth daily as needed (constipation).     nicotine (NICODERM CQ - DOSED IN MG/24 HOURS) 14 mg/24hr patch Place 1 patch (14 mg total) onto the skin daily. 28 patch 0   No facility-administered medications prior to visit.   No Known Allergies Objective:   Today's Vitals   02/20/23 0951  BP: 132/70  Pulse: 63  Temp: 98 F (36.7 C)  TempSrc: Temporal  SpO2: 96%  Weight: 200 lb 12.8 oz (91.1 kg)  Height: 5\' 10"  (1.778 m)   Body mass index is 28.81 kg/m.   General: Well developed, well nourished. No acute distress. HEENT: Normocephalic, non-traumatic. External ears normal. EAC and TMs normal bilaterally. PERRL, EOMI. Conjunctiva clear. Nose   clear without congestion or rhinorrhea. Mucous membranes moist. Oropharynx clear. Good dentition. Neck: Supple. No lymphadenopathy. No thyromegaly. Lungs: Clear to auscultation bilaterally. No wheezing, rales or rhonchi. CV: RRR without murmurs or rubs. Pulses 2+ bilaterally. Abdomen: Soft, non-tender. Bowel sounds positive, normal pitch and frequency. No hepatosplenomegaly. No rebound or guarding. Back: Straight. No CVA tenderness  bilaterally. Extremities: Full ROM. No joint swelling or tenderness. No edema noted. Skin: Warm and dry. No rashes. Neuro: CN II-XII intact. Normal sensation and DTR bilaterally. Psych: Alert and oriented. Normal mood and affect.  Health Maintenance Due  Topic Date Due   Hepatitis C Screening  Never done   LDCT for Lung Cancer Screening (01/18/2023) IMPRESSION: 1. Lung-RADS 2, benign appearance or behavior. Continue annual screening with low-dose chest CT without contrast in 12 months. 2. Cylindrical bronchiectasis. 3. Aortic atherosclerosis (ICD10-I70.0). Coronary artery calcification. 4. Enlarged pulmonic trunk, indicative of pulmonary arterial hypertension. 5.  Emphysema (ICD10-J43.9).    Assessment & Plan:   Problem List Items Addressed This Visit       Cardiovascular and Mediastinum   AAA (abdominal aortic aneurysm) (Chronic)    Has stopped smoking. Due for annual screening of AAA.      Relevant Medications  rosuvastatin (CRESTOR) 5 MG tablet   Other Relevant Orders   CT Abdomen Pelvis Wo Contrast   Aortic atherosclerosis    As above.      Relevant Medications   rosuvastatin (CRESTOR) 5 MG tablet   Other Relevant Orders   Lipid panel   Coronary artery calcification    Has been able to stop smoking. I recommend he continue on a daily aspirin. I would also recommend that we start a statin to lower CV risk.      Relevant Medications   rosuvastatin (CRESTOR) 5 MG tablet   Other Relevant Orders   Lipid panel   Essential hypertension    Blood pressure is in good control. Continue to monitor for now.      Relevant Medications   rosuvastatin (CRESTOR) 5 MG tablet   Other Relevant Orders   Basic metabolic panel     Nervous and Auditory   GBS (Guillain Barre syndrome) - Primary    Improving at this point. Joe Greene will continue to follow with Dr. Terrace Arabia (neurology), but will also be seen at [facility].      Relevant Medications   gabapentin (NEURONTIN) 300 MG capsule      Musculoskeletal and Integument   Dermatitis    The rash may be a side effect of gabapentin. I recommend he reduce his dose to 300 mg TID. He should discuss with neurology about eventually stopping the gabapentin and duloxetine. I will renew TAC cream.      Relevant Medications   triamcinolone cream (KENALOG) 0.1 %     Other   Pre-diabetes (Chronic)    I will reassess the glucose and A1c today.      Relevant Orders   Basic metabolic panel   Hemoglobin A1c   Tobacco use disorder, moderate, in early remission    Congratulated Joe Greene on quitting. This was important related to several co-morbidities that his smoking would contribute to.      Hypertriglyceridemia    I will repeat lipids. I will start rosuvastatin in light of ASCVD and smoking history.      Relevant Medications   rosuvastatin (CRESTOR) 5 MG tablet   Other Relevant Orders   Lipid panel   Elevated TSH    Suspect this may have been transient. I will repeat his TSH and T4.      Relevant Orders   TSH   T4, free   Other Visit Diagnoses     Screening for prostate cancer       Relevant Orders   PSA, Medicare       Return in about 3 months (around 05/22/2023) for Reassessment.   Loyola Mast, MD

## 2023-02-20 NOTE — Assessment & Plan Note (Signed)
The rash may be a side effect of gabapentin. I recommend he reduce his dose to 300 mg TID. He should discuss with neurology about eventually stopping the gabapentin and duloxetine. I will renew TAC cream.

## 2023-02-20 NOTE — Assessment & Plan Note (Signed)
Congratulated Mr. Harbeson on quitting. This was important related to several co-morbidities that his smoking would contribute to.

## 2023-02-20 NOTE — Assessment & Plan Note (Signed)
Suspect this may have been transient. I will repeat his TSH and T4.

## 2023-02-20 NOTE — Assessment & Plan Note (Signed)
As above.

## 2023-02-20 NOTE — Assessment & Plan Note (Signed)
Has been able to stop smoking. I recommend he continue on a daily aspirin. I would also recommend that we start a statin to lower CV risk.

## 2023-02-20 NOTE — Assessment & Plan Note (Signed)
Improving at this point. Joe Greene will continue to follow with Dr. Terrace Arabia (neurology), but will also be seen at [facility].

## 2023-02-20 NOTE — Assessment & Plan Note (Signed)
I will repeat lipids. I will start rosuvastatin in light of ASCVD and smoking history.

## 2023-02-20 NOTE — Assessment & Plan Note (Signed)
Blood pressure is in good control. Continue to monitor for now.

## 2023-02-20 NOTE — Assessment & Plan Note (Signed)
Has stopped smoking. Due for annual screening of AAA.

## 2023-02-20 NOTE — Assessment & Plan Note (Signed)
I will reassess the glucose and A1c today.

## 2023-03-01 ENCOUNTER — Other Ambulatory Visit: Payer: Self-pay | Admitting: *Deleted

## 2023-03-01 DIAGNOSIS — Z87891 Personal history of nicotine dependence: Secondary | ICD-10-CM

## 2023-03-01 DIAGNOSIS — F17210 Nicotine dependence, cigarettes, uncomplicated: Secondary | ICD-10-CM

## 2023-03-04 DIAGNOSIS — G610 Guillain-Barre syndrome: Secondary | ICD-10-CM | POA: Diagnosis not present

## 2023-03-05 DIAGNOSIS — G610 Guillain-Barre syndrome: Secondary | ICD-10-CM | POA: Diagnosis not present

## 2023-03-06 DIAGNOSIS — G6181 Chronic inflammatory demyelinating polyneuritis: Secondary | ICD-10-CM | POA: Diagnosis not present

## 2023-03-25 ENCOUNTER — Ambulatory Visit
Admission: RE | Admit: 2023-03-25 | Discharge: 2023-03-25 | Disposition: A | Discharge status: Home / Self Care | Payer: Medicare PPO | Source: Ambulatory Visit | Attending: Family Medicine | Admitting: Family Medicine

## 2023-03-25 DIAGNOSIS — I7143 Infrarenal abdominal aortic aneurysm, without rupture: Secondary | ICD-10-CM | POA: Diagnosis not present

## 2023-03-25 DIAGNOSIS — G610 Guillain-Barre syndrome: Secondary | ICD-10-CM | POA: Diagnosis not present

## 2023-03-26 DIAGNOSIS — G610 Guillain-Barre syndrome: Secondary | ICD-10-CM | POA: Diagnosis not present

## 2023-04-03 DIAGNOSIS — J439 Emphysema, unspecified: Secondary | ICD-10-CM | POA: Diagnosis not present

## 2023-04-03 DIAGNOSIS — H3530 Unspecified macular degeneration: Secondary | ICD-10-CM | POA: Diagnosis not present

## 2023-04-03 DIAGNOSIS — L309 Dermatitis, unspecified: Secondary | ICD-10-CM | POA: Diagnosis not present

## 2023-04-03 DIAGNOSIS — I2510 Atherosclerotic heart disease of native coronary artery without angina pectoris: Secondary | ICD-10-CM | POA: Diagnosis not present

## 2023-04-03 DIAGNOSIS — E785 Hyperlipidemia, unspecified: Secondary | ICD-10-CM | POA: Diagnosis not present

## 2023-04-03 DIAGNOSIS — K760 Fatty (change of) liver, not elsewhere classified: Secondary | ICD-10-CM | POA: Diagnosis not present

## 2023-04-03 DIAGNOSIS — M5430 Sciatica, unspecified side: Secondary | ICD-10-CM | POA: Diagnosis not present

## 2023-04-03 DIAGNOSIS — I10 Essential (primary) hypertension: Secondary | ICD-10-CM | POA: Diagnosis not present

## 2023-04-03 DIAGNOSIS — G629 Polyneuropathy, unspecified: Secondary | ICD-10-CM | POA: Diagnosis not present

## 2023-04-15 DIAGNOSIS — G610 Guillain-Barre syndrome: Secondary | ICD-10-CM | POA: Diagnosis not present

## 2023-04-16 DIAGNOSIS — G610 Guillain-Barre syndrome: Secondary | ICD-10-CM | POA: Diagnosis not present

## 2023-05-06 DIAGNOSIS — G610 Guillain-Barre syndrome: Secondary | ICD-10-CM | POA: Diagnosis not present

## 2023-05-07 DIAGNOSIS — G610 Guillain-Barre syndrome: Secondary | ICD-10-CM | POA: Diagnosis not present

## 2023-05-08 ENCOUNTER — Ambulatory Visit (INDEPENDENT_AMBULATORY_CARE_PROVIDER_SITE_OTHER)
Admission: RE | Admit: 2023-05-08 | Discharge: 2023-05-08 | Disposition: A | Discharge status: Home / Self Care | Payer: Medicare PPO | Source: Ambulatory Visit | Attending: Vascular Surgery | Admitting: Vascular Surgery

## 2023-05-08 ENCOUNTER — Ambulatory Visit (HOSPITAL_COMMUNITY)
Admission: RE | Admit: 2023-05-08 | Discharge: 2023-05-08 | Disposition: A | Discharge status: Home / Self Care | Payer: Medicare PPO | Source: Ambulatory Visit | Attending: Vascular Surgery | Admitting: Vascular Surgery

## 2023-05-08 ENCOUNTER — Ambulatory Visit: Payer: Medicare PPO | Admitting: Physician Assistant

## 2023-05-08 VITALS — BP 136/82 | HR 64 | Temp 97.9°F | Resp 16 | Ht 69.0 in | Wt 206.0 lb

## 2023-05-08 DIAGNOSIS — I7143 Infrarenal abdominal aortic aneurysm, without rupture: Secondary | ICD-10-CM

## 2023-05-08 DIAGNOSIS — I739 Peripheral vascular disease, unspecified: Secondary | ICD-10-CM | POA: Insufficient documentation

## 2023-05-08 NOTE — Progress Notes (Signed)
Office Note   History of Present Illness   Joe Greene is a 72 y.o. ([birth year]) male who presents for follow up of AAA.  His annual him was found incidentally in November 2023.  At that time the maximum measurement of his aorta was 4.7 cm by CT.  He has a positive family history of aneurysm.  He is a former smoker.  He returns today for follow-up.  He denies any issues with severe abdominal, back, or chest pain.  He has some chronic lower back pain.  He denies any pain in his lower extremities when walking.  He takes a daily aspirin and statin. Current Outpatient Medications  Medication Sig Dispense Refill   acetaminophen (TYLENOL) 325 MG tablet Take 1-2 tablets (325-650 mg total) by mouth every 4 (four) hours as needed for mild pain.     aspirin EC 81 MG tablet Take 81 mg by mouth in the morning.     Cyanocobalamin (B-12 PO) Take 1 tablet by mouth daily with lunch.     DULoxetine (CYMBALTA) 60 MG capsule Take 1 capsule (60 mg total) by mouth daily. 90 capsule 3   ferrous sulfate 325 (65 FE) MG EC tablet Take 325 mg by mouth daily with lunch.     Fluocinonide (LIDEX EX) Apply 1 application  topically daily as needed (rash).     gabapentin (NEURONTIN) 300 MG capsule Take 1 capsule (300 mg total) by mouth 3 (three) times daily. 90 capsule 11   ibuprofen (ADVIL) 200 MG tablet Take 400 mg by mouth daily as needed for headache or mild pain. (Patient not taking: Reported on 02/20/2023)     Multiple Vitamins-Minerals (CENTRUM SILVER) tablet Take 1 tablet by mouth daily with lunch.     Omega-3 Fatty Acids (FISH OIL PO) Take 1 capsule by mouth daily with lunch. When able to remember     rosuvastatin (CRESTOR) 5 MG tablet Take 1 tablet (5 mg total) by mouth daily. 90 tablet 3   sildenafil (VIAGRA) 100 MG tablet TAKE 1/2 TO 1 TABLET(50 TO 100 MG) BY MOUTH DAILY AS NEEDED FOR ERECTILE DYSFUNCTION 5 tablet 3   triamcinolone cream (KENALOG) 0.1 % Apply 1 Application topically 2 (two) times daily. 30 g 0    No current facility-administered medications for this visit.    REVIEW OF SYSTEMS (negative unless checked):   Cardiac:  []  Chest pain or chest pressure? []  Shortness of breath upon activity? []  Shortness of breath when lying flat? []  Irregular heart rhythm?  Vascular:  []  Pain in calf, thigh, or hip brought on by walking? []  Pain in feet at night that wakes you up from your sleep? []  Blood clot in your veins? []  Leg swelling?  Pulmonary:  []  Oxygen at home? []  Productive cough? []  Wheezing?  Neurologic:  []  Sudden weakness in arms or legs? []  Sudden numbness in arms or legs? []  Sudden onset of difficult speaking or slurred speech? []  Temporary loss of vision in one eye? []  Problems with dizziness?  Gastrointestinal:  []  Blood in stool? []  Vomited blood?  Genitourinary:  []  Burning when urinating? []  Blood in urine?  Psychiatric:  []  Major depression  Hematologic:  []  Bleeding problems? []  Problems with blood clotting?  Dermatologic:  []  Rashes or ulcers?  Constitutional:  []  Fever or chills?  Ear/Nose/Throat:  []  Change in hearing? []  Nose bleeds? []  Sore throat?  Musculoskeletal:  [x]  Back pain? []  Joint pain? []  Muscle pain?   Physical Examination  Vitals:   05/08/23 0925  BP: 136/82  Pulse: 64  Resp: 16  Temp: 97.9 F (36.6 C)  TempSrc: Temporal  SpO2: 96%  Weight: 206 lb (93.4 kg)  Height: 5\' 9"  (1.753 m)   Body mass index is 30.42 kg/m.  General:  WDWN in NAD; vital signs documented above Gait: Not observed HENT: WNL, normocephalic Pulmonary: normal non-labored breathing , without rales, rhonchi,  wheezing Cardiac: regular Abdomen: soft, NT, no masses Skin: without rashes Vascular Exam/Pulses: Palpable pedal pulses bilaterally Extremities: without ischemic changes, without gangrene , without cellulitis; without open wounds;  Musculoskeletal: no muscle wasting or atrophy  Neurologic: A&O X 3;  No focal weakness or  paresthesias are detected Psychiatric:  The pt has Normal affect.   Non-Invasive Vascular Imaging   AAA Duplex (05/08/2023) Current size: 4.7 cm Previous size: 4.7 cm (11/23) R CIA: 1.4 cm L CIA: 1.6 cm  Bilateral Lower Extremity Arterial Duplex (05/08/2023) No popliteal artery aneurysms identified in either lower extremity.   Medical Decision Making   Vedansh Kerstetter is a 72 y.o. ([birth year]) male who presents for surveillance of AAA  Based on this patient's duplex, his AAA has not changed in size.  The maximum measurement is 4.7 cm. His popliteal arteries were evaluated for aneurysms by duplex but his study was negative.  He will not require a repeat popliteal artery duplex. He denies any abdominal The threshold for repair is AAA size > 5.5 cm, growth > 1 cm/yr, and symptomatic status. He will continue his aspirin and statin.  He can follow-up with our office in 6 months for repeat AAA duplex.  Loel Dubonnet PA-C [facility] Office: [phone]  Clinic MD: Randie Heinz

## 2023-05-20 DIAGNOSIS — L814 Other melanin hyperpigmentation: Secondary | ICD-10-CM | POA: Diagnosis not present

## 2023-05-20 DIAGNOSIS — D225 Melanocytic nevi of trunk: Secondary | ICD-10-CM | POA: Diagnosis not present

## 2023-05-20 DIAGNOSIS — L578 Other skin changes due to chronic exposure to nonionizing radiation: Secondary | ICD-10-CM | POA: Diagnosis not present

## 2023-05-20 DIAGNOSIS — L821 Other seborrheic keratosis: Secondary | ICD-10-CM | POA: Diagnosis not present

## 2023-05-22 ENCOUNTER — Ambulatory Visit: Payer: Medicare PPO | Admitting: Family Medicine

## 2023-05-22 ENCOUNTER — Encounter: Payer: Self-pay | Admitting: Family Medicine

## 2023-05-22 VITALS — BP 118/66 | HR 89 | Temp 97.3°F | Ht 69.0 in | Wt 213.2 lb

## 2023-05-22 DIAGNOSIS — E538 Deficiency of other specified B group vitamins: Secondary | ICD-10-CM

## 2023-05-22 DIAGNOSIS — E611 Iron deficiency: Secondary | ICD-10-CM

## 2023-05-22 DIAGNOSIS — I7143 Infrarenal abdominal aortic aneurysm, without rupture: Secondary | ICD-10-CM | POA: Diagnosis not present

## 2023-05-22 DIAGNOSIS — E781 Pure hyperglyceridemia: Secondary | ICD-10-CM | POA: Diagnosis not present

## 2023-05-22 DIAGNOSIS — I10 Essential (primary) hypertension: Secondary | ICD-10-CM

## 2023-05-22 DIAGNOSIS — G6181 Chronic inflammatory demyelinating polyneuritis: Secondary | ICD-10-CM | POA: Diagnosis not present

## 2023-05-22 NOTE — Assessment & Plan Note (Signed)
I will recheck iron studies, now that he is off of his iron.

## 2023-05-22 NOTE — Assessment & Plan Note (Signed)
Blood pressure is in good control. Smoking cessation appears to have resolved this issue.

## 2023-05-22 NOTE — Assessment & Plan Note (Signed)
I will reassess lipids now that he is on rosuvastatin 5 mg daily.

## 2023-05-22 NOTE — Assessment & Plan Note (Signed)
Stable findings on CT scan. We will plan to repeat this in Nov.

## 2023-05-22 NOTE — Progress Notes (Signed)
[facility] [address] [place] [address] Dept: [phone] Dept Fax: [phone]  Chronic Care Office Visit  Subjective:    Patient ID: Joe Greene, male    DOB: 1951-06-29, 72 y.o..   MRN: [id number]  Chief Complaint  Patient presents with   Medical Management of Chronic Issues    3 month f/u.  No concerns    History of Present Illness:  Patient is in today for reassessment of chronic medical issues.  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 This condition started for him initially on Nov. 9th, following a URI about 2 weeks earlier. He underwent IVIG treatment from Nov. 14-18th. His symptoms were worsening in Dec., when he was admitted for plasma exchange. He had a repeat hospitalization in early Jan.,where he was treated with another round of IVIG. He is now on IVIG infusions every 3 weeks, which are continuing for the time being. He notes his strength is much better than this Winter, though he is still not back to baseline. He is ambulating well, but notes some fatigue later int he day, esp. in the legs and some tingling. He was seen at [facility] for a 2nd opinion on therapy. He notes they felt like Joe Greene was providing appropriate standard care. Joe Greene is also treated with gabapentin 600 mg BID and duloxetine 60 mg daily for neuropathic  symptoms.    Joe Greene has a history of hypertension. He continues to abstain from smoking and is not on medication currently for BP control. He admits he has a desire to smoke, but has been pleased to see his BP normalize.   Joe Greene has a history of an abdominal aortic aneurysm. He is having a CT scan every 6 months to monitor this. He was started on rosuvastatin 5 mg this Winter.  Past Medical History: Patient Active Problem List   Diagnosis Date Noted   Dermatitis 02/20/2023   Lumbar radiculopathy 01/01/2023   Left-sided  low back pain with left-sided sciatica 12/11/2022   CIDP (chronic inflammatory demyelinating polyneuropathy) (HCC) 12/11/2022   Acute sensory neuropathy 10/25/2022   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/2022   B12 deficiency 10/18/2022   Gait abnormality 10/18/2022   Pre-diabetes 09/25/2022   AAA (abdominal aortic aneurysm) (HCC) 09/24/2022   Essential hypertension 09/24/2022   Iron deficiency 04/04/2022   Hypertriglyceridemia 04/03/2022   Coronary artery calcification 01/18/2022   Candidal intertrigo    Emphysema of lung (HCC) 07/26/2021   Aortic atherosclerosis (HCC) 07/26/2021   Internal hemorrhoids 01/23/2021   Diverticula of colon 01/23/2021   Eczema 01/23/2021   Actinic keratoses 01/23/2021   Tobacco use disorder, moderate, in early remission 09/02/2020   Past Surgical History:  Procedure Laterality Date   FOOT SURGERY Right    HERNIA REPAIR Right    IR FLUORO GUIDE CV LINE RIGHT  10/25/2022   IR US GUIDE VASC ACCESS RIGHT  10/25/2022   KNEE ARTHROSCOPY Left    WISDOM TOOTH EXTRACTION     Family History  Problem Relation Age of Onset   Stroke Mother 28   AAA (abdominal aortic aneurysm) Mother    Cancer Father        Prostate   Colon cancer Neg Hx    Colon polyps Neg Hx    Esophageal cancer Neg Hx    Rectal cancer Neg Hx    Stomach cancer Neg Hx    Outpatient Medications Prior to  Visit  Medication Sig Dispense Refill   acetaminophen (TYLENOL) 325 MG tablet Take 1-2 tablets (325-650 mg total) by mouth every 4 (four) hours as needed for mild pain.     aspirin EC 81 MG tablet Take 81 mg by mouth in the morning.     DULoxetine (CYMBALTA) 60 MG capsule Take 1 capsule (60 mg total) by mouth daily. 90 capsule 3   Fluocinonide (LIDEX EX) Apply 1 application  topically daily as needed (rash).     gabapentin (NEURONTIN) 300 MG capsule Take 1 capsule (300 mg total) by mouth 3 (three) times daily. 90 capsule 11   ibuprofen (ADVIL) 200 MG tablet  Take 400 mg by mouth daily as needed for headache or mild pain.     immune globulin,gamma, IGG, (BAYGAM) injection Inject into the muscle.     Omega-3 Fatty Acids (FISH OIL PO) Take 1 capsule by mouth daily with lunch. When able to remember     rosuvastatin (CRESTOR) 5 MG tablet Take 1 tablet (5 mg total) by mouth daily. 90 tablet 3   sildenafil (VIAGRA) 100 MG tablet TAKE 1/2 TO 1 TABLET(50 TO 100 MG) BY MOUTH DAILY AS NEEDED FOR ERECTILE DYSFUNCTION 5 tablet 3   triamcinolone cream (KENALOG) 0.1 % Apply 1 Application topically 2 (two) times daily. 30 g 0   Cyanocobalamin (B-12 PO) Take 1 tablet by mouth daily with lunch. (Patient not taking: Reported on 05/22/2023)     ferrous sulfate 325 (65 FE) MG EC tablet Take 325 mg by mouth daily with lunch. (Patient not taking: Reported on 05/22/2023)     Multiple Vitamins-Minerals (CENTRUM SILVER) tablet Take 1 tablet by mouth daily with lunch. (Patient not taking: Reported on 05/22/2023)     No facility-administered medications prior to visit.   No Known Allergies Objective:   Today's Vitals   05/22/23 1507  BP: 118/66  Pulse: 89  Temp: (!) 97.3 F (36.3 C)  TempSrc: Temporal  SpO2: 95%  Weight: 213 lb 3.2 oz (96.7 kg)  Height: 5\' 9"  (1.753 m)   Body mass index is 31.48 kg/m.   General: Well developed, well nourished. No acute distress. Psych: Alert and oriented. Normal mood and affect.  Health Maintenance Due  Topic Date Due   Hepatitis C Screening  Never done     Imaging: CT of Abdomen and Pelvis wo contrast (03/25/2023) IMPRESSION: Stable 4.7 cm infrarenal abdominal aortic aneurysm. Recommend follow-up CT/MR every 6 months and vascular consultation. This recommendation follows ACR consensus guidelines: White Paper of the ACR Incidental Findings Committee II on Vascular Findings. J Am Coll Radiol 2013; 10:789-794.   Tiny left renal calculus. No evidence of ureteral calculi or hydronephrosis.   Colonic diverticulosis, without  radiographic evidence of diverticulitis.   Stable small bilateral inguinal hernias, which contain only fat.  Assessment & Plan:   Problem List Items Addressed This Visit       Cardiovascular and Mediastinum   AAA (abdominal aortic aneurysm) (HCC) (Chronic)    Stable findings on CT scan. We will plan to repeat this in Nov.      Essential hypertension    Blood pressure is in good control. Smoking cessation appears to have resolved this issue.        Nervous and Auditory   CIDP (chronic inflammatory demyelinating polyneuropathy) (HCC) - Primary    Mr. Steadham feels his progress has plateaued. he does plan to continue infusions and may start to follow with [facility].  Other   Hypertriglyceridemia    I will reassess lipids now that he is on rosuvastatin 5 mg daily.      Relevant Orders   Lipid panel   Iron deficiency    I will recheck iron studies, now that he is off of his iron.      Relevant Orders   Iron, TIBC and Ferritin Panel   B12 deficiency    Has stopped taking Vit. B12. I will recheck his levels.      Relevant Orders   Vitamin B12    Return in about 3 months (around 08/22/2023) for Reassessment.   Loyola Mast, MD

## 2023-05-22 NOTE — Assessment & Plan Note (Addendum)
Joe Greene feels his progress has plateaued. he does plan to continue infusions and may start to follow with [facility].

## 2023-05-22 NOTE — Assessment & Plan Note (Signed)
Has stopped taking Vit. B12. I will recheck his levels.

## 2023-05-25 ENCOUNTER — Other Ambulatory Visit: Payer: Self-pay

## 2023-05-25 DIAGNOSIS — I7143 Infrarenal abdominal aortic aneurysm, without rupture: Secondary | ICD-10-CM

## 2023-05-28 ENCOUNTER — Other Ambulatory Visit: Payer: Medicare PPO

## 2023-05-28 DIAGNOSIS — E781 Pure hyperglyceridemia: Secondary | ICD-10-CM

## 2023-05-28 DIAGNOSIS — E538 Deficiency of other specified B group vitamins: Secondary | ICD-10-CM

## 2023-05-28 DIAGNOSIS — G610 Guillain-Barre syndrome: Secondary | ICD-10-CM | POA: Diagnosis not present

## 2023-05-28 DIAGNOSIS — E611 Iron deficiency: Secondary | ICD-10-CM

## 2023-05-28 LAB — VITAMIN B12: Vitamin B-12: 270 pg/mL (ref 211–911)

## 2023-05-28 LAB — LDL CHOLESTEROL, DIRECT: Direct LDL: 73 mg/dL

## 2023-05-28 LAB — LIPID PANEL
Cholesterol: 146 mg/dL (ref 0–200)
HDL: 33.5 mg/dL — ABNORMAL LOW (ref >39.00)
NonHDL: 112.16
Total CHOL/HDL Ratio: 4
Triglycerides: 274 mg/dL — ABNORMAL HIGH (ref 0.0–149.0)
VLDL: 54.8 mg/dL — ABNORMAL HIGH (ref 0.0–40.0)

## 2023-05-29 DIAGNOSIS — G610 Guillain-Barre syndrome: Secondary | ICD-10-CM | POA: Diagnosis not present

## 2023-05-29 LAB — IRON,TIBC AND FERRITIN PANEL
%SAT: 28 % (calc) (ref 20–48)
Ferritin: 109 ng/mL (ref 24–380)
Iron: 90 ug/dL (ref 50–180)
TIBC: 324 mcg/dL (calc) (ref 250–425)

## 2023-06-03 NOTE — Progress Notes (Unsigned)
No chief complaint on file.     ASSESSMENT AND PLAN  Joe Greene is a 72 y.o. male   Chronic inflammatory demyelinating polyradiculoneuropathy  Started since early November 2023 following upper respiratory infection, loss of taste 2 weeks prior to the symptom onset  mild to moderate improvement following IVIG from November 14-18 2023, began to have worsening symptoms again since early December 2023, hospital admission again on Dec 14 for plasma Change, did help his symptoms, following that, at his best, he can ambulate without assistance, Hospital admission again on January 4th 2024, IVIG 2 g/kg completed on January 8, Out patient IVIG, 2 g/kg loading dose on the week of December 10, 2022, then 1 g/kg every 3 weeks, his symptoms began to improve steadily, improved gait, less neuropathic pain, which has helped by Cymbalta 30 mg, will titrating to 60 mg daily, gabapentin 300 mg as needed  Worsening gait abnormality, low back pain, radiating pain to left lower extremity, left hip,  X-ray of left hip showed no acute abnormality  MRI of lumbar showed multilevel degenerative changes, most noticeable L3-4 L4-5, with moderate to severe foraminal narrowing         DIAGNOSTIC DATA (LABS, IMAGING, TESTING) - I reviewed patient records, labs, notes, testing and imaging myself where available.     MEDICAL HISTORY:  Joe Greene, is a 72 year old male seen in request by his primary care physician Dr. 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 PMHX HTN Smoker, 2ppd, quit since Sep 23 2022.  Patient has been active all his life, began to noticed toes and fingertips numbness tingling on September 17, 2022, prior to that, he has suffered upper respiratory infection for 2 weeks, cough, loss of taste,  By November 10, he noticed progressive numbness upper  and lower extremity, and also developed gait abnormality,  Symptoms continue to progressively getting worse, leading to emergency room presentation September 24, 2022, also had intractable headache,  He was admitted with diagnosis of probable Guillain-Barr, lumbar puncture September 25, 2022 showed wbc, 4, RBC 0,  TP 89, Glucose 80,   He had 5 days of IVIG, began to notice improvement shortly afterwards, continue to make progress, to the point he can ambulate without assistance at home for few days, but since early December, he noticed regression, increased numbness, gait abnormality,   Update October 24, 2022: Patient returns for electrodiagnostic study today, which confirmed acute demyelinating polyradiculoneuropathy, there was no evidence of significant axonal loss He had symptom onset early November, received IVIG from November 14 for 5 consecutive days, he began to notice an after second and third dose, the benefit lasted about 2 weeks, at his best, he could take few steps at home without assistant,  Since beginning of December 2023, 2 weeks after IVIG infusion, he began to notice worsening bilateral lower extremity ascending numbness, weakness, now could barely take a step, sitting in wheelchair most of the time, also frequent shortness of breath episode, sometimes waking up in the middle of the night  Also complains of difficulty emptying his bladder, urinary frequency, baseline elevated heart rate, consistent with autonomic involvement  We have attempted outpatient IVIG infusion, but due to insurance and preauthorization limitations, it needed few weeks to complete the process.  With his worsening symptoms, especially autonomic involvement, shortness of breath, continue progress of symptoms, per patient even worse than prior to  treatment level in the middle of November, will refer him to hospital for either repeat IVIG or even consider plasma exchange  UPDATE Nov 21 2022: He was admitted  to hospital following his electrodiagnostic study because of his complaints of worsening ascending paresthesia, gait abnormality  Central line placed on 12/14 he was started on plasma exchange, 5 rounds, every other day, with improvement in symptoms.  He completed  NIF-32 and vital capacity 2.7 L.  Then was discharged to inpatient rehabilitation on November 02, 2022, which continues improvement, at his best, he can ambulate without assistance  Shortly after he was discharged home on November 10, 2022, he noticed regression, hospital admission again on January 4th, IVIG 2 g/kg Completed on January 8th 2023, this round, he only noticed mild improvement  Discharge neurological examination showed normal upper extremity strength, mild distal upper extremity, hip flexion, distal leg weakness,  Patient complains of continued lower extremity paresthesia, weakness, gait abnormality, fingertips paresthesia, loss of dexterity of his fingers  He denies swallowing difficulty, breathing difficulty, no significant autonomic symptoms  UPDATE Dec 11 2022 Dr. Terrace Arabia: Patient is accompanied by his wife at today's clinical visit, continue worsening lower extremity weakness, increased gait abnormality about 2 to 3 weeks following previous IVIG infusion, started outpatient IVIG loading 2 g/kg since December 10, 2021, tolerating infusion well  He complains of slow worsening low back pain, radiating pain to left hip, lateral thigh, paresthesia, increased after bearing weight, feels very discouraged that he has recurrent symptoms even with frequent IVIG treatment, also complains of intermittent upper extremity paresthesia, extending to above wrist level  UPDATE Feb 202 24 Dr. Terrace Arabia: He is accompanied his wife at today's visit, overall doing better, continue have low back pain radiating pain to left hip, intermittent bilateral lower extremity paresthesia, unsteady gait uses cane, but overall has much improved, IVIG every 3 weeks  has been helpful, last infusion was on February 19 and 20  Personally reviewed MRI of lumbar December 29, 2022, multilevel degenerative changes, prominent disc and facet degenerative disease most prominent at L4-5, severe bilateral foraminal narrowing, L3-4 moderate bilateral foraminal narrowing  He denies bowel or bladder incontinence, intermittent bilateral hands paresthesia  Left hip x-ray showed no significant abnormality  He tolerated Cymbalta 30 mg,   Update 06/04/2023 JM:  Was seen by [facility] neurology for 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       PHYSICAL EXAM:   There were no vitals filed for this visit.  There is no height or weight on file to calculate BMI.  PHYSICAL EXAMNIATION:  Gen: NAD, conversant, well nourised, well groomed                     Cardiovascular: Regular rate rhythm, no peripheral edema, warm, nontender. Eyes: Conjunctivae clear without exudates or hemorrhage Neck: Supple, no carotid bruits. Pulmonary: Clear to auscultation bilaterally   NEUROLOGICAL EXAM:  MENTAL STATUS: Speech/cognition: Awake, alert, oriented to history taking and casual conversation, normal breathing CRANIAL NERVES: CN II: Visual fields are full to confrontation. Pupils are round equal and briskly reactive to light. CN III, IV, VI: extraocular movement are normal. No ptosis. CN V: Facial sensation is intact to light touch CN VII: Face is symmetric with normal eye closure  CN VIII: Hearing is normal to causal conversation. CN IX, X: Phonation is normal. CN XI: Head turning and shoulder shrug are intact  MOTOR: He has no significant bilateral upper and  lower extremity muscle weakness   REFLEXES: Areflexia  SENSORY:  Length-dependent decreased light touch, pinprick sensation to midfoot level; vibratory sensation to ankle level; decreased toe proprioception Absent finger vibratory sensation, preserved finger  proprioception, decreased pinprick to wrist level  COORDINATION: There is no trunk or limb dysmetria noted.  GAIT/STANCE: Able to get up from seated position arm crossed, wide-based, cautious, positive Romberg signs,  REVIEW OF SYSTEMS:  Full 14 system review of systems performed and notable only for as above All other review of systems were negative.   ALLERGIES: No Known Allergies  HOME MEDICATIONS: Current Outpatient Medications  Medication Sig Dispense Refill   acetaminophen (TYLENOL) 325 MG tablet Take 1-2 tablets (325-650 mg total) by mouth every 4 (four) hours as needed for mild pain.     aspirin EC 81 MG tablet Take 81 mg by mouth in the morning.     DULoxetine (CYMBALTA) 60 MG capsule Take 1 capsule (60 mg total) by mouth daily. 90 capsule 3   Fluocinonide (LIDEX EX) Apply 1 application  topically daily as needed (rash).     gabapentin (NEURONTIN) 300 MG capsule Take 1 capsule (300 mg total) by mouth 3 (three) times daily. 90 capsule 11   ibuprofen (ADVIL) 200 MG tablet Take 400 mg by mouth daily as needed for headache or mild pain.     immune globulin,gamma, IGG, (BAYGAM) injection Inject into the muscle.     Omega-3 Fatty Acids (FISH OIL PO) Take 1 capsule by mouth daily with lunch. When able to remember     rosuvastatin (CRESTOR) 5 MG tablet Take 1 tablet (5 mg total) by mouth daily. 90 tablet 3   sildenafil (VIAGRA) 100 MG tablet TAKE 1/2 TO 1 TABLET(50 TO 100 MG) BY MOUTH DAILY AS NEEDED FOR ERECTILE DYSFUNCTION 5 tablet 3   triamcinolone cream (KENALOG) 0.1 % Apply 1 Application topically 2 (two) times daily. 30 g 0   No current facility-administered medications for this visit.    PAST MEDICAL HISTORY: Past Medical History:  Diagnosis Date   AAA (abdominal aortic aneurysm) (HCC)    Cataract    forming    Guillain Barr syndrome (HCC)     PAST SURGICAL HISTORY: Past Surgical History:  Procedure Laterality Date   FOOT SURGERY Right    HERNIA REPAIR Right     IR FLUORO GUIDE CV LINE RIGHT  10/25/2022   IR US GUIDE VASC ACCESS RIGHT  10/25/2022   KNEE ARTHROSCOPY Left    WISDOM TOOTH EXTRACTION      FAMILY HISTORY: Family History  Problem Relation Age of Onset   Stroke Mother 60   AAA (abdominal aortic aneurysm) Mother    Cancer Father        Prostate   Colon cancer Neg Hx    Colon polyps Neg Hx    Esophageal cancer Neg Hx    Rectal cancer Neg Hx    Stomach cancer Neg Hx     SOCIAL HISTORY: Social History   Socioeconomic History   Marital status: Married    Spouse name: Shanon Becvar   Number of children: 2   Years of education: Not on file   Highest education level: Doctorate  Occupational History   Occupation: Retired    Comment: Scientist, research (medical)- [place]  Tobacco Use   Smoking status: Former    Current packs/day: 0.00    Average packs/day: 2.0 packs/day for 50.0 years (100.0 ttl pk-yrs)    Types: Cigarettes    Start  date: 09/18/1972    Quit date: 09/18/2022    Years since quitting: 0.7   Smokeless tobacco: Never  Vaping Use   Vaping status: Never Used  Substance and Sexual Activity   Alcohol use: Yes    Alcohol/week: 3.0 standard drinks of alcohol    Types: 3 Standard drinks or equivalent per week    Comment: 2 or 3 drinks per week   Drug use: Never   Sexual activity: Yes  Other Topics Concern   Not on file  Social History Narrative   Not on file   Social Determinants of Health   Financial Resource Strain: Low Risk  (02/16/2023)   Overall Financial Resource Strain (CARDIA)    Difficulty of Paying Living Expenses: Not hard at all  Food Insecurity: No Food Insecurity (02/16/2023)   Hunger Vital Sign    Worried About Running Out of Food in the Last Year: Never true    Ran Out of Food in the Last Year: Never true  Transportation Needs: No Transportation Needs (02/16/2023)   PRAPARE - Administrator, Civil Service (Medical): No    Lack of Transportation (Non-Medical): No  Physical Activity: Unknown  (02/16/2023)   Exercise Vital Sign    Days of Exercise per Week: 5 days    Minutes of Exercise per Session: Patient declined  Recent Concern: Physical Activity - Inactive (12/17/2022)   Exercise Vital Sign    Days of Exercise per Week: 0 days    Minutes of Exercise per Session: 0 min  Stress: No Stress Concern Present (02/16/2023)   [employer] of Occupational Health - Occupational Stress Questionnaire    Feeling of Stress : Not at all  Social Connections: Socially Integrated (02/16/2023)   Social Connection and Isolation Panel [NHANES]    Frequency of Communication with Friends and Family: More than three times a week    Frequency of Social Gatherings with Friends and Family: Once a week    Attends Religious Services: More than 4 times per year    Active Member of [employer] or Organizations: Yes    Attends Banker Meetings: More than 4 times per year    Marital Status: Married  Catering manager Violence: Not At Risk (11/18/2022)   Humiliation, Afraid, Rape, and Kick questionnaire    Fear of Current or Ex-Partner: No    Emotionally Abused: No    Physically Abused: No    Sexually Abused: No     I spent *** minutes of face-to-face and non-face-to-face time with patient.  This included previsit chart review, lab review, study review, order entry, electronic health record documentation, patient education and discussion regarding above diagnoses and treatment plan and answered all other questions to patient's satisfaction  Ihor Austin, [facility]  [facility] [address] [place], [place] [address]  Phone [phone] Fax [phone] Note: This document was prepared with digital dictation and possible smart phrase technology. Any transcriptional errors that result from this process are unintentional.

## 2023-06-04 ENCOUNTER — Ambulatory Visit: Payer: Medicare PPO | Admitting: Adult Health

## 2023-06-04 ENCOUNTER — Encounter: Payer: Self-pay | Admitting: Adult Health

## 2023-06-04 VITALS — BP 122/73 | HR 70 | Ht 69.0 in | Wt 210.0 lb

## 2023-06-04 DIAGNOSIS — G6181 Chronic inflammatory demyelinating polyneuritis: Secondary | ICD-10-CM | POA: Diagnosis not present

## 2023-06-04 MED ORDER — DULOXETINE HCL 40 MG PO CSDR: 40.0000 mg | DELAYED_RELEASE_CAPSULE | Freq: Every day | ORAL | 5 refills | Status: DC

## 2023-06-04 NOTE — Patient Instructions (Addendum)
Your Plan:  Continue IVIG for now - will follow up with Dr. Terrace Arabia regarding when she would like to repeat EMG/NCV   Decrease duloxetine to 40mg  daily - please let me know if you have any worsening pains with decreased dose  Continue gabapentin 600mg  twice daily   Try magnesium supplement to see if this helps with uncomfortable sensation at night - if interested in trying a prescription medication for this, please let me know    Follow up will be determined based on when Dr. Terrace Arabia would like to repeat study - will keep you updated via MyChart     Thank you for coming to see [place] at [facility]. I hope we have been able to provide you high quality care today.  You may receive a patient satisfaction survey over the next few weeks. We would appreciate your feedback and comments so that we may continue to improve ourselves and the health of our patients.

## 2023-06-17 DIAGNOSIS — G610 Guillain-Barre syndrome: Secondary | ICD-10-CM | POA: Diagnosis not present

## 2023-06-18 DIAGNOSIS — G610 Guillain-Barre syndrome: Secondary | ICD-10-CM | POA: Diagnosis not present

## 2023-06-18 NOTE — Progress Notes (Signed)
Patient reported significant improvement with continue IVIG treatment, will repeat EMG nerve conduction study, previous test showed no significant axonal loss,

## 2023-06-18 NOTE — Addendum Note (Signed)
Addended by: Levert Feinstein on: 06/18/2023 09:38 AM   Modules accepted: Orders

## 2023-06-19 ENCOUNTER — Encounter: Payer: Self-pay | Admitting: Adult Health

## 2023-07-08 DIAGNOSIS — G610 Guillain-Barre syndrome: Secondary | ICD-10-CM | POA: Diagnosis not present

## 2023-07-09 DIAGNOSIS — G610 Guillain-Barre syndrome: Secondary | ICD-10-CM | POA: Diagnosis not present

## 2023-07-29 DIAGNOSIS — G610 Guillain-Barre syndrome: Secondary | ICD-10-CM | POA: Diagnosis not present

## 2023-07-30 DIAGNOSIS — G610 Guillain-Barre syndrome: Secondary | ICD-10-CM | POA: Diagnosis not present

## 2023-07-31 ENCOUNTER — Encounter: Payer: Self-pay | Admitting: Family Medicine

## 2023-07-31 MED ORDER — SILDENAFIL CITRATE 100 MG PO TABS: 100.0000 mg | ORAL_TABLET | ORAL | 5 refills | Status: DC | PRN

## 2023-07-31 NOTE — Telephone Encounter (Signed)
Please review and advise. Thanks. Dm/cma  

## 2023-08-05 ENCOUNTER — Other Ambulatory Visit: Payer: Self-pay | Admitting: Family Medicine

## 2023-08-09 ENCOUNTER — Ambulatory Visit (INDEPENDENT_AMBULATORY_CARE_PROVIDER_SITE_OTHER): Payer: Medicare PPO | Admitting: Neurology

## 2023-08-09 ENCOUNTER — Ambulatory Visit: Payer: Self-pay | Admitting: Neurology

## 2023-08-09 VITALS — BP 132/81 | HR 70 | Ht 69.0 in | Wt 210.0 lb

## 2023-08-09 DIAGNOSIS — G6181 Chronic inflammatory demyelinating polyneuritis: Secondary | ICD-10-CM | POA: Diagnosis not present

## 2023-08-09 DIAGNOSIS — M792 Neuralgia and neuritis, unspecified: Secondary | ICD-10-CM

## 2023-08-09 DIAGNOSIS — Z0289 Encounter for other administrative examinations: Secondary | ICD-10-CM

## 2023-08-09 DIAGNOSIS — G610 Guillain-Barre syndrome: Secondary | ICD-10-CM | POA: Insufficient documentation

## 2023-08-09 MED ORDER — GABAPENTIN 300 MG PO CAPS
900.0000 mg | ORAL_CAPSULE | Freq: Three times a day (TID) | ORAL | 11 refills | Status: DC
Start: 2023-08-09 — End: 2024-02-17

## 2023-08-09 MED ORDER — DULOXETINE HCL 60 MG PO CPEP: 60.0000 mg | ORAL_CAPSULE | Freq: Every day | ORAL | 3 refills | Status: DC

## 2023-08-09 NOTE — Procedures (Signed)
Full Name: Joe Greene Gender: Male MRN #: [id number] Date of Birth: 19-Feb-1951    Visit Date: 08/09/2023 10:12 Age: 72 Years Examining Physician: Dr. 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  Left median sensory response was absent.  Left ulnar sensory response showed moderately prolonged peak latency.  Left radial sensory response showed slightly decreased snap amplitude.  Left median motor response showed moderately prolonged distal latency, mild slow conduction velocity, significantly prolonged F-wave latency.  Left ulnar motor response also demonstrated moderately prolonged distal latency, mildly decreased CMAP amplitude, moderately prolonged F-wave latency  Bilateral tibial motor responses were absent.  Right peroneal to EDB motor responses showed significantly prolonged distal latency with moderate to significantly decreased CMAP amplitude,  Left peroneal to EDB motor response showed significantly prolonged distal latency normal CMAP amplitude, moderately slow conduction velocity  Electromyography: Selected needle examinations of the right upper, lower extremity muscles, cervical and lumbosacral paraspinal muscles showed no significant abnormalities.  Conclusion: This is an abnormal study.  There is continued evidence of patchy demyelinating polyradiculoneuropathy, as evident by left sural sensory spare, prolonged left median, ulnar F-wave latencies.  Compared to previous study in December 2023, there is significant improvement.    ------------------------------- Levert Feinstein,  M.D.Ph.D.  [facility] [address], [address] [place], [place] [address] Tel: [phone] Fax: [phone]  Verbal informed consent was obtained from the patient, patient was informed of potential risk of procedure, including bruising, bleeding, hematoma formation, infection, muscle weakness, muscle pain, numbness, among others.        MNC    Nerve / Sites Muscle Latency Ref. Amplitude Ref. Rel Amp Segments Distance Velocity Ref. Area    ms ms mV mV %  cm m/s m/s mVms  L Median - APB     Wrist APB 5.8 <=4.4 7.4 >=4.0 100 Wrist - APB 7   27.9     Upper arm APB 11.0  7.0  94.2 Upper arm - Wrist 23 44 >=49 27.6  L Ulnar - ADM     Wrist ADM 4.7 <=3.3 5.3 >=6.0 100 Wrist - ADM 13   17.5     B.Elbow ADM 6.9  4.3  81.5 B.Elbow - Wrist 13 59 >=49 13.8     A.Elbow ADM 10.1  4.6  105 A.Elbow - B.Elbow 17 54 >=49 17.6  L Peroneal - EDB     Ankle EDB 8.5 <=6.5 3.9 >=2.0 100 Ankle - EDB 9   16.4     Fib head EDB 17.7  1.7  43.1 Fib head - Ankle 28 30 >=44 8.4     Pop fossa EDB 23.0  2.6  154 Pop fossa - Fib head 14 27 >=44 12.3         Pop fossa - Ankle      R Peroneal - EDB     Ankle EDB 9.5 <=6.5 1.2 >=2.0 100 Ankle - EDB 9   4.5     Fib head EDB 21.6  0.3  26.7 Fib head - Ankle 29 24 >=44 0.7     Pop fossa EDB 23.8  0.9  273 Pop fossa - Fib  head 11 50 >=44 4.6         Pop fossa - Ankle      L Tibial - AH     Ankle AH NR <=5.8 NR >=4.0 NR Ankle - AH 9   NR  R Tibial - AH     Ankle AH NR <=5.8 NR >=4.0 NR Ankle - AH 9   NR                 SNC    Nerve / Sites Rec. Site Peak Lat Ref.  Amp Ref. Segments Distance    ms ms V V  cm  L Radial - Anatomical snuff box (Forearm)     Forearm Wrist 2.5 <=2.9 12 >=15 Forearm - Wrist 10  L Sural - Ankle (Calf)     Calf Ankle 3.3 <=4.4 7 >=6 Calf - Ankle 14  R Sural - Ankle (Calf)     Calf Ankle NR <=4.4 NR >=6 Calf - Ankle 14  L Superficial peroneal - Ankle     Lat leg Ankle NR <=4.4 NR >=6 Lat leg - Ankle 14  R  Superficial peroneal - Ankle     Lat leg Ankle NR <=4.4 NR >=6 Lat leg - Ankle 14  L Median - Orthodromic (Dig II, Mid palm)     Dig II Wrist NR <=3.4 NR >=10 Dig II - Wrist 13  L Ulnar - Orthodromic, (Dig V, Mid palm)     Dig V Wrist 3.7 <=3.1 6 >=5 Dig V - Wrist 52                   F  Wave    Nerve F Lat Ref.   ms ms  L Ulnar - ADM 34.5 <=32.0  L Median - APB 39.3 <=31.0         EMG Summary Table    Spontaneous MUAP Recruitment  Muscle IA Fib PSW Fasc Other Amp Dur. Poly Pattern  L. Tibialis anterior Normal None None None _______ Normal Normal Normal Normal  L. Tibialis posterior Normal None None None _______ Normal Normal Normal Normal  L. Peroneus longus Normal None None None _______ Normal Normal Normal Normal  L. Vastus lateralis Normal None None None _______ Normal Normal Normal Normal  L. Lumbar paraspinals (low) Normal None None None _______ Normal Normal Normal Normal  L. Lumbar paraspinals (mid) Normal None None None _______ Normal Normal Normal Normal  L. First dorsal interosseous Normal None None None _______ Normal Normal Normal Normal  L. Extensor digitorum communis Normal None None None _______ Normal Normal Normal Normal  L. Biceps brachii Normal None None None _______ Normal Normal Normal Normal  L. Deltoid Normal None None None _______ Normal Normal Normal Normal  L. Triceps brachii Normal None None None _______ Normal Normal Normal Normal  L. Cervical paraspinals Normal None None None _______ Normal Normal Normal Normal

## 2023-08-09 NOTE — Progress Notes (Signed)
No chief complaint on file.     ASSESSMENT AND PLAN  Joe Greene is a 72 y.o. male   Chronic inflammatory demyelinating polyradiculoneuropathy  Started since early November 2023 following upper respiratory infection, loss of taste 2 weeks prior to the symptom onset  mild to moderate improvement following IVIG from November 14-18 2023, began to have worsening symptoms again since early December 2023, hospital admission again on Dec 14 for plasma Change, did help his symptoms, following that, at his best, he can ambulate without assistance, Hospital admission again on January 4th 2024, IVIG 2 g/kg completed on January 8, Out patient IVIG, 2 g/kg loading dose on the week of December 10, 2022, then 1 g/kg every 3 weeks,  has made significant improvement, gait back to baseline, but recent few months, he complains of worsening intermittent lower extremity paresthesia, no limitation in his daily activity, on Cymbalta, relatively low-dose of gabapentin Keep Cymbalta 60 mg daily, higher dose of gabapentin may take up to 300 mg 3 tablets 3 times a day as needed  Continue IVIG 1g/kg every 3 weeks  He never had issue with previously yearly flu vaccination, diagnosis CIDP, rare chance of its association with vaccine, decided not skip this year.   Return To Clinic With NP In 6 Months     DIAGNOSTIC DATA (LABS, IMAGING, TESTING) - I reviewed patient records, labs, notes, testing and imaging myself where available.     MEDICAL HISTORY:  Joe Greene, is a 72 year old male seen in request by his primary care physician Dr. 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 PMHX HTN Smoker, 2ppd, quit since Sep 23 2022.  Patient has been active all his life, began to noticed toes and fingertips numbness tingling on September 17, 2022, prior to that, he has suffered  upper respiratory infection for 2 weeks, cough, loss of taste,  By November 10, he noticed progressive numbness upper and lower extremity, and also developed gait abnormality,  Symptoms continue to progressively getting worse, leading to emergency room presentation September 24, 2022, also had intractable headache,  He was admitted with diagnosis of probable Guillain-Barr, lumbar puncture September 25, 2022 showed wbc, 4, RBC 0,  TP 89, Glucose 80,   He had 5 days of IVIG, began to notice improvement shortly afterwards, continue to make progress, to the point he can ambulate without assistance at home for few days, but since early December, he noticed regression, increased numbness, gait abnormality,   Update October 24, 2022: Patient returns for electrodiagnostic study today, which confirmed acute demyelinating polyradiculoneuropathy, there was no evidence of significant axonal loss He had symptom onset early November, received IVIG from November 14 for 5 consecutive days, he began to notice an after second and third dose, the benefit lasted about 2 weeks, at his best, he could take few steps at home without assistant,  Since beginning of December 2023, 2 weeks after IVIG infusion, he began to notice worsening bilateral lower extremity ascending numbness, weakness, now could barely take a step, sitting in wheelchair most of the time, also frequent shortness of breath episode, sometimes waking up in the middle of the night  Also complains of difficulty emptying his bladder, urinary frequency, baseline elevated heart rate, consistent with autonomic involvement  We have attempted outpatient IVIG infusion, but due to insurance and preauthorization limitations, it needed few weeks to complete the process.  With his worsening symptoms, especially autonomic involvement, shortness of breath, continue progress of symptoms, per patient even worse than prior to treatment level in the middle of November, will  refer him to hospital for either repeat IVIG or even consider plasma exchange  UPDATE Nov 21 2022: He was admitted to hospital following his electrodiagnostic study because of his complaints of worsening ascending paresthesia, gait abnormality  Central line placed on 12/14 he was started on plasma exchange, 5 rounds, every other day, with improvement in symptoms.  He completed  NIF-32 and vital capacity 2.7 L.  Then was discharged to inpatient rehabilitation on November 02, 2022, which continues improvement, at his best, he can ambulate without assistance  Shortly after he was discharged home on November 10, 2022, he noticed regression, hospital admission again on January 4th, IVIG 2 g/kg Completed on January 8th 2023, this round, he only noticed mild improvement  Discharge neurological examination showed normal upper extremity strength, mild distal upper extremity, hip flexion, distal leg weakness,  Patient complains of continued lower extremity paresthesia, weakness, gait abnormality, fingertips paresthesia, loss of dexterity of his fingers  He denies swallowing difficulty, breathing difficulty, no significant autonomic symptoms  UPDATE Dec 11 2022 Dr. Terrace Arabia: Patient is accompanied by his wife at today's clinical visit, continue worsening lower extremity weakness, increased gait abnormality about 2 to 3 weeks following previous IVIG infusion, started outpatient IVIG loading 2 g/kg since December 10, 2021, tolerating infusion well  He complains of slow worsening low back pain, radiating pain to left hip, lateral thigh, paresthesia, increased after bearing weight, feels very discouraged that he has recurrent symptoms even with frequent IVIG treatment, also complains of intermittent upper extremity paresthesia, extending to above wrist level  UPDATE Feb 202 24   He is accompanied his wife at today's visit, overall doing better, continue have low back pain radiating pain to left hip, intermittent  bilateral lower extremity paresthesia, unsteady gait uses cane, but overall has much improved, IVIG every 3 weeks has been helpful, last infusion was on February 19 and 20  Personally reviewed MRI of lumbar December 29, 2022, multilevel degenerative changes, prominent disc and facet degenerative disease most prominent at L4-5, severe bilateral foraminal narrowing, L3-4 moderate bilateral foraminal narrowing  He denies bowel or bladder incontinence, intermittent bilateral hands paresthesia  Left hip x-ray showed no significant abnormality  He tolerated Cymbalta 30 mg,  UPDATE Sept 27th 2024: He is overall doing very well, gait back to his baseline, continued IVIG 1 g/kg body recent few weeks, he has intermittent worsening bilateral feet numbness tingling, especially after bearing weight, taking Cymbalta 60 mg every morning gabapentin 300 mg 3 to 4 tablets each day, complains of difficulty sleeping sometimes,   PHYSICAL EXAM:      08/09/2023   10:04 AM 06/04/2023    1:01 PM 05/22/2023    3:07 PM  Vitals with BMI  Height 5\' 9"  5\' 9"  5\' 9"   Weight 210 lbs 210 lbs 213 lbs 3 oz  BMI 31 31 31.47  Systolic 132 122 161  Diastolic 81 73 66  Pulse 70 70 89      PHYSICAL EXAMNIATION:  Gen: NAD, pleasant elderly Caucasian male, conversant, well nourised, well groomed                     Cardiovascular: Regular rate rhythm, no peripheral edema, warm, nontender. Eyes: Conjunctivae clear without exudates or hemorrhage Neck: Supple, no carotid bruits. Pulmonary: Clear to auscultation bilaterally  NEUROLOGICAL EXAM:  MENTAL STATUS: Speech/cognition: Awake, alert, oriented to history taking and casual conversation, normal breathing CRANIAL NERVES: CN II: Visual fields are full to confrontation. Pupils are round equal and briskly reactive to light. CN III, IV, VI: extraocular movement are normal. No ptosis. CN V: Facial sensation is intact to light touch CN VII: Face is symmetric with  normal eye closure  CN VIII: Hearing is normal to causal conversation. CN IX, X: Phonation is normal. CN XI: Head turning and shoulder shrug are intact  MOTOR: He has no significant bilateral upper and lower extremity muscle weakness   REFLEXES: Present bilateral brachial radialis, and triceps reflex, absent at patella and ankles  SENSORY:  Length-dependent decreased light touch, pinprick sensation to midfoot level; vibratory sensation to ankle level; decreased toe proprioception Decreased finger vibratory sensation, preserved finger proprioception,    COORDINATION: There is no trunk or limb dysmetria noted.  GAIT/STANCE: Steady gait, mild positive Romberg signs  REVIEW OF SYSTEMS:  Full 14 system review of systems performed and notable only for as above All other review of systems were negative.   ALLERGIES: No Known Allergies  HOME MEDICATIONS: Current Outpatient Medications  Medication Sig Dispense Refill   acetaminophen (TYLENOL) 325 MG tablet Take 1-2 tablets (325-650 mg total) by mouth every 4 (four) hours as needed for mild pain.     aspirin EC 81 MG tablet Take 81 mg by mouth in the morning.     DULoxetine HCl 40 MG CSDR Take 40 mg by mouth daily. 30 capsule 5   Fluocinonide (LIDEX EX) Apply 1 application  topically daily as needed (rash).     gabapentin (NEURONTIN) 300 MG capsule Take 1 capsule (300 mg total) by mouth 3 (three) times daily. (Patient taking differently: Take 300 mg by mouth 2 (two) times daily.) 90 capsule 11   ibuprofen (ADVIL) 200 MG tablet Take 400 mg by mouth daily as needed for headache or mild pain.     immune globulin,gamma, IGG, (BAYGAM) injection Inject into the muscle.     Omega-3 Fatty Acids (FISH OIL PO) Take 1 capsule by mouth daily with lunch. When able to remember     rosuvastatin (CRESTOR) 5 MG tablet Take 1 tablet (5 mg total) by mouth daily. 90 tablet 3   sildenafil (VIAGRA) 100 MG tablet TAKE 1/2 TO 1 TABLET(50 TO 100 MG) BY MOUTH DAILY  AS NEEDED FOR ERECTILE DYSFUNCTION 10 tablet 5   triamcinolone cream (KENALOG) 0.1 % Apply 1 Application topically 2 (two) times daily. 30 g 0   No current facility-administered medications for this visit.    PAST MEDICAL HISTORY: Past Medical History:  Diagnosis Date   AAA (abdominal aortic aneurysm) (HCC)    Cataract    forming    Guillain Barr syndrome (HCC)     PAST SURGICAL HISTORY: Past Surgical History:  Procedure Laterality Date   FOOT SURGERY Right    HERNIA REPAIR Right    IR FLUORO GUIDE CV LINE RIGHT  10/25/2022   IR US GUIDE VASC ACCESS RIGHT  10/25/2022   KNEE ARTHROSCOPY Left    WISDOM TOOTH EXTRACTION      FAMILY HISTORY: Family History  Problem Relation Age of Onset   Stroke Mother 92   AAA (abdominal aortic aneurysm) Mother    Cancer Father        Prostate   Colon cancer Neg Hx    Colon polyps Neg Hx    Esophageal cancer Neg Hx    Rectal  cancer Neg Hx    Stomach cancer Neg Hx     SOCIAL HISTORY: Social History   Socioeconomic History   Marital status: Married    Spouse name: Matt Delpizzo   Number of children: 2   Years of education: Not on file   Highest education level: Doctorate  Occupational History   Occupation: Retired    Comment: Scientist, research (medical)- [employer]  Tobacco Use   Smoking status: Former    Current packs/day: 0.00    Average packs/day: 2.0 packs/day for 50.0 years (100.0 ttl pk-yrs)    Types: Cigarettes    Start date: 09/18/1972    Quit date: 09/18/2022    Years since quitting: 0.8   Smokeless tobacco: Never  Vaping Use   Vaping status: Never Used  Substance and Sexual Activity   Alcohol use: Yes    Alcohol/week: 3.0 standard drinks of alcohol    Types: 3 Standard drinks or equivalent per week    Comment: 2 or 3 drinks per week   Drug use: Never   Sexual activity: Yes  Other Topics Concern   Not on file  Social History Narrative   Not on file   Social Determinants of Health   Financial Resource Strain: Low  Risk  (02/16/2023)   Overall Financial Resource Strain (CARDIA)    Difficulty of Paying Living Expenses: Not hard at all  Food Insecurity: No Food Insecurity (02/16/2023)   Hunger Vital Sign    Worried About Running Out of Food in the Last Year: Never true    Ran Out of Food in the Last Year: Never true  Transportation Needs: No Transportation Needs (02/16/2023)   PRAPARE - Administrator, Civil Service (Medical): No    Lack of Transportation (Non-Medical): No  Physical Activity: Unknown (02/16/2023)   Exercise Vital Sign    Days of Exercise per Week: 5 days    Minutes of Exercise per Session: Patient declined  Recent Concern: Physical Activity - Inactive (12/17/2022)   Exercise Vital Sign    Days of Exercise per Week: 0 days    Minutes of Exercise per Session: 0 min  Stress: No Stress Concern Present (02/16/2023)   [employer] of Occupational Health - Occupational Stress Questionnaire    Feeling of Stress : Not at all  Social Connections: Socially Integrated (02/16/2023)   Social Connection and Isolation Panel [NHANES]    Frequency of Communication with Friends and Family: More than three times a week    Frequency of Social Gatherings with Friends and Family: Once a week    Attends Religious Services: More than 4 times per year    Active Member of [employer] or Organizations: Yes    Attends Banker Meetings: More than 4 times per year    Marital Status: Married  Catering manager Violence: Not At Risk (11/18/2022)   Humiliation, Afraid, Rape, and Kick questionnaire    Fear of Current or Ex-Partner: No    Emotionally Abused: No    Physically Abused: No    Sexually Abused: No     Levert Feinstein, M.D. Ph.D.  [facility] [address] [place], [place] [address] Phone: [phone] Fax:      [phone]

## 2023-08-19 ENCOUNTER — Ambulatory Visit: Payer: Medicare PPO | Admitting: Family Medicine

## 2023-08-19 ENCOUNTER — Encounter: Payer: Self-pay | Admitting: Family Medicine

## 2023-08-19 VITALS — BP 130/70 | HR 63 | Temp 98.3°F | Ht 69.0 in | Wt 223.2 lb

## 2023-08-19 DIAGNOSIS — I10 Essential (primary) hypertension: Secondary | ICD-10-CM | POA: Diagnosis not present

## 2023-08-19 DIAGNOSIS — G610 Guillain-Barre syndrome: Secondary | ICD-10-CM

## 2023-08-19 DIAGNOSIS — I7143 Infrarenal abdominal aortic aneurysm, without rupture: Secondary | ICD-10-CM

## 2023-08-19 DIAGNOSIS — F17201 Nicotine dependence, unspecified, in remission: Secondary | ICD-10-CM | POA: Diagnosis not present

## 2023-08-19 DIAGNOSIS — M65312 Trigger thumb, left thumb: Secondary | ICD-10-CM | POA: Diagnosis not present

## 2023-08-19 NOTE — Assessment & Plan Note (Signed)
Continue to follow with neurology. Continue IVIG infusions. Patient asked about influenza vaccination. I recommended he not be vaccinated due to increased risk for recurrent GBS.

## 2023-08-19 NOTE — Assessment & Plan Note (Signed)
Blood pressure is in good control. Smoking cessation appears to have resolved this issue.

## 2023-08-19 NOTE — Progress Notes (Signed)
[facility] [address] [place] [address] Dept: [phone] Dept Fax: [phone]  Chronic Care Office Visit  Subjective:    Patient ID: Joe Greene, male    DOB: Oct 29, 1951, 72 y.o..   MRN: [id number]  Chief Complaint  Patient presents with   Follow-up    3 month f/u.    History of Present Illness:  Patient is in today for reassessment of chronic medical issues.  Joe Greene has a history of chronic inflammatory demyelinating polyneuropathy (Guillain-Barr syndrome). This condition started for him initially on Nov. 9th, following a URI about 2 weeks earlier. He has had multiple rounds of IVIG treatment.  His notes his strength is much better than last winter, though he is still not back to baseline. Joe Greene is also treated with gabapentin 600 mg BID and duloxetine 60 mg daily for neuropathic  symptoms.    Joe Greene has a history of hypertension. He notes he did have a relapse of smoking, but is now back to being abstinent. He is not on medication currently for BP control.    Joe Greene has a history of an abdominal aortic aneurysm. He is having a CT scan every 6 months to monitor this. He was started on rosuvastatin 5 mg earlier this year.  Joe Greene notes a pain in the left thumb over the past month. He is noting popping with movement of the joint. He finds he cannot grip like he used to.  Past Medical History: Patient Active Problem List   Diagnosis Date Noted   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HCC) 08/09/2023   Dermatitis 02/20/2023   Lumbar radiculopathy 01/01/2023   Left-sided low back pain with left-sided sciatica 12/11/2022   CIDP (chronic inflammatory demyelinating polyneuropathy) (HCC) 12/11/2022   Acute sensory neuropathy 10/25/2022   B12 deficiency 10/18/2022   Gait abnormality 10/18/2022   Pre-diabetes 09/25/2022   AAA (abdominal aortic aneurysm) (HCC) 09/24/2022   Essential hypertension 09/24/2022   Iron  deficiency 04/04/2022   Hypertriglyceridemia 04/03/2022   Coronary artery calcification 01/18/2022   Candidal intertrigo    Emphysema of lung (HCC) 07/26/2021   Aortic atherosclerosis (HCC) 07/26/2021   Internal hemorrhoids 01/23/2021   Diverticula of colon 01/23/2021   Eczema 01/23/2021   Actinic keratoses 01/23/2021   Tobacco use disorder, moderate, in early remission 09/02/2020   Past Surgical History:  Procedure Laterality Date   FOOT SURGERY Right    HERNIA REPAIR Right    IR FLUORO GUIDE CV LINE RIGHT  10/25/2022   IR US GUIDE VASC ACCESS RIGHT  10/25/2022   KNEE ARTHROSCOPY Left    WISDOM TOOTH EXTRACTION     Family History  Problem Relation Age of Onset   Stroke Mother 68   AAA (abdominal aortic aneurysm) Mother    Cancer Father        Prostate   Colon cancer Neg Hx    Colon polyps Neg Hx    Esophageal cancer Neg Hx    Rectal cancer Neg Hx    Stomach cancer Neg Hx    Outpatient Medications Prior to Visit  Medication Sig Dispense Refill   acetaminophen (TYLENOL) 325 MG tablet Take 1-2 tablets (325-650 mg total) by mouth every 4 (four) hours as needed for mild pain.     aspirin EC 81 MG tablet Take 81 mg by mouth in the morning.     DULoxetine (CYMBALTA) 60 MG capsule Take 1 capsule (60 mg total) by mouth daily. 90 capsule 3  Fluocinonide (LIDEX EX) Apply 1 application  topically daily as needed (rash).     gabapentin (NEURONTIN) 300 MG capsule Take 3 capsules (900 mg total) by mouth 3 (three) times daily. (Patient taking differently: Take 900 mg by mouth 3 (three) times daily. 2 capsule 3 times daily) 270 capsule 11   ibuprofen (ADVIL) 200 MG tablet Take 400 mg by mouth daily as needed for headache or mild pain.     immune globulin,gamma, IGG, (BAYGAM) injection Inject into the muscle.     Omega-3 Fatty Acids (FISH OIL PO) Take 1 capsule by mouth daily with lunch. When able to remember     rosuvastatin (CRESTOR) 5 MG tablet Take 1 tablet (5 mg total) by mouth daily.  90 tablet 3   sildenafil (VIAGRA) 100 MG tablet TAKE 1/2 TO 1 TABLET(50 TO 100 MG) BY MOUTH DAILY AS NEEDED FOR ERECTILE DYSFUNCTION 10 tablet 5   triamcinolone cream (KENALOG) 0.1 % Apply 1 Application topically 2 (two) times daily. 30 g 0   No facility-administered medications prior to visit.   No Known Allergies Objective:   Today's Vitals   08/19/23 0820  BP: 130/70  Pulse: 63  Temp: 98.3 F (36.8 C)  TempSrc: Temporal  SpO2: 97%  Weight: 223 lb 3.2 oz (101.2 kg)  Height: 5\' 9"  (1.753 m)   Body mass index is 32.96 kg/m.   General: Well developed, well nourished. No acute distress. HEENT: Normocephalic, non-traumatic. External ears normal. EAC and TMs normal bilaterally.  Extremities: Full ROM. There is a popping of the joint that occurs with flexion/extension of the the left 1st IP   joint. No joint swelling. Mild tenderness of the IP and 1st MCP joint. No edema noted. Psych: Alert and oriented. Normal mood and affect.  Health Maintenance Due  Topic Date Due   Hepatitis C Screening  Never done     Assessment & Plan:   Problem List Items Addressed This Visit       Cardiovascular and Mediastinum   AAA (abdominal aortic aneurysm) (HCC) - Primary (Chronic)    Plan to repeat CT scan in mid-Nov.      Relevant Orders   CT ABDOMEN PELVIS WO CONTRAST   Essential hypertension    Blood pressure is in good control. Smoking cessation appears to have resolved this issue.        Nervous and Auditory   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HCC)    Continue to follow with neurology. Continue IVIG infusions. Patient asked about influenza vaccination. I recommended he not be vaccinated due to increased risk for recurrent GBS.        Other   Tobacco use disorder, moderate, in early remission    Encouraged him to maintain abstinence, esp. in light of arterial disease and hypertension.      Other Visit Diagnoses     Trigger finger of left thumb       I will refer to  orthopedics to evalaute and consider a steroid injection.   Relevant Orders   Ambulatory referral to Orthopedics       Return in about 6 months (around 02/17/2024).   Loyola Mast, MD

## 2023-08-19 NOTE — Assessment & Plan Note (Signed)
Encouraged him to maintain abstinence, esp. in light of arterial disease and hypertension.

## 2023-08-19 NOTE — Assessment & Plan Note (Signed)
Plan to repeat CT scan in mid-Nov.

## 2023-08-20 DIAGNOSIS — G610 Guillain-Barre syndrome: Secondary | ICD-10-CM | POA: Diagnosis not present

## 2023-09-02 ENCOUNTER — Ambulatory Visit
Admission: RE | Admit: 2023-09-02 | Discharge: 2023-09-02 | Disposition: A | Discharge status: Home / Self Care | Payer: Medicare PPO | Source: Ambulatory Visit | Attending: Family Medicine | Admitting: Family Medicine

## 2023-09-02 DIAGNOSIS — I700 Atherosclerosis of aorta: Secondary | ICD-10-CM | POA: Diagnosis not present

## 2023-09-02 DIAGNOSIS — R160 Hepatomegaly, not elsewhere classified: Secondary | ICD-10-CM | POA: Diagnosis not present

## 2023-09-02 DIAGNOSIS — I7143 Infrarenal abdominal aortic aneurysm, without rupture: Secondary | ICD-10-CM

## 2023-09-10 DIAGNOSIS — G610 Guillain-Barre syndrome: Secondary | ICD-10-CM | POA: Diagnosis not present

## 2023-09-11 DIAGNOSIS — G610 Guillain-Barre syndrome: Secondary | ICD-10-CM | POA: Diagnosis not present

## 2023-10-01 ENCOUNTER — Encounter: Payer: Self-pay | Admitting: Family Medicine

## 2023-10-01 DIAGNOSIS — G610 Guillain-Barre syndrome: Secondary | ICD-10-CM | POA: Diagnosis not present

## 2023-10-02 DIAGNOSIS — G610 Guillain-Barre syndrome: Secondary | ICD-10-CM | POA: Diagnosis not present

## 2023-10-04 ENCOUNTER — Encounter: Payer: Self-pay | Admitting: Family Medicine

## 2023-10-04 ENCOUNTER — Ambulatory Visit: Payer: Medicare PPO | Admitting: Family Medicine

## 2023-10-04 VITALS — BP 148/80 | HR 74 | Temp 98.0°F | Wt 216.0 lb

## 2023-10-04 DIAGNOSIS — E785 Hyperlipidemia, unspecified: Secondary | ICD-10-CM | POA: Diagnosis not present

## 2023-10-04 DIAGNOSIS — I2510 Atherosclerotic heart disease of native coronary artery without angina pectoris: Secondary | ICD-10-CM

## 2023-10-04 DIAGNOSIS — I7143 Infrarenal abdominal aortic aneurysm, without rupture: Secondary | ICD-10-CM

## 2023-10-04 DIAGNOSIS — I10 Essential (primary) hypertension: Secondary | ICD-10-CM | POA: Diagnosis not present

## 2023-10-04 LAB — URINALYSIS, ROUTINE W REFLEX MICROSCOPIC
Bilirubin Urine: NEGATIVE
Hgb urine dipstick: NEGATIVE
Ketones, ur: NEGATIVE
Leukocytes,Ua: NEGATIVE
Nitrite: NEGATIVE
RBC / HPF: NONE SEEN (ref 0–?)
Specific Gravity, Urine: 1.025 (ref 1.000–1.030)
Total Protein, Urine: 30 — AB
Urine Glucose: NEGATIVE
Urobilinogen, UA: 0.2 (ref 0.0–1.0)
pH: 6 (ref 5.0–8.0)

## 2023-10-04 LAB — BASIC METABOLIC PANEL
BUN: 20 mg/dL (ref 6–23)
CO2: 28 meq/L (ref 19–32)
Calcium: 10 mg/dL (ref 8.4–10.5)
Chloride: 99 meq/L (ref 96–112)
Creatinine, Ser: 1.03 mg/dL (ref 0.40–1.50)
GFR: 72.41 mL/min (ref >60.00)
Glucose, Bld: 100 mg/dL — ABNORMAL HIGH (ref 70–99)
Potassium: 4.7 meq/L (ref 3.5–5.1)
Sodium: 134 meq/L — ABNORMAL LOW (ref 135–145)

## 2023-10-04 LAB — MICROALBUMIN / CREATININE URINE RATIO
Creatinine,U: 182 mg/dL
Microalb Creat Ratio: 17.3 mg/g (ref 0.0–30.0)
Microalb, Ur: 31.5 mg/dL — ABNORMAL HIGH (ref 0.0–1.9)

## 2023-10-04 MED ORDER — OLMESARTAN-AMLODIPINE-HCTZ 20-5-12.5 MG PO TABS
1.0000 | ORAL_TABLET | Freq: Every day | ORAL | 0 refills | Status: DC
Start: 2023-10-04 — End: 2023-11-01

## 2023-10-04 NOTE — Progress Notes (Unsigned)
Assessment/Plan:   Problem List Items Addressed This Visit       Cardiovascular and Mediastinum   AAA (abdominal aortic aneurysm) (HCC) - Primary (Chronic)   Relevant Medications   Olmesartan-amLODIPine-HCTZ 20-5-12.5 MG TABS   Coronary artery calcification   Relevant Medications   Olmesartan-amLODIPine-HCTZ 20-5-12.5 MG TABS   Essential hypertension   Relevant Medications   Olmesartan-amLODIPine-HCTZ 20-5-12.5 MG TABS   Other Relevant Orders   Basic Metabolic Panel (BMET)   Urinalysis, Routine w reflex microscopic   Microalbumin / creatinine urine ratio   Basic Metabolic Panel (BMET)    There are no discontinued medications.  Return in about 4 weeks (around 11/01/2023) for BP.    Subjective:   Encounter date: 10/04/2023  Joe Greene is a 72 y.o. male who has Tobacco use disorder, moderate, in early remission; Internal hemorrhoids; Diverticula of colon; Eczema; Actinic keratoses; Emphysema of lung (HCC); Aortic atherosclerosis (HCC); Candidal intertrigo; Coronary artery calcification; Hypertriglyceridemia; Iron deficiency; AAA (abdominal aortic aneurysm) (HCC); Essential hypertension; Pre-diabetes; B12 deficiency; Gait abnormality; Acute sensory neuropathy; Left-sided low back pain with left-sided sciatica; CIDP (chronic inflammatory demyelinating polyneuropathy) (HCC); Lumbar radiculopathy; Dermatitis; and 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HCC) on their problem list..   He  has a past medical history of AAA (abdominal aortic aneurysm) (HCC), Cataract, and Guillain Barr syndrome (HCC).Marland Kitchen   He presents with chief complaint of blood pressure (Elevated b/p For the past 2-3 weeks the upper number has been running 150-155.) .   HPI:   ROS  Past Surgical History:  Procedure Laterality Date   FOOT SURGERY Right    HERNIA REPAIR Right    IR FLUORO GUIDE CV LINE RIGHT  10/25/2022   IR US GUIDE VASC ACCESS RIGHT  10/25/2022   KNEE ARTHROSCOPY Left    WISDOM TOOTH EXTRACTION       Outpatient Medications Prior to Visit  Medication Sig Dispense Refill   acetaminophen (TYLENOL) 325 MG tablet Take 1-2 tablets (325-650 mg total) by mouth every 4 (four) hours as needed for mild pain.     aspirin EC 81 MG tablet Take 81 mg by mouth in the morning.     DULoxetine (CYMBALTA) 60 MG capsule Take 1 capsule (60 mg total) by mouth daily. 90 capsule 3   Fluocinonide (LIDEX EX) Apply 1 application  topically daily as needed (rash).     gabapentin (NEURONTIN) 300 MG capsule Take 3 capsules (900 mg total) by mouth 3 (three) times daily. (Patient taking differently: Take 900 mg by mouth 3 (three) times daily. 2 capsule 2 times daily) 270 capsule 11   ibuprofen (ADVIL) 200 MG tablet Take 400 mg by mouth daily as needed for headache or mild pain.     immune globulin,gamma, IGG, (BAYGAM) injection Inject into the muscle.     Omega-3 Fatty Acids (FISH OIL PO) Take 1 capsule by mouth daily with lunch. When able to remember     rosuvastatin (CRESTOR) 5 MG tablet Take 1 tablet (5 mg total) by mouth daily. 90 tablet 3   sildenafil (VIAGRA) 100 MG tablet TAKE 1/2 TO 1 TABLET(50 TO 100 MG) BY MOUTH DAILY AS NEEDED FOR ERECTILE DYSFUNCTION 10 tablet 5   triamcinolone cream (KENALOG) 0.1 % Apply 1 Application topically 2 (two) times daily. 30 g 0   No facility-administered medications prior to visit.    Family History  Problem Relation Age of Onset   Stroke Mother 28   AAA (abdominal aortic aneurysm) Mother    Cancer Father  Prostate   Colon cancer Neg Hx    Colon polyps Neg Hx    Esophageal cancer Neg Hx    Rectal cancer Neg Hx    Stomach cancer Neg Hx     Social History   Socioeconomic History   Marital status: Married    Spouse name: Olukayode Nuzum   Number of children: 2   Years of education: Not on file   Highest education level: Doctorate  Occupational History   Occupation: Retired    Comment: Scientist, research (medical)- [employer]  Tobacco Use   Smoking status: Former     Current packs/day: 0.00    Average packs/day: 2.0 packs/day for 50.0 years (100.0 ttl pk-yrs)    Types: Cigarettes    Start date: 09/18/1972    Quit date: 09/18/2022    Years since quitting: 1.0   Smokeless tobacco: Never  Vaping Use   Vaping status: Never Used  Substance and Sexual Activity   Alcohol use: Yes    Alcohol/week: 3.0 standard drinks of alcohol    Types: 3 Standard drinks or equivalent per week    Comment: 2 or 3 drinks per week   Drug use: Never   Sexual activity: Yes  Other Topics Concern   Not on file  Social History Narrative   Not on file   Social Determinants of Health   Financial Resource Strain: Low Risk  (10/01/2023)   Overall Financial Resource Strain (CARDIA)    Difficulty of Paying Living Expenses: Not hard at all  Food Insecurity: No Food Insecurity (10/01/2023)   Hunger Vital Sign    Worried About Running Out of Food in the Last Year: Never true    Ran Out of Food in the Last Year: Never true  Transportation Needs: No Transportation Needs (10/01/2023)   PRAPARE - Administrator, Civil Service (Medical): No    Lack of Transportation (Non-Medical): No  Physical Activity: Insufficiently Active (10/01/2023)   Exercise Vital Sign    Days of Exercise per Week: 5 days    Minutes of Exercise per Session: 20 min  Stress: No Stress Concern Present (10/01/2023)   [employer] of Occupational Health - Occupational Stress Questionnaire    Feeling of Stress : Not at all  Social Connections: Unknown (10/01/2023)   Social Connection and Isolation Panel [NHANES]    Frequency of Communication with Friends and Family: Once a week    Frequency of Social Gatherings with Friends and Family: Patient declined    Attends Religious Services: More than 4 times per year    Active Member of [employer] or Organizations: Yes    Attends Banker Meetings: More than 4 times per year    Marital Status: Married  Catering manager Violence: Not At Risk  (11/18/2022)   Humiliation, Afraid, Rape, and Kick questionnaire    Fear of Current or Ex-Partner: No    Emotionally Abused: No    Physically Abused: No    Sexually Abused: No  Objective:  Physical Exam: BP (!) 156/82 (BP Location: Right Arm, Patient Position: Sitting, Cuff Size: Large)   Pulse 74   Temp 98 F (36.7 C) (Temporal)   Wt 216 lb (98 kg)   SpO2 99%   BMI 31.90 kg/m     Physical Exam  CT ABDOMEN PELVIS WO CONTRAST  Result Date: 09/26/2023 CLINICAL DATA:  Abdominal aortic aneurysm.  Current smoker. EXAM: CT ABDOMEN AND PELVIS WITHOUT CONTRAST TECHNIQUE: Multidetector CT imaging of the abdomen and pelvis was performed following the standard protocol without IV contrast. RADIATION DOSE REDUCTION: This exam was performed according to the departmental dose-optimization program which includes automated exposure control, adjustment of the mA and/or kV according to patient size and/or use of iterative reconstruction technique. COMPARISON:  03/25/2023 and CT abdomen 09/24/2022. FINDINGS: Lower chest: No acute findings. Heart size normal. No pericardial effusion. Atherosclerotic calcification of the aorta and coronary arteries. No pleural effusion. Distal esophagus is grossly unremarkable. Hepatobiliary: Probable tiny hepatic cysts. No specific follow-up necessary. Liver is slightly enlarged, 18.2 cm. Gallbladder is unremarkable. No biliary ductal dilatation. Pancreas: Negative. Spleen: Negative. Adrenals/Urinary Tract: Adrenal glands and kidneys are unremarkable. Ureters are decompressed. Bladder is grossly unremarkable. Stomach/Bowel: Stomach, small bowel, appendix and colon are unremarkable. Vascular/Lymphatic: Atherosclerotic calcification of the aorta. Infrarenal aorta measures up to 5.0 cm, stable from 09/24/2022. Left common iliac artery measures up to 2.0 cm as before. No pathologically  enlarged lymph nodes. Reproductive: Prostate is visualized. Other: Small bilateral inguinal hernias contain fat. Mesenteries and peritoneum are unremarkable. Musculoskeletal: Degenerative changes in the spine. Minimal retrolisthesis of L2 on L3. IMPRESSION: 1. 5.0 cm infrarenal aortic aneurysm, stable. Recommend follow-up every 6 months and vascular consultation. This recommendation follows ACR consensus guidelines: White Paper of the ACR Incidental Findings Committee II on Vascular Findings. J Am Coll Radiol 2013; 10:789-794. 2. Mild hepatomegaly. 3. Aortic atherosclerosis (ICD10-I70.0). Coronary artery calcification. Electronically Signed   By: Leanna Battles M.D.   On: 09/26/2023 11:53   NCV with EMG(electromyography)  Result Date: 08/09/2023 Levert Feinstein, MD     08/09/2023 12:16 PM     Full Name: Romey Martensen Gender: Male MRN #: [id number] Date of Birth: [birth year]   Visit Date: 08/09/2023 10:12 Age: 34 Years Examining Physician: Dr. Levert Feinstein Referring Physician: Ihor Austin, NP Height: 5 feet 9 inch History: 72 year old male with history of CIDP since November 2023, gait abnormality, significant improvement after IVIG treatment, but the recent few months, noticed slight worsening of toes paresthesia Summary of the tests: Nerve conduction study: Left sural was within normal limit, bilateral superficial peroneal, right sural sensory response was absent.  Left median sensory response was absent.  Left ulnar sensory response showed moderately prolonged peak latency.  Left radial sensory response showed slightly decreased snap amplitude. Left median motor response showed moderately prolonged distal latency, mild slow conduction velocity, significantly prolonged F-wave latency. Left ulnar motor response also demonstrated moderately prolonged distal latency, mildly decreased CMAP amplitude, moderately prolonged F-wave latency Bilateral tibial motor responses were absent. Right peroneal to EDB motor responses showed  significantly prolonged distal latency with moderate to significantly decreased CMAP amplitude, Left peroneal to EDB motor response showed significantly prolonged distal latency normal CMAP amplitude, moderately slow conduction velocity Electromyography: Selected needle examinations of the right upper, lower extremity muscles, cervical and lumbosacral paraspinal muscles showed no significant abnormalities. Conclusion: This is an abnormal study.  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  Compared to previous study in December 2023, there is significant improvement. ------------------------------- Levert Feinstein, M.D.Ph.D. [facility] [address] [place], [place] [address] Tel: [phone] Fax: [phone] Verbal informed consent was obtained from the patient, patient was informed of potential risk of procedure, including bruising, bleeding, hematoma formation, infection, muscle weakness, muscle pain, numbness, among others.     MNC   Nerve / Sites Muscle Latency Ref. Amplitude Ref. Rel Amp Segments Distance Velocity Ref. Area   ms ms mV mV %  cm m/s m/s mVms L Median - APB    Wrist APB 5.8 <=4.4 7.4 >=4.0 100 Wrist - APB 7   27.9    Upper arm APB 11.0  7.0  94.2 Upper arm - Wrist 23 44 >=49 27.6 L Ulnar - ADM    Wrist ADM 4.7 <=3.3 5.3 >=6.0 100 Wrist - ADM 13   17.5    B.Elbow ADM 6.9  4.3  81.5 B.Elbow - Wrist 13 59 >=49 13.8    A.Elbow ADM 10.1  4.6  105 A.Elbow - B.Elbow 17 54 >=49 17.6 L Peroneal - EDB    Ankle EDB 8.5 <=6.5 3.9 >=2.0 100 Ankle - EDB 9   16.4    Fib head EDB 17.7  1.7  43.1 Fib head - Ankle 28 30 >=44 8.4    Pop fossa EDB 23.0  2.6  154 Pop fossa - Fib head 14 27 >=44 12.3        Pop fossa - Ankle     R Peroneal - EDB    Ankle EDB 9.5 <=6.5 1.2 >=2.0 100 Ankle - EDB 9   4.5    Fib head EDB 21.6  0.3  26.7 Fib head - Ankle 29 24 >=44 0.7    Pop fossa  EDB 23.8  0.9  273 Pop fossa - Fib head 11 50 >=44 4.6        Pop fossa - Ankle     L Tibial - AH    Ankle AH NR <=5.8 NR >=4.0 NR Ankle - AH 9   NR R Tibial - AH    Ankle AH NR <=5.8 NR >=4.0 NR Ankle - AH 9   NR               SNC   Nerve / Sites Rec. Site Peak Lat Ref.  Amp Ref. Segments Distance   ms ms V V  cm L Radial - Anatomical snuff box (Forearm)    Forearm Wrist 2.5 <=2.9 12 >=15 Forearm - Wrist 10 L Sural - Ankle (Calf)    Calf Ankle 3.3 <=4.4 7 >=6 Calf - Ankle 14 R Sural - Ankle (Calf)    Calf Ankle NR <=4.4 NR >=6 Calf - Ankle 14 L Superficial peroneal - Ankle    Lat leg Ankle NR <=4.4 NR >=6 Lat leg - Ankle 14 R Superficial peroneal - Ankle    Lat leg Ankle NR <=4.4 NR >=6 Lat leg - Ankle 14 L Median - Orthodromic (Dig II, Mid palm)    Dig II Wrist NR <=3.4 NR >=10 Dig II - Wrist 13 L Ulnar - Orthodromic, (Dig V, Mid palm)    Dig V Wrist 3.7 <=3.1 6 >=5 Dig V - Wrist 54                 F  Wave   Nerve F Lat Ref.  ms ms L Ulnar - ADM 34.5 <=32.0 L Median -  APB 39.3 <=31.0       EMG Summary Table   Spontaneous MUAP Recruitment Muscle IA Fib PSW Fasc Other Amp Dur. Poly Pattern L. Tibialis anterior Normal None None None _______ Normal Normal Normal Normal L. Tibialis posterior Normal None None None _______ Normal Normal Normal Normal L. Peroneus longus Normal None None None _______ Normal Normal Normal Normal L. Vastus lateralis Normal None None None _______ Normal Normal Normal Normal L. Lumbar paraspinals (low) Normal None None None _______ Normal Normal Normal Normal L. Lumbar paraspinals (mid) Normal None None None _______ Normal Normal Normal Normal L. First dorsal interosseous Normal None None None _______ Normal Normal Normal Normal L. Extensor digitorum communis Normal None None None _______ Normal Normal Normal Normal L. Biceps brachii Normal None None None _______ Normal Normal Normal Normal L. Deltoid Normal None None None _______ Normal Normal Normal Normal L. Triceps brachii Normal None  None None _______ Normal Normal Normal Normal L. Cervical paraspinals Normal None None None _______ Normal Normal Normal Normal     No results found for this or any previous visit (from the past 2160 hour(s)).      Garner Nash, MD, MS

## 2023-10-04 NOTE — Patient Instructions (Signed)
-   Begin taking your new blood pressure medication as prescribed: olmesartan/amlodipine/HCTZ 20?mg/5?mg/12.5?mg. - Complete the lab tests ordered today: basic metabolic panel (BMP), urine analysis, and microalbumin creatinine ratio. - Repeat the BMP in two weeks to monitor your kidney function while on the new medication. - Monitor and record your blood pressure at home daily, noting any significant changes. - Report any side effects or concerns immediately, and return to the clinic in one month for a follow-up appointment.

## 2023-10-05 DIAGNOSIS — E785 Hyperlipidemia, unspecified: Secondary | ICD-10-CM | POA: Insufficient documentation

## 2023-10-05 NOTE — Assessment & Plan Note (Signed)
Continue current therapy with rosuvastatin 5?mg daily. Encourage dietary modifications and lifestyle changes to improve lipid profile.

## 2023-10-05 NOTE — Assessment & Plan Note (Signed)
Worsening Plan: Initiate olmesartan/amlodipine/hydrochlorothiazide (20?mg/5?mg/12.5?mg) once daily to reduce blood pressure and cardiovascular risk. Order baseline laboratory tests : basic metabolic panel (BMP), urinalysis (UA), and microalbumin creatinine ratio to assess renal function and detect any end-organ damage. Repeat BMP in two weeks to monitor kidney function after starting the new medication. Instruct the patient to monitor and record blood pressure at home daily, noting any significant changes. Educate the patient about potential side effects of the medication and advise reporting any adverse effects promptly. Schedule a follow-up appointment in one month for blood pressure recheck and assessment of medication efficacy. Discuss return precautions, including signs of hypertensive urgency or emergency, and advise immediate reporting of severe symptoms.

## 2023-10-05 NOTE — Assessment & Plan Note (Signed)
Known 5?cm infrarenal abdominal aortic aneurysm, as measured by recent CT scan.   Plan: Continue monitoring the abdominal aortic aneurysm per vascular surgery recommendations. Advise the patient to report any new or worsening symptoms, such as sudden abdominal or back pain.

## 2023-10-21 ENCOUNTER — Telehealth: Payer: Self-pay | Admitting: Family Medicine

## 2023-10-21 DIAGNOSIS — I10 Essential (primary) hypertension: Secondary | ICD-10-CM

## 2023-10-21 NOTE — Telephone Encounter (Signed)
Order for BMP ordered after reviewing provider last OV note.  Dm/cma

## 2023-10-21 NOTE — Telephone Encounter (Signed)
I schedule pat a nurse visit for BMET let me know if it ok for tomorrow or do I need to call the pt and change the date

## 2023-10-21 NOTE — Addendum Note (Signed)
Addended by: Waymond Cera on: 10/21/2023 04:55 PM   Modules accepted: Orders

## 2023-10-22 ENCOUNTER — Other Ambulatory Visit (INDEPENDENT_AMBULATORY_CARE_PROVIDER_SITE_OTHER): Payer: Medicare PPO

## 2023-10-22 ENCOUNTER — Ambulatory Visit: Payer: Medicare PPO

## 2023-10-22 DIAGNOSIS — I10 Essential (primary) hypertension: Secondary | ICD-10-CM

## 2023-10-22 LAB — BASIC METABOLIC PANEL
BUN: 23 mg/dL (ref 6–23)
CO2: 27 meq/L (ref 19–32)
Calcium: 9.1 mg/dL (ref 8.4–10.5)
Chloride: 101 meq/L (ref 96–112)
Creatinine, Ser: 0.97 mg/dL (ref 0.40–1.50)
GFR: 77.79 mL/min (ref >60.00)
Glucose, Bld: 114 mg/dL — ABNORMAL HIGH (ref 70–99)
Potassium: 4.1 meq/L (ref 3.5–5.1)
Sodium: 136 meq/L (ref 135–145)

## 2023-10-23 ENCOUNTER — Ambulatory Visit: Payer: Medicare PPO

## 2023-10-23 DIAGNOSIS — G610 Guillain-Barre syndrome: Secondary | ICD-10-CM | POA: Diagnosis not present

## 2023-10-24 DIAGNOSIS — G610 Guillain-Barre syndrome: Secondary | ICD-10-CM | POA: Diagnosis not present

## 2023-10-29 IMAGING — CT CT CHEST LUNG CANCER SCREENING LOW DOSE W/O CM
2 of 5 series · 15 of 40 positions shown, 18 images · non-contrast
Comparison: 11/16/2020.

CLINICAL DATA: Current smoker, 99 pack-year history.



[Series 4: lung 1.00 br44 cor · coronal · 0.70mm/px · 3 of 350 slices shown]
[im 70/350  lung]
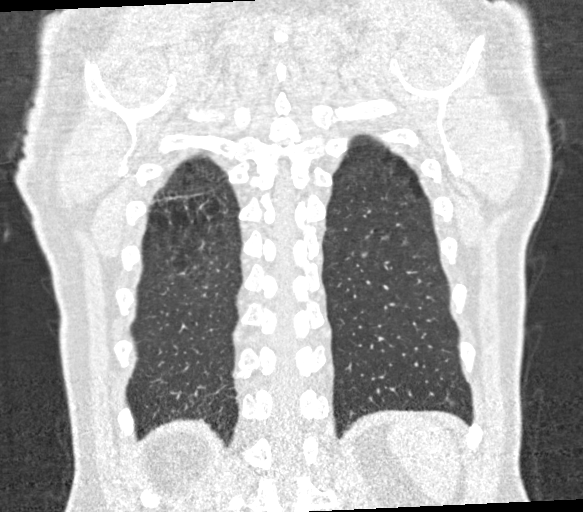
[im 140/350  lung]
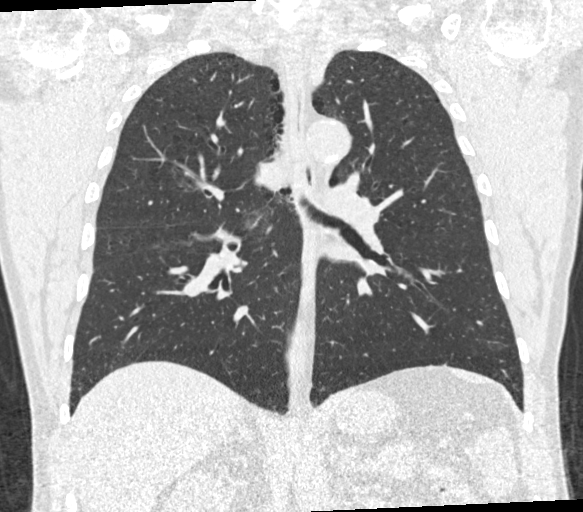
[im 210/350  lung]
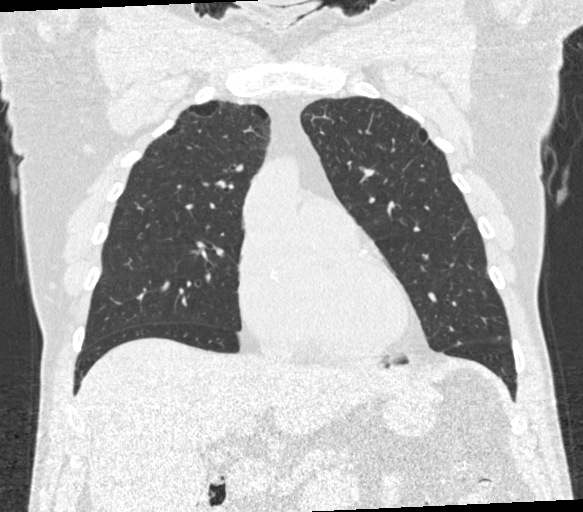

[Series 9: lung 1.00 br60 axial · axial · 0.80mm/px · z∈[-1066,-741]mm · 12 of 359 slices shown, 15 images]
[im 17/359  mediastinal]
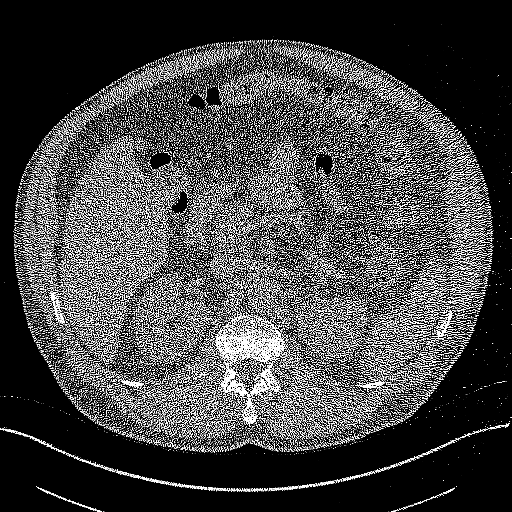
[im 17/359  lung]
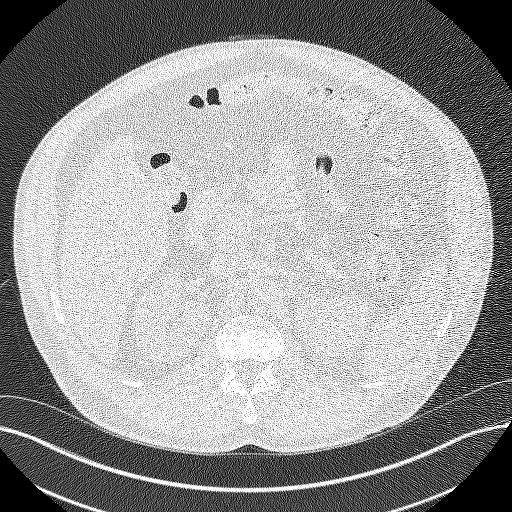
[im 49/359  lung]
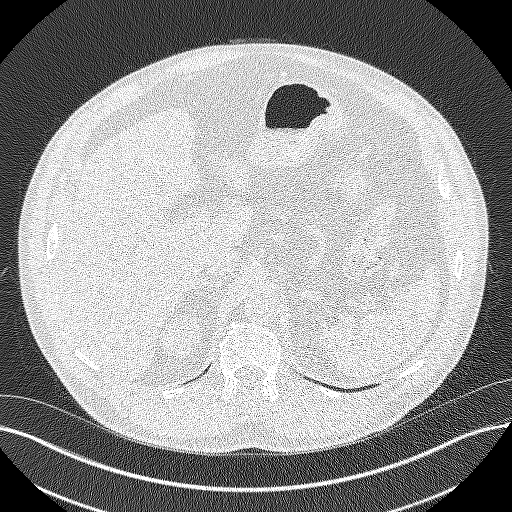
[im 82/359  lung]
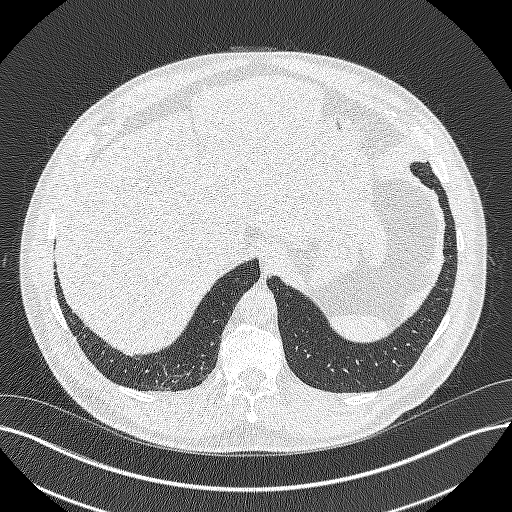
[im 114/359  lung]
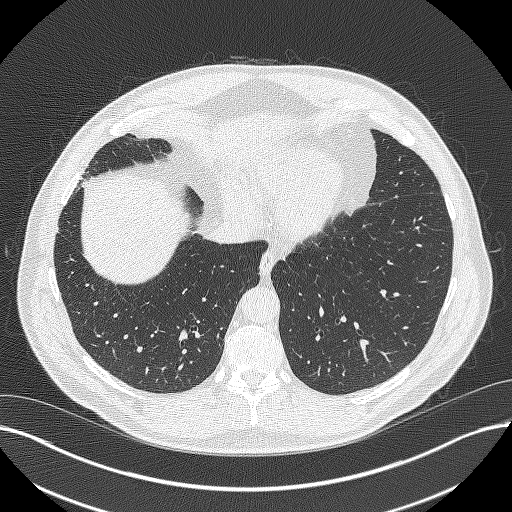
[im 131/359  mediastinal]
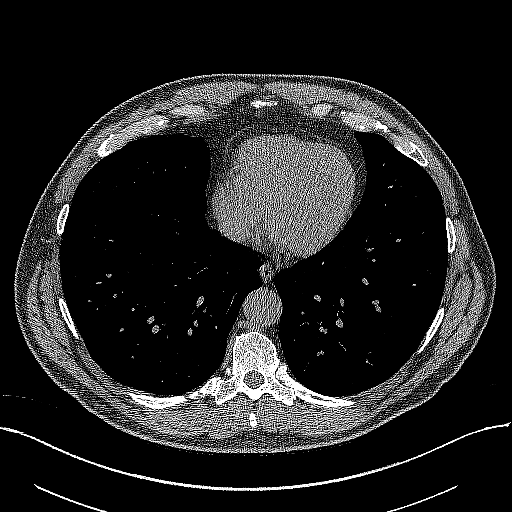
[im 131/359  lung]
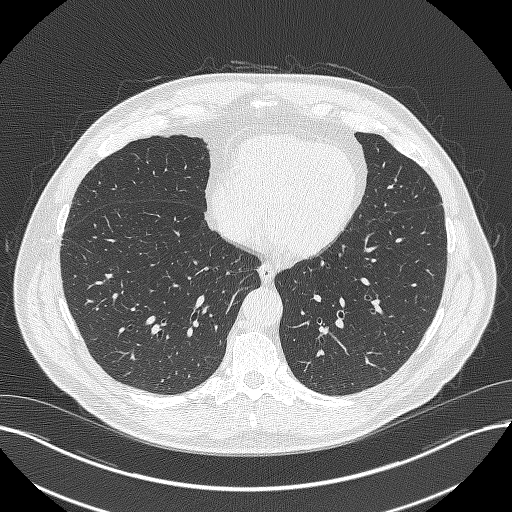
[im 163/359  lung]
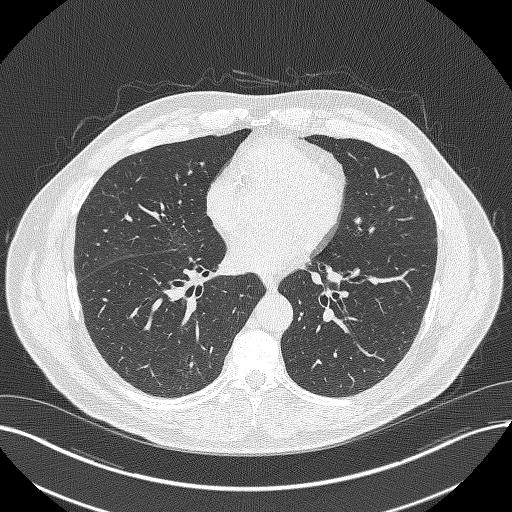
[im 196/359  lung]
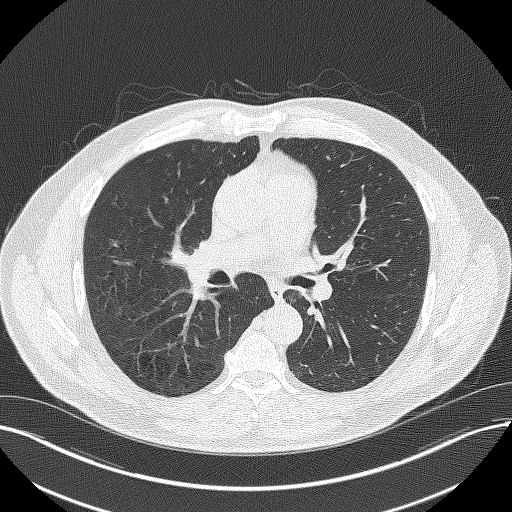
[im 228/359  lung]
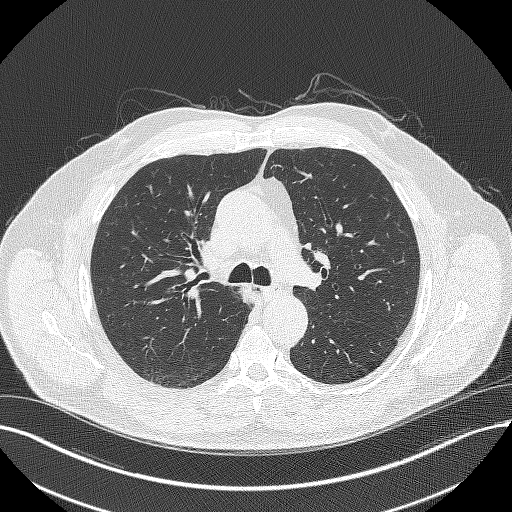
[im 245/359  mediastinal]
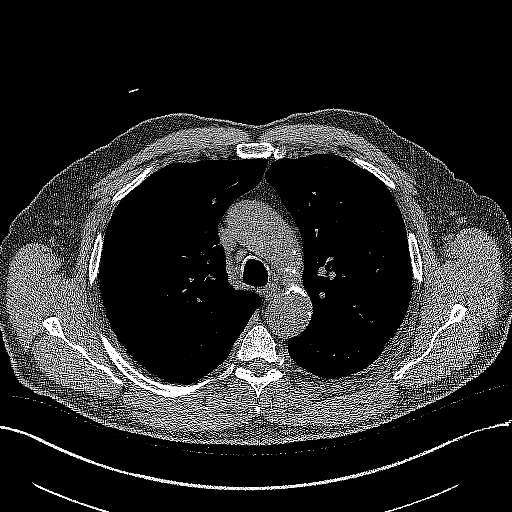
[im 245/359  lung]
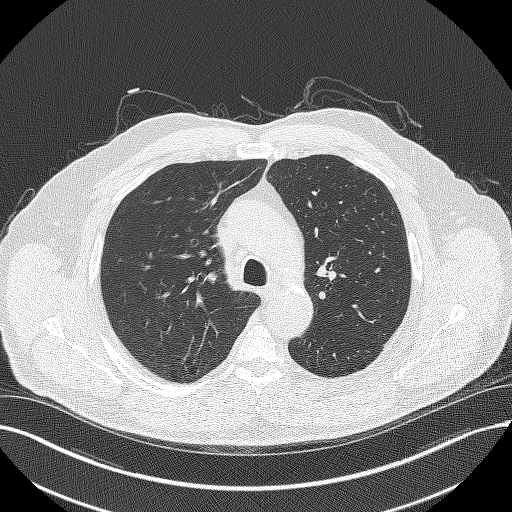
[im 277/359  lung]
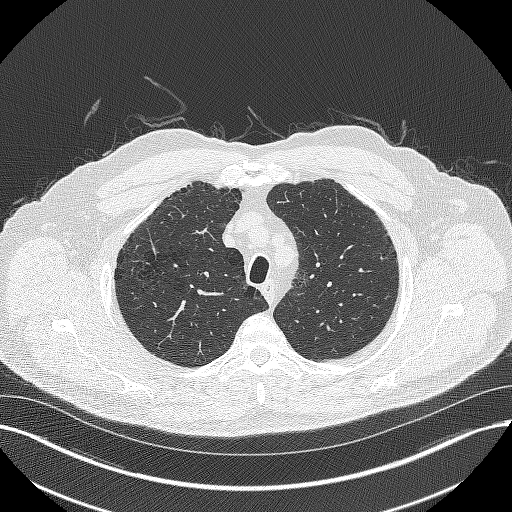
[im 310/359  lung]
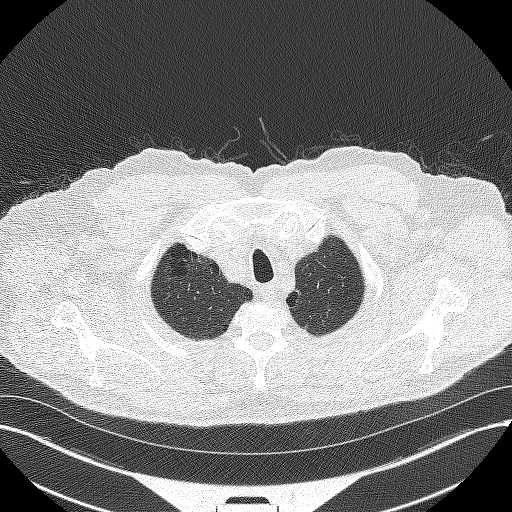
[im 342/359  lung]
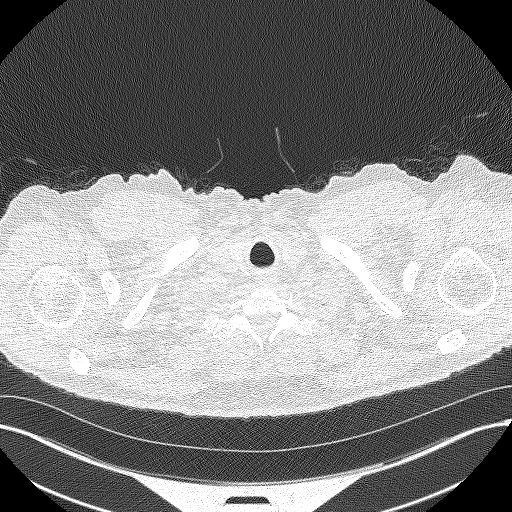

[15 of 40 positions shown; findings below may reference images not displayed]

FINDINGS: Cardiovascular: Atherosclerotic calcification of the aorta, aortic
valve and coronary arteries. Enlarged pulmonic trunk. Heart size
normal. No pericardial effusion.

Mediastinum/Nodes: No pathologically enlarged mediastinal or
axillary lymph nodes. Hilar regions are difficult to definitively
evaluate without IV contrast but appear grossly unremarkable.
Esophagus is grossly unremarkable.

Lungs/Pleura: Centrilobular and paraseptal emphysema. Scattered
pulmonary parenchymal scarring. Pulmonary nodules measure 4.8 mm or
less in size. No new or suspicious pulmonary nodules. No pleural
fluid. Airway is unremarkable.

Upper Abdomen: Visualized portions of the liver, gallbladder,
adrenal glands and right kidney are unremarkable. Tiny stones in the
left kidney. Visualized portions of the spleen, pancreas, stomach
and bowel are grossly unremarkable. No upper abdominal adenopathy.

Musculoskeletal: Degenerative changes in the spine. No worrisome
lytic or sclerotic lesions.
IMPRESSION: 1. Lung-RADS 2, benign appearance or behavior. Continue annual
screening with low-dose chest CT without contrast in 12 months.
2. Tiny left renal stones.
3. Aortic atherosclerosis ([id number].I). Coronary artery
calcification.
4. Enlarged pulmonic trunk, indicative of pulmonary arterial
hypertension.
5.  Emphysema ([id number]).

## 2023-10-31 ENCOUNTER — Other Ambulatory Visit: Payer: Self-pay | Admitting: Family Medicine

## 2023-10-31 DIAGNOSIS — I2510 Atherosclerotic heart disease of native coronary artery without angina pectoris: Secondary | ICD-10-CM

## 2023-10-31 DIAGNOSIS — I7143 Infrarenal abdominal aortic aneurysm, without rupture: Secondary | ICD-10-CM

## 2023-10-31 DIAGNOSIS — I10 Essential (primary) hypertension: Secondary | ICD-10-CM

## 2023-11-17 ENCOUNTER — Other Ambulatory Visit: Payer: Self-pay | Admitting: Family Medicine

## 2023-11-17 DIAGNOSIS — I2510 Atherosclerotic heart disease of native coronary artery without angina pectoris: Secondary | ICD-10-CM

## 2023-11-17 DIAGNOSIS — I700 Atherosclerosis of aorta: Secondary | ICD-10-CM

## 2023-11-20 ENCOUNTER — Ambulatory Visit: Payer: Medicare PPO | Admitting: Physician Assistant

## 2023-11-20 ENCOUNTER — Ambulatory Visit (HOSPITAL_COMMUNITY)
Admission: RE | Admit: 2023-11-20 | Discharge: 2023-11-20 | Disposition: A | Discharge status: Home / Self Care | Payer: Medicare PPO | Source: Ambulatory Visit | Attending: Vascular Surgery | Admitting: Vascular Surgery

## 2023-11-20 VITALS — BP 146/69 | HR 65 | Temp 98.4°F | Resp 20 | Ht 69.0 in | Wt 219.5 lb

## 2023-11-20 DIAGNOSIS — I7143 Infrarenal abdominal aortic aneurysm, without rupture: Secondary | ICD-10-CM

## 2023-11-20 NOTE — Progress Notes (Addendum)
 Office Note     CC:  follow up Requesting Provider:  Thedora Garnette HERO, MD  HPI: Joe Greene is a 73 y.o. (1951/09/01) male who presents with his wife for surveillance follow up of AAA. Based on CT and duplex evaluation his aneurysm has been 4.7 cm. He is without any abdominal pain or back pain. He says he does have known DDD and so occasionally in certain positions he will get back discomfort but this resolves. He does not have any pain in his legs on ambulation or rest. No tissue loss. He does have some restless legs. He also reports cramping in his abdominal muscles rather frequently that has been going on for many years but no pain. He is medically managed on Aspirin  and Statin. He takes Olmesartan -Amlodipine -HCTZ combination now for HTN. He is a former smoker.   Past Medical History:  Diagnosis Date   AAA (abdominal aortic aneurysm) (HCC)    Cataract    forming    Guillain Barr syndrome [facility])     Past Surgical History:  Procedure Laterality Date   FOOT SURGERY Right    HERNIA REPAIR Right    IR FLUORO GUIDE CV LINE RIGHT  10/25/2022   IR US  GUIDE VASC ACCESS RIGHT  10/25/2022   KNEE ARTHROSCOPY Left    WISDOM TOOTH EXTRACTION      Social History   Socioeconomic History   Marital status: Married    Spouse name: Bowe Sidor   Number of children: 2   Years of education: Not on file   Highest education level: Doctorate  Occupational History   Occupation: Retired    Comment: Scientist, Research (medical)- [place]  Tobacco Use   Smoking status: Former    Current packs/day: 0.00    Average packs/day: 2.0 packs/day for 50.0 years (100.0 ttl pk-yrs)    Types: Cigarettes    Start date: 09/18/1972    Quit date: 09/18/2022    Years since quitting: 1.1   Smokeless tobacco: Never  Vaping Use   Vaping status: Never Used  Substance and Sexual Activity   Alcohol use: Yes    Alcohol/week: 3.0 standard drinks of alcohol    Types: 3 Standard drinks or equivalent per week    Comment: 2 or 3  drinks per week   Drug use: Never   Sexual activity: Yes  Other Topics Concern   Not on file  Social History Narrative   Not on file   Social Drivers of Health   Financial Resource Strain: Low Risk  (10/01/2023)   Overall Financial Resource Strain (CARDIA)    Difficulty of Paying Living Expenses: Not hard at all  Food Insecurity: No Food Insecurity (10/01/2023)   Hunger Vital Sign    Worried About Running Out of Food in the Last Year: Never true    Ran Out of Food in the Last Year: Never true  Transportation Needs: No Transportation Needs (10/01/2023)   PRAPARE - Administrator, Civil Service (Medical): No    Lack of Transportation (Non-Medical): No  Physical Activity: Insufficiently Active (10/01/2023)   Exercise Vital Sign    Days of Exercise per Week: 5 days    Minutes of Exercise per Session: 20 min  Stress: No Stress Concern Present (10/01/2023)   [employer] of Occupational Health - Occupational Stress Questionnaire    Feeling of Stress : Not at all  Social Connections: Unknown (10/01/2023)   Social Connection and Isolation Panel [NHANES]    Frequency of Communication  with Friends and Family: Once a week    Frequency of Social Gatherings with Friends and Family: Patient declined    Attends Religious Services: More than 4 times per year    Active Member of [employer] or Organizations: Yes    Attends Engineer, Structural: More than 4 times per year    Marital Status: Married  Catering Manager Violence: Not At Risk (11/18/2022)   Humiliation, Afraid, Rape, and Kick questionnaire    Fear of Current or Ex-Partner: No    Emotionally Abused: No    Physically Abused: No    Sexually Abused: No    Family History  Problem Relation Age of Onset   Stroke Mother 57   AAA (abdominal aortic aneurysm) Mother    Cancer Father        Prostate   Colon cancer Neg Hx    Colon polyps Neg Hx    Esophageal cancer Neg Hx    Rectal cancer Neg Hx    Stomach cancer  Neg Hx     Current Outpatient Medications  Medication Sig Dispense Refill   acetaminophen  (TYLENOL ) 325 MG tablet Take 1-2 tablets (325-650 mg total) by mouth every 4 (four) hours as needed for mild pain.     aspirin  EC 81 MG tablet Take 81 mg by mouth in the morning.     DULoxetine  (CYMBALTA ) 60 MG capsule Take 1 capsule (60 mg total) by mouth daily. 90 capsule 3   Fluocinonide  (LIDEX  EX) Apply 1 application  topically daily as needed (rash).     gabapentin  (NEURONTIN ) 300 MG capsule Take 3 capsules (900 mg total) by mouth 3 (three) times daily. (Patient taking differently: Take 900 mg by mouth 3 (three) times daily. 2 capsule 2 times daily) 270 capsule 11   ibuprofen (ADVIL) 200 MG tablet Take 400 mg by mouth daily as needed for headache or mild pain.     immune globulin ,gamma, IGG, (BAYGAM) injection Inject into the muscle.     Olmesartan -amLODIPine -HCTZ 20-5-12.5 MG TABS TAKE 1 TABLET BY MOUTH DAILY 30 tablet 3   Omega-3 Fatty Acids (FISH OIL PO) Take 1 capsule by mouth daily with lunch. When able to remember     rosuvastatin  (CRESTOR ) 5 MG tablet TAKE 1 TABLET(5 MG) BY MOUTH DAILY 90 tablet 1   sildenafil  (VIAGRA ) 100 MG tablet TAKE 1/2 TO 1 TABLET(50 TO 100 MG) BY MOUTH DAILY AS NEEDED FOR ERECTILE DYSFUNCTION 10 tablet 5   triamcinolone  cream (KENALOG ) 0.1 % Apply 1 Application topically 2 (two) times daily. 30 g 0   No current facility-administered medications for this visit.    No Known Allergies   REVIEW OF SYSTEMS:  [X]  denotes positive finding, [ ]  denotes negative finding Cardiac  Comments:  Chest pain or chest pressure:    Shortness of breath upon exertion:    Short of breath when lying flat:    Irregular heart rhythm:        Vascular    Pain in calf, thigh, or hip brought on by ambulation:    Pain in feet at night that wakes you up from your sleep:     Blood clot in your veins:    Leg swelling:         Pulmonary    Oxygen at home:    Productive cough:      Wheezing:         Neurologic    Sudden weakness in arms or legs:     Sudden numbness  in arms or legs:     Sudden onset of difficulty speaking or slurred speech:    Temporary loss of vision in one eye:     Problems with dizziness:         Gastrointestinal    Blood in stool:     Vomited blood:         Genitourinary    Burning when urinating:     Blood in urine:        Psychiatric    Major depression:         Hematologic    Bleeding problems:    Problems with blood clotting too easily:        Skin    Rashes or ulcers:        Constitutional    Fever or chills:      PHYSICAL EXAMINATION:  Vitals:   11/20/23 0833  BP: (!) 146/69  Pulse: 65  Resp: 20  Temp: 98.4 F (36.9 C)  TempSrc: Temporal  SpO2: 93%  Weight: 219 lb 8 oz (99.6 kg)  Height: 5' 9 (1.753 m)    General:  WDWN in NAD; vital signs documented above Gait: Normal HENT: WNL, normocephalic Pulmonary: normal non-labored breathing , without wheezing Cardiac: regular HR Abdomen: soft, NT, no masses. No palpable AAA Vascular Exam/Pulses: 2+ femoral, 2+ DP and PT pulses bilaterally, feet warm and well perfused Extremities: without ischemic changes, without Gangrene , without cellulitis; without open wounds;  Musculoskeletal: no muscle wasting or atrophy  Neurologic: A&O X 3 Psychiatric:  The pt has Normal affect.   Non-Invasive Vascular Imaging:      Abdominal Aorta Findings:  +-----------+-------+----------+----------+--------+--------+--------+  Location  AP (cm)Trans (cm)PSV (cm/s)WaveformThrombusComments  +-----------+-------+----------+----------+--------+--------+--------+  Proximal  2.41   2.44                                          +-----------+-------+----------+----------+--------+--------+--------+  Mid       4.93   4.84      93                                  +-----------+-------+----------+----------+--------+--------+--------+  Distal    2.62   2.73       87                                  +-----------+-------+----------+----------+--------+--------+--------+  RT CIA Prox1.4    1.4       101                                 +-----------+-------+----------+----------+--------+--------+--------+   Visualization of the Left CIA Proximal artery was limited.   Summary:  Abdominal Aorta: There is evidence of abnormal dilatation of the mid Abdominal aorta. The largest aortic diameter has increased compared to prior exam. Previous diameter measurement was 4.7 cm obtained on 05/08/2023.    ASSESSMENT/PLAN:: 73 y.o. male here for follow up for AAA. He is without any associated back or abdominal pain. Duplex today shows slight increase in size from 4.7 cm to maximum of 4.9 cm. Reviewed repair if AAA size > 5.5 cm, growth > 1 cm/yr, and symptomatic status. Patient and his wife understand should he have  any sheering back or abdominal pain he should seek immediate medical attention or call 911.  - He will continue Aspirin  and statin - discussed importance of good blood pressure control - he will follow up in 6 months with repeat AAA duplex   Teretha Damme, PA-C [facility] [phone]  Clinic MD:  Sheree

## 2023-11-25 DIAGNOSIS — G610 Guillain-Barre syndrome: Secondary | ICD-10-CM | POA: Diagnosis not present

## 2023-11-26 DIAGNOSIS — G610 Guillain-Barre syndrome: Secondary | ICD-10-CM | POA: Diagnosis not present

## 2023-12-03 ENCOUNTER — Other Ambulatory Visit: Payer: Self-pay

## 2023-12-03 DIAGNOSIS — I7143 Infrarenal abdominal aortic aneurysm, without rupture: Secondary | ICD-10-CM

## 2023-12-13 DIAGNOSIS — G6181 Chronic inflammatory demyelinating polyneuritis: Secondary | ICD-10-CM | POA: Diagnosis not present

## 2023-12-13 DIAGNOSIS — M25472 Effusion, left ankle: Secondary | ICD-10-CM | POA: Diagnosis not present

## 2023-12-16 DIAGNOSIS — G610 Guillain-Barre syndrome: Secondary | ICD-10-CM | POA: Diagnosis not present

## 2023-12-17 DIAGNOSIS — G610 Guillain-Barre syndrome: Secondary | ICD-10-CM | POA: Diagnosis not present

## 2023-12-31 DIAGNOSIS — L239 Allergic contact dermatitis, unspecified cause: Secondary | ICD-10-CM | POA: Diagnosis not present

## 2023-12-31 DIAGNOSIS — L2989 Other pruritus: Secondary | ICD-10-CM | POA: Diagnosis not present

## 2024-01-06 DIAGNOSIS — G610 Guillain-Barre syndrome: Secondary | ICD-10-CM | POA: Diagnosis not present

## 2024-01-07 DIAGNOSIS — G610 Guillain-Barre syndrome: Secondary | ICD-10-CM | POA: Diagnosis not present

## 2024-01-20 ENCOUNTER — Ambulatory Visit
Admission: RE | Admit: 2024-01-20 | Discharge: 2024-01-20 | Disposition: A | Discharge status: Home / Self Care | Payer: Medicare PPO | Source: Ambulatory Visit | Attending: Family Medicine | Admitting: Family Medicine

## 2024-01-20 DIAGNOSIS — F17210 Nicotine dependence, cigarettes, uncomplicated: Secondary | ICD-10-CM

## 2024-01-20 DIAGNOSIS — Z87891 Personal history of nicotine dependence: Secondary | ICD-10-CM

## 2024-01-20 DIAGNOSIS — Z122 Encounter for screening for malignant neoplasm of respiratory organs: Secondary | ICD-10-CM | POA: Diagnosis not present

## 2024-01-24 ENCOUNTER — Ambulatory Visit: Payer: Medicare PPO

## 2024-01-24 DIAGNOSIS — Z0000 Encounter for general adult medical examination without abnormal findings: Secondary | ICD-10-CM

## 2024-01-24 NOTE — Patient Instructions (Signed)
 Joe Greene , Thank you for taking time to come for your Medicare Wellness Visit. I appreciate your ongoing commitment to your health goals. Please review the following plan we discussed and let me know if I can assist you in the future.   Referrals/Orders/Follow-Ups/Clinician Recommendations: none  This is a list of the screening recommended for you and due dates:  Health Maintenance  Topic Date Due   Hepatitis C Screening  Never done   COVID-19 Vaccine (6 - 2024-25 season) 07/14/2023   Screening for Lung Cancer  01/18/2024   Flu Shot  08/12/2024*   Medicare Annual Wellness Visit  01/23/2025   DTaP/Tdap/Td vaccine (3 - Td or Tdap) 09/02/2030   Colon Cancer Screening  12/08/2030   Pneumonia Vaccine  Completed   Zoster (Shingles) Vaccine  Completed   HPV Vaccine  Aged Out  *Topic was postponed. The date shown is not the original due date.    Advanced directives: (In Chart) A copy of your advanced directives are scanned into your chart should your provider ever need it.  Next Medicare Annual Wellness Visit scheduled for next year: Yes  insert Preventive Care attachment Insert FALL PREVENTION attachment if needed

## 2024-01-24 NOTE — Progress Notes (Signed)
 Subjective:   Joe Greene is a 73 y.o. who presents for a Medicare Wellness preventive visit.  Visit Complete: Virtual I connected with  Joe Greene on 01/24/24 by a audio enabled telemedicine application and verified that I am speaking with the correct person using two identifiers.  Patient Location: Home  Provider Location: Office/Clinic  I discussed the limitations of evaluation and management by telemedicine. The patient expressed understanding and agreed to proceed.  Vital Signs: Because this visit was a virtual/telehealth visit, some criteria may be missing or patient reported. Any vitals not documented were not able to be obtained and vitals that have been documented are patient reported.  VideoDeclined- This patient declined Librarian, academic. Therefore the visit was completed with audio only.  Persons Participating in Visit: n/a  AWV Questionnaire: Yes: Patient Medicare AWV questionnaire was completed by the patient on 01/21/2024; I have confirmed that all information answered by patient is correct and no changes since this date.  Cardiac Risk Factors include: advanced age (>84men, >12 women);male gender     Objective:    Today's Vitals   There is no height or weight on file to calculate BMI.     01/24/2024    3:45 PM 12/17/2022    3:15 PM 12/03/2022   11:41 AM 11/16/2022    2:00 AM 11/15/2022    7:37 AM 11/14/2022   10:21 AM 11/02/2022   10:00 PM  Advanced Directives  Does Patient Have a Medical Advance Directive? Yes Yes Yes  Yes Yes Yes  Type of Estate agent of [place];Living will Healthcare Power of [place];Living will Living will Living will  Healthcare Power of [place];Living will Healthcare Power of [place];Living will  Does patient want to make changes to medical advance directive?    No - Patient declined   No - Patient declined  Copy of Healthcare Power of Attorney in Chart? Yes - validated most recent copy  scanned in chart (See row information) Yes - validated most recent copy scanned in chart (See row information) Yes - validated most recent copy scanned in chart (See row information)   Yes - validated most recent copy scanned in chart (See row information)     Current Medications (verified) Outpatient Encounter Medications as of 01/24/2024  Medication Sig   acetaminophen (TYLENOL) 325 MG tablet Take 1-2 tablets (325-650 mg total) by mouth every 4 (four) hours as needed for mild pain.   aspirin EC 81 MG tablet Take 81 mg by mouth in the morning.   DULoxetine (CYMBALTA) 60 MG capsule Take 1 capsule (60 mg total) by mouth daily.   Fluocinonide (LIDEX EX) Apply 1 application  topically daily as needed (rash).   gabapentin (NEURONTIN) 300 MG capsule Take 3 capsules (900 mg total) by mouth 3 (three) times daily. (Patient taking differently: Take 900 mg by mouth 3 (three) times daily. 2 capsule 2 times daily)   ibuprofen (ADVIL) 200 MG tablet Take 400 mg by mouth daily as needed for headache or mild pain.   immune globulin,gamma, IGG, (BAYGAM) injection Inject into the muscle.   Omega-3 Fatty Acids (FISH OIL PO) Take 1 capsule by mouth daily with lunch. When able to remember   rosuvastatin (CRESTOR) 5 MG tablet TAKE 1 TABLET(5 MG) BY MOUTH DAILY   sildenafil (VIAGRA) 100 MG tablet TAKE 1/2 TO 1 TABLET(50 TO 100 MG) BY MOUTH DAILY AS NEEDED FOR ERECTILE DYSFUNCTION   triamcinolone cream (KENALOG) 0.1 % Apply 1 Application topically 2 (two)  times daily.   Olmesartan-amLODIPine-HCTZ 20-5-12.5 MG TABS TAKE 1 TABLET BY MOUTH DAILY   No facility-administered encounter medications on file as of 01/24/2024.    Allergies (verified) Patient has no known allergies.   History: Past Medical History:  Diagnosis Date   AAA (abdominal aortic aneurysm) (HCC)    Cataract    forming    Guillain Barr syndrome (HCC)    Past Surgical History:  Procedure Laterality Date   FOOT SURGERY Right    HERNIA REPAIR  Right    IR FLUORO GUIDE CV LINE RIGHT  10/25/2022   IR US GUIDE VASC ACCESS RIGHT  10/25/2022   KNEE ARTHROSCOPY Left    WISDOM TOOTH EXTRACTION     Family History  Problem Relation Age of Onset   Stroke Mother 20   AAA (abdominal aortic aneurysm) Mother    Cancer Father        Prostate   Colon cancer Neg Hx    Colon polyps Neg Hx    Esophageal cancer Neg Hx    Rectal cancer Neg Hx    Stomach cancer Neg Hx    Social History   Socioeconomic History   Marital status: Married    Spouse name: Jami Ohlin   Number of children: 2   Years of education: Not on file   Highest education level: Doctorate  Occupational History   Occupation: Retired    Comment: Scientist, research (medical)-   Tobacco Use   Smoking status: Former    Current packs/day: 0.00    Average packs/day: 2.0 packs/day for 50.0 years (100.0 ttl pk-yrs)    Types: Cigarettes    Start date: 09/18/1972    Quit date: 09/18/2022    Years since quitting: 1.3   Smokeless tobacco: Never  Vaping Use   Vaping status: Never Used  Substance and Sexual Activity   Alcohol use: Yes    Alcohol/week: 3.0 standard drinks of alcohol    Types: 3 Standard drinks or equivalent per week    Comment: 2 or 3 drinks per week   Drug use: Never   Sexual activity: Yes  Other Topics Concern   Not on file  Social History Narrative   Not on file   Social Drivers of Health   Financial Resource Strain: Low Risk  (01/21/2024)   Overall Financial Resource Strain (CARDIA)    Difficulty of Paying Living Expenses: Not hard at all  Food Insecurity: No Food Insecurity (01/21/2024)   Hunger Vital Sign    Worried About Running Out of Food in the Last Year: Never true    Ran Out of Food in the Last Year: Never true  Transportation Needs: No Transportation Needs (01/21/2024)   PRAPARE - Administrator, Civil Service (Medical): No    Lack of Transportation (Non-Medical): No  Physical Activity: Sufficiently Active (01/21/2024)    Exercise Vital Sign    Days of Exercise per Week: 3 days    Minutes of Exercise per Session: 60 min  Stress: No Stress Concern Present (01/21/2024)   [employer] of Occupational Health - Occupational Stress Questionnaire    Feeling of Stress : Not at all  Social Connections: Moderately Integrated (01/21/2024)   Social Connection and Isolation Panel [NHANES]    Frequency of Communication with Friends and Family: Once a week    Frequency of Social Gatherings with Friends and Family: Once a week    Attends Religious Services: More than 4 times per year    Active  Member of Clubs or Organizations: Yes    Attends Engineer, structural: 1 to 4 times per year    Marital Status: Married    Tobacco Counseling Counseling given: Not Answered    Clinical Intake:  Pre-visit preparation completed: Yes  Pain : No/denies pain     Nutritional Risks: None Diabetes: No  How often do you need to have someone help you when you read instructions, pamphlets, or other written materials from your doctor or pharmacy?: 1 - Never  Interpreter Needed?: No  Information entered by :: NAllen LPN   Activities of Daily Living     01/21/2024    9:42 AM  In your present state of health, do you have any difficulty performing the following activities:  Hearing? 1  Comment does not need hearing aids  Vision? 0  Difficulty concentrating or making decisions? 0  Walking or climbing stairs? 0  Dressing or bathing? 0  Doing errands, shopping? 0  Preparing Food and eating ? N  Using the Toilet? N  In the past six months, have you accidently leaked urine? N  Do you have problems with loss of bowel control? N  Managing your Medications? N  Managing your Finances? N  Housekeeping or managing your Housekeeping? N    Patient Care Team: Loyola Mast, MD as PCP - General (Family Medicine) Levert Feinstein, MD as Consulting Physician (Neurology)  Indicate any recent Medical Services you may have  received from other than [facility] providers in the past year (date may be approximate).     Assessment:   This is a routine wellness examination for Joy.  Hearing/Vision screen Hearing Screening - Comments:: Does not have hearing aids Vision Screening - Comments:: [facility] [place]   Goals Addressed             This Visit's Progress    Patient Stated       01/24/2024, denies goals       Depression Screen     01/24/2024    3:46 PM 05/22/2023    3:13 PM 12/17/2022    3:16 PM 11/21/2022    1:46 PM 11/14/2021    9:10 AM 11/14/2021    9:06 AM 10/04/2021   11:29 AM  PHQ 2/9 Scores  PHQ - 2 Score 0 0 0 0 0 0 0  PHQ- 9 Score 0          Fall Risk     01/21/2024    9:42 AM 05/22/2023    3:13 PM 12/17/2022    3:16 PM 12/13/2022    9:15 AM 11/21/2022    1:46 PM  Fall Risk   Falls in the past year? 0 1 1 1  0  Number falls in past yr: 0 1 0 0 0  Injury with Fall? 0 0 0 0 0  Risk for fall due to : Medication side effect No Fall Risks Medication side effect  No Fall Risks  Follow up Falls prevention discussed;Falls evaluation completed Falls evaluation completed Education provided;Falls prevention discussed;Falls evaluation completed  Falls evaluation completed    MEDICARE RISK AT HOME:  Medicare Risk at Home Any stairs in or around the home?: (Patient-Rptd) No If so, are there any without handrails?: (Patient-Rptd) No Home free of loose throw rugs in walkways, pet beds, electrical cords, etc?: (Patient-Rptd) Yes Adequate lighting in your home to reduce risk of falls?: (Patient-Rptd) Yes Life alert?: (Patient-Rptd) No Use of a cane, walker or w/c?: (  Patient-Rptd) No Grab bars in the bathroom?: (Patient-Rptd) No Shower chair or bench in shower?: (Patient-Rptd) No Elevated toilet seat or a handicapped toilet?: (Patient-Rptd) No  TIMED UP AND GO:  Was the test performed?  No  Cognitive Function: 6CIT completed        01/24/2024    3:47 PM 12/17/2022     3:17 PM  6CIT Screen  What Year? 0 points 0 points  What month? 0 points 0 points  What time? 0 points 0 points  Count back from 20 0 points 0 points  Months in reverse 0 points 0 points  Repeat phrase 0 points 0 points  Total Score 0 points 0 points    Immunizations Immunization History  Administered Date(s) Administered   Hepatitis A 07/28/2010   Influenza Whole 08/09/2020   Influenza-Unspecified 08/14/2021, 08/07/2022   PFIZER(Purple Top)SARS-COV-2 Vaccination 12/17/2019, 01/07/2020, 08/14/2020   Pfizer Covid-19 Vaccine Bivalent Booster 16yrs & up 08/18/2021, 08/07/2022   Pneumococcal Conjugate-13 04/03/2022   Pneumococcal Polysaccharide-23 09/02/2020   Td 07/28/2010   Tdap 09/02/2020   Zoster Recombinant(Shingrix) 09/02/2015, 04/02/2022    Screening Tests Health Maintenance  Topic Date Due   Hepatitis C Screening  Never done   COVID-19 Vaccine (6 - 2024-25 season) 07/14/2023   Lung Cancer Screening  01/18/2024   INFLUENZA VACCINE  08/12/2024 (Originally 06/13/2023)   Medicare Annual Wellness (AWV)  01/23/2025   DTaP/Tdap/Td (3 - Td or Tdap) 09/02/2030   Colonoscopy  12/08/2030   Pneumonia Vaccine 27+ Years old  Completed   Zoster Vaccines- Shingrix  Completed   HPV VACCINES  Aged Out    Health Maintenance  Health Maintenance Due  Topic Date Due   Hepatitis C Screening  Never done   COVID-19 Vaccine (6 - 2024-25 season) 07/14/2023   Lung Cancer Screening  01/18/2024   Health Maintenance Items Addressed: Declines vaccines at this time. Ct scan completed 01/20/2024  Additional Screening:  Vision Screening: Recommended annual ophthalmology exams for early detection of glaucoma and other disorders of the eye.  Dental Screening: Recommended annual dental exams for proper oral hygiene  Community Resource Referral / Chronic Care Management: CRR required this visit?  No   CCM required this visit?  No     Plan:     I have personally reviewed and noted the  following in the patient's chart:   Medical and social history Use of alcohol, tobacco or illicit drugs  Current medications and supplements including opioid prescriptions. Patient is not currently taking opioid prescriptions. Functional ability and status Nutritional status Physical activity Advanced directives List of other physicians Hospitalizations, surgeries, and ER visits in previous 12 months Vitals Screenings to include cognitive, depression, and falls Referrals and appointments  In addition, I have reviewed and discussed with patient certain preventive protocols, quality metrics, and best practice recommendations. A written personalized care plan for preventive services as well as general preventive health recommendations were provided to patient.     Barb Merino, LPN   1/61/0960   After Visit Summary: (MyChart) Due to this being a telephonic visit, the after visit summary with patients personalized plan was offered to patient via MyChart   Notes: Nothing significant to report at this time.

## 2024-01-29 DIAGNOSIS — G610 Guillain-Barre syndrome: Secondary | ICD-10-CM | POA: Diagnosis not present

## 2024-01-30 DIAGNOSIS — G610 Guillain-Barre syndrome: Secondary | ICD-10-CM | POA: Diagnosis not present

## 2024-02-06 ENCOUNTER — Ambulatory Visit: Payer: Medicare PPO | Admitting: Adult Health

## 2024-02-06 ENCOUNTER — Encounter: Payer: Self-pay | Admitting: Adult Health

## 2024-02-06 VITALS — BP 112/63 | HR 78 | Ht 69.0 in | Wt 215.0 lb

## 2024-02-06 DIAGNOSIS — G6181 Chronic inflammatory demyelinating polyneuritis: Secondary | ICD-10-CM | POA: Diagnosis not present

## 2024-02-06 MED ORDER — DULOXETINE HCL 40 MG PO CPEP: 40.0000 mg | ORAL_CAPSULE | Freq: Every day | ORAL | 5 refills | Status: DC

## 2024-02-06 NOTE — Patient Instructions (Addendum)
 Your Plan:  Continue gabapentin 600mg  twice daily   Decrease duloxetine to 40mg  daily, please let me know after 1 month if you are doing well and would like to further reduce dosage  Continue IVIG every 3 weeks     Follow up in 6 months with Dr. Terrace Arabia or call earlier if needed      Thank you for coming to see [place] at [facility]. I hope we have been able to provide you high quality care today.  You may receive a patient satisfaction survey over the next few weeks. We would appreciate your feedback and comments so that we may continue to improve ourselves and the health of our patients.

## 2024-02-06 NOTE — Progress Notes (Signed)
 Chief Complaint  Patient presents with   Numbness    Rm 3 with spouse Pt is well, reports he was doing well on infusions but had a set back. His symptoms slightly improved. He notices more tingling in BLE when he is sitting still     ASSESSMENT AND PLAN  Joe Greene is a 73 y.o. male   Chronic inflammatory demyelinating polyradiculoneuropathy  Started since early November 2023 following upper respiratory infection, loss of taste 2 weeks prior to the symptom onset  mild to moderate improvement following IVIG from November 14-18 2023, began to have worsening symptoms again since early December 2023, hospital admission again on Dec 14 for plasma Change, did help his symptoms, following that, at his best, he can ambulate without assistance, Hospital admission again on January 4th 2024, IVIG 2 g/kg completed on January 8, Outpatient patient IVIG, 2 g/kg loading dose on the week of December 10, 2022, then 1 g/kg every 3 weeks, his symptoms began to improve steadily and now stable.  Recommend continued IVIG 1 g/kg every 3 weeks as he continues to note benefit (did go 5 weeks in between infusions in December due to infusion scheduling with worsening of symptoms) Currently gabapentin 600mg  BID PRN for neuropathic pain (rx'd as 900mg  TID PRN), will reduce duloxetine from 60 mg to 40 mg per patient request. He was advised to continue this dosage for at least 1 month and then notify office if he wishes to further reduce dosage (he wishes to reduce pill burden)       Follow-up with Dr. Terrace Arabia in 6 months to alternate with MD or call earlier if needed       DIAGNOSTIC DATA (LABS, IMAGING, TESTING) - I reviewed patient records, labs, notes, testing and imaging myself where available.  EMG/NCV 10/24/2022 Conclusion: This is an abnormal study.  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  EMG/NCV  08/09/2023 Conclusion: This is an abnormal study.  There is continued evidence of patchy demyelinating polyradiculoneuropathy, as evident by left sural sensory spare, prolonged left median, ulnar F-wave latencies.  Compared to previous study in December 2023, there is significant improvement.   MEDICAL HISTORY:  Joe Greene, is a 73 year old male seen in request by his primary care physician Dr. 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 PMHX HTN Smoker, 2ppd, quit since Sep 23 2022.  Patient has been active all his life, began to noticed toes and fingertips numbness tingling on September 17, 2022, prior to that, he has suffered upper respiratory infection for 2 weeks, cough, loss of taste,  By November 10, he noticed progressive numbness upper and lower extremity, and also developed gait abnormality,  Symptoms continue to progressively getting worse, leading to emergency room presentation September 24, 2022, also had intractable headache,  He was admitted with diagnosis of probable Guillain-Barr, lumbar puncture September 25, 2022 showed wbc, 4, RBC 0,  TP 89, Glucose 80,   He had 5 days of IVIG, began to notice improvement shortly afterwards, continue to make progress, to the point he can ambulate without assistance at home for few days, but since early December, he noticed regression, increased numbness, gait abnormality,   Update October 24, 2022: Patient returns for electrodiagnostic study today, which confirmed acute demyelinating polyradiculoneuropathy, there was no evidence of significant axonal loss He  had symptom onset early November, received IVIG from November 14 for 5 consecutive days, he began to notice an after second and third dose, the benefit lasted about 2 weeks, at his best, he could take few steps at home without assistant,  Since  beginning of December 2023, 2 weeks after IVIG infusion, he began to notice worsening bilateral lower extremity ascending numbness, weakness, now could barely take a step, sitting in wheelchair most of the time, also frequent shortness of breath episode, sometimes waking up in the middle of the night  Also complains of difficulty emptying his bladder, urinary frequency, baseline elevated heart rate, consistent with autonomic involvement  We have attempted outpatient IVIG infusion, but due to insurance and preauthorization limitations, it needed few weeks to complete the process.  With his worsening symptoms, especially autonomic involvement, shortness of breath, continue progress of symptoms, per patient even worse than prior to treatment level in the middle of November, will refer him to hospital for either repeat IVIG or even consider plasma exchange  UPDATE Nov 21 2022: He was admitted to hospital following his electrodiagnostic study because of his complaints of worsening ascending paresthesia, gait abnormality  Central line placed on 12/14 he was started on plasma exchange, 5 rounds, every other day, with improvement in symptoms.  He completed  NIF-32 and vital capacity 2.7 L.  Then was discharged to inpatient rehabilitation on November 02, 2022, which continues improvement, at his best, he can ambulate without assistance  Shortly after he was discharged home on November 10, 2022, he noticed regression, hospital admission again on January 4th, IVIG 2 g/kg Completed on January 8th 2023, this round, he only noticed mild improvement  Discharge neurological examination showed normal upper extremity strength, mild distal upper extremity, hip flexion, distal leg weakness,  Patient complains of continued lower extremity paresthesia, weakness, gait abnormality, fingertips paresthesia, loss of dexterity of his fingers  He denies swallowing difficulty, breathing difficulty, no significant autonomic  symptoms  UPDATE Dec 11 2022 Dr. Terrace Arabia: Patient is accompanied by his wife at today's clinical visit, continue worsening lower extremity weakness, increased gait abnormality about 2 to 3 weeks following previous IVIG infusion, started outpatient IVIG loading 2 g/kg since December 10, 2021, tolerating infusion well  He complains of slow worsening low back pain, radiating pain to left hip, lateral thigh, paresthesia, increased after bearing weight, feels very discouraged that he has recurrent symptoms even with frequent IVIG treatment, also complains of intermittent upper extremity paresthesia, extending to above wrist level  UPDATE Feb 202 24 Dr. Terrace Arabia: He is accompanied his wife at today's visit, overall doing better, continue have low back pain radiating pain to left hip, intermittent bilateral lower extremity paresthesia, unsteady gait uses cane, but overall has much improved, IVIG every 3 weeks has been helpful, last infusion was on February 19 and 20  Personally reviewed MRI of lumbar December 29, 2022, multilevel degenerative changes, prominent disc and facet degenerative disease most prominent at L4-5, severe bilateral foraminal narrowing, L3-4 moderate bilateral foraminal narrowing  He denies bowel or bladder incontinence, intermittent bilateral hands paresthesia  Left hip x-ray showed no significant abnormality  He tolerated Cymbalta 30 mg,  Update 06/04/2023 JM:  Patient is accompanied by his wife.  He has been relatively stable since prior visit, denies any worsening of symptoms. He does still feel some weakness in legs, numbness/tingling in legs and left arm and at times uncomfortableness in legs while resting, will improve with movement or walking. He also mentions bilateral  foot pain at times, not at present in boots which have orthotics.  Up until 6 weeks ago, he would experience worsening of symptoms prior to IVIG infusion and then quite a bit of improvement after infusion but now feels  like he has "plateaued" where he no longer gets this fluctuation of symptoms.  He does continue to have occasional nerve pain typically only in the evening while resting, currently on duloxetine 60 mg nightly and gabapentin with recent dosage decreased to 600 mg BID.  He questions further reducing one of these medications, preferably duloxetine due to potential long-term side effects. 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 Discussed potentially spreading out his IVIG treatments, he would be interested in this if able more so because of the inconvenience with every 3-week infusion, he is otherwise tolerating infusions well. He also questions duration/ongoing need of IVIG infusions. Last infusion last week. Last scheduled infusion on 10/7 which will be his last cycle.   UPDATE Sept 27th 2024 Dr. Terrace Arabia: He is overall doing very well, gait back to his baseline, continued IVIG 1 g/kg body recent few weeks, he has intermittent worsening bilateral feet numbness tingling, especially after bearing weight, taking Cymbalta 60 mg every morning gabapentin 300 mg 3 to 4 tablets each day, complains of difficulty sleeping sometimes,   Update 02/06/2024 JM: Returns for follow-up visit. Continues with IVIG every 3 weeks. Had difficulty scheduling infusion on time in December and received infusion 2 weeks late with noticeable worsening of symptoms.  He has since been able to receive the IVIG on time every 3 weeks and symptoms gradually improving noting benefit with ongoing IVIG.  Feels his gait is stable, does not use AD, no recent falls.  He continues to have intermittent worsening of bilateral foot numbness especially with weightbearing.  Remains on duloxetine 60 mg daily and gabapentin but currently taking 600 mg twice daily.  He wishes to reduce medication dosage if able to help reduce pill burden.  EMG/NCV 07/2023 continued evidence of  patchy demyelinating polyradiculoneuropathy but overall significant improvement compared to prior study in 10/2022        PHYSICAL EXAM:   Vitals:   02/06/24 1331  BP: 112/63  Pulse: 78  Weight: 215 lb (97.5 kg)  Height: 5\' 9"  (1.753 m)   Body mass index is 31.75 kg/m.  PHYSICAL EXAMNIATION:  Gen: NAD, pleasant elderly Caucasian male, conversant, well nourised, well groomed                     Cardiovascular: Regular rate rhythm, no peripheral edema, warm, nontender. Eyes: Conjunctivae clear without exudates or hemorrhage Neck: Supple, no carotid bruits. Pulmonary: Clear to auscultation bilaterally   NEUROLOGICAL EXAM:  MENTAL STATUS: Speech/cognition: Awake, alert, oriented to history taking and casual conversation, normal breathing CRANIAL NERVES: CN II: Visual fields are full to confrontation. Pupils are round equal and briskly reactive to light. CN III, IV, VI: extraocular movement are normal. No ptosis. CN V: Facial sensation is intact to light touch CN VII: Face is symmetric with normal eye closure  CN VIII: Hearing is normal to causal conversation. CN IX, X: Phonation is normal. CN XI: Head turning and shoulder shrug are intact  MOTOR: He has no significant bilateral upper and lower extremity muscle weakness   REFLEXES: Areflexia  SENSORY:  Length-dependent decreased light touch, pinprick sensation to midfoot level; vibratory sensation to ankle level; mildly decreased R>L  toe proprioception Absent finger vibratory sensation, preserved finger proprioception, decreased pinprick to wrist level  COORDINATION: There is no trunk or limb dysmetria noted.  GAIT/STANCE: Pushes off chair from seated position, wide-based, stiffened legs bilaterally, able to tandem walk and heel toe without difficulty      REVIEW OF SYSTEMS:  Full 14 system review of systems performed and notable only for as above All other review of systems were negative.   ALLERGIES: No  Known Allergies  HOME MEDICATIONS: Current Outpatient Medications  Medication Sig Dispense Refill   acetaminophen (TYLENOL) 325 MG tablet Take 1-2 tablets (325-650 mg total) by mouth every 4 (four) hours as needed for mild pain.     aspirin EC 81 MG tablet Take 81 mg by mouth in the morning.     DULoxetine (CYMBALTA) 60 MG capsule Take 1 capsule (60 mg total) by mouth daily. 90 capsule 3   gabapentin (NEURONTIN) 300 MG capsule Take 3 capsules (900 mg total) by mouth 3 (three) times daily. (Patient taking differently: Take 900 mg by mouth in the morning, at noon, in the evening, and at bedtime. 2 capsule am and 2 campsule pm) 270 capsule 11   ibuprofen (ADVIL) 200 MG tablet Take 400 mg by mouth daily as needed for headache or mild pain.     immune globulin,gamma, IGG, (BAYGAM) injection Inject into the muscle.     Olmesartan-amLODIPine-HCTZ 20-5-12.5 MG TABS TAKE 1 TABLET BY MOUTH DAILY 30 tablet 3   Omega-3 Fatty Acids (FISH OIL PO) Take 1 capsule by mouth daily with lunch. When able to remember     rosuvastatin (CRESTOR) 5 MG tablet TAKE 1 TABLET(5 MG) BY MOUTH DAILY 90 tablet 1   sildenafil (VIAGRA) 100 MG tablet TAKE 1/2 TO 1 TABLET(50 TO 100 MG) BY MOUTH DAILY AS NEEDED FOR ERECTILE DYSFUNCTION 10 tablet 5   triamcinolone cream (KENALOG) 0.1 % Apply 1 Application topically 2 (two) times daily. 30 g 0   No current facility-administered medications for this visit.    PAST MEDICAL HISTORY: Past Medical History:  Diagnosis Date   AAA (abdominal aortic aneurysm) (HCC)    Cataract    forming    Guillain Barr syndrome (HCC)     PAST SURGICAL HISTORY: Past Surgical History:  Procedure Laterality Date   FOOT SURGERY Right    HERNIA REPAIR Right    IR FLUORO GUIDE CV LINE RIGHT  10/25/2022   IR US GUIDE VASC ACCESS RIGHT  10/25/2022   KNEE ARTHROSCOPY Left    WISDOM TOOTH EXTRACTION      FAMILY HISTORY: Family History  Problem Relation Age of Onset   Stroke Mother 4   AAA  (abdominal aortic aneurysm) Mother    Cancer Father        Prostate   Colon cancer Neg Hx    Colon polyps Neg Hx    Esophageal cancer Neg Hx    Rectal cancer Neg Hx    Stomach cancer Neg Hx     SOCIAL HISTORY: Social History   Socioeconomic History   Marital status: Married    Spouse name: Antonie Borjon   Number of children: 2   Years of education: Not on file   Highest education level: Doctorate  Occupational History   Occupation: Retired    Comment: Scientist, research (medical)- [employer]  Tobacco Use   Smoking status: Former    Current packs/day: 0.00    Average packs/day: 2.0 packs/day for 50.0 years (100.0 ttl pk-yrs)  Types: Cigarettes    Start date: 09/18/1972    Quit date: 09/18/2022    Years since quitting: 1.3   Smokeless tobacco: Never  Vaping Use   Vaping status: Never Used  Substance and Sexual Activity   Alcohol use: Yes    Alcohol/week: 3.0 standard drinks of alcohol    Types: 3 Standard drinks or equivalent per week    Comment: 2 or 3 drinks per week   Drug use: Never   Sexual activity: Yes  Other Topics Concern   Not on file  Social History Narrative   Not on file   Social Drivers of Health   Financial Resource Strain: Low Risk  (01/21/2024)   Overall Financial Resource Strain (CARDIA)    Difficulty of Paying Living Expenses: Not hard at all  Food Insecurity: No Food Insecurity (01/21/2024)   Hunger Vital Sign    Worried About Running Out of Food in the Last Year: Never true    Ran Out of Food in the Last Year: Never true  Transportation Needs: No Transportation Needs (01/21/2024)   PRAPARE - Administrator, Civil Service (Medical): No    Lack of Transportation (Non-Medical): No  Physical Activity: Sufficiently Active (01/21/2024)   Exercise Vital Sign    Days of Exercise per Week: 3 days    Minutes of Exercise per Session: 60 min  Stress: No Stress Concern Present (01/21/2024)   [employer] of Occupational Health - Occupational Stress  Questionnaire    Feeling of Stress : Not at all  Social Connections: Moderately Integrated (01/21/2024)   Social Connection and Isolation Panel [NHANES]    Frequency of Communication with Friends and Family: Once a week    Frequency of Social Gatherings with Friends and Family: Once a week    Attends Religious Services: More than 4 times per year    Active Member of [employer] or Organizations: Yes    Attends Banker Meetings: 1 to 4 times per year    Marital Status: Married  Catering manager Violence: Not At Risk (01/24/2024)   Humiliation, Afraid, Rape, and Kick questionnaire    Fear of Current or Ex-Partner: No    Emotionally Abused: No    Physically Abused: No    Sexually Abused: No     I spent 30 minutes of face-to-face and non-face-to-face time with patient and wife.  This included previsit chart review, lab review, study review, order entry, electronic health record documentation, patient education and discussion regarding above diagnoses and treatment plan and answered all other questions to patient and wife's satisfaction  Ihor Austin, AGNP-BC  [facility] [address] [place], [place] [address]  Phone [phone] Fax [phone] Note: This document was prepared with digital dictation and possible smart phrase technology. Any transcriptional errors that result from this process are unintentional.

## 2024-02-17 ENCOUNTER — Ambulatory Visit (INDEPENDENT_AMBULATORY_CARE_PROVIDER_SITE_OTHER): Payer: Medicare PPO | Admitting: Family Medicine

## 2024-02-17 ENCOUNTER — Telehealth: Payer: Self-pay

## 2024-02-17 ENCOUNTER — Encounter: Payer: Self-pay | Admitting: Family Medicine

## 2024-02-17 VITALS — BP 118/64 | HR 75 | Temp 98.0°F | Ht 69.0 in | Wt 218.4 lb

## 2024-02-17 DIAGNOSIS — I2510 Atherosclerotic heart disease of native coronary artery without angina pectoris: Secondary | ICD-10-CM | POA: Diagnosis not present

## 2024-02-17 DIAGNOSIS — I7143 Infrarenal abdominal aortic aneurysm, without rupture: Secondary | ICD-10-CM

## 2024-02-17 DIAGNOSIS — Z87891 Personal history of nicotine dependence: Secondary | ICD-10-CM

## 2024-02-17 DIAGNOSIS — G6181 Chronic inflammatory demyelinating polyneuritis: Secondary | ICD-10-CM | POA: Diagnosis not present

## 2024-02-17 DIAGNOSIS — I10 Essential (primary) hypertension: Secondary | ICD-10-CM

## 2024-02-17 DIAGNOSIS — E785 Hyperlipidemia, unspecified: Secondary | ICD-10-CM

## 2024-02-17 DIAGNOSIS — F17210 Nicotine dependence, cigarettes, uncomplicated: Secondary | ICD-10-CM

## 2024-02-17 DIAGNOSIS — F17200 Nicotine dependence, unspecified, uncomplicated: Secondary | ICD-10-CM

## 2024-02-17 DIAGNOSIS — Z122 Encounter for screening for malignant neoplasm of respiratory organs: Secondary | ICD-10-CM

## 2024-02-17 MED ORDER — GABAPENTIN 300 MG PO CAPS
600.0000 mg | ORAL_CAPSULE | Freq: Two times a day (BID) | ORAL | 3 refills | Status: DC
Start: 2024-02-17 — End: 2024-06-09

## 2024-02-17 MED ORDER — OLMESARTAN-AMLODIPINE-HCTZ 20-5-12.5 MG PO TABS
1.0000 | ORAL_TABLET | Freq: Every day | ORAL | 3 refills | Status: DC
Start: 2024-02-17 — End: 2024-05-04

## 2024-02-17 MED ORDER — ROSUVASTATIN CALCIUM 5 MG PO TABS
5.0000 mg | ORAL_TABLET | Freq: Every day | ORAL | 3 refills | Status: AC
Start: 2024-02-17 — End: ?

## 2024-02-17 NOTE — Assessment & Plan Note (Signed)
 Plan to repeat CT scan in mid-May. Joe Greene notes he thinks this is already scheduled. He should let me know if he needs a new refill.

## 2024-02-17 NOTE — Telephone Encounter (Signed)
 Called and spoke with patient. Reviewed results below. Patient states he saw his PCP this morning and was advised he had already reviewed the results. Pt confirms he is taking a stain.   IMPRESSION: 1. Lung-RADS 2, benign appearance or behavior. Continue annual screening with low-dose chest CT without contrast in 12 months. 2. Three-vessel coronary atherosclerosis. 3. Aortic Atherosclerosis (ICD10-I70.0) and Emphysema (ICD10-J43.9).

## 2024-02-17 NOTE — Assessment & Plan Note (Signed)
 Encouraged him to resume abstinence, esp. in light of arterial disease and hypertension.

## 2024-02-17 NOTE — Assessment & Plan Note (Signed)
Blood pressure is in good control. Continue olmesartan-amlodipine-HCTZ 20-5-12.5 mg daily.

## 2024-02-17 NOTE — Assessment & Plan Note (Signed)
 Has relapsed with smoking. I recommend he continue on a daily aspirin 81 mg daily and rosuvastatin 5 mg daily.

## 2024-02-17 NOTE — Assessment & Plan Note (Signed)
Stable.  Continue rosuvastatin 5 mg daily. 

## 2024-02-17 NOTE — Progress Notes (Signed)
 [facility] [address] [place] [address] Dept: [phone] Dept Fax: [phone]  Chronic Care Office Visit  Subjective:    Patient ID: Joe Greene, male    DOB: 05/03/1951, 73 y.o..   MRN: [id number]  Chief Complaint  Patient presents with   Follow-up    6 month f/u.  No concerns.   Fasting today.    History of Present Illness:  Patient is in today for reassessment of chronic medical issues.  Mr. Reinwald has a history of chronic inflammatory demyelinating polyneuropathy (Guillain-Barr syndrome). This condition started for him initially on Nov. 9, 2023, following a URI about 2 weeks earlier. He receives IVIG every 3 weeks.  He had a few week gap this winter in this therapy and feels this set him back some. he still notes weakness in his legs, esp. in the morning. He does remain active. Mr. Ganoe is also treated with gabapentin 600 mg BID and duloxetine 60 mg daily for neuropathic symptoms.    Mr. Ranieri has a history of hypertension. He admits that he still smokes occasionally, most recently cigars.Marland Kitchen He is managed on olmesartan-amlodipine-HCTZ 20-5-12.5 mg daily.   Mr. Staat has a history of an abdominal aortic aneurysm. He is having a CT scan every 6 months to monitor this. He was started on rosuvastatin 5 mg earlier this year.  Past Medical History: Patient Active Problem List   Diagnosis Date Noted   Dyslipidemia 10/05/2023   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HCC) 08/09/2023   Dermatitis 02/20/2023   Lumbar radiculopathy 01/01/2023   CIDP (chronic inflammatory demyelinating polyneuropathy) (HCC) 12/11/2022   Acute sensory neuropathy 10/25/2022   B12 deficiency 10/18/2022   Gait abnormality 10/18/2022   Pre-diabetes 09/25/2022   AAA (abdominal aortic aneurysm) (HCC) 09/24/2022   Essential hypertension 09/24/2022   Iron deficiency 04/04/2022   Hypertriglyceridemia 04/03/2022   Coronary artery calcification 01/18/2022    Candidal intertrigo    Emphysema of lung (HCC) 07/26/2021   Aortic atherosclerosis (HCC) 07/26/2021   Internal hemorrhoids 01/23/2021   Diverticula of colon 01/23/2021   Eczema 01/23/2021   Actinic keratoses 01/23/2021   Tobacco use disorder, mild, abuse 09/02/2020   Past Surgical History:  Procedure Laterality Date   FOOT SURGERY Right    HERNIA REPAIR Right    IR FLUORO GUIDE CV LINE RIGHT  10/25/2022   IR US GUIDE VASC ACCESS RIGHT  10/25/2022   KNEE ARTHROSCOPY Left    WISDOM TOOTH EXTRACTION     Family History  Problem Relation Age of Onset   Stroke Mother 4   AAA (abdominal aortic aneurysm) Mother    Cancer Father        Prostate   Colon cancer Neg Hx    Colon polyps Neg Hx    Esophageal cancer Neg Hx    Rectal cancer Neg Hx    Stomach cancer Neg Hx    Outpatient Medications Prior to Visit  Medication Sig Dispense Refill   acetaminophen (TYLENOL) 325 MG tablet Take 1-2 tablets (325-650 mg total) by mouth every 4 (four) hours as needed for mild pain.     aspirin EC 81 MG tablet Take 81 mg by mouth in the morning.     DULoxetine 40 MG CPEP Take 1 capsule (40 mg total) by mouth daily. 30 capsule 5   gabapentin (NEURONTIN) 300 MG capsule Take 3 capsules (900 mg total) by mouth 3 (three) times daily. (Patient taking differently: Take 900 mg by mouth in the  morning, at noon, in the evening, and at bedtime. 2 capsule am and 2 campsule pm) 270 capsule 11   ibuprofen (ADVIL) 200 MG tablet Take 400 mg by mouth daily as needed for headache or mild pain.     immune globulin,gamma, IGG, (BAYGAM) injection Inject into the muscle.     Omega-3 Fatty Acids (FISH OIL PO) Take 1 capsule by mouth daily with lunch. When able to remember     sildenafil (VIAGRA) 100 MG tablet TAKE 1/2 TO 1 TABLET(50 TO 100 MG) BY MOUTH DAILY AS NEEDED FOR ERECTILE DYSFUNCTION 10 tablet 5   Olmesartan-amLODIPine-HCTZ 20-5-12.5 MG TABS TAKE 1 TABLET BY MOUTH DAILY 30 tablet 3   rosuvastatin (CRESTOR) 5 MG  tablet TAKE 1 TABLET(5 MG) BY MOUTH DAILY 90 tablet 1   triamcinolone cream (KENALOG) 0.1 % Apply 1 Application topically 2 (two) times daily. 30 g 0   No facility-administered medications prior to visit.   No Known Allergies Objective:   Today's Vitals   02/17/24 0859  BP: 118/64  Pulse: 75  Temp: 98 F (36.7 C)  TempSrc: Temporal  SpO2: 94%  Weight: 218 lb 6.4 oz (99.1 kg)  Height: 5\' 9"  (1.753 m)   Body mass index is 32.25 kg/m.   General: Well developed, well nourished. No acute distress. Psych: Alert and oriented. Normal mood and affect.  Health Maintenance Due  Topic Date Due   Hepatitis C Screening  Never done     Assessment & Plan:   Problem List Items Addressed This Visit       Cardiovascular and Mediastinum   AAA (abdominal aortic aneurysm) (HCC) (Chronic)   Plan to repeat CT scan in mid-May. Mr. Fosdick notes he thinks this is already scheduled. He should let me know if he needs a new refill.      Relevant Medications   rosuvastatin (CRESTOR) 5 MG tablet   Olmesartan-amLODIPine-HCTZ 20-5-12.5 MG TABS   Coronary artery calcification   Has relapsed with smoking. I recommend he continue on a daily aspirin 81 mg daily and rosuvastatin 5 mg daily.      Relevant Medications   rosuvastatin (CRESTOR) 5 MG tablet   Olmesartan-amLODIPine-HCTZ 20-5-12.5 MG TABS   Essential hypertension   Blood pressure is in good control. Continue olmesartan-amlodipine-HCTZ 20-5-12.5 mg daily      Relevant Medications   rosuvastatin (CRESTOR) 5 MG tablet   Olmesartan-amLODIPine-HCTZ 20-5-12.5 MG TABS     Nervous and Auditory   CIDP (chronic inflammatory demyelinating polyneuropathy) (HCC) - Primary   Continue IVIG infusions and follow with neurology.      Relevant Medications   gabapentin (NEURONTIN) 300 MG capsule     Other   Dyslipidemia   Stable. Continue rosuvastatin 5 mg daily.      Relevant Medications   rosuvastatin (CRESTOR) 5 MG tablet   Tobacco use  disorder, mild, abuse   Encouraged him to resume abstinence, esp. in light of arterial disease and hypertension.       Return in about 3 months (around 05/18/2024) for Reassessment.   Loyola Mast, MD

## 2024-02-17 NOTE — Assessment & Plan Note (Signed)
 Continue IVIG infusions and follow with neurology.

## 2024-02-19 DIAGNOSIS — G610 Guillain-Barre syndrome: Secondary | ICD-10-CM | POA: Diagnosis not present

## 2024-02-20 DIAGNOSIS — G610 Guillain-Barre syndrome: Secondary | ICD-10-CM | POA: Diagnosis not present

## 2024-02-29 ENCOUNTER — Other Ambulatory Visit: Payer: Self-pay | Admitting: Adult Health

## 2024-03-11 DIAGNOSIS — G610 Guillain-Barre syndrome: Secondary | ICD-10-CM | POA: Diagnosis not present

## 2024-03-12 DIAGNOSIS — G610 Guillain-Barre syndrome: Secondary | ICD-10-CM | POA: Diagnosis not present

## 2024-04-01 DIAGNOSIS — G610 Guillain-Barre syndrome: Secondary | ICD-10-CM | POA: Diagnosis not present

## 2024-04-02 DIAGNOSIS — G610 Guillain-Barre syndrome: Secondary | ICD-10-CM | POA: Diagnosis not present

## 2024-04-07 DIAGNOSIS — D492 Neoplasm of unspecified behavior of bone, soft tissue, and skin: Secondary | ICD-10-CM | POA: Diagnosis not present

## 2024-04-07 DIAGNOSIS — L568 Other specified acute skin changes due to ultraviolet radiation: Secondary | ICD-10-CM | POA: Diagnosis not present

## 2024-04-22 DIAGNOSIS — G610 Guillain-Barre syndrome: Secondary | ICD-10-CM | POA: Diagnosis not present

## 2024-04-23 DIAGNOSIS — G610 Guillain-Barre syndrome: Secondary | ICD-10-CM | POA: Diagnosis not present

## 2024-04-24 ENCOUNTER — Telehealth: Payer: Self-pay | Admitting: Family Medicine

## 2024-04-24 NOTE — Telephone Encounter (Signed)
 Copied from CRM [id number]. Topic: Referral - Request for Referral >> Apr 24, 2024 11:44 AM Dewanda Foots wrote: Did the patient discuss referral with their provider in the last year? No (If No - schedule appointment) (If Yes - send message)  Appointment offered? No  Type of order/referral and detailed reason for visit: Wants a second opinion on possible skin cancer, states he needs all the office notes and test results from 05/16/22-04/07/2024 would like this expiated please.   Preference of office, provider, location: Would like first available but if possible [facility] [address] [place] [address] Phone number is [phone]  If referral order, have you been seen by this specialty before? Yes (If Yes, this issue or another issue? When? Where?  [facility], [place] [address] [place] [address]  Can we respond through MyChart? No, please call [phone]

## 2024-04-24 NOTE — Telephone Encounter (Signed)
 Please review message regarding a referral? Thanks. Dm/cma

## 2024-04-27 NOTE — Telephone Encounter (Signed)
 Spoke to patient and scheduled an appointment for 05/04/24 @ 3 pm.  Dm/cma

## 2024-05-04 ENCOUNTER — Encounter: Payer: Self-pay | Admitting: Family Medicine

## 2024-05-04 ENCOUNTER — Ambulatory Visit: Admitting: Family Medicine

## 2024-05-04 VITALS — BP 124/66 | HR 85 | Temp 97.0°F | Ht 69.0 in | Wt 220.6 lb

## 2024-05-04 DIAGNOSIS — G6181 Chronic inflammatory demyelinating polyneuritis: Secondary | ICD-10-CM | POA: Diagnosis not present

## 2024-05-04 DIAGNOSIS — I10 Essential (primary) hypertension: Secondary | ICD-10-CM | POA: Diagnosis not present

## 2024-05-04 DIAGNOSIS — D099 Carcinoma in situ, unspecified: Secondary | ICD-10-CM | POA: Diagnosis not present

## 2024-05-04 DIAGNOSIS — L568 Other specified acute skin changes due to ultraviolet radiation: Secondary | ICD-10-CM | POA: Insufficient documentation

## 2024-05-04 DIAGNOSIS — Z86007 Personal history of in-situ neoplasm of skin: Secondary | ICD-10-CM | POA: Insufficient documentation

## 2024-05-04 MED ORDER — AMLODIPINE-OLMESARTAN 5-20 MG PO TABS
1.0000 | ORAL_TABLET | Freq: Every day | ORAL | 3 refills | Status: AC
Start: 2024-05-04 — End: ?

## 2024-05-04 NOTE — Assessment & Plan Note (Signed)
 Blood pressure is in good control. In light of the possible drug side effect, I will switch him to olmesartan -amlodipine  20-5 mg daily, as the HCTZ component may cause a photodermatitis. I will have him follow-up in 5 weeks to recheck his BP.

## 2024-05-04 NOTE — Assessment & Plan Note (Signed)
 Continue IVIG infusions and follow with neurology. I am supportive of him tapering and stopping his duloxetine .

## 2024-05-04 NOTE — Progress Notes (Signed)
 [facility] [address] [place] [address] Dept: [phone] Dept Fax: [phone]  Office Visit  Subjective:    Patient ID: Joe Greene, male    DOB: 1951/03/11, 73 y.o..   MRN: [id number]  Chief Complaint  Patient presents with   Follow-up    Wants referral for dermatology (second opinion).   History of Present Illness:  Patient is in today to discuss skin rash issues. Mr. Kaufhold has noted that he has multiple skin lesions come up in sun-exposed areas. This first started in Feb., but has worsened since May. The lesions are mainly on his neck, face, upper chest, and arms. He feels this may be related to his BP medication (olmesartan -amlodipine -HCTZ) which was started this winter. He had seen a dermatologist. A shave biopsy was performed for one lesion on the left forearm. 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 He was recommended to startt chemotherapy. Mr. Cavins feels unsure of this approach and would like a 2nd opinion.  Mr. 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 This condition started for him initially on Nov. 9, 2023, following a URI about 2 weeks earlier. He receives IVIG every 3 weeks.  He still notes weakness in his legs, esp. in the morning. He does remain active. Mr. Odriscoll is also treated with gabapentin  600 mg BID and duloxetine  60 mg daily for neuropathic symptoms. He does not feel that this is really an issue for him. He has started tapering his duloxetine  and plans to try and stop this.   Mr. Saefong has a history of an abdominal aortic aneurysm. He is having a CT scan every 6 months to monitor this. He was started on rosuvastatin  5 mg earlier this year.  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/04/2024   Dyslipidemia 10/05/2023   GBS  (Guillain Barre syndrome) (HCC) 08/09/2023   Dermatitis 02/20/2023   Lumbar radiculopathy 01/01/2023   CIDP (chronic inflammatory demyelinating polyneuropathy) (HCC) 12/11/2022   Acute sensory neuropathy 10/25/2022   B12 deficiency 10/18/2022   Gait abnormality 10/18/2022   Pre-diabetes 09/25/2022   AAA (abdominal aortic aneurysm) (HCC) 09/24/2022   Essential hypertension 09/24/2022   Iron deficiency 04/04/2022   Hypertriglyceridemia 04/03/2022   Coronary artery calcification 01/18/2022   Candidal intertrigo    Emphysema of lung (HCC) 07/26/2021   Aortic atherosclerosis (HCC) 07/26/2021   Internal hemorrhoids 01/23/2021   Diverticula of colon 01/23/2021   Eczema 01/23/2021   Actinic keratoses 01/23/2021   Tobacco use disorder, mild, abuse 09/02/2020   Past Surgical History:  Procedure Laterality Date   FOOT SURGERY Right    HERNIA REPAIR Right    IR FLUORO GUIDE CV LINE RIGHT  10/25/2022   IR US  GUIDE VASC ACCESS RIGHT  10/25/2022   KNEE ARTHROSCOPY Left    WISDOM TOOTH EXTRACTION     Family History  Problem Relation Age of Onset   Stroke Mother 73   AAA (abdominal aortic aneurysm) Mother    Cancer Father        Prostate   Colon cancer Neg Hx    Colon polyps Neg Hx    Esophageal cancer Neg Hx    Rectal cancer Neg Hx    Stomach cancer Neg Hx    Outpatient Medications Prior to Visit  Medication Sig Dispense Refill   acetaminophen  (TYLENOL ) 325 MG tablet Take  1-2 tablets (325-650 mg total) by mouth every 4 (four) hours as needed for mild pain.     aspirin  EC 81 MG tablet Take 81 mg by mouth in the morning.     DULoxetine  HCl 40 MG CPEP TAKE 1 CAPSULE (40 MG TOTAL) BY MOUTH DAILY. 90 capsule 2   gabapentin  (NEURONTIN ) 300 MG capsule Take 2 capsules (600 mg total) by mouth 2 (two) times daily. (Patient taking differently: Take 300 mg by mouth 2 (two) times daily.) 360 capsule 3   ibuprofen (ADVIL) 200 MG tablet Take 400 mg by mouth daily as needed for headache or mild  pain.     immune globulin ,gamma, IGG, (BAYGAM) injection Inject into the muscle.     Omega-3 Fatty Acids (FISH OIL PO) Take 1 capsule by mouth daily with lunch. When able to remember     rosuvastatin  (CRESTOR ) 5 MG tablet Take 1 tablet (5 mg total) by mouth daily. 90 tablet 3   sildenafil  (VIAGRA ) 100 MG tablet TAKE 1/2 TO 1 TABLET(50 TO 100 MG) BY MOUTH DAILY AS NEEDED FOR ERECTILE DYSFUNCTION 10 tablet 5   Turmeric (QC TUMERIC COMPLEX) 500 MG CAPS Take by mouth.     Olmesartan -amLODIPine -HCTZ 20-5-12.5 MG TABS Take 1 tablet by mouth daily. 90 tablet 3   No facility-administered medications prior to visit.   No Known Allergies   Objective:   Today's Vitals   05/04/24 1443  BP: 124/66  Pulse: 85  Temp: (!) 97 F (36.1 C)  TempSrc: Temporal  SpO2: 95%  Weight: 220 lb 9.6 oz (100.1 kg)  Height: 5' 9 (1.753 m)   Body mass index is 32.58 kg/m.   General: Well developed, well nourished. No acute distress. Skin: Warm and dry. There are multiple maculopapular lesions that tend towards redness on the face, neck   upper chest and arms/hands.  Psych: Alert and oriented. Normal mood and affect.  Health Maintenance Due  Topic Date Due   Hepatitis C Screening  Never done     Assessment & Plan:   Problem List Items Addressed This Visit       Cardiovascular and Mediastinum   Essential hypertension   Blood pressure is in good control. In light of the possible drug side effect, I will switch him to olmesartan -amlodipine  20-5 mg daily, as the HCTZ component may cause a photodermatitis. I will have him follow-up in 5 weeks to recheck his BP.      Relevant Medications   amLODipine -olmesartan  (AZOR) 5-20 MG tablet     Nervous and Auditory   CIDP (chronic inflammatory demyelinating polyneuropathy) (HCC)   Continue IVIG infusions and follow with neurology. I am supportive of him tapering and stopping his duloxetine .        Musculoskeletal and Integument   Photodermatitis - Primary    HCTZ can cause a photodermatitis. I am supportive of stopping this medication and seeing how he does.        Other   Squamous cell carcinoma in situ (SCCIS)   Discussed possible implications. Mr. Fiola would like to have a 2nd opinion. He would refer to see Dr. Elverna in [place], [place]. 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      Relevant Orders   Ambulatory referral to Dermatology    Return in about 5 weeks (around 06/08/2024) for Reassessment.   Garnette CHRISTELLA Simpler, MD

## 2024-05-04 NOTE — Assessment & Plan Note (Signed)
 Discussed possible implications. Mr. Faison would like to have a 2nd opinion. He would refer to see Dr. Elverna in [place], [place]. 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

## 2024-05-04 NOTE — Assessment & Plan Note (Signed)
 HCTZ can cause a photodermatitis. I am supportive of stopping this medication and seeing how he does.

## 2024-05-13 DIAGNOSIS — G610 Guillain-Barre syndrome: Secondary | ICD-10-CM | POA: Diagnosis not present

## 2024-05-14 DIAGNOSIS — G610 Guillain-Barre syndrome: Secondary | ICD-10-CM | POA: Diagnosis not present

## 2024-05-18 ENCOUNTER — Ambulatory Visit: Admitting: Family Medicine

## 2024-05-20 ENCOUNTER — Ambulatory Visit: Payer: Medicare PPO | Attending: Vascular Surgery | Admitting: Physician Assistant

## 2024-05-20 ENCOUNTER — Ambulatory Visit (HOSPITAL_COMMUNITY)
Admission: RE | Admit: 2024-05-20 | Discharge: 2024-05-20 | Disposition: A | Discharge status: Home / Self Care | Payer: Medicare PPO | Source: Ambulatory Visit | Attending: Vascular Surgery | Admitting: Vascular Surgery

## 2024-05-20 VITALS — BP 133/82 | HR 66 | Temp 97.7°F | Ht 69.0 in | Wt 217.9 lb

## 2024-05-20 DIAGNOSIS — L821 Other seborrheic keratosis: Secondary | ICD-10-CM | POA: Diagnosis not present

## 2024-05-20 DIAGNOSIS — D225 Melanocytic nevi of trunk: Secondary | ICD-10-CM | POA: Diagnosis not present

## 2024-05-20 DIAGNOSIS — L308 Other specified dermatitis: Secondary | ICD-10-CM | POA: Diagnosis not present

## 2024-05-20 DIAGNOSIS — L439 Lichen planus, unspecified: Secondary | ICD-10-CM | POA: Diagnosis not present

## 2024-05-20 DIAGNOSIS — I7143 Infrarenal abdominal aortic aneurysm, without rupture: Secondary | ICD-10-CM | POA: Insufficient documentation

## 2024-05-20 DIAGNOSIS — L578 Other skin changes due to chronic exposure to nonionizing radiation: Secondary | ICD-10-CM | POA: Diagnosis not present

## 2024-05-20 DIAGNOSIS — L814 Other melanin hyperpigmentation: Secondary | ICD-10-CM | POA: Diagnosis not present

## 2024-05-20 DIAGNOSIS — D492 Neoplasm of unspecified behavior of bone, soft tissue, and skin: Secondary | ICD-10-CM | POA: Diagnosis not present

## 2024-05-20 DIAGNOSIS — D0462 Carcinoma in situ of skin of left upper limb, including shoulder: Secondary | ICD-10-CM | POA: Diagnosis not present

## 2024-05-20 DIAGNOSIS — L570 Actinic keratosis: Secondary | ICD-10-CM | POA: Diagnosis not present

## 2024-05-20 NOTE — Progress Notes (Signed)
 Office Note     CC:  follow up Requesting Provider:  Thedora Garnette HERO, MD  HPI: Joe Greene is a 73 y.o. ([birth year]) male who presents for surveillance follow up of AAA. Based on CT and duplex evaluation his aneurysm has been 4.7 cm. On last duplex it showed maximum diameter of 4.9 cm. He is without any abdominal pain or back pain. He continues to report cramping in his abdominal muscles, but this is unchanged for many years. He says he does have known DDD and so occasionally in certain positions he will get back discomfort but this resolves. He does have some restless legs. Also explains that he will get fatigue in his legs. He does not have any pain in his legs on ambulation or rest. No tissue loss.   He is medically managed on Aspirin  and Statin. He takes Olmesartan -Amlodipine -HCTZ combination now for HTN. He is a former smoker. Currently has returned to smoking cigars.  Past Medical History:  Diagnosis Date   AAA (abdominal aortic aneurysm) (HCC)    Cataract    forming    Guillain Barr syndrome [facility])     Past Surgical History:  Procedure Laterality Date   FOOT SURGERY Right    HERNIA REPAIR Right    IR FLUORO GUIDE CV LINE RIGHT  10/25/2022   IR US  GUIDE VASC ACCESS RIGHT  10/25/2022   KNEE ARTHROSCOPY Left    WISDOM TOOTH EXTRACTION      Social History   Socioeconomic History   Marital status: Married    Spouse name: Ziyad Dyar   Number of children: 2   Years of education: Not on file   Highest education level: Doctorate  Occupational History   Occupation: Retired    Comment: Scientist, research (medical)- [employer]  Tobacco Use   Smoking status: Every Day    Types: Cigars    Start date: 06/21/2023   Smokeless tobacco: Never  Vaping Use   Vaping status: Never Used  Substance and Sexual Activity   Alcohol use: Yes    Alcohol/week: 3.0 standard drinks of alcohol    Types: 3 Standard drinks or equivalent per week    Comment: 2 or 3 drinks per week   Drug use: Never    Sexual activity: Yes  Other Topics Concern   Not on file  Social History Narrative   Not on file   Social Drivers of Health   Financial Resource Strain: Low Risk  (05/01/2024)   Overall Financial Resource Strain (CARDIA)    Difficulty of Paying Living Expenses: Not hard at all  Food Insecurity: No Food Insecurity (05/01/2024)   Hunger Vital Sign    Worried About Running Out of Food in the Last Year: Never true    Ran Out of Food in the Last Year: Never true  Transportation Needs: No Transportation Needs (05/01/2024)   PRAPARE - Administrator, Civil Service (Medical): No    Lack of Transportation (Non-Medical): No  Physical Activity: Sufficiently Active (05/01/2024)   Exercise Vital Sign    Days of Exercise per Week: 7 days    Minutes of Exercise per Session: 60 min  Stress: No Stress Concern Present (05/01/2024)   [employer] of Occupational Health - Occupational Stress Questionnaire    Feeling of Stress: Not at all  Social Connections: Socially Integrated (05/01/2024)   Social Connection and Isolation Panel    Frequency of Communication with Friends and Family: Twice a week    Frequency of Social  Gatherings with Friends and Family: Once a week    Attends Religious Services: More than 4 times per year    Active Member of [employer] or Organizations: Yes    Attends Engineer, structural: More than 4 times per year    Marital Status: Married  Catering manager Violence: Not At Risk (01/24/2024)   Humiliation, Afraid, Rape, and Kick questionnaire    Fear of Current or Ex-Partner: No    Emotionally Abused: No    Physically Abused: No    Sexually Abused: No    Family History  Problem Relation Age of Onset   Stroke Mother 44   AAA (abdominal aortic aneurysm) Mother    Cancer Father        Prostate   Colon cancer Neg Hx    Colon polyps Neg Hx    Esophageal cancer Neg Hx    Rectal cancer Neg Hx    Stomach cancer Neg Hx     Current Outpatient Medications   Medication Sig Dispense Refill   acetaminophen  (TYLENOL ) 325 MG tablet Take 1-2 tablets (325-650 mg total) by mouth every 4 (four) hours as needed for mild pain.     amLODipine -olmesartan  (AZOR ) 5-20 MG tablet Take 1 tablet by mouth daily. 90 tablet 3   aspirin  EC 81 MG tablet Take 81 mg by mouth in the morning.     gabapentin  (NEURONTIN ) 300 MG capsule Take 2 capsules (600 mg total) by mouth 2 (two) times daily. (Patient taking differently: Take 300 mg by mouth 2 (two) times daily.) 360 capsule 3   ibuprofen (ADVIL) 200 MG tablet Take 400 mg by mouth daily as needed for headache or mild pain.     immune globulin ,gamma, IGG, (BAYGAM) injection Inject into the muscle.     Omega-3 Fatty Acids (FISH OIL PO) Take 1 capsule by mouth daily with lunch. When able to remember     rosuvastatin  (CRESTOR ) 5 MG tablet Take 1 tablet (5 mg total) by mouth daily. 90 tablet 3   sildenafil  (VIAGRA ) 100 MG tablet TAKE 1/2 TO 1 TABLET(50 TO 100 MG) BY MOUTH DAILY AS NEEDED FOR ERECTILE DYSFUNCTION 10 tablet 5   Turmeric (QC TUMERIC COMPLEX) 500 MG CAPS Take by mouth.     No current facility-administered medications for this visit.    No Known Allergies   REVIEW OF SYSTEMS:  [X]  denotes positive finding, [ ]  denotes negative finding Cardiac  Comments:  Chest pain or chest pressure:    Shortness of breath upon exertion:    Short of breath when lying flat:    Irregular heart rhythm:        Vascular    Pain in calf, thigh, or hip brought on by ambulation:    Pain in feet at night that wakes you up from your sleep:     Blood clot in your veins:    Leg swelling:         Pulmonary    Oxygen at home:    Productive cough:     Wheezing:         Neurologic    Sudden weakness in arms or legs:     Sudden numbness in arms or legs:     Sudden onset of difficulty speaking or slurred speech:    Temporary loss of vision in one eye:     Problems with dizziness:         Gastrointestinal    Blood in stool:      Vomited  blood:         Genitourinary    Burning when urinating:     Blood in urine:        Psychiatric    Major depression:         Hematologic    Bleeding problems:    Problems with blood clotting too easily:        Skin    Rashes or ulcers:        Constitutional    Fever or chills:      PHYSICAL EXAMINATION:  Vitals:   05/20/24 0820  BP: 133/82  Pulse: 66  Temp: 97.7 F (36.5 C)  TempSrc: Temporal  SpO2: 92%  Weight: 217 lb 14.4 oz (98.8 kg)  Height: 5' 9 (1.753 m)    General:  WDWN in NAD; vital signs documented above Gait: Normal HENT: WNL, normocephalic Pulmonary: normal non-labored breathing Cardiac: regular HR Abdomen: soft, NT, no masses. No palpable Aortic pulse Vascular Exam/Pulses: 2+ femoral, 2+ DP pulses bilaterally Extremities: without ischemic changes, without Gangrene , without cellulitis; without open wounds;  Musculoskeletal: no muscle wasting or atrophy  Neurologic: A&O X 3 Psychiatric:  The pt has Normal affect.   Non-Invasive Vascular Imaging:   Abdominal Aorta Findings:  +-----------+-------+----------+----------+--------+--------+--------+  Location  AP (cm)Trans (cm)PSV (cm/s)WaveformThrombusComments  +-----------+-------+----------+----------+--------+--------+--------+  Proximal  3.09   3.00      81                                  +-----------+-------+----------+----------+--------+--------+--------+  Mid       4.93   4.84      82                                  +-----------+-------+----------+----------+--------+--------+--------+  Distal    2.83   3.01      81                                  +-----------+-------+----------+----------+--------+--------+--------+  RT CIA Prox1.3    1.4       141                                 +-----------+-------+----------+----------+--------+--------+--------+  LT CIA Prox1.2    1.4       120                                  +-----------+-------+----------+----------+--------+--------+--------+   Summary:  Abdominal Aorta: The largest aortic measurement is 4.9 cm. The largest aortic diameter remains essentially unchanged compared to prior exam. Previous diameter measurement was 4.9 cm obtained on 11/20/23.   ASSESSMENT/PLAN:: 73 y.o. male here for follow up for AAA. He is without any back or abdominal pain. Duplex today shows stable aneurysm size with maximum diameter of 4.9 cm. This is unchanged from 6 months ago. - Recommend he refrain from any heavy lifting or straining - He understands should he have any shearing back or stomach pain to seek immediate medical attention - reviewed with him consideration for repair of AAA would be made when the size is 5.5cm, growth > 1 cm/yr, and symptomatic status. - Repeat AAA duplex in 6 months  Teretha Damme, PA-C [facility] [phone]  Clinic MD:   Sheree

## 2024-05-21 ENCOUNTER — Other Ambulatory Visit: Payer: Self-pay | Admitting: *Deleted

## 2024-05-21 DIAGNOSIS — I7143 Infrarenal abdominal aortic aneurysm, without rupture: Secondary | ICD-10-CM

## 2024-06-03 DIAGNOSIS — G610 Guillain-Barre syndrome: Secondary | ICD-10-CM | POA: Diagnosis not present

## 2024-06-04 DIAGNOSIS — G610 Guillain-Barre syndrome: Secondary | ICD-10-CM | POA: Diagnosis not present

## 2024-06-09 ENCOUNTER — Ambulatory Visit: Admitting: Family Medicine

## 2024-06-09 ENCOUNTER — Encounter: Payer: Self-pay | Admitting: Family Medicine

## 2024-06-09 VITALS — BP 118/66 | HR 69 | Temp 97.5°F | Ht 69.0 in | Wt 219.2 lb

## 2024-06-09 DIAGNOSIS — G6181 Chronic inflammatory demyelinating polyneuritis: Secondary | ICD-10-CM

## 2024-06-09 DIAGNOSIS — D099 Carcinoma in situ, unspecified: Secondary | ICD-10-CM | POA: Diagnosis not present

## 2024-06-09 DIAGNOSIS — I10 Essential (primary) hypertension: Secondary | ICD-10-CM

## 2024-06-09 DIAGNOSIS — M62838 Other muscle spasm: Secondary | ICD-10-CM | POA: Insufficient documentation

## 2024-06-09 DIAGNOSIS — L568 Other specified acute skin changes due to ultraviolet radiation: Secondary | ICD-10-CM

## 2024-06-09 NOTE — Assessment & Plan Note (Signed)
 Dr. Elverna supports the use of 5-FU to address this skin cancer. I recommend he discuss with his dermatologist regarding medication precautions and side effects.

## 2024-06-09 NOTE — Progress Notes (Signed)
 [facility] [address] [place] [address] Dept: [phone] Dept Fax: [phone]  Chronic Care Office Visit  Subjective:    Patient ID: Joe Greene, male    DOB: 29-Jul-1951, 73 y.o..   MRN: [id number]  Chief Complaint  Patient presents with   Follow-up    5 week f/u.  No concerns.      History of Present Illness:  Patient is in today for reassessment of chronic medical issues.  Mr. Viernes presents for a reassessment of his blood pressure. I had seen him in June with an apparent photodermatitis. This winter, he had been placed on olmesartan -amlodipine -HCTZ. Since htne he had developed a rash in exposed areas of his skin. At the last visit, we stopped the HCTZ component. Mr. Kozak feels his BP is still doing well. His rash has resolved. I did have him seen by Dr. Elverna at [name], that had agreed with this approach. As well, Mr. 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 He has been prescribed 5-FU cream, but has not started to use this yet.  Mr. 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 This condition started for him initially on Nov. 9, 2023, following a URI about 2 weeks earlier. He receives IVIG every 3 weeks.  Mr. Millhouse had been treated with gabapentin  600 mg BID and duloxetine  60 mg daily for neuropathic symptoms. He has tapered off of these and stopped them. He notes he cannot determine a difference in how he is doing. He asks about whether he should have a influenza vaccination this year.  Mr. Rafter also notes he has episodes of cramping of his abdominal muscles. This typically can occur once a day.  The area of cramping is not always in the same location. He has not had associated nausea or changes in his bowel movements. This was occurring prior to his development of GBS.  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   Squamous cell carcinoma in situ (SCCIS) 05/04/2024   Dyslipidemia 10/05/2023   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HCC) 08/09/2023   Lumbar radiculopathy 01/01/2023   CIDP (chronic inflammatory demyelinating polyneuropathy) (HCC) 12/11/2022   Acute sensory neuropathy 10/25/2022   B12 deficiency 10/18/2022   Gait abnormality 10/18/2022   Pre-diabetes 09/25/2022   AAA (abdominal aortic aneurysm) (HCC) 09/24/2022   Essential hypertension 09/24/2022   Iron deficiency 04/04/2022   Hypertriglyceridemia 04/03/2022   Coronary artery calcification 01/18/2022   Candidal intertrigo    Emphysema of lung (HCC) 07/26/2021   Aortic atherosclerosis (HCC) 07/26/2021   Internal hemorrhoids 01/23/2021   Diverticula of colon 01/23/2021   Eczema 01/23/2021   Actinic keratoses 01/23/2021   Tobacco use disorder, mild, abuse 09/02/2020   Past Surgical History:  Procedure Laterality Date   FOOT SURGERY Right    HERNIA REPAIR Right    IR FLUORO GUIDE CV LINE RIGHT  10/25/2022   IR US  GUIDE VASC ACCESS RIGHT  10/25/2022   KNEE ARTHROSCOPY Left    WISDOM TOOTH EXTRACTION     Family History  Problem Relation Age of Onset   Stroke Mother 53   AAA (abdominal aortic aneurysm) Mother    Intellectual disability Mother    Cancer Father        Prostate   Hearing loss Father    Colon cancer Neg Hx  Colon polyps Neg Hx    Esophageal cancer Neg Hx    Rectal cancer Neg Hx    Stomach cancer Neg Hx    Outpatient Medications Prior to Visit  Medication Sig Dispense Refill   acetaminophen  (TYLENOL ) 325 MG tablet Take 1-2 tablets (325-650 mg total) by mouth every 4 (four) hours as needed for mild pain.     amLODipine -olmesartan  (AZOR ) 5-20 MG tablet Take 1 tablet by mouth daily. 90 tablet 3   aspirin  EC 81 MG tablet Take 81 mg by mouth in the morning.     ibuprofen (ADVIL) 200 MG tablet Take 400 mg by mouth daily as  needed for headache or mild pain.     immune globulin ,gamma, IGG, (BAYGAM) injection Inject into the muscle.     Omega-3 Fatty Acids (FISH OIL PO) Take 1 capsule by mouth daily with lunch. When able to remember     rosuvastatin  (CRESTOR ) 5 MG tablet Take 1 tablet (5 mg total) by mouth daily. 90 tablet 3   sildenafil  (VIAGRA ) 100 MG tablet TAKE 1/2 TO 1 TABLET(50 TO 100 MG) BY MOUTH DAILY AS NEEDED FOR ERECTILE DYSFUNCTION 10 tablet 5   TOLAK 4 % CREA Apply topically daily.     Turmeric (QC TUMERIC COMPLEX) 500 MG CAPS Take by mouth.     gabapentin  (NEURONTIN ) 300 MG capsule Take 2 capsules (600 mg total) by mouth 2 (two) times daily. (Patient taking differently: Take 300 mg by mouth 2 (two) times daily.) 360 capsule 3   No facility-administered medications prior to visit.   No Known Allergies Objective:   Today's Vitals   06/09/24 0904  BP: 118/66  Pulse: 69  Temp: (!) 97.5 F (36.4 C)  TempSrc: Temporal  SpO2: 96%  Weight: 219 lb 3.2 oz (99.4 kg)  Height: 5' 9 (1.753 m)   Body mass index is 32.37 kg/m.   General: Well developed, well nourished. No acute distress. Psych: Alert and oriented. Normal mood and affect.  Health Maintenance Due  Topic Date Due   Hepatitis C Screening  Never done     Assessment & Plan:   Problem List Items Addressed This Visit       Cardiovascular and Mediastinum   Essential hypertension - Primary   Blood pressure is in good control. Continue olmesartan -amlodipine  20-5 mg daily.        Nervous and Auditory   CIDP (chronic inflammatory demyelinating polyneuropathy) (HCC)   Continue IVIG infusions and follow with neurology. As his neuropathy symptoms are no different off of duloxetine  and gabapentin , he will continue off of these meds. We reviewed guidance regarding immunizations for patients with priro history of GBS. As his GBS appears to have been related to a viral infection, I support him resuming influenza vaccination this year.         Musculoskeletal and Integument   Photodermatitis   Resolved. Appears to have related to HCTZ use.        Other   Spasm of abdominal muscles   Recommend he maintain adequate hydration with electrolytes. Continue stretches with relaxation when this occurs.      Squamous cell carcinoma in situ (SCCIS)   Dr. Elverna supports the use of 5-FU to address this skin cancer. I recommend he discuss with his dermatologist regarding medication precautions and side effects.       Return in about 3 months (around 09/09/2024) for Reassessment.   Garnette CHRISTELLA Simpler, MD

## 2024-06-09 NOTE — Assessment & Plan Note (Signed)
 Resolved. Appears to have related to HCTZ use.

## 2024-06-09 NOTE — Assessment & Plan Note (Signed)
 Blood pressure is in good control. Continue olmesartan -amlodipine  20-5 mg daily.

## 2024-06-09 NOTE — Assessment & Plan Note (Signed)
 Recommend he maintain adequate hydration with electrolytes. Continue stretches with relaxation when this occurs.

## 2024-06-09 NOTE — Assessment & Plan Note (Signed)
 Continue IVIG infusions and follow with neurology. As his neuropathy symptoms are no different off of duloxetine  and gabapentin , he will continue off of these meds. We reviewed guidance regarding immunizations for patients with priro history of GBS. As his GBS appears to have been related to a viral infection, I support him resuming influenza vaccination this year.

## 2024-06-18 ENCOUNTER — Other Ambulatory Visit: Payer: Self-pay | Admitting: Family Medicine

## 2024-06-18 NOTE — Telephone Encounter (Signed)
 Refill request for  Sildenafil  100 mg LR 08/05/23, #10, 5 rf LOV  06/09/24 FOV  09/08/24   Please review and advise.  Thanks. Dm/cma

## 2024-06-24 DIAGNOSIS — G610 Guillain-Barre syndrome: Secondary | ICD-10-CM | POA: Diagnosis not present

## 2024-06-25 DIAGNOSIS — G610 Guillain-Barre syndrome: Secondary | ICD-10-CM | POA: Diagnosis not present

## 2024-07-15 DIAGNOSIS — G610 Guillain-Barre syndrome: Secondary | ICD-10-CM | POA: Diagnosis not present

## 2024-07-16 DIAGNOSIS — G610 Guillain-Barre syndrome: Secondary | ICD-10-CM | POA: Diagnosis not present

## 2024-08-04 ENCOUNTER — Ambulatory Visit: Admitting: Neurology

## 2024-08-04 ENCOUNTER — Encounter: Payer: Self-pay | Admitting: Neurology

## 2024-08-04 VITALS — BP 107/68 | HR 73 | Ht 70.0 in | Wt 223.0 lb

## 2024-08-04 DIAGNOSIS — M792 Neuralgia and neuritis, unspecified: Secondary | ICD-10-CM | POA: Insufficient documentation

## 2024-08-04 DIAGNOSIS — G6181 Chronic inflammatory demyelinating polyneuritis: Secondary | ICD-10-CM

## 2024-08-04 MED ORDER — GABAPENTIN 300 MG PO CAPS: 300.0000 mg | ORAL_CAPSULE | Freq: Three times a day (TID) | ORAL | 11 refills | Status: AC

## 2024-08-04 NOTE — Progress Notes (Signed)
 Chief Complaint  Patient presents with   RM15/CIPD    Pt is here with his Wife. Pt states that he has had some leg fatigue in both of his legs that started 2-3 weeks ago.      ASSESSMENT AND PLAN  Joe Greene is a 73 y.o. male   Chronic inflammatory demyelinating polyradiculoneuropathy  Started since early November 2023 following upper respiratory infection, loss of taste 2 weeks prior to the symptom onset  mild to moderate improvement following IVIG from November 14-18 2023, began to have worsening symptoms again since early December 2023, hospital admission again on Dec 14 for plasma Change, did help his symptoms, following that, at his best, he can ambulate without assistance, Hospital admission again on January 4th 2024, IVIG 2 g/kg completed on January 8, Outpatient patient IVIG, 2 g/kg loading dose on the week of December 10, 2022, then 1 g/kg every 3 weeks, his symptoms began to improve steadily and now stable.  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, only intermittent bilateral lower extremity feeling fatigued, but able to walk many miles a day, denies significant low back pain, After discussed with patient, we decided to stop IVIG infusion, he is off Cymbalta , only on gabapentin  300 mg 3 times a day as needed, refill his prescription, If he has worsening symptoms, may consider MRI of lumbar, brisk upper extremity and patellar reflex with possible Babinski signs, previous cervical x-ray showed multilevel degenerative changes, may also consider EMG nerve conduction study  DIAGNOSTIC DATA (LABS, IMAGING, TESTING) - I reviewed patient records, labs, notes, testing and imaging myself where available.  EMG/NCV 10/24/2022 Conclusion: This is an abnormal study.  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  EMG/NCV  08/09/2023 Conclusion: This is an abnormal study.  There is continued evidence of patchy demyelinating polyradiculoneuropathy, as evident by left sural sensory spare, prolonged left median, ulnar F-wave latencies.  Compared to previous study in December 2023, there is significant improvement.   MEDICAL HISTORY:  Joe Greene, is a 73 year old male seen in request by his primary care physician Dr. 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 PMHX HTN Smoker, 2ppd, quit since Sep 23 2022.  Patient has been active all his life, began to noticed toes and fingertips numbness tingling on September 17, 2022, prior to that, he has suffered upper respiratory infection for 2 weeks, cough, loss of taste,  By November 10, he noticed progressive numbness upper and lower extremity, and also developed gait abnormality,  Symptoms continue to progressively getting worse, leading to emergency room presentation September 24, 2022, also had intractable headache,  He was admitted with diagnosis of probable Guillain-Barr, lumbar puncture September 25, 2022 showed wbc, 4, RBC 0,  TP 89, Glucose 80,   He had 5 days of IVIG, began to notice improvement shortly afterwards, continue to make progress, to the point he can ambulate without assistance at home for few days, but since early December, he noticed regression, increased numbness, gait abnormality,   Update October 24, 2022: Patient returns for electrodiagnostic study today, which confirmed acute demyelinating polyradiculoneuropathy, there was no evidence of significant axonal loss He had symptom onset early November, received IVIG from November 14 for 5 consecutive days, he began  to notice an after second and third dose, the benefit lasted about 2 weeks, at his best, he could take few steps at home without assistant,  Since  beginning of December 2023, 2 weeks after IVIG infusion, he began to notice worsening bilateral lower extremity ascending numbness, weakness, now could barely take a step, sitting in wheelchair most of the time, also frequent shortness of breath episode, sometimes waking up in the middle of the night  Also complains of difficulty emptying his bladder, urinary frequency, baseline elevated heart rate, consistent with autonomic involvement  We have attempted outpatient IVIG infusion, but due to insurance and preauthorization limitations, it needed few weeks to complete the process.  With his worsening symptoms, especially autonomic involvement, shortness of breath, continue progress of symptoms, per patient even worse than prior to treatment level in the middle of November, will refer him to hospital for either repeat IVIG or even consider plasma exchange  UPDATE Nov 21 2022: He was admitted to hospital following his electrodiagnostic study because of his complaints of worsening ascending paresthesia, gait abnormality  Central line placed on 12/14 he was started on plasma exchange, 5 rounds, every other day, with improvement in symptoms.  He completed  NIF-32 and vital capacity 2.7 L.  Then was discharged to inpatient rehabilitation on November 02, 2022, which continues improvement, at his best, he can ambulate without assistance  Shortly after he was discharged home on November 10, 2022, he noticed regression, hospital admission again on January 4th, IVIG 2 g/kg Completed on January 8th 2023, this round, he only noticed mild improvement  Discharge neurological examination showed normal upper extremity strength, mild distal upper extremity, hip flexion, distal leg weakness,  Patient complains of continued lower extremity paresthesia, weakness, gait abnormality, fingertips paresthesia, loss of dexterity of his fingers  He denies swallowing difficulty, breathing difficulty, no significant autonomic  symptoms  UPDATE Dec 11 2022 Dr. Onita: Patient is accompanied by his wife at today's clinical visit, continue worsening lower extremity weakness, increased gait abnormality about 2 to 3 weeks following previous IVIG infusion, started outpatient IVIG loading 2 g/kg since December 10, 2021, tolerating infusion well  He complains of slow worsening low back pain, radiating pain to left hip, lateral thigh, paresthesia, increased after bearing weight, feels very discouraged that he has recurrent symptoms even with frequent IVIG treatment, also complains of intermittent upper extremity paresthesia, extending to above wrist level  UPDATE Feb 202 24 Dr. Onita: He is accompanied his wife at today's visit, overall doing better, continue have low back pain radiating pain to left hip, intermittent bilateral lower extremity paresthesia, unsteady gait uses cane, but overall has much improved, IVIG every 3 weeks has been helpful, last infusion was on February 19 and 20  Personally reviewed MRI of lumbar December 29, 2022, multilevel degenerative changes, prominent disc and facet degenerative disease most prominent at L4-5, severe bilateral foraminal narrowing, L3-4 moderate bilateral foraminal narrowing  He denies bowel or bladder incontinence, intermittent bilateral hands paresthesia  Left hip x-ray showed no significant abnormality  He tolerated Cymbalta  30 mg,   UPDATE Sept 27th 2024:  He is overall doing very well, gait back to his baseline, continued IVIG 1 g/kg body recent few weeks, he has intermittent worsening bilateral feet numbness tingling, especially after bearing weight, taking Cymbalta  60 mg every morning gabapentin  300 mg 3 to 4 tablets each day, complains of difficulty sleeping sometimes,  UPDATE Sept 23 2025: He is accompanied by his wife at today's clinical visit,  overall doing very well, has been receiving IVIG 1 g/kg every 3 weeks, off Cymbalta  now, taking gabapentin  300 mg 3 times as needed  for lower extremity achy pain, he is very active at farm, complains of lower extremity fatigue easily, denies significant low back pain, no bowel and bladder incontinence, intermittent hands paresthesia  EMG/NCV 07/2023 continued evidence of patchy demyelinating polyradiculoneuropathy but overall significant improvement compared to prior study in 10/2022, there was also evidence of left carpal tunnel syndrome.  PHYSICAL EXAM:   Vitals:   08/04/24 0948  BP: 107/68  Pulse: 73  SpO2: 98%  Weight: 223 lb (101.2 kg)  Height: 5' 10 (1.778 m)   Body mass index is 32 kg/m.  PHYSICAL EXAMNIATION:  Gen: NAD, pleasant elderly Caucasian male, conversant, well nourised, well groomed                     Cardiovascular: Regular rate rhythm, no peripheral edema, warm, nontender. Eyes: Conjunctivae clear without exudates or hemorrhage Neck: Supple, no carotid bruits. Pulmonary: Clear to auscultation bilaterally   NEUROLOGICAL EXAM:  MENTAL STATUS: Speech/cognition: Awake, alert, oriented to history taking and casual conversation, normal breathing CRANIAL NERVES: CN II: Visual fields are full to confrontation. Pupils are round equal and briskly reactive to light. CN III, IV, VI: extraocular movement are normal. No ptosis. CN V: Facial sensation is intact to light touch CN VII: Face is symmetric with normal eye closure  CN VIII: Hearing is normal to causal conversation. CN IX, X: Phonation is normal. CN XI: Head turning and shoulder shrug are intact  MOTOR: He has no significant bilateral upper and lower extremity muscle weakness   REFLEXES: Well-preserved deep tendon reflex of bilateral upper extremity, patella, absent at the ankle  SENSORY:  Length-dependent decreased light touch, pinprick sensation and vibratory sensation to ankle level  COORDINATION: There is no trunk or limb dysmetria noted.  GAIT/STANCE: Getting up from a chair arm crossed, steady,      REVIEW OF SYSTEMS:   Full 14 system review of systems performed and notable only for as above All other review of systems were negative.   ALLERGIES: Allergies  Allergen Reactions   Hydrochlorothiazide Rash    Photodermatitis    HOME MEDICATIONS: Current Outpatient Medications  Medication Sig Dispense Refill   acetaminophen  (TYLENOL ) 325 MG tablet Take 1-2 tablets (325-650 mg total) by mouth every 4 (four) hours as needed for mild pain.     amLODipine -olmesartan  (AZOR ) 5-20 MG tablet Take 1 tablet by mouth daily. 90 tablet 3   aspirin  EC 81 MG tablet Take 81 mg by mouth in the morning.     ibuprofen (ADVIL) 200 MG tablet Take 400 mg by mouth daily as needed for headache or mild pain.     immune globulin ,gamma, IGG, (BAYGAM) injection Inject into the muscle.     Omega-3 Fatty Acids (FISH OIL PO) Take 1 capsule by mouth daily with lunch. When able to remember     rosuvastatin  (CRESTOR ) 5 MG tablet Take 1 tablet (5 mg total) by mouth daily. 90 tablet 3   sildenafil  (VIAGRA ) 100 MG tablet TAKE 1/2 TO 1 TABLET BY MOUTH DAILY AS NEEDED FOR ED 30 tablet 11   TOLAK 4 % CREA Apply topically daily.     Turmeric (QC TUMERIC COMPLEX) 500 MG CAPS Take by mouth.     No current facility-administered medications for this visit.    PAST MEDICAL HISTORY: Past Medical History:  Diagnosis Date  AAA (abdominal aortic aneurysm)    Cataract    forming    Emphysema of lung (HCC)    Guillain Barr syndrome     PAST SURGICAL HISTORY: Past Surgical History:  Procedure Laterality Date   FOOT SURGERY Right    HERNIA REPAIR Right    IR FLUORO GUIDE CV LINE RIGHT  10/25/2022   IR US  GUIDE VASC ACCESS RIGHT  10/25/2022   KNEE ARTHROSCOPY Left    WISDOM TOOTH EXTRACTION      FAMILY HISTORY: Family History  Problem Relation Age of Onset   Stroke Mother 28   AAA (abdominal aortic aneurysm) Mother    Intellectual disability Mother    Cancer Father        Prostate   Hearing loss Father    Colon cancer Neg Hx     Colon polyps Neg Hx    Esophageal cancer Neg Hx    Rectal cancer Neg Hx    Stomach cancer Neg Hx     SOCIAL HISTORY: Social History   Socioeconomic History   Marital status: Married    Spouse name: Jakye Mullens   Number of children: 2   Years of education: Not on file   Highest education level: Doctorate  Occupational History   Occupation: Retired    Comment: Scientist, research (medical)- [employer]  Tobacco Use   Smoking status: Every Day    Types: Cigars    Start date: 06/21/2023   Smokeless tobacco: Never  Vaping Use   Vaping status: Never Used  Substance and Sexual Activity   Alcohol use: Yes    Alcohol/week: 3.0 standard drinks of alcohol    Types: 3 Standard drinks or equivalent per week    Comment: 2 or 3 drinks per week   Drug use: Never   Sexual activity: Yes  Other Topics Concern   Not on file  Social History Narrative   Not on file   Social Drivers of Health   Financial Resource Strain: Low Risk  (05/01/2024)   Overall Financial Resource Strain (CARDIA)    Difficulty of Paying Living Expenses: Not hard at all  Food Insecurity: No Food Insecurity (05/01/2024)   Hunger Vital Sign    Worried About Running Out of Food in the Last Year: Never true    Ran Out of Food in the Last Year: Never true  Transportation Needs: No Transportation Needs (05/01/2024)   PRAPARE - Administrator, Civil Service (Medical): No    Lack of Transportation (Non-Medical): No  Physical Activity: Sufficiently Active (05/01/2024)   Exercise Vital Sign    Days of Exercise per Week: 7 days    Minutes of Exercise per Session: 60 min  Stress: No Stress Concern Present (05/01/2024)   [employer] of Occupational Health - Occupational Stress Questionnaire    Feeling of Stress: Not at all  Social Connections: Socially Integrated (05/01/2024)   Social Connection and Isolation Panel    Frequency of Communication with Friends and Family: Twice a week    Frequency of Social Gatherings with  Friends and Family: Once a week    Attends Religious Services: More than 4 times per year    Active Member of [employer] or Organizations: Yes    Attends Banker Meetings: More than 4 times per year    Marital Status: Married  Catering manager Violence: Not At Risk (01/24/2024)   Humiliation, Afraid, Rape, and Kick questionnaire    Fear of Current or Ex-Partner: No  Emotionally Abused: No    Physically Abused: No    Sexually Abused: No   Modena Callander. M.D. Ph.D.

## 2024-08-06 ENCOUNTER — Encounter: Payer: Self-pay | Admitting: Family Medicine

## 2024-09-09 ENCOUNTER — Ambulatory Visit: Admitting: Family Medicine

## 2024-09-09 ENCOUNTER — Encounter: Payer: Self-pay | Admitting: Family Medicine

## 2024-09-09 VITALS — BP 122/64 | HR 66 | Temp 97.7°F | Ht 70.0 in | Wt 219.8 lb

## 2024-09-09 DIAGNOSIS — I10 Essential (primary) hypertension: Secondary | ICD-10-CM | POA: Diagnosis not present

## 2024-09-09 DIAGNOSIS — J439 Emphysema, unspecified: Secondary | ICD-10-CM | POA: Diagnosis not present

## 2024-09-09 DIAGNOSIS — F17200 Nicotine dependence, unspecified, uncomplicated: Secondary | ICD-10-CM

## 2024-09-09 DIAGNOSIS — E785 Hyperlipidemia, unspecified: Secondary | ICD-10-CM | POA: Diagnosis not present

## 2024-09-09 DIAGNOSIS — G6181 Chronic inflammatory demyelinating polyneuritis: Secondary | ICD-10-CM | POA: Diagnosis not present

## 2024-09-09 NOTE — Assessment & Plan Note (Signed)
 I recommend he stop use of tobacco. As noted above, he is not ready for this step.

## 2024-09-09 NOTE — Assessment & Plan Note (Signed)
Stable.  Continue rosuvastatin 5 mg daily. 

## 2024-09-09 NOTE — Assessment & Plan Note (Signed)
 Blood pressure is in good control. Continue olmesartan -amlodipine  20-5 mg daily.

## 2024-09-09 NOTE — Progress Notes (Signed)
 [facility] [address] [place] [address] Dept: [phone] Dept Fax: [phone]  Chronic Care Office Visit  Subjective:    Patient ID: Joe Greene, male    DOB: [birth year], 73 y.o..   MRN: [id number]  Chief Complaint  Patient presents with   Hypertension    3 month f/u HTN.  Not fasting.  No concerns.    History of Present Illness:  Patient is in today for reassessment of chronic medical issues.  Joe Greene has a history of hypertension. He is managed on amlodipine -olmesartan  (Azor ) 5-20 mg daily.   Joe Greene has a history of an abdominal aortic aneurysm. He is having a CT scan every 6 months to monitor this. He is managed on rosuvastatin  5 mg daily. He admits that he is smoking several cigars daily. He is not interested in medication to help him quit at this point.   Joe Greene has a history of chronic inflammatory demyelinating polyneuropathy (Guillain-Barr syndrome). This condition started for him initially on Nov. 9, 2023, following a URI about 2 weeks earlier. He had been treated with IVIG. He and his neurologist agreed to stop these treatments in early Sept. He feels his legs fatigue too easily, but does not feel this has been worse since stopping the IG. He did start doing some walkign on a treadmill recently. Joe Greene restarted gabapentin  600 mg BID for neuropathic symptoms.    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   Dyslipidemia 10/05/2023   GBS (Guillain Barre syndrome) 08/09/2023   Lumbar radiculopathy 01/01/2023   CIDP (chronic inflammatory demyelinating polyneuropathy) (HCC) 12/11/2022   Acute sensory neuropathy 10/25/2022   B12 deficiency 10/18/2022   Gait abnormality 10/18/2022   Pre-diabetes 09/25/2022   AAA (abdominal  aortic aneurysm) 09/24/2022   Essential hypertension 09/24/2022   Iron deficiency 04/04/2022   Hypertriglyceridemia 04/03/2022   Coronary artery calcification 01/18/2022   Candidal intertrigo    Emphysema of lung (HCC) 07/26/2021   Aortic atherosclerosis 07/26/2021   Internal hemorrhoids 01/23/2021   Diverticula of colon 01/23/2021   Eczema 01/23/2021   Actinic keratoses 01/23/2021   Tobacco use disorder, mild, abuse 09/02/2020   Past Surgical History:  Procedure Laterality Date   FOOT SURGERY Right    HERNIA REPAIR Right    IR FLUORO GUIDE CV LINE RIGHT  10/25/2022   IR US  GUIDE VASC ACCESS RIGHT  10/25/2022   KNEE ARTHROSCOPY Left    WISDOM TOOTH EXTRACTION     Family History  Problem Relation Age of Onset   Stroke Mother 23   AAA (abdominal aortic aneurysm) Mother    Intellectual disability Mother    Cancer Father        Prostate   Hearing loss Father    Colon cancer Neg Hx    Colon polyps Neg Hx    Esophageal cancer Neg Hx    Rectal cancer Neg Hx    Stomach cancer Neg Hx    Outpatient Medications Prior to Visit  Medication Sig Dispense Refill   acetaminophen  (TYLENOL ) 325 MG tablet Take 1-2 tablets (325-650 mg total) by mouth every 4 (four) hours as needed for mild pain.     amLODipine -olmesartan  (AZOR ) 5-20 MG tablet Take 1 tablet by mouth daily. 90 tablet 3   aspirin  EC 81 MG tablet  Take 81 mg by mouth in the morning.     gabapentin  (NEURONTIN ) 300 MG capsule Take 1 capsule (300 mg total) by mouth 3 (three) times daily. 90 capsule 11   ibuprofen (ADVIL) 200 MG tablet Take 400 mg by mouth daily as needed for headache or mild pain.     Omega-3 Fatty Acids (FISH OIL PO) Take 1 capsule by mouth daily with lunch. When able to remember     rosuvastatin  (CRESTOR ) 5 MG tablet Take 1 tablet (5 mg total) by mouth daily. 90 tablet 3   sildenafil  (VIAGRA ) 100 MG tablet TAKE 1/2 TO 1 TABLET BY MOUTH DAILY AS NEEDED FOR ED 30 tablet 11   TOLAK 4 % CREA Apply topically daily.      Turmeric (QC TUMERIC COMPLEX) 500 MG CAPS Take by mouth.     immune globulin ,gamma, IGG, (BAYGAM) injection Inject into the muscle. (Patient not taking: Reported on 09/09/2024)     No facility-administered medications prior to visit.   Allergies  Allergen Reactions   Hydrochlorothiazide Rash    Photodermatitis   Objective:   Today's Vitals   09/09/24 1010  BP: 122/64  Pulse: 66  Temp: 97.7 F (36.5 C)  TempSrc: Temporal  SpO2: 95%  Weight: 219 lb 12.8 oz (99.7 kg)  Height: 5' 10 (1.778 m)   Body mass index is 31.54 kg/m.   General: Well developed, well nourished. No acute distress. Neuro: Able to get to stand without assistance from his hands. Psych: Alert and oriented. Normal mood and affect.  Health Maintenance Due  Topic Date Due   Hepatitis C Screening  Never done     Assessment & Plan:   Problem List Items Addressed This Visit       Cardiovascular and Mediastinum   Essential hypertension - Primary   Blood pressure is in good control. Continue olmesartan -amlodipine  20-5 mg daily.        Respiratory   Emphysema of lung (HCC)   Stable. No signs of exacerbation. Ongoing tobacco use is a concern. Have advised Joe Greene to quit and offered assistance. He is not ready to take action on this.        Nervous and Auditory   CIDP (chronic inflammatory demyelinating polyneuropathy) (HCC)   Now off of IVIG. We will monitor for any recurrence of symptoms. Continue gabapentin  300 mg bid.        Other   Dyslipidemia   Stable. Continue rosuvastatin  5 mg daily.      Tobacco use disorder, mild, abuse   I recommend he stop use of tobacco. As noted above, he is not ready for this step.       Return in about 3 months (around 12/10/2024) for Reassessment, fasting labs.   Garnette CHRISTELLA Simpler, MD

## 2024-09-09 NOTE — Assessment & Plan Note (Signed)
 Stable. No signs of exacerbation. Ongoing tobacco use is a concern. Have advised Joe Greene to quit and offered assistance. He is not ready to take action on this.

## 2024-09-09 NOTE — Assessment & Plan Note (Addendum)
 Now off of IVIG. We will monitor for any recurrence of symptoms. Continue gabapentin  300 mg bid.

## 2024-09-10 DIAGNOSIS — E669 Obesity, unspecified: Secondary | ICD-10-CM | POA: Diagnosis not present

## 2024-09-10 DIAGNOSIS — G629 Polyneuropathy, unspecified: Secondary | ICD-10-CM | POA: Diagnosis not present

## 2024-09-10 DIAGNOSIS — L309 Dermatitis, unspecified: Secondary | ICD-10-CM | POA: Diagnosis not present

## 2024-09-10 DIAGNOSIS — G6181 Chronic inflammatory demyelinating polyneuritis: Secondary | ICD-10-CM | POA: Diagnosis not present

## 2024-09-10 DIAGNOSIS — I10 Essential (primary) hypertension: Secondary | ICD-10-CM | POA: Diagnosis not present

## 2024-09-10 DIAGNOSIS — Z8249 Family history of ischemic heart disease and other diseases of the circulatory system: Secondary | ICD-10-CM | POA: Diagnosis not present

## 2024-09-10 DIAGNOSIS — E785 Hyperlipidemia, unspecified: Secondary | ICD-10-CM | POA: Diagnosis not present

## 2024-09-10 DIAGNOSIS — C44629 Squamous cell carcinoma of skin of left upper limb, including shoulder: Secondary | ICD-10-CM | POA: Diagnosis not present

## 2024-09-10 DIAGNOSIS — Z7982 Long term (current) use of aspirin: Secondary | ICD-10-CM | POA: Diagnosis not present

## 2024-11-16 ENCOUNTER — Ambulatory Visit: Payer: Self-pay | Admitting: Family Medicine

## 2024-11-16 ENCOUNTER — Encounter: Payer: Self-pay | Admitting: Family Medicine

## 2024-11-16 ENCOUNTER — Ambulatory Visit: Admitting: Family Medicine

## 2024-11-16 VITALS — BP 130/66 | HR 65 | Temp 97.9°F | Ht 70.0 in | Wt 216.4 lb

## 2024-11-16 DIAGNOSIS — E785 Hyperlipidemia, unspecified: Secondary | ICD-10-CM | POA: Diagnosis not present

## 2024-11-16 DIAGNOSIS — I7143 Infrarenal abdominal aortic aneurysm, without rupture: Secondary | ICD-10-CM

## 2024-11-16 DIAGNOSIS — E538 Deficiency of other specified B group vitamins: Secondary | ICD-10-CM | POA: Diagnosis not present

## 2024-11-16 DIAGNOSIS — I10 Essential (primary) hypertension: Secondary | ICD-10-CM

## 2024-11-16 DIAGNOSIS — G6181 Chronic inflammatory demyelinating polyneuritis: Secondary | ICD-10-CM | POA: Diagnosis not present

## 2024-11-16 LAB — BASIC METABOLIC PANEL WITH GFR
BUN: 20 mg/dL (ref 6–23)
CO2: 21 meq/L (ref 19–32)
Calcium: 9 mg/dL (ref 8.4–10.5)
Chloride: 105 meq/L (ref 96–112)
Creatinine, Ser: 0.87 mg/dL (ref 0.40–1.50)
GFR: 85.34 mL/min
Glucose, Bld: 127 mg/dL — ABNORMAL HIGH (ref 70–99)
Potassium: 4.5 meq/L (ref 3.5–5.1)
Sodium: 136 meq/L (ref 135–145)

## 2024-11-16 LAB — LIPID PANEL
Cholesterol: 131 mg/dL (ref 28–200)
HDL: 28.2 mg/dL — ABNORMAL LOW
LDL Cholesterol: 33 mg/dL (ref 10–99)
NonHDL: 103.16
Total CHOL/HDL Ratio: 5
Triglycerides: 351 mg/dL — ABNORMAL HIGH (ref 10.0–149.0)
VLDL: 70.2 mg/dL — ABNORMAL HIGH (ref 0.0–40.0)

## 2024-11-16 LAB — FOLATE: Folate: >23.7 ng/mL

## 2024-11-16 LAB — VITAMIN B12: Vitamin B-12: 273 pg/mL (ref 211–911)

## 2024-11-16 NOTE — Assessment & Plan Note (Signed)
 I will check lipids today. Continue rosuvastatin  5 mg daily.

## 2024-11-16 NOTE — Assessment & Plan Note (Signed)
 Due for repeat scan to monitor AAA.

## 2024-11-16 NOTE — Assessment & Plan Note (Signed)
 Blood pressure is in good control. Continue olmesartan -amlodipine  20-5 mg daily.

## 2024-11-16 NOTE — Progress Notes (Signed)
 " [facility] [address] [place] [address] Dept: [phone] Dept Fax: [phone]  Chronic Care Office Visit  Subjective:    Patient ID: Joe Greene, male    DOB: March 03, 1951, 74 y.o..   MRN: [id number]  Chief Complaint  Patient presents with   Hypertension    3 month f/u HTN.  C/o having back pain.  Has been taking Ibuprofen.    History of Present Illness:  Patient is in today for reassessment of chronic medical issues.  Mr. Risden has a history of hypertension. He is managed on amlodipine -olmesartan  (Azor ) 5-20 mg daily.   Mr. Packard has a history of an abdominal aortic aneurysm. He is having a CT scan every 6 months to monitor this. He is managed on rosuvastatin  5 mg daily. He continues to smoke cigars.   Mr. 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 This condition started for him initially on Nov. 9, 2023, following a URI about 2 weeks earlier. He had been treated with IVIG. He and his neurologist agreed to stop these treatments in early Sept. He feels his legs fatigue easily. This may be worsening since stopping the IG. Mr. Chmiel restarted gabapentin  600 mg BID about a week ago for neuropathic symptoms.   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   History of squamous cell carcinoma in situ (SCCIS) of skin 05/04/2024   Dyslipidemia 10/05/2023   GBS (Guillain Barre syndrome) 08/09/2023   Lumbar radiculopathy 01/01/2023   CIDP (chronic inflammatory demyelinating polyneuropathy) (HCC) 12/11/2022   Acute sensory neuropathy 10/25/2022   B12 deficiency 10/18/2022   Gait abnormality 10/18/2022   Pre-diabetes 09/25/2022   AAA (abdominal aortic aneurysm) 09/24/2022   Essential hypertension 09/24/2022   Iron deficiency 04/04/2022    Hypertriglyceridemia 04/03/2022   Coronary artery calcification 01/18/2022   Candidal intertrigo    Emphysema of lung (HCC) 07/26/2021   Aortic atherosclerosis 07/26/2021   Internal hemorrhoids 01/23/2021   Diverticula of colon 01/23/2021   Eczema 01/23/2021   Actinic keratoses 01/23/2021   Tobacco use disorder, mild, abuse 09/02/2020   Past Surgical History:  Procedure Laterality Date   FOOT SURGERY Right    HERNIA REPAIR Right    IR FLUORO GUIDE CV LINE RIGHT  10/25/2022   IR US  GUIDE VASC ACCESS RIGHT  10/25/2022   KNEE ARTHROSCOPY Left    WISDOM TOOTH EXTRACTION     Family History  Problem Relation Age of Onset   Stroke Mother 9   AAA (abdominal aortic aneurysm) Mother    Intellectual disability Mother    Cancer Father        Prostate   Hearing loss Father    Colon cancer Neg Hx    Colon polyps Neg Hx    Esophageal cancer Neg Hx    Rectal cancer Neg Hx    Stomach cancer Neg Hx    Outpatient Medications Prior to Visit  Medication Sig Dispense Refill   amLODipine -olmesartan  (AZOR ) 5-20 MG tablet Take 1 tablet by mouth daily. 90 tablet 3   aspirin  EC 81 MG tablet Take 81 mg by mouth in the morning.     gabapentin  (NEURONTIN ) 300 MG capsule Take 1 capsule (300 mg total) by mouth 3 (three) times daily. 90 capsule 11   ibuprofen (ADVIL) 200 MG tablet Take 400 mg by mouth daily as  needed for headache or mild pain.     Omega-3 Fatty Acids (FISH OIL PO) Take 1 capsule by mouth daily with lunch. When able to remember     rosuvastatin  (CRESTOR ) 5 MG tablet Take 1 tablet (5 mg total) by mouth daily. 90 tablet 3   sildenafil  (VIAGRA ) 100 MG tablet TAKE 1/2 TO 1 TABLET BY MOUTH DAILY AS NEEDED FOR ED 30 tablet 11   TOLAK 4 % CREA Apply topically daily.     acetaminophen  (TYLENOL ) 325 MG tablet Take 1-2 tablets (325-650 mg total) by mouth every 4 (four) hours as needed for mild pain. (Patient not taking: Reported on 11/16/2024)     Turmeric (QC TUMERIC COMPLEX) 500 MG CAPS Take by  mouth. (Patient not taking: Reported on 11/16/2024)     No facility-administered medications prior to visit.   Allergies[1] Objective:   Today's Vitals   11/16/24 1015  BP: 130/66  Pulse: 65  Temp: 97.9 F (36.6 C)  TempSrc: Temporal  SpO2: 95%  Weight: 216 lb 6.4 oz (98.2 kg)  Height: 5' 10 (1.778 m)   Body mass index is 31.05 kg/m.   General: Well developed, well nourished. No acute distress. Neuro: Can get to stand without use of hands.  Psych: Alert and oriented. Normal mood and affect.  Health Maintenance Due  Topic Date Due   Hepatitis C Screening  Never done     Assessment & Plan:   Problem List Items Addressed This Visit       Cardiovascular and Mediastinum   AAA (abdominal aortic aneurysm) (Chronic)   Due for repeat scan to monitor AAA.      Relevant Orders   CT ABDOMEN PELVIS WO CONTRAST   Essential hypertension - Primary   Blood pressure is in good control. Continue olmesartan -amlodipine  20-5 mg daily.      Relevant Orders   Basic metabolic panel with GFR     Nervous and Auditory   CIDP (chronic inflammatory demyelinating polyneuropathy) (HCC)   Now off of IVIG. Continue gabapentin  300 mg bid for neuropathy symptoms. May be having some return of muscle weakness/fatigability. Recommend he discuss this with Dr. Onita.        Other   B12 deficiency   Has stopped taking Vit. B12. I will recheck his levels to make sure progressive neurological symptoms are not due to low B12.      Relevant Orders   Vitamin B12   Folate   Dyslipidemia   I will check lipids today. Continue rosuvastatin  5 mg daily.      Relevant Orders   Lipid panel    Return in about 3 months (around 02/14/2025) for Reassessment.   Garnette CHRISTELLA Simpler, MD    [1]  Allergies Allergen Reactions   Hydrochlorothiazide Rash    Photodermatitis   "

## 2024-11-16 NOTE — Assessment & Plan Note (Signed)
 Has stopped taking Vit. B12. I will recheck his levels to make sure progressive neurological symptoms are not due to low B12.

## 2024-11-16 NOTE — Assessment & Plan Note (Signed)
 Now off of IVIG. Continue gabapentin  300 mg bid for neuropathy symptoms. May be having some return of muscle weakness/fatigability. Recommend he discuss this with Dr. Onita.

## 2024-11-23 ENCOUNTER — Encounter: Payer: Self-pay | Admitting: Family Medicine

## 2024-11-23 NOTE — Progress Notes (Unsigned)
 " Office Note     CC:  follow up Requesting Provider:  Thedora Garnette HERO, MD  HPI: Joe Greene is a 74 y.o. (10-Jun-1951) male who presents for surveillance follow up of AAA. We have been following his 4.9 cm aneurysm. He has not had any associated symptoms. He does have some restless legs. And he will get fatigue in his legs after prolonged standing/ambulatiohn. He does not have any pain in his legs on ambulation or rest. No tissue loss.    He is medically managed on Aspirin  and Statin. He takes Olmesartan -Amlodipine -HCTZ combination now for HTN. He is a former smoker. Currently has returned to smoking cigars  Past Medical History:  Diagnosis Date   AAA (abdominal aortic aneurysm)    Cataract    forming    Emphysema of lung (HCC)    Guillain Barr syndrome     Past Surgical History:  Procedure Laterality Date   FOOT SURGERY Right    HERNIA REPAIR Right    IR FLUORO GUIDE CV LINE RIGHT  10/25/2022   IR US  GUIDE VASC ACCESS RIGHT  10/25/2022   KNEE ARTHROSCOPY Left    WISDOM TOOTH EXTRACTION      Social History   Socioeconomic History   Marital status: Married    Spouse name: Maanav Kassabian   Number of children: 2   Years of education: Not on file   Highest education level: Doctorate  Occupational History   Occupation: Retired    Comment: Scientist, Research (medical)- [employer]  Tobacco Use   Smoking status: Every Day    Types: Cigars    Start date: 06/21/2023   Smokeless tobacco: Never  Vaping Use   Vaping status: Never Used  Substance and Sexual Activity   Alcohol use: Yes    Alcohol/week: 3.0 standard drinks of alcohol    Types: 3 Standard drinks or equivalent per week    Comment: 2 or 3 drinks per week   Drug use: Never   Sexual activity: Yes  Other Topics Concern   Not on file  Social History Narrative   Not on file   Social Drivers of Health   Tobacco Use: High Risk (11/16/2024)   Patient History    Smoking Tobacco Use: Every Day    Smokeless Tobacco Use: Never     Passive Exposure: Not on file  Financial Resource Strain: Low Risk (11/13/2024)   Overall Financial Resource Strain (CARDIA)    Difficulty of Paying Living Expenses: Not hard at all  Food Insecurity: No Food Insecurity (11/13/2024)   Epic    Worried About Programme Researcher, Broadcasting/film/video in the Last Year: Never true    Ran Out of Food in the Last Year: Never true  Transportation Needs: No Transportation Needs (11/13/2024)   Epic    Lack of Transportation (Medical): No    Lack of Transportation (Non-Medical): No  Physical Activity: Insufficiently Active (11/13/2024)   Exercise Vital Sign    Days of Exercise per Week: 3 days    Minutes of Exercise per Session: 40 min  Stress: No Stress Concern Present (11/13/2024)   [employer] of Occupational Health - Occupational Stress Questionnaire    Feeling of Stress: Not at all  Social Connections: Moderately Integrated (11/13/2024)   Social Connection and Isolation Panel    Frequency of Communication with Friends and Family: Once a week    Frequency of Social Gatherings with Friends and Family: Once a week    Attends Religious Services: More than 4  times per year    Active Member of Clubs or Organizations: Yes    Attends Banker Meetings: More than 4 times per year    Marital Status: Married  Catering Manager Violence: Not At Risk (01/24/2024)   Humiliation, Afraid, Rape, and Kick questionnaire    Fear of Current or Ex-Partner: No    Emotionally Abused: No    Physically Abused: No    Sexually Abused: No  Depression (PHQ2-9): Low Risk (01/24/2024)   Depression (PHQ2-9)    PHQ-2 Score: 0  Alcohol Screen: Low Risk (11/13/2024)   Alcohol Screen    Last Alcohol Screening Score (AUDIT): 2  Housing: Low Risk (11/13/2024)   Epic    Unable to Pay for Housing in the Last Year: No    Number of Times Moved in the Last Year: 0    Homeless in the Last Year: No  Utilities: Not At Risk (01/24/2024)   AHC Utilities    Threatened with loss of utilities: No   Health Literacy: Adequate Health Literacy (01/24/2024)   B1300 Health Literacy    Frequency of need for help with medical instructions: Never    Family History  Problem Relation Age of Onset   Stroke Mother 14   AAA (abdominal aortic aneurysm) Mother    Intellectual disability Mother    Cancer Father        Prostate   Hearing loss Father    Colon cancer Neg Hx    Colon polyps Neg Hx    Esophageal cancer Neg Hx    Rectal cancer Neg Hx    Stomach cancer Neg Hx     Current Outpatient Medications  Medication Sig Dispense Refill   amLODipine -olmesartan  (AZOR ) 5-20 MG tablet Take 1 tablet by mouth daily. 90 tablet 3   aspirin  EC 81 MG tablet Take 81 mg by mouth in the morning.     gabapentin  (NEURONTIN ) 300 MG capsule Take 1 capsule (300 mg total) by mouth 3 (three) times daily. 90 capsule 11   ibuprofen (ADVIL) 200 MG tablet Take 400 mg by mouth daily as needed for headache or mild pain.     Omega-3 Fatty Acids (FISH OIL PO) Take 1 capsule by mouth daily with lunch. When able to remember     rosuvastatin  (CRESTOR ) 5 MG tablet Take 1 tablet (5 mg total) by mouth daily. 90 tablet 3   sildenafil  (VIAGRA ) 100 MG tablet TAKE 1/2 TO 1 TABLET BY MOUTH DAILY AS NEEDED FOR ED 30 tablet 11   No current facility-administered medications for this visit.    Allergies[1]   REVIEW OF SYSTEMS:  Negative unless noted in HPI [X]  denotes positive finding, [ ]  denotes negative finding Cardiac  Comments:  Chest pain or chest pressure:    Shortness of breath upon exertion:    Short of breath when lying flat:    Irregular heart rhythm:        Vascular    Pain in calf, thigh, or hip brought on by ambulation:    Pain in feet at night that wakes you up from your sleep:     Blood clot in your veins:    Leg swelling:         Pulmonary    Oxygen at home:    Productive cough:     Wheezing:         Neurologic    Sudden weakness in arms or legs:     Sudden numbness in arms or legs:  Sudden  onset of difficulty speaking or slurred speech:    Temporary loss of vision in one eye:     Problems with dizziness:         Gastrointestinal    Blood in stool:     Vomited blood:         Genitourinary    Burning when urinating:     Blood in urine:        Psychiatric    Major depression:         Hematologic    Bleeding problems:    Problems with blood clotting too easily:        Skin    Rashes or ulcers:        Constitutional    Fever or chills:      PHYSICAL EXAMINATION:  There were no vitals filed for this visit.***  General:  WDWN in NAD; vital signs documented above Gait: Not observed HENT: WNL, normocephalic Pulmonary: normal non-labored breathing Cardiac: {Desc; regular/irreg:14544} HR Abdomen: soft Vascular Exam/Pulses: *** Extremities: {With/Without:20273} ischemic changes, {With/Without:20273} Gangrene , {With/Without:20273} cellulitis; {With/Without:20273} open wounds;  Musculoskeletal: no muscle wasting or atrophy  Neurologic: A&O X 3 Psychiatric:  The pt has Normal affect.   Non-Invasive Vascular Imaging:   ***    ASSESSMENT/PLAN:: 74 y.o. male here for follow up for ***   -***   Teretha Damme, PA-C [facility] [phone]  Clinic MD:   Sheree    [1]  Allergies Allergen Reactions   Hydrochlorothiazide Rash    Photodermatitis   "

## 2024-11-25 ENCOUNTER — Ambulatory Visit: Admitting: Family Medicine

## 2024-11-25 ENCOUNTER — Ambulatory Visit (HOSPITAL_COMMUNITY)
Admission: RE | Admit: 2024-11-25 | Discharge: 2024-11-25 | Disposition: A | Discharge status: Home / Self Care | Source: Ambulatory Visit | Attending: Physician Assistant | Admitting: Physician Assistant

## 2024-11-25 ENCOUNTER — Encounter: Payer: Self-pay | Admitting: Neurology

## 2024-11-25 ENCOUNTER — Ambulatory Visit: Admitting: Physician Assistant

## 2024-11-25 VITALS — BP 123/73 | HR 65 | Temp 98.2°F | Wt 215.0 lb

## 2024-11-25 DIAGNOSIS — I7143 Infrarenal abdominal aortic aneurysm, without rupture: Secondary | ICD-10-CM | POA: Insufficient documentation

## 2024-11-26 ENCOUNTER — Other Ambulatory Visit: Payer: Self-pay | Admitting: *Deleted

## 2024-11-26 DIAGNOSIS — I7143 Infrarenal abdominal aortic aneurysm, without rupture: Secondary | ICD-10-CM

## 2024-11-26 NOTE — Telephone Encounter (Signed)
 Put him on schedule with me or NP soon to be evaluated first before consider restarting IVIG.

## 2024-11-30 NOTE — Telephone Encounter (Signed)
 Please schedule with Dr. Onita to determine if restart of IVIG is needed based on her exam as I am not overly familiar with CIDP. Also, her prior note stated If he has worsening symptoms, may consider MRI of lumbar, brisk upper extremity and patellar reflex with possible Babinski signs, previous cervical x-ray showed multilevel degenerative changes, may also consider EMG nerve conduction study. I would prefer her to determine if IVIG is needed and/or other work up indicated. Thank you.

## 2024-12-01 ENCOUNTER — Ambulatory Visit: Admitting: Neurology

## 2024-12-01 ENCOUNTER — Encounter: Payer: Self-pay | Admitting: Neurology

## 2024-12-01 VITALS — BP 115/65 | HR 69 | Wt 216.0 lb

## 2024-12-01 DIAGNOSIS — G6181 Chronic inflammatory demyelinating polyneuritis: Secondary | ICD-10-CM | POA: Diagnosis not present

## 2024-12-01 NOTE — Progress Notes (Signed)
 "   Chief Complaint  Patient presents with   Follow-up    Pt in EMG room 3. Wife in room. Here to discuss restarting IVIG infusion.     ASSESSMENT AND PLAN  Joe Greene is a 74 y.o. male   History of chronic inflammatory demyelinating polyradiculoneuropathy  Started since early November 2023 following upper respiratory infection, loss of taste 2 weeks prior to the symptom onset  mild to moderate improvement following IVIG from November 14-18 2023, began to have worsening symptoms again since early December 2023, hospital admission again on Dec 14 for plasma Change, did help his symptoms, following that, at his best, he can ambulate without assistance, Hospital admission again on January 4th 2024, IVIG 2 g/kg completed on January 8, Outpatient patient IVIG, 2 g/kg loading dose on the week of December 10, 2022, then 1 g/kg every 3 weeks, his symptoms began to improve steadily and now stable.  Stopped IVIG since September 2025, continues to function well,  If he has worsening symptoms, may consider MRI of lumbar,  previous cervical x-ray showed multilevel degenerative changes, may also consider EMG nerve conduction study  DIAGNOSTIC DATA (LABS, IMAGING, TESTING) - I reviewed patient records, labs, notes, testing and imaging myself where available.  EMG/NCV 10/24/2022 Conclusion: This is an abnormal study.  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  EMG/NCV 08/09/2023 Conclusion: This is an abnormal study.  There is continued evidence of patchy demyelinating polyradiculoneuropathy, as evident by left sural sensory spare, prolonged left median, ulnar F-wave latencies.  Compared to previous study in December 2023, there is significant improvement.   MEDICAL HISTORY:  Joe Greene, is a 74 year old male seen in request by his primary care physician Dr. 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 PMHX HTN Smoker, 2ppd, quit since Sep 23 2022.  Patient has been active all his life, began to noticed toes and fingertips numbness tingling on September 17, 2022, prior to that, he has suffered upper respiratory infection for 2 weeks, cough, loss of taste,  By November 10, he noticed progressive numbness upper and lower extremity, and also developed gait abnormality,  Symptoms continue to progressively getting worse, leading to emergency room presentation September 24, 2022, also had intractable headache,  He was admitted with diagnosis of probable Guillain-Barr, lumbar puncture September 25, 2022 showed wbc, 4, RBC 0,  TP 89, Glucose 80,   He had 5 days of IVIG, began to notice improvement shortly afterwards, continue to make progress, to the point he can ambulate without assistance at home for few days, but since early December, he noticed regression, increased numbness, gait abnormality,   Update October 24, 2022: Patient returns for electrodiagnostic study today, which confirmed acute demyelinating polyradiculoneuropathy, there was no evidence of significant axonal loss He had symptom onset early November, received IVIG from November 14 for 5 consecutive days, he began to notice an after second and third dose, the benefit lasted about 2 weeks, at his best, he could take few steps at home without assistant,  Since beginning of December 2023, 2 weeks after IVIG infusion, he began to notice worsening bilateral lower extremity ascending numbness, weakness, now could barely take a step, sitting in wheelchair most of the time, also frequent shortness of breath episode, sometimes waking up in the middle of the night  Also  complains of difficulty emptying his bladder, urinary frequency, baseline elevated heart rate, consistent with autonomic  involvement  We have attempted outpatient IVIG infusion, but due to insurance and preauthorization limitations, it needed few weeks to complete the process.  With his worsening symptoms, especially autonomic involvement, shortness of breath, continue progress of symptoms, per patient even worse than prior to treatment level in the middle of November, will refer him to hospital for either repeat IVIG or even consider plasma exchange  UPDATE Nov 21 2022: He was admitted to hospital following his electrodiagnostic study because of his complaints of worsening ascending paresthesia, gait abnormality  Central line placed on 12/14 he was started on plasma exchange, 5 rounds, every other day, with improvement in symptoms.  He completed  NIF-32 and vital capacity 2.7 L.  Then was discharged to inpatient rehabilitation on November 02, 2022, which continues improvement, at his best, he can ambulate without assistance  Shortly after he was discharged home on November 10, 2022, he noticed regression, hospital admission again on January 4th, IVIG 2 g/kg Completed on January 8th 2023, this round, he only noticed mild improvement  Discharge neurological examination showed normal upper extremity strength, mild distal upper extremity, hip flexion, distal leg weakness,  Patient complains of continued lower extremity paresthesia, weakness, gait abnormality, fingertips paresthesia, loss of dexterity of his fingers  He denies swallowing difficulty, breathing difficulty, no significant autonomic symptoms  UPDATE Dec 11 2022 Dr. Onita: Patient is accompanied by his wife at today's clinical visit, continue worsening lower extremity weakness, increased gait abnormality about 2 to 3 weeks following previous IVIG infusion, started outpatient IVIG loading 2 g/kg since December 10, 2021, tolerating infusion well  He complains of slow worsening low back pain, radiating pain to left hip, lateral thigh, paresthesia, increased  after bearing weight, feels very discouraged that he has recurrent symptoms even with frequent IVIG treatment, also complains of intermittent upper extremity paresthesia, extending to above wrist level  UPDATE Feb 202 24 Dr. Onita: He is accompanied his wife at today's visit, overall doing better, continue have low back pain radiating pain to left hip, intermittent bilateral lower extremity paresthesia, unsteady gait uses cane, but overall has much improved, IVIG every 3 weeks has been helpful, last infusion was on February 19 and 20  Personally reviewed MRI of lumbar December 29, 2022, multilevel degenerative changes, prominent disc and facet degenerative disease most prominent at L4-5, severe bilateral foraminal narrowing, L3-4 moderate bilateral foraminal narrowing  He denies bowel or bladder incontinence, intermittent bilateral hands paresthesia  Left hip x-ray showed no significant abnormality  He tolerated Cymbalta  30 mg,   UPDATE Sept 27th 2024:  He is overall doing very well, gait back to his baseline, continued IVIG 1 g/kg body recent few weeks, he has intermittent worsening bilateral feet numbness tingling, especially after bearing weight, taking Cymbalta  60 mg every morning gabapentin  300 mg 3 to 4 tablets each day, complains of difficulty sleeping sometimes,  UPDATE Sept 23 2025: He is accompanied by his wife at today's clinical visit, overall doing very well, has been receiving IVIG 1 g/kg every 3 weeks, off Cymbalta  now, taking gabapentin  300 mg 3 times as needed for lower extremity achy pain, he is very active at farm, complains of lower extremity fatigue easily, denies significant low back pain, no bowel and bladder incontinence, intermittent hands paresthesia  EMG/NCV 07/2023 continued evidence of patchy demyelinating polyradiculoneuropathy but overall significant improvement compared to prior study in 10/2022, there was also evidence of  left carpal tunnel syndrome.  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  PHYSICAL EXAMNIATION:  Gen: NAD, pleasant elderly Caucasian male, conversant, well nourised, well groomed                     Cardiovascular: Regular rate rhythm, no peripheral edema, warm, nontender. Eyes: Conjunctivae clear without exudates or hemorrhage Neck: Supple, no carotid bruits. Pulmonary: Clear to auscultation bilaterally   NEUROLOGICAL EXAM:  MENTAL STATUS: Speech/cognition: Awake, alert, oriented to history taking and casual conversation, normal breathing CRANIAL NERVES: CN II: Visual fields are full to confrontation. Pupils are round equal and briskly reactive to light. CN III, IV, VI: extraocular movement are normal. No ptosis. CN V: Facial sensation is intact to light touch CN VII: Face is symmetric with normal eye closure  CN VIII: Hearing is normal to causal conversation. CN IX, X: Phonation is normal. CN XI: Head turning and shoulder shrug are intact  MOTOR: He has no significant bilateral upper and lower extremity muscle weakness   REFLEXES: Areflexia  SENSORY:  Length-dependent decreased light touch, pinprick sensation and vibratory sensation to ankle level  COORDINATION: There is no trunk or limb dysmetria noted.  GAIT/STANCE: Getting up from a chair arm crossed, steady, negative Romberg sign   REVIEW OF SYSTEMS:  Full 14 system  review of systems performed and notable only for as above All other review of systems were negative.   ALLERGIES: Allergies  Allergen Reactions   Hydrochlorothiazide Rash    Photodermatitis    HOME MEDICATIONS: Current Outpatient Medications  Medication Sig Dispense Refill   amLODipine -olmesartan  (AZOR ) 5-20 MG tablet Take 1 tablet by mouth daily. 90 tablet 3   aspirin  EC 81 MG tablet Take 81 mg by mouth in the morning.     gabapentin  (NEURONTIN ) 300 MG capsule Take 1 capsule (300 mg total) by mouth 3 (three) times daily. 90 capsule 11   ibuprofen (ADVIL) 200 MG tablet Take 400 mg by mouth daily as needed for headache or mild pain.     Omega-3 Fatty Acids (FISH OIL PO) Take 1 capsule by mouth daily with lunch. When able to remember     rosuvastatin  (CRESTOR ) 5 MG tablet Take 1 tablet (5 mg total) by mouth daily. 90 tablet 3   sildenafil  (VIAGRA ) 100 MG tablet TAKE 1/2 TO 1 TABLET BY MOUTH DAILY AS NEEDED FOR ED 30 tablet 11   No current facility-administered medications for this visit.    PAST MEDICAL HISTORY: Past Medical History:  Diagnosis Date   AAA (abdominal aortic aneurysm)    Cataract    forming    Emphysema of lung (HCC)    Guillain Barr syndrome     PAST SURGICAL HISTORY: Past Surgical History:  Procedure Laterality Date   FOOT SURGERY Right    HERNIA  REPAIR Right    IR FLUORO GUIDE CV LINE RIGHT  10/25/2022   IR US  GUIDE VASC ACCESS RIGHT  10/25/2022   KNEE ARTHROSCOPY Left    WISDOM TOOTH EXTRACTION      FAMILY HISTORY: Family History  Problem Relation Age of Onset   Stroke Mother 25   AAA (abdominal aortic aneurysm) Mother    Intellectual disability Mother    Cancer Father        Prostate   Hearing loss Father    Colon cancer Neg Hx    Colon polyps Neg Hx    Esophageal cancer Neg Hx    Rectal cancer Neg Hx    Stomach cancer Neg Hx     SOCIAL HISTORY: Social History   Socioeconomic History   Marital status: Married    Spouse name: Miroslav Gin   Number of children: 2   Years of education: Not on file   Highest education level: Doctorate  Occupational History   Occupation: Retired    Comment: Scientist, Research (medical)- [employer]  Tobacco Use   Smoking status: Every Day    Types: Cigars    Start date: 06/21/2023   Smokeless tobacco: Never  Vaping Use   Vaping status: Never Used  Substance and Sexual Activity   Alcohol use: Yes    Alcohol/week: 3.0 standard drinks of alcohol    Types: 3 Standard drinks or equivalent per week    Comment: 2 or 3 drinks per week   Drug use: Never   Sexual activity: Yes  Other Topics Concern   Not on file  Social History Narrative   Not on file   Social Drivers of Health   Tobacco Use: High Risk (12/01/2024)   Patient History    Smoking Tobacco Use: Every Day    Smokeless Tobacco Use: Never    Passive Exposure: Not on file  Financial Resource Strain: Low Risk (11/13/2024)   Overall Financial Resource Strain (CARDIA)    Difficulty of Paying Living Expenses: Not hard at all  Food Insecurity: No Food Insecurity (11/13/2024)   Epic    Worried About Programme Researcher, Broadcasting/film/video in the Last Year: Never true    Ran Out of Food in the Last Year: Never true  Transportation Needs: No Transportation Needs (11/13/2024)   Epic    Lack of Transportation (Medical): No    Lack of Transportation (Non-Medical): No  Physical Activity: Insufficiently Active (11/13/2024)   Exercise Vital Sign    Days of Exercise per Week: 3 days    Minutes of Exercise per Session: 40 min  Stress: No Stress Concern Present (11/13/2024)   [employer] of Occupational Health - Occupational Stress Questionnaire    Feeling of Stress: Not at all  Social Connections: Moderately Integrated (11/13/2024)   Social Connection and Isolation Panel    Frequency of Communication with Friends and Family: Once a week    Frequency of Social Gatherings with Friends and Family: Once a week    Attends Religious Services: More than 4 times per year     Active Member of [employer] or Organizations: Yes    Attends Banker Meetings: More than 4 times per year    Marital Status: Married  Catering Manager Violence: Not At Risk (01/24/2024)   Humiliation, Afraid, Rape, and Kick questionnaire    Fear of Current or Ex-Partner: No    Emotionally Abused: No    Physically Abused: No    Sexually Abused: No  Depression (PHQ2-9):  Low Risk (01/24/2024)   Depression (PHQ2-9)    PHQ-2 Score: 0  Alcohol Screen: Low Risk (11/13/2024)   Alcohol Screen    Last Alcohol Screening Score (AUDIT): 2  Housing: Low Risk (11/13/2024)   Epic    Unable to Pay for Housing in the Last Year: No    Number of Times Moved in the Last Year: 0    Homeless in the Last Year: No  Utilities: Not At Risk (01/24/2024)   [facility] Utilities    Threatened with loss of utilities: No  Health Literacy: Adequate Health Literacy (01/24/2024)   B1300 Health Literacy    Frequency of need for help with medical instructions: Never   Modena Callander. M.D. Ph.D.  [facility]

## 2024-12-02 ENCOUNTER — Other Ambulatory Visit

## 2025-01-21 ENCOUNTER — Other Ambulatory Visit

## 2025-01-25 ENCOUNTER — Ambulatory Visit

## 2025-02-16 ENCOUNTER — Ambulatory Visit: Admitting: Family Medicine

## 2025-06-02 ENCOUNTER — Ambulatory Visit

## 2025-06-02 ENCOUNTER — Ambulatory Visit (HOSPITAL_COMMUNITY)
